# Patient Record
Sex: Male | Born: 1950 | Race: White | Hispanic: No | Marital: Married | Smoking: Current every day smoker
Health system: Southern US, Community
[De-identification: ages and names within clinical notes are randomized; demographics above are authoritative.]

## PROBLEM LIST (undated history)

## (undated) DIAGNOSIS — E119 Type 2 diabetes mellitus without complications: Secondary | ICD-10-CM

## (undated) DIAGNOSIS — Z9889 Other specified postprocedural states: Secondary | ICD-10-CM

## (undated) DIAGNOSIS — E669 Obesity, unspecified: Secondary | ICD-10-CM

## (undated) DIAGNOSIS — S8991XA Unspecified injury of right lower leg, initial encounter: Secondary | ICD-10-CM

## (undated) DIAGNOSIS — E785 Hyperlipidemia, unspecified: Secondary | ICD-10-CM

## (undated) DIAGNOSIS — F17200 Nicotine dependence, unspecified, uncomplicated: Secondary | ICD-10-CM

## (undated) DIAGNOSIS — T7840XA Allergy, unspecified, initial encounter: Secondary | ICD-10-CM

## (undated) DIAGNOSIS — H269 Unspecified cataract: Secondary | ICD-10-CM

## (undated) DIAGNOSIS — G4730 Sleep apnea, unspecified: Secondary | ICD-10-CM

## (undated) DIAGNOSIS — I10 Essential (primary) hypertension: Secondary | ICD-10-CM

## (undated) DIAGNOSIS — R011 Cardiac murmur, unspecified: Secondary | ICD-10-CM

## (undated) DIAGNOSIS — M51369 Other intervertebral disc degeneration, lumbar region without mention of lumbar back pain or lower extremity pain: Secondary | ICD-10-CM

## (undated) DIAGNOSIS — M5136 Other intervertebral disc degeneration, lumbar region: Secondary | ICD-10-CM

## (undated) DIAGNOSIS — J449 Chronic obstructive pulmonary disease, unspecified: Secondary | ICD-10-CM

## (undated) DIAGNOSIS — M1990 Unspecified osteoarthritis, unspecified site: Secondary | ICD-10-CM

## (undated) HISTORY — DX: Type 2 diabetes mellitus without complications: E11.9

## (undated) HISTORY — DX: Hyperlipidemia, unspecified: E78.5

## (undated) HISTORY — PX: APPENDECTOMY: SHX54

## (undated) HISTORY — DX: Allergy, unspecified, initial encounter: T78.40XA

## (undated) HISTORY — DX: Nicotine dependence, unspecified, uncomplicated: F17.200

## (undated) HISTORY — DX: Other intervertebral disc degeneration, lumbar region: M51.36

## (undated) HISTORY — DX: Unspecified osteoarthritis, unspecified site: M19.90

## (undated) HISTORY — DX: Unspecified cataract: H26.9

## (undated) HISTORY — PX: TONSILLECTOMY: SUR1361

## (undated) HISTORY — PX: EYE SURGERY: SHX253

## (undated) HISTORY — DX: Other intervertebral disc degeneration, lumbar region without mention of lumbar back pain or lower extremity pain: M51.369

## (undated) HISTORY — PX: BACK SURGERY: SHX140

## (undated) HISTORY — PX: NECK SURGERY: SHX720

## (undated) HISTORY — DX: Chronic obstructive pulmonary disease, unspecified: J44.9

## (undated) HISTORY — PX: SPINE SURGERY: SHX786

---

## 2000-05-31 ENCOUNTER — Inpatient Hospital Stay (HOSPITAL_COMMUNITY): Admission: EM | Admit: 2000-05-31 | Discharge: 2000-06-01 | Payer: Self-pay | Admitting: Emergency Medicine

## 2001-01-02 ENCOUNTER — Emergency Department (HOSPITAL_COMMUNITY): Admission: EM | Admit: 2001-01-02 | Discharge: 2001-01-02 | Payer: Self-pay | Admitting: Emergency Medicine

## 2001-02-26 ENCOUNTER — Emergency Department (HOSPITAL_COMMUNITY): Admission: EM | Admit: 2001-02-26 | Discharge: 2001-02-27 | Payer: Self-pay

## 2002-10-30 ENCOUNTER — Emergency Department (HOSPITAL_COMMUNITY): Admission: EM | Admit: 2002-10-30 | Discharge: 2002-10-30 | Payer: Self-pay | Admitting: Emergency Medicine

## 2002-10-30 ENCOUNTER — Encounter: Payer: Self-pay | Admitting: Emergency Medicine

## 2003-06-23 ENCOUNTER — Encounter: Admission: RE | Admit: 2003-06-23 | Discharge: 2003-06-23 | Payer: Self-pay | Admitting: Internal Medicine

## 2003-10-02 ENCOUNTER — Encounter (INDEPENDENT_AMBULATORY_CARE_PROVIDER_SITE_OTHER): Payer: Self-pay | Admitting: Specialist

## 2003-10-02 ENCOUNTER — Ambulatory Visit (HOSPITAL_COMMUNITY): Admission: RE | Admit: 2003-10-02 | Discharge: 2003-10-02 | Payer: Self-pay | Admitting: Gastroenterology

## 2006-07-17 ENCOUNTER — Emergency Department (HOSPITAL_COMMUNITY): Admission: EM | Admit: 2006-07-17 | Discharge: 2006-07-17 | Payer: Self-pay | Admitting: Emergency Medicine

## 2006-08-02 ENCOUNTER — Encounter: Admission: RE | Admit: 2006-08-02 | Discharge: 2006-08-02 | Payer: Self-pay | Admitting: Internal Medicine

## 2006-08-29 ENCOUNTER — Ambulatory Visit (HOSPITAL_COMMUNITY): Admission: RE | Admit: 2006-08-29 | Discharge: 2006-08-30 | Payer: Self-pay | Admitting: Neurosurgery

## 2007-11-22 ENCOUNTER — Encounter: Admission: RE | Admit: 2007-11-22 | Discharge: 2007-11-22 | Payer: Self-pay | Admitting: Internal Medicine

## 2008-01-22 ENCOUNTER — Ambulatory Visit: Payer: Self-pay | Admitting: Family Medicine

## 2009-08-11 ENCOUNTER — Emergency Department (HOSPITAL_COMMUNITY): Admission: EM | Admit: 2009-08-11 | Discharge: 2009-08-11 | Payer: Self-pay | Admitting: Emergency Medicine

## 2009-09-21 ENCOUNTER — Emergency Department (HOSPITAL_COMMUNITY): Admission: EM | Admit: 2009-09-21 | Discharge: 2009-09-21 | Payer: Self-pay | Admitting: Family Medicine

## 2010-05-10 ENCOUNTER — Encounter: Payer: Self-pay | Admitting: Family Medicine

## 2010-05-10 ENCOUNTER — Emergency Department (HOSPITAL_COMMUNITY): Admission: EM | Admit: 2010-05-10 | Discharge: 2010-05-10 | Payer: Self-pay | Admitting: Family Medicine

## 2010-05-10 ENCOUNTER — Ambulatory Visit (HOSPITAL_COMMUNITY): Admission: RE | Admit: 2010-05-10 | Discharge: 2010-05-10 | Payer: Self-pay | Admitting: Family Medicine

## 2010-05-10 ENCOUNTER — Ambulatory Visit: Payer: Self-pay | Admitting: Vascular Surgery

## 2010-12-09 NOTE — Op Note (Signed)
NAME:  Ricky Rios, Ricky Rios NO.:  [id number]   MEDICAL RECORD NO.:  [id number]          PATIENT TYPE:  OIB   LOCATION:  3013                         FACILITY:  [facility]   PHYSICIAN:  Hewitt Shorts, M.D.DATE OF BIRTH:  09/01/1950   DATE OF PROCEDURE:  08/29/2006  DATE OF DISCHARGE:                               OPERATIVE REPORT   PREOPERATIVE DIAGNOSES:  Left C7-T1 cervical disk herniation, cervical  spondylosis, cervical degenerative disk disease and cervical  radiculopathy.   POSTOPERATIVE DIAGNOSES:  Left C7-T1 cervical disk herniation, cervical  spondylosis, cervical degenerative disk disease and cervical  radiculopathy.   PROCEDURES:  Left C7-T1 posterior cervical laminotomy, foraminotomy and  microdiskectomy with microdissection.   SURGEON:  Hewitt Shorts, M.D.   ASSISTANT:  Payton Doughty, M.D.   ANESTHESIA:  General endotracheal.   INDICATIONS:  The patient is a 60 year old man, who presented with an  acute left C8 cervical radiculopathy, with weakness of the intrinsics  and grip of the left hand associated with discomfort, numbness and  tingling.  A decision was made to proceed with elective posterior  cervical diskectomy.   DESCRIPTION OF PROCEDURES:  The patient was brought to the operating  room and placed under general endotracheal anesthesia.  A central line  was placed by the anesthesia service, and then a 3-pin Mayfield  headholder was applied.  The patient was brought up to a sitting  position and the posterior aspect of the neck was prepped with Betadine  Soap and Solution and draped in a sterile fashion.  And then the midline  was infiltrated with 1% Xylocaine with epinephrine, and a midline  incision was made over the C7-T1 level and dissection was carried down  through the subcutaneous tissue.  Bipolar cautery and electrocautery  were used to maintain hemostasis.  Dissection was carried down to the  posterior cervical fascia, which  was incised on the left side of the  midline, and the paracervical musculature was dissected from the spinous  processes and laminae in a subperiosteal fashion.  An x-ray was taken  and the C1 interlaminar space identified.  We then placed a self-  retaining retractor.  The microscope was draped and the decompression  was performed using microdissection and microsurgical technique.  We  initially performed a laminotomy of the inferior aspect of C7 and the  superior aspect of T1.  We then carefully removed the ligamentum flavum  and we identified the thecal sac and exiting left C8 nerve root.  Using  a micro-nerve hook, we gently explored the central epidural space.  A  large disk herniation was identified.  We were able to incise the  remaining annular fibers and the disk fragment extruded and was removed  in a piecemeal fashion, with progressive decompression of the thecal sac  and exiting left C8 nerve root.  In the end, all loose fragments of disk  material were removed with good decompression of the thecal sac and  nerve roots.  And once the diskectomy was completed, we irrigated the  wound with Bacitracin solution.  Hemostasis was established with the use  of  Gelfoam soaked in thrombin.  However, all the Gelfoam was removed  prior to closure.  Once the decompression was complete and hemostasis  was established, we proceeded with closure.  The deep fascia was closed  with interrupted, undyed 0 Vicryl sutures.  The Scarpa fascia was closed  with interrupted, undyed 0 Vicryl sutures, and the subcutaneous and  subcuticular layers were closed with interrupted, inverted 2-0 and 3-0  undyed Vicryl sutures.  The skin edges were approximated with Dermabond.  The procedure was tolerated well.  The estimated blood loss was 40 mL.  Sponge counts were correct.  Following surgery, the patient was brought  back to a supine position, the 3-pin Mayfield headholder was removed,  and the patient was  then to be reversed from the anesthetic, to be  extubated and transferred to the recovery room for further care.      Hewitt Shorts, M.D.  Electronically Signed     RWN/MEDQ  D:  08/29/2006  T:  08/29/2006  Job:  [id number]

## 2010-12-09 NOTE — Op Note (Signed)
NAME:  Ricky Rios, Ricky Rios NO.:  [id number]   MEDICAL RECORD NO.:  [id number]                   PATIENT TYPE:  AMB   LOCATION:  ENDO                                 FACILITY:  [facility]   PHYSICIAN:  Bernette Redbird, M.D.                DATE OF BIRTH:  [birth year]   DATE OF PROCEDURE:  10/02/2003  DATE OF DISCHARGE:                                 OPERATIVE REPORT   PROCEDURE:  Colonoscopy with polypectomy and biopsy.   INDICATIONS FOR PROCEDURE:  A 60 year old for first time colon cancer  screening.  No risk factors or symptoms.   Three colon polyps removed.   DESCRIPTION OF PROCEDURE:  The nature, purpose and risk of the procedure had  been discussed with the patient who provided written consent. Sedation was  fentanyl 125 mcg and Versed 12.5 mg IV to the level of moderate sedation.  The patient was somewhat restless during the procedure and it is felt that  Diprivan might be a good agent to use for him in the future.   The Olympus adult video colonoscope was inserted following an unremarkable  digital rectal exam. The patient had a kink in the sigmoid region making  it difficult to get past that area and we had to turn him into the supine  position.  Eventually we got through that area but again it was quite  difficult to advance. The patient was restless and his big abdomen made it  difficult to get adequate external abdominal compression. Ultimately, with  the patient in the right lateral decubitus position, I was able to get the  tip of the scope to enter the cecum but not really reach the base of the  cecum which was never really well visualized during this exam. Pullback was  then performed. The quality of the prep was very good and it is felt that  all areas except the base of the cecum were fairly well seen, although at  certain points, the scope would slide back in an uncontrolled fashion. At  25 cm, there was a 7 mm semipedunculated polyp removed  by snare technique  with complete hemostasis and no evidence of excessive cautery.  There was a  3 mm sessile polyp removed by a couple of cold biopsies in the distal  rectum, while the scope was in the retroflexed position, and there was  another 2 mm polyp biopsied in the mid rectum and placed in the same jar.  Reinspection of the rectum was otherwise unremarkable.   The patient tolerated the procedure okay and there were no apparent  complications.   IMPRESSION:  1. Colon and rectal polyps removed as described above (211.3; 211.4).  2. Base of cecum not optimally seen.   PLAN:  Await pathology on the polyp with anticipated early colonoscopic  followup in view of the unseen base of cecum.  Bernette Redbird, M.D.    RB/MEDQ  D:  10/02/2003  T:  10/02/2003  Job:  [id number]   cc:   Olene Craven, M.D.  [address]  [address]  [place]  [place] [address]  Fax: [phone]

## 2010-12-09 NOTE — Discharge Summary (Signed)
[facility]  Patient:    MILBERN, DOESCHER                   MRN: [id number] Adm. Date:  [id number] Disc. Date: 06/01/00 Attending:  Junious Silk Dictator:   Abelino Derrick, P.A.C. [facility]                  Referring Physician Discharge [facility]  DISCHARGE DIAGNOSES: 1. Chest pain, probably noncardiac in origin with negative Cardiolite this    admission. 2. History of smoking.  HISTORY OF PRESENT ILLNESS:  The patient is a 60 year old white male with no history of hypertension or diabetes, but a family history of coronary artery disease and a two and a half pack a day smoker, who presented with left arm pain and chest tightness.  The symptoms were somewhat atypical for coronary disease, but he did have risk factors.  HOSPITAL COURSE:  He was admitted to telemetry and ruled out for an MI.  A Cardiolite study was done on June 01, 2000, and was negative for ischemia with an EF of 61%.  The patient was discharged late on June 01, 2000, and will follow up in the office in about two weeks.  LABORATORY DATA:  Sodium 144, potassium 4.2, BUN 11.  Hemoglobin 16, hematocrit 47.  INR 0.9.  CK-MBs and troponins were negative as noted.  The TSH was 4.511.  The chest x-ray showed no active disease with mild evidence of cardiomegaly.  DISCHARGE MEDICATIONS:  None.  DISPOSITION:  The patient is discharged in stable condition and will follow up with the office P.A. on June 22, 2000.  He does have some risk factors for coronary disease and we have urged him strongly to quit smoking.  We may want to check a lipid profile as an outpatient.  He has no primary care doctor. DD:  06/01/00 TD:  06/01/00 Job: [id number]

## 2011-04-24 HISTORY — PX: CARDIAC CATHETERIZATION: SHX172

## 2011-05-02 ENCOUNTER — Observation Stay (HOSPITAL_COMMUNITY)
Admission: EM | Admit: 2011-05-02 | Discharge: 2011-05-04 | Disposition: A | Discharge status: Home / Self Care | Payer: Self-pay | Attending: Internal Medicine | Admitting: Internal Medicine

## 2011-05-02 ENCOUNTER — Encounter (HOSPITAL_COMMUNITY): Payer: Self-pay

## 2011-05-02 ENCOUNTER — Inpatient Hospital Stay (HOSPITAL_COMMUNITY): Payer: Self-pay

## 2011-05-02 ENCOUNTER — Emergency Department (HOSPITAL_COMMUNITY): Payer: Self-pay

## 2011-05-02 DIAGNOSIS — G4733 Obstructive sleep apnea (adult) (pediatric): Secondary | ICD-10-CM | POA: Insufficient documentation

## 2011-05-02 DIAGNOSIS — E785 Hyperlipidemia, unspecified: Secondary | ICD-10-CM | POA: Insufficient documentation

## 2011-05-02 DIAGNOSIS — E119 Type 2 diabetes mellitus without complications: Secondary | ICD-10-CM | POA: Insufficient documentation

## 2011-05-02 DIAGNOSIS — M25559 Pain in unspecified hip: Secondary | ICD-10-CM | POA: Insufficient documentation

## 2011-05-02 DIAGNOSIS — R110 Nausea: Secondary | ICD-10-CM | POA: Insufficient documentation

## 2011-05-02 DIAGNOSIS — R0789 Other chest pain: Principal | ICD-10-CM | POA: Insufficient documentation

## 2011-05-02 DIAGNOSIS — I10 Essential (primary) hypertension: Secondary | ICD-10-CM | POA: Insufficient documentation

## 2011-05-02 DIAGNOSIS — R0602 Shortness of breath: Secondary | ICD-10-CM | POA: Insufficient documentation

## 2011-05-02 HISTORY — DX: Obesity, unspecified: E66.9

## 2011-05-02 HISTORY — DX: Essential (primary) hypertension: I10

## 2011-05-02 HISTORY — DX: Sleep apnea, unspecified: G47.30

## 2011-05-02 LAB — BASIC METABOLIC PANEL
BUN: 14 mg/dL (ref 6–23)
CO2: 24 mEq/L (ref 19–32)
CO2: 27 mEq/L (ref 19–32)
Calcium: 9.8 mg/dL (ref 8.4–10.5)
Chloride: 101 mEq/L (ref 96–112)
Chloride: 102 mEq/L (ref 96–112)
Creatinine, Ser: 0.88 mg/dL (ref 0.50–1.35)
Creatinine, Ser: 0.96 mg/dL (ref 0.50–1.35)
GFR calc Af Amer: >90 mL/min (ref >90)
GFR calc non Af Amer: >90 mL/min (ref >90)
Glucose, Bld: 150 mg/dL — ABNORMAL HIGH (ref 70–99)
Potassium: 4 mEq/L (ref 3.5–5.1)
Sodium: 137 mEq/L (ref 135–145)

## 2011-05-02 LAB — CBC
Hemoglobin: 13.1 g/dL (ref 13.0–17.0)
Hemoglobin: 14.3 g/dL (ref 13.0–17.0)
MCHC: 34.5 g/dL (ref 30.0–36.0)
MCHC: 34.6 g/dL (ref 30.0–36.0)
MCV: 87.6 fL (ref 78.0–100.0)
WBC: 8.7 10*3/uL (ref 4.0–10.5)

## 2011-05-02 LAB — TSH: TSH: 3.551 u[IU]/mL (ref 0.350–4.500)

## 2011-05-02 LAB — APTT: aPTT: 33 seconds (ref 24–37)

## 2011-05-02 LAB — LIPID PANEL
Cholesterol: 210 mg/dL — ABNORMAL HIGH (ref 0–200)
Total CHOL/HDL Ratio: 7.2 RATIO
Triglycerides: 224 mg/dL — ABNORMAL HIGH (ref <150)
VLDL: 45 mg/dL — ABNORMAL HIGH (ref 0–40)

## 2011-05-02 LAB — PROTIME-INR
INR: 1.1 (ref 0.00–1.49)
Prothrombin Time: 14.4 seconds (ref 11.6–15.2)

## 2011-05-02 LAB — DIFFERENTIAL
Basophils Absolute: 0.1 10*3/uL (ref 0.0–0.1)
Eosinophils Absolute: 0.5 10*3/uL (ref 0.0–0.7)
Lymphocytes Relative: 44 % (ref 12–46)
Lymphs Abs: 3.8 10*3/uL (ref 0.7–4.0)
Neutro Abs: 3.9 10*3/uL (ref 1.7–7.7)

## 2011-05-02 LAB — RAPID URINE DRUG SCREEN, HOSP PERFORMED
Amphetamines: NOT DETECTED
Barbiturates: NOT DETECTED
Benzodiazepines: NOT DETECTED
Cocaine: NOT DETECTED
Tetrahydrocannabinol: NOT DETECTED

## 2011-05-02 LAB — D-DIMER, QUANTITATIVE: D-Dimer, Quant: 1.42 ug/mL-FEU — ABNORMAL HIGH (ref 0.00–0.48)

## 2011-05-02 LAB — CK TOTAL AND CKMB (NOT AT ARMC)
CK, MB: 3.2 ng/mL (ref 0.3–4.0)
Relative Index: 2.5 (ref 0.0–2.5)
Relative Index: 2.9 — ABNORMAL HIGH (ref 0.0–2.5)
Total CK: 113 U/L (ref 7–232)

## 2011-05-02 LAB — GLUCOSE, CAPILLARY
Glucose-Capillary: 164 mg/dL — ABNORMAL HIGH (ref 70–99)
Glucose-Capillary: 133 mg/dL — ABNORMAL HIGH (ref 70–99)

## 2011-05-02 LAB — HEPATIC FUNCTION PANEL

## 2011-05-02 LAB — TROPONIN I
Troponin I: <0.3 ng/mL (ref <0.30)
Troponin I: <0.3 ng/mL (ref <0.30)

## 2011-05-02 MED ORDER — IOHEXOL 300 MG/ML  SOLN
100.0000 mL | Freq: Once | INTRAMUSCULAR | Status: AC | PRN
  Administered 2011-05-02: 100 mL via INTRAVENOUS

## 2011-05-03 LAB — BASIC METABOLIC PANEL
CO2: 23 mEq/L (ref 19–32)
Chloride: 102 mEq/L (ref 96–112)
Sodium: 138 mEq/L (ref 135–145)

## 2011-05-03 LAB — GLUCOSE, CAPILLARY
Glucose-Capillary: 151 mg/dL — ABNORMAL HIGH (ref 70–99)
Glucose-Capillary: 140 mg/dL — ABNORMAL HIGH (ref 70–99)
Glucose-Capillary: 106 mg/dL — ABNORMAL HIGH (ref 70–99)
Glucose-Capillary: 91 mg/dL (ref 70–99)

## 2011-05-03 LAB — CBC
HCT: 39.4 % (ref 39.0–52.0)
MCV: 88.1 fL (ref 78.0–100.0)
RBC: 4.47 MIL/uL (ref 4.22–5.81)
RDW: 15.1 % (ref 11.5–15.5)
WBC: 7.6 10*3/uL (ref 4.0–10.5)

## 2011-05-04 LAB — CBC
HCT: 38.8 % — ABNORMAL LOW (ref 39.0–52.0)
MCV: 88 fL (ref 78.0–100.0)
Platelets: 112 10*3/uL — ABNORMAL LOW (ref 150–400)
RBC: 4.41 MIL/uL (ref 4.22–5.81)
WBC: 7 10*3/uL (ref 4.0–10.5)

## 2011-05-04 LAB — BASIC METABOLIC PANEL
BUN: 13 mg/dL (ref 6–23)
CO2: 27 mEq/L (ref 19–32)
Chloride: 104 mEq/L (ref 96–112)
Creatinine, Ser: 0.92 mg/dL (ref 0.50–1.35)

## 2011-05-08 NOTE — Discharge Summary (Signed)
NAME:  Ricky Rios, Ricky Rios NO.:  [id number]  MEDICAL RECORD NO.:  [id number]  LOCATION:  2502                         FACILITY:  [facility]  PHYSICIAN:  Thad Ranger, MD       DATE OF BIRTH:  Aug 15, 1950  DATE OF ADMISSION:  05/02/2011 DATE OF DISCHARGE:                        DISCHARGE SUMMARY - REFERRING   PRIMARY CARE PHYSICIAN:  The patient does not remember the name of his primary care physician at this time.  FINAL DISCHARGE DIAGNOSES: 1. Atypical chest pain, likely secondary to acute     esophagitis/gastroesophageal reflux disease. 2. Cardiac catheterization on May 03, 2011.  Negative for any     obstructive coronary artery disease. 3. Hypertension. 4. Obstructive sleep apnea. 5. Diabetes mellitus. 6. Hyperlipidemia.  CONSULTATIONS:  Cardiology, Nanetta Batty, MD  FINAL DISCHARGE MEDICATIONS: 1. Aspirin 81 mg p.o. daily. 2. Zetia 10 mg p.o. daily. 3. Glipizide 5 mg p.o. b.i.d. with meals. 4. Omeprazole 40 mg p.o. daily. 5. Lisinopril/hydrochlorothiazide 10/12.5 mg p.o. daily.  BRIEF HISTORY OF PRESENT ILLNESS:  At the time of admission, Mr. More is a 60 year old male with a past medical history of type 2 diabetes, hypertension, but not taking any of his medications presented to the emergency room with chest pain.  The patient stated that the pain started around 11:15, the night of admission and he was lying on the bed.  The patient stated the chest pain was accompanied with some shortness of breath and nausea, not positional.  He did also relates some shortness of breath on exertion and was admitted for further workup.  RADIOLOGICAL DATA: 1. Chest x-ray, two-view, May 02, 2011, possible venous     hypertension, but no edema.  Question some bronchial thickening.     No pneumonia. 2. CT angiogram of the chest on May 02, 2011, showed no evidence of     acute abnormality.  No evidence of pulmonary embolism or thoracic     aortic aneurysm.   Equivocal mild nodularity of the hepatic contour.     Cirrhosis not entirely excluded, clinically upper limits normal in     size. 3. Cardiac catheterization on May 03, 2011 was normal,     nonobstructive chest pain.  BRIEF HOSPITALIZATION COURSE:  [name] is a 61 year old male, who was admitted with atypical chest pain. 1. Chest pain.  The patient was admitted to the telemonitor floor and     Cardiology was consulted.  CT angiogram was negative for any     pulmonary embolism.  The patient underwent cardiac catheterization     on May 03, 2011, which showed normal left ventricular function,     normal coronary arteries with EF of greater than 60% without focal     motion abnormalities.  Per Cardiology, likely his chest pain is     noncardiac and the patient was placed on anti reflex measures as     well.  He was also counseled strongly to follow up with his primary     care physician within the next 7-10 days for hospital followup and     with Cardiology as needed if he has any recurrent episodes of any     chest pain. 2. Diabetes  mellitus.  HbA1c was 6.9 with a mean plasma glucose of     151.  The patient was also noncompliant with his medications.  He     was placed on glipizide 5 mg p.o. b.i.d.  The patient will be     discharged home today.  He is currently cleared by Cardiology for     discharge.  PHYSICAL EXAMINATION:  VITAL SIGNS:  At the time of dictation, temperature 97.9, pulse 66, respirations 17, blood pressure 126/65, O2 sats 97% on room air. GENERAL:  The patient is alert, awake, and oriented x3.  Not in acute distress. HEENT:  Normal sclerae and conjunctivae.  Pupils equal, round, and reactive to light and accommodation.  EOMI. NECK:  Supple.  No lymphadenopathy. CVS:  S1 and S2 clear. CHEST:  Clear to auscultation bilaterally. ABDOMEN:  Soft, nontender, nondistended.  Normal bowel sounds. EXTREMITIES:  No cyanosis, clubbing, or edema noted in upper or  lower extremities bilaterally.  The patient was also counseled to follow up with his primary care physician within the next 7-10 days for the followup.  DISCHARGE TIME:  35 minutes.     Thad Ranger, MD     RR/MEDQ  D:  05/04/2011  T:  05/04/2011  Job:  [id number]  cc:   Nanetta Batty, M.D.  Electronically Signed by Andres Labrum RAI  on 05/08/2011 02:19:53 PM

## 2011-05-24 NOTE — H&P (Signed)
NAME:  Ricky Rios, Ricky Rios NO.:  [id number]  MEDICAL RECORD NO.:  [id number]  LOCATION:  [facility]                         FACILITY:  [facility]  PHYSICIAN:  Hartley Barefoot, MD    DATE OF BIRTH:  [birth year]  DATE OF ADMISSION:  05/02/2011 DATE OF DISCHARGE:                             HISTORY & PHYSICAL   CHIEF COMPLAINT:  Chest pain.  HISTORY OF PRESENT ILLNESS:  This is a 60 year old with past medical history of diabetes, currently on diet; hypertension not taking his blood pressure medications, said that he is not able to afford it; who presents to the emergency department complaining of chest pain.  The patient related that the pain started like around 11:15 the night of admission.  Chest pain started suddenly.  He was lying down on the side of the bed and he started to have chest pain in the middle of the chest 8/10 in intensity.  He changed position and that did not improve the chest pain.  He started to walk around to see if that relieves the pain and it did not relieve his pain.  He took Gas-X without any relief.  He had the chest pain on and off, lasts for 10 minutes.  Pain was relieved in the emergency department after medication was given.  He described the pain as sharp, burning in quality.  He did not feel like he had indigestion.  The chest pain was accompanied with some shortness of breath and nausea.  He relates some shortness of breath on exertion for the last month.  He denies any chest pain on exertion.  He has chronic bilateral lower extremity swelling.  ALLERGIES:  SULFA.  He does not remember what was the reaction.  He also has intolerance to LIPITOR and ZOCOR, he gets shakiness.  MEDICATIONS: 1. He is supposed to be taking lisinopril and hydrochlorothiazide     10/12.5 mg p.o. daily, but he has not been taking this medication     because he is not able to afford it. 2. Metformin a 1000 mg p.o. b.i.d.  He said that his diabetes has been     well  controlled and he has not required a metformin. 3. He is supposed to be taking Zetia, but he is not able to afford     that.  PAST MEDICAL HISTORY: 1. Hypertension. 2. Diabetes. 3. Sleep apnea. 4. Hyperlipidemia.  SOCIAL HISTORY:  He is a current smoker.  He smokes 1 pack per day, and he has been smoking for the last 30 years.  He denies alcohol or recreational drugs.  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  FAMILY HISTORY: 1. Mother deceased, complication of Alzheimer's. 2. Father deceased, died of pneumonia and had a prior history of     stroke.  His father had history of heart disease, unknown age of     that. 3. He has a brother, who died of multiple sclerosis.  REVIEW OF SYSTEMS:  He has some cramps all over occasionally.  PHYSICAL EXAMINATION: VITAL SIGNS:  Pulse 77, respirations 16, sat 98% on room air, blood pressure 148/77, temp 98.3, respirations 14. GENERAL:  The patient lying in bed, in no acute distress, obese. HEENT:  Head atraumatic, normocephalic.  Eyes anicteric.  Pupils equal, reactive to light.  Extraocular muscle intact. NECK:  Supple.  No carotid bruits. CARDIOVASCULAR:  S1, S2.  Regular rhythm and rate.  No rubs, murmurs, or gallops. LUNGS:  Bilateral good air movement.  No wheezing, crackles, or rhonchi. ABDOMEN:  Obese.  Bowel sounds present.  Mild epigastric tenderness.  No guarding.  No rigidity. NEUROLOGIC:  Nonfocal.  ADMISSION LABORATORY DATA:  Sodium 137 potassium 4.0 chloride 101, bicarb 24 BUN 14, creatinine 0.96 glucose 150.  White blood cell 8.7, hemoglobin 14.3, hematocrit 41, platelets 121.  ANC 3.9.  Troponin 0.00.  EKG, normal sinus rhythm.  Left anterior fascicular block.  No ST- segment elevation or depression.  Chest x-ray, possible venous hypertension, but no edema.  Question of some bronchial thickening as well.  No pneumonia.  ASSESSMENT AND PLAN: This is a 60 year old that presents complaining of chest pain, has multiple risk  factors. 1. Chest pain, rule out acute coronary syndrome.  We will admit the     patient to telemetry.  We will cycle cardiac enzymes.  We will     repeat EKG in the morning.  We will start nitroglycerin p.r.n. for     chest pain.  Aspirin.  Smoking cessation consult.  We will check 2D     echo.  We will consider cardiology consult due to new left anterior     fascicular block.  I will check a D-dimer.  If positive, we will     check a CT angio to rule out PE. 2. Hypertension.  Hydralazine p.r.n.  We will start low-dose beta-     blocker.  We can consider to restart his home medications in the     morning. 3. Sleep apnea.  Continue with CPAP. 4. Diabetes.  We will check hemoglobin A1c and start sliding scale     insulin.  Hold metformin in case the patient require contrast. 5. For DVT prophylaxis, heparin.     Hartley Barefoot, MD     BR/MEDQ  D:  05/02/2011  T:  05/02/2011  Job:  [id number]  Electronically Signed by Hartley Barefoot MD on 05/24/2011 08:17:48 PM

## 2011-05-26 NOTE — Cardiovascular Report (Signed)
NAME:  Ricky Rios, Ricky Rios NO.:  [id number]  MEDICAL RECORD NO.:  [id number]  LOCATION:  2502                         FACILITY:  [facility]  PHYSICIAN:  Nanetta Batty, M.D.   DATE OF BIRTH:  [birth year]  DATE OF PROCEDURE: DATE OF DISCHARGE:                           CARDIAC CATHETERIZATION   [name] is a 60 year old moderately overweight Caucasian male with positive cardiac risk factors including hypertension, type 2 diabetes, as well as family history, obstructive sleep apnea.  He was admitted with unstable angina, rule out for myocardial infarction.  He also complains of symptoms of buttock and hip claudication.  He presents now for diagnostic coronary arteriography via the right radial approach to define his anatomy and rule out ischemic etiology.  PROCEDURE DESCRIPTION:  The patient was brought to the second floor [place] cardiac cath lab in the postabsorptive state.  He was premedicated with p.o.  Valium, IV Versed, and fentanyl.  His right wrist was prepped and shaved in the usual sterile fashion.  Xylocaine 1% was used for local anesthesia.  A 5-French sheath was inserted into the right radial artery using standard back wall pullback technique.  10 mL of radial cocktail was administered via the side-arm sheath as well as 5000 units of heparin intravenously.  5-French TIG catheter and 5-French pigtail catheter were used for selective coronary angiography, left ventriculography, and distal abdominal aortography.  Visipaque dye was used for the entirety of the case.  Retrograde aortic, left ventricular pullback pressures were recorded.  HEMODYNAMICS: 1. Aortic systolic pressure 108, diastolic pressure 75. 2. Left ventricular systolic pressure 111, end-diastolic pressure 21.  SELECTIVE CORONARY ANGIOGRAPHY: 1. Left main normal. 2. LAD normal. 3. Left circumflex normal. 4. Right coronary artery was dominant and normal. 5. Left ventriculography; RAO  left ventriculogram was performed using     25 mL of Visipaque dye at 12 mL/second.  The overall LVEF was     estimated at greater than 60% without focal wall motion     abnormalities. 6. Distal abdominal aortography; distal abdominal aortogram was     performed using 20 mL of Visipaque dye at 20 mL/second.  The renal     arteries were widely patent.  The infrarenal abdominal aorta and     iliac bifurcation appeared free of significant atherosclerotic     changes.  IMPRESSION:  [name] had essentially normal coronary arteries, normal left ventricular function, as well as a normal abdominal aorta. I believe his chest pain is noncardiac and his hip and buttock discomfort is not related to claudication.  He will be treated empirically with antireflux measures.  The sheath was removed and a TR band was placed on the right wrist to achieve patent hemostasis __________.  He left the lab in stable condition.  The TR band will be removed actually over the next 2 hours. He is stable to go home either later tonight or tomorrow morning.  I have counseled him about the importance of cardiac risk factor modification including smoking cessation.     Nanetta Batty, M.D.     JB/MEDQ  D:  05/03/2011  T:  05/04/2011  Job:  [id number]  cc:   [facility]  Electronically Signed by Nanetta Batty M.D. on 05/26/2011 05:26:06 PM

## 2011-11-22 ENCOUNTER — Encounter (HOSPITAL_COMMUNITY): Payer: Self-pay | Admitting: *Deleted

## 2011-11-22 ENCOUNTER — Emergency Department (INDEPENDENT_AMBULATORY_CARE_PROVIDER_SITE_OTHER)
Admission: EM | Admit: 2011-11-22 | Discharge: 2011-11-22 | Disposition: A | Discharge status: Home / Self Care | Payer: Self-pay | Source: Home / Self Care | Attending: Emergency Medicine | Admitting: Emergency Medicine

## 2011-11-22 DIAGNOSIS — S91309A Unspecified open wound, unspecified foot, initial encounter: Secondary | ICD-10-CM

## 2011-11-22 DIAGNOSIS — S91302A Unspecified open wound, left foot, initial encounter: Secondary | ICD-10-CM

## 2011-11-22 DIAGNOSIS — W268XXA Contact with other sharp object(s), not elsewhere classified, initial encounter: Secondary | ICD-10-CM

## 2011-11-22 LAB — GLUCOSE, CAPILLARY: Glucose-Capillary: 86 mg/dL (ref 70–99)

## 2011-11-22 MED ORDER — TETANUS-DIPHTH-ACELL PERTUSSIS 5-2.5-18.5 LF-MCG/0.5 IM SUSP
INTRAMUSCULAR | Status: AC
  Filled 2011-11-22: qty 0.5

## 2011-11-22 MED ORDER — BACITRACIN 500 UNIT/GM EX OINT
1.0000 "application " | TOPICAL_OINTMENT | Freq: Once | CUTANEOUS | Status: AC
  Administered 2011-11-22: 1 via TOPICAL

## 2011-11-22 MED ORDER — TETANUS-DIPHTH-ACELL PERTUSSIS 5-2.5-18.5 LF-MCG/0.5 IM SUSP
0.5000 mL | Freq: Once | INTRAMUSCULAR | Status: AC
  Administered 2011-11-22: 0.5 mL via INTRAMUSCULAR

## 2011-11-22 NOTE — Discharge Instructions (Signed)
You must take your blood pressure medications on a regular basis. Decrease your salt intake. Diet, weight loss and exercise will lower your blood pressure significantly. It is important to keep your blood pressure under good control, as having a elevated for prolonged periods of time significantly increases your risk of stroke, heart attacks, kidney damage, eye damage, and other problems.Return immediately to the ER if you start having chest pain, headache, problems seeing, problems talking, problems walking, if you feel like you're about to pass out, if you do pass out, if you have a seizure, or for any other concerns. Very clean, dry, white sock, and keep the area covered with a bandage with bacitracin or another antibiotic cream until this heals. Return for any signs of infection. Followup with one of the primary care resources listed below.  Go to www.goodrx.com to look up your medications. This will give you a list of where you can find your prescriptions at the most affordable prices.   RESOURCE GUIDE   No Primary Care Doctor Call [facility]  [phone] Other agencies that provide inexpensive medical care    [facility]  [phone]    [facility]  [phone]    [facility]  [phone] [facility] [phone]   [address] [place] [address] [place], [place] [address]    [facility]  [facility]     [employer]                          [facility] Dept. [address] [place]                       [address]      [address] [place] [address]   [phone] (After Hours)

## 2011-11-22 NOTE — ED Provider Notes (Signed)
History     CSN: [id number]  Arrival date & time 11/22/11  1212   First MD Initiated Contact with Patient 11/22/11 1245      Chief Complaint  Patient presents with  . Puncture Wound    (Consider location/radiation/quality/duration/timing/severity/associated sxs/prior treatment) HPI Comments: Patient reports catching the bottom of his left foot on a nail yesterday, sustaining a avulsion to the plantar aspect of his foot. He irrigated it, has been applying bacitracin, and wearing socks to protect it. No numbness, paresthesias, swelling, redness, purulent drainage, fevers, foreign body sensation. Denies any other injury to his foot. Tetanus unknown. Patient has a history of diabetes, and is trying to control it on his own with diet. Has not been taking his glipizide but states he does check his sugars.  ROS as noted in HPI. All other ROS negative.   Patient is a 61 y.o. male presenting with foot injury. The history is provided by the patient. No language interpreter was used.  Foot Injury     Past Medical History  Diagnosis Date  . Diabetes mellitus   . Hypertension   . Obesity   . Sleep apnea   . Chest pain     Diagnostic cardiac cath October 2012. Normal coronary arteries, left ventricular function    Past Surgical History  Procedure Date  . Cardiac catheterization Oct 2012    nml coronary arteries, aorta, LV fcn    History reviewed. No pertinent family history.  History  Substance Use Topics  . Smoking status: Current Everyday Smoker  . Smokeless tobacco: Not on file  . Alcohol Use: No      Review of Systems  Allergies  Lipitor; Sulfa antibiotics; and Zocor  Home Medications   Current Outpatient Rx  Name Route Sig Dispense Refill  . EZETIMIBE 10 MG PO TABS Oral Take 10 mg by mouth daily.    Marland Kitchen GLIPIZIDE 5 MG PO TABS Oral Take 5 mg by mouth 2 (two) times daily before a meal.    . LISINOPRIL-HYDROCHLOROTHIAZIDE 10-12.5 MG PO TABS Oral Take 1 tablet by mouth  daily.      BP 162/92  Pulse 73  Temp(Src) 97.6 F (36.4 C) (Oral)  Resp 18  SpO2 96%  Physical Exam  Nursing note and vitals reviewed. Constitutional: He is oriented to person, place, and time. He appears well-developed and well-nourished.  HENT:  Head: Normocephalic and atraumatic.  Eyes: Conjunctivae and EOM are normal.  Neck: Normal range of motion.  Cardiovascular: Normal rate.   Pulmonary/Chest: Effort normal. No respiratory distress.  Abdominal: He exhibits no distension.  Musculoskeletal: Normal range of motion.       1 cm superficial avulsion laceration to the dorsal aspect of his left foot. No active bleeding. No other injury to his foot. Pt able to move all toes actively.  Sensation grossly intact. No bony tenderness, swelling, deformity. DP 2+.  Neurological: He is alert and oriented to person, place, and time.  Skin: Skin is warm and dry.  Psychiatric: He has a normal mood and affect. His behavior is normal.    ED Course  Irrigation and debridement Date/Time: 11/22/2011 2:56 PM Performed by: Luiz Blare Authorized by: Luiz Blare Consent: Verbal consent obtained. Risks and benefits: risks, benefits and alternatives were discussed Consent given by: patient Patient understanding: patient states understanding of the procedure being performed Patient consent: the patient's understanding of the procedure matches consent given Required items: required blood products, implants, devices, and special equipment available  Patient identity confirmed: verbally with patient Time out: Immediately prior to procedure a "time out" was called to verify the correct patient, procedure, equipment, support staff and site/side marked as required. Local anesthesia used: no Patient tolerance: Patient tolerated the procedure well with no immediate complications. Comments: Scrubbed wound with chlorhexidine scrub. Debrided all devitalized tissue. Explored the wound with  adequate hemostasis. No puncture wounds seen. No foreign bodies visualized. Apply bacitracin, sterile nonstick dressing.   (including critical care time)   Labs Reviewed  GLUCOSE, CAPILLARY   No results found.   1. Avulsion of skin of foot, left, initial encounter       MDM  Previous records, labs reviewed. Additional medical history obtained. Debrided wound. No apparent retained foreign bodies. Scrubbed foot out with chlorhexidine scrub. No apparent puncture wound. Deferring x-ray today. Discussed signs/symptoms of infection, when to return. blood pressure noted. Patient is currently asymptomatic. He has not taken his medication in 4 days. discussed importance of taking blood pressure medication a regular basis.  Discussed signs symptoms of hypertensive emergency and when to go to the ED. We'll refer to primary care resources. Patient to continue checking his blood sugar. he agrees with plan.  Luiz Blare, MD 11/22/11 [id number]

## 2011-11-22 NOTE — ED Notes (Signed)
Pt  Reports  He  Stepped on a  Nail     yest  He  Is  Unsure  Of  His  Last  Tetanus  Shot       He  Is  alsoa  Diabetic  Who  Is  Non  Compliant

## 2012-01-17 ENCOUNTER — Emergency Department (HOSPITAL_COMMUNITY): Payer: Self-pay

## 2012-01-17 ENCOUNTER — Emergency Department (HOSPITAL_COMMUNITY)
Admission: EM | Admit: 2012-01-17 | Discharge: 2012-01-17 | Disposition: A | Discharge status: Home / Self Care | Payer: Self-pay | Attending: Emergency Medicine | Admitting: Emergency Medicine

## 2012-01-17 ENCOUNTER — Encounter (HOSPITAL_COMMUNITY): Payer: Self-pay | Admitting: *Deleted

## 2012-01-17 DIAGNOSIS — M5430 Sciatica, unspecified side: Secondary | ICD-10-CM | POA: Insufficient documentation

## 2012-01-17 DIAGNOSIS — Z79899 Other long term (current) drug therapy: Secondary | ICD-10-CM | POA: Insufficient documentation

## 2012-01-17 DIAGNOSIS — E119 Type 2 diabetes mellitus without complications: Secondary | ICD-10-CM | POA: Insufficient documentation

## 2012-01-17 DIAGNOSIS — I10 Essential (primary) hypertension: Secondary | ICD-10-CM | POA: Insufficient documentation

## 2012-01-17 DIAGNOSIS — M25559 Pain in unspecified hip: Secondary | ICD-10-CM | POA: Insufficient documentation

## 2012-01-17 DIAGNOSIS — M1710 Unilateral primary osteoarthritis, unspecified knee: Secondary | ICD-10-CM | POA: Insufficient documentation

## 2012-01-17 MED ORDER — PREDNISONE 10 MG PO TABS: 20.0000 mg | ORAL_TABLET | Freq: Every day | ORAL | Status: DC

## 2012-01-17 MED ORDER — OXYCODONE-ACETAMINOPHEN 5-325 MG PO TABS: 2.0000 | ORAL_TABLET | ORAL | Status: AC | PRN

## 2012-01-17 MED ORDER — IBUPROFEN 600 MG PO TABS: 600.0000 mg | ORAL_TABLET | Freq: Four times a day (QID) | ORAL | Status: AC | PRN

## 2012-01-17 NOTE — Discharge Instructions (Signed)
Sciatica Sciatica is a weakness and/or changes in sensation (tingling, jolts, hot and cold, numbness) along the path the sciatic nerve travels. Irritation or damage to lumbar nerve roots is often also referred to as lumbar radiculopathy.  Lumbar radiculopathy (Sciatica) is the most common form of this problem. Radiculopathy can occur in any of the nerves coming out of the spinal cord. The problems caused depend on which nerves are involved. The sciatic nerve is the large nerve supplying the branches of nerves going from the hip to the toes. It often causes a numbness or weakness in the skin and/or muscles that the sciatic nerve serves. It also may cause symptoms (problems) of pain, burning, tingling, or electric shock-like feelings in the path of this nerve. This usually comes from injury to the fibers that make up the sciatic nerve. Some of these symptoms are low back pain and/or unpleasant feelings in the following areas:  From the mid-buttock down the back of the leg to the back of the knee.   And/or the outside of the calf and top of the foot.   And/or behind the inner ankle to the sole of the foot.  CAUSES   Herniated or slipped disc. Discs are the little cushions between the bones in the back.   Pressure by the piriformis muscle in the buttock on the sciatic nerve (Piriformis Syndrome).   Misalignment of the bones in the lower back and buttocks (Sacroiliac Joint Derangement).   Narrowing of the spinal canal that puts pressure on or pinches the fibers that make up the sciatic nerve.   A slipped vertebra that is out of line with those above or beneath it.   Abnormality of the nervous system itself so that nerve fibers do not transmit signals properly, especially to feet and calves (neuropathy).   Tumor (this is rare).  Your caregiver can usually determine the cause of your sciatica and begin the treatment most likely to help you. TREATMENT  Taking over-the-counter painkillers, physical  therapy, rest, exercise, spinal manipulation, and injections of anesthetics and/or steroids may be used. Surgery, acupuncture, and Yoga can also be effective. Mind over matter techniques, mental imagery, and changing factors such as your bed, chair, desk height, posture, and activities are other treatments that may be helpful. You and your caregiver can help determine what is best for you. With proper diagnosis, the cause of most sciatica can be identified and removed. Communication and cooperation between your caregiver and you is essential. If you are not successful immediately, do not be discouraged. With time, a proper treatment can be found that will make you comfortable. HOME CARE INSTRUCTIONS   If the pain is coming from a problem in the back, applying ice to that area for 15 to 20 minutes, 3 to 4 times per day while awake, may be helpful. Put the ice in a plastic bag. Place a towel between the bag of ice and your skin.   You may exercise or perform your usual activities if these do not aggravate your pain, or as suggested by your caregiver.   Only take over-the-counter or prescription medicines for pain, discomfort, or fever as directed by your caregiver.   If your caregiver has given you a follow-up appointment, it is very important to keep that appointment. Not keeping the appointment could result in a chronic or permanent injury, pain, and disability. If there is any problem keeping the appointment, you must call back to this facility for assistance.  SEEK IMMEDIATE MEDICAL CARE   IF:   You experience loss of control of bowel or bladder.   You have increasing weakness in the trunk, buttocks, or legs.   There is numbness in any areas from the hip down to the toes.   You have difficulty walking or keeping your balance.   You have any of the above, with fever or forceful vomiting.  Document Released: 07/04/2001 Document Revised: 06/29/2011 Document Reviewed: 02/21/2008 [facility] Patient  Information 2012 [place], [place].   Back Exercises Back exercises help treat and prevent back injuries. The goal is to increase your strength in your belly (abdominal) and back muscles. These exercises can also help with flexibility. Start these exercises when told by your doctor. HOME CARE Back exercises include: Pelvic Tilt.  Lie on your back with your knees bent. Tilt your pelvis until the lower part of your back is against the floor. Hold this position 5 to 10 sec. Repeat this exercise 5 to 10 times.  Knee to Chest.  Pull 1 knee up against your chest and hold for 20 to 30 seconds. Repeat this with the other knee. This may be done with the other leg straight or bent, whichever feels better. Then, pull both knees up against your chest.  Sit-Ups or Curl-Ups.  Bend your knees 90 degrees. Start with tilting your pelvis, and do a partial, slow sit-up. Only lift your upper half 30 to 45 degrees off the floor. Take at least 2 to 3 seonds for each sit-up. Do not do sit-ups with your knees out straight. If partial sit-ups are difficult, simply do the above but with only tightening your belly (abdominal) muscles and holding it as told.  Hip-Lift.  Lie on your back with your knees flexed 90 degrees. Push down with your feet and shoulders as you raise your hips 2 inches off the floor. Hold for 10 seconds, repeat 5 to 10 times.  Back Arches.  Lie on your stomach. Prop yourself up on bent elbows. Slowly press on your hands, causing an arch in your low back. Repeat 3 to 5 times.  Shoulder-Lifts.  Lie face down with arms beside your body. Keep hips and belly pressed to floor as you slowly lift your head and shoulders off the floor.  Do not overdo your exercises. Be careful in the beginning. Exercises may cause you some mild back discomfort. If the pain lasts for more than 15 minutes, stop the exercises until you see your doctor. Improvement with exercise for back problems is slow.  Document Released:  08/12/2010 Document Revised: 06/29/2011 Document Reviewed: 08/12/2010 [facility] Patient Information 2012 [place], [place].

## 2012-01-17 NOTE — ED Notes (Signed)
Reports right hip pain since Thursday, now radiating into his right groin. Denies injury to hip or back.

## 2012-01-17 NOTE — ED Provider Notes (Signed)
History    This chart was scribed for Nelia Shi, MD, MD by Smitty Pluck. The patient was seen in room TR07C and the patient's care was started at 12:50PM.   CSN: [id number]  Arrival date & time 01/17/12  1135   First MD Initiated Contact with Patient 01/17/12 1245      Chief Complaint  Patient presents with  . Hip Pain    (Consider location/radiation/quality/duration/timing/severity/associated sxs/prior treatment) Patient is a 61 y.o. male presenting with hip pain. The history is provided by the patient.  Hip Pain   Ricky Rios is a 61 y.o. male who presents to the Emergency Department complaining of moderate right hip pain onset 6 days ago. Pain is sharp. Pain is aggravated by movement. Pt reports hx of back and neck operation. Denies hip problems in past. 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 Pain has been constant since onset. Denies radiation. Denies numbness.   Past Medical History  Diagnosis Date  . Diabetes mellitus   . Hypertension   . Obesity   . Sleep apnea   . Chest pain     Diagnostic cardiac cath October 2012. Normal coronary arteries, left ventricular function  . Right knee injury     Past Surgical History  Procedure Date  . Cardiac catheterization Oct 2012    nml coronary arteries, aorta, LV fcn  . Back surgery     for ruptured disc lumbar area  . Neck surgery     for ruptured disc  . Appendectomy   . Tonsillectomy     History reviewed. No pertinent family history.  History  Substance Use Topics  . Smoking status: Current Everyday Smoker  . Smokeless tobacco: Not on file  . Alcohol Use: No      Review of Systems  All other systems reviewed and are negative.  10 Systems reviewed and all are negative for acute change except as noted in the HPI.   Allergies  Lipitor; Sulfa antibiotics; and Zocor  Home Medications   Current Outpatient Rx  Name  Route Sig Dispense Refill  . HYDROCODONE-ACETAMINOPHEN 5-500 MG PO TABS Oral Take 1-2 tablets by mouth every 6 (six) hours as needed for pain. 20 tablet 0  . IBUPROFEN 600 MG PO TABS Oral Take 1 tablet (600 mg total) by mouth every 6 (six) hours as needed for pain. 30 tablet 0  . LISINOPRIL-HYDROCHLOROTHIAZIDE 10-12.5 MG PO TABS Oral Take 1 tablet by mouth daily.    . OXYCODONE-ACETAMINOPHEN 5-325 MG PO TABS Oral Take 1-2 tablets by mouth every 6 (six) hours as needed for pain. 15 tablet 0  . PREDNISONE 10 MG PO TABS Oral Take 2 tablets (20 mg total) by mouth daily. 10 tablet 0    BP 152/74  Pulse 59  Temp 97.7 F (36.5 C) (Oral)  Resp 17  SpO2 97%  Physical Exam  Nursing note and vitals reviewed. Constitutional: He is oriented to person, place, and time. He appears well-developed and well-nourished. No distress.  HENT:  Head: Normocephalic and atraumatic.  Eyes: Pupils are equal, round, and reactive to light.  Neck: Normal range of motion.  Cardiovascular: Normal rate and intact distal pulses.   Pulmonary/Chest: No respiratory distress.  Abdominal: Normal appearance. He exhibits no distension.  Musculoskeletal:       Lumbar back: He exhibits decreased range of motion, tenderness and pain.  Back:  Neurological: He is alert and oriented to person, place, and time. No cranial nerve deficit.  Skin: Skin is warm and dry. No rash noted.  Psychiatric: He has a normal mood and affect. His behavior is normal.    ED Course  Procedures (including critical care time) DIAGNOSTIC STUDIES: Oxygen Saturation is 97% on room air, normal by my interpretation.    COORDINATION OF CARE: 12:55PM EDP discusses pt ED treatment with pt.    Labs Reviewed - No data to display No results found.   1. Sciatica       MDM  I personally performed the services described in this documentation, which was scribed in my presence. The recorded information has been reviewed and considered.         Nelia Shi, MD 02/01/12 1026

## 2012-01-22 ENCOUNTER — Emergency Department (HOSPITAL_COMMUNITY)
Admission: EM | Admit: 2012-01-22 | Discharge: 2012-01-23 | Disposition: A | Discharge status: Home Health | Payer: Self-pay | Attending: Emergency Medicine | Admitting: Emergency Medicine

## 2012-01-22 ENCOUNTER — Emergency Department (HOSPITAL_COMMUNITY): Payer: Self-pay

## 2012-01-22 ENCOUNTER — Encounter (HOSPITAL_COMMUNITY): Payer: Self-pay | Admitting: *Deleted

## 2012-01-22 DIAGNOSIS — M549 Dorsalgia, unspecified: Secondary | ICD-10-CM | POA: Insufficient documentation

## 2012-01-22 DIAGNOSIS — I10 Essential (primary) hypertension: Secondary | ICD-10-CM | POA: Insufficient documentation

## 2012-01-22 DIAGNOSIS — R109 Unspecified abdominal pain: Secondary | ICD-10-CM

## 2012-01-22 DIAGNOSIS — E119 Type 2 diabetes mellitus without complications: Secondary | ICD-10-CM | POA: Insufficient documentation

## 2012-01-22 DIAGNOSIS — R1031 Right lower quadrant pain: Secondary | ICD-10-CM | POA: Insufficient documentation

## 2012-01-22 DIAGNOSIS — Z79899 Other long term (current) drug therapy: Secondary | ICD-10-CM | POA: Insufficient documentation

## 2012-01-22 DIAGNOSIS — F411 Generalized anxiety disorder: Secondary | ICD-10-CM | POA: Insufficient documentation

## 2012-01-22 LAB — URINALYSIS, ROUTINE W REFLEX MICROSCOPIC
Glucose, UA: NEGATIVE mg/dL
Hgb urine dipstick: NEGATIVE
Ketones, ur: NEGATIVE mg/dL
Protein, ur: 100 mg/dL — AB
pH: 7.5 (ref 5.0–8.0)

## 2012-01-22 LAB — COMPREHENSIVE METABOLIC PANEL
ALT: 16 U/L (ref 0–53)
Alkaline Phosphatase: 77 U/L (ref 39–117)
BUN: 14 mg/dL (ref 6–23)
CO2: 26 mEq/L (ref 19–32)
GFR calc Af Amer: >90 mL/min (ref >90)
GFR calc non Af Amer: >90 mL/min (ref >90)
Glucose, Bld: 102 mg/dL — ABNORMAL HIGH (ref 70–99)
Potassium: 3.6 mEq/L (ref 3.5–5.1)
Sodium: 141 mEq/L (ref 135–145)
Total Protein: 6.9 g/dL (ref 6.0–8.3)

## 2012-01-22 LAB — CBC
HCT: 40.4 % (ref 39.0–52.0)
Hemoglobin: 14 g/dL (ref 13.0–17.0)
MCH: 29.7 pg (ref 26.0–34.0)
MCHC: 34.7 g/dL (ref 30.0–36.0)
RBC: 4.71 MIL/uL (ref 4.22–5.81)

## 2012-01-22 LAB — URINE MICROSCOPIC-ADD ON: Urine-Other: NONE SEEN

## 2012-01-22 MED ORDER — IBUPROFEN 600 MG PO TABS: 600.0000 mg | ORAL_TABLET | Freq: Three times a day (TID) | ORAL | Status: DC | PRN

## 2012-01-22 MED ORDER — HYDROCODONE-ACETAMINOPHEN 5-500 MG PO TABS: 1.0000 | ORAL_TABLET | Freq: Four times a day (QID) | ORAL | Status: AC | PRN

## 2012-01-22 MED ORDER — SODIUM CHLORIDE 0.9 % IV SOLN
INTRAVENOUS | Status: DC
  Administered 2012-01-22: 20:00:00 via INTRAVENOUS

## 2012-01-22 MED ORDER — ONDANSETRON HCL 4 MG/2ML IJ SOLN
4.0000 mg | Freq: Once | INTRAMUSCULAR | Status: AC
  Administered 2012-01-22: 4 mg via INTRAVENOUS
  Filled 2012-01-22: qty 2

## 2012-01-22 MED ORDER — LORAZEPAM 2 MG/ML IJ SOLN
1.0000 mg | Freq: Once | INTRAMUSCULAR | Status: AC
  Administered 2012-01-22: 1 mg via INTRAVENOUS
  Filled 2012-01-22: qty 1

## 2012-01-22 MED ORDER — HYDROMORPHONE HCL PF 1 MG/ML IJ SOLN
0.5000 mg | Freq: Once | INTRAMUSCULAR | Status: AC
  Administered 2012-01-22: 0.5 mg via INTRAVENOUS
  Filled 2012-01-22: qty 1

## 2012-01-22 NOTE — ED Provider Notes (Signed)
History     CSN: [id number]  Arrival date & time 01/22/12  1756   First MD Initiated Contact with Patient 01/22/12 1901      Chief Complaint  Patient presents with  . Abdominal Pain    RLQ    (Consider location/radiation/quality/duration/timing/severity/associated sxs/prior treatment) Patient is a 61 y.o. male presenting with abdominal pain. The history is provided by the patient.  Abdominal Pain The primary symptoms of the illness include abdominal pain. The primary symptoms of the illness do not include fever, shortness of breath or dysuria.  Additional symptoms associated with the illness include back pain. Symptoms associated with the illness do not include chills or hematuria.  pt c/o right flank pain posteriorly radiating to right groin. Onset last pm. Constant, dull, waxes and wanes in intensity. States hx back pain/ddd in past but states this feels different. No scrotal or testicular pain. No hematuria or dysuria. No hx kidney stones. No fever or chills. No leg pain. Denies any recent trauma or fall. No leg numbness/weakness. No problems urinating. Having regular bms. States nausea due to pain, no vomiting.      Past Medical History  Diagnosis Date  . Diabetes mellitus   . Hypertension   . Obesity   . Sleep apnea   . Chest pain     Diagnostic cardiac cath October 2012. Normal coronary arteries, left ventricular function    Past Surgical History  Procedure Date  . Cardiac catheterization Oct 2012    nml coronary arteries, aorta, LV fcn    No family history on file.  History  Substance Use Topics  . Smoking status: Current Everyday Smoker  . Smokeless tobacco: Not on file  . Alcohol Use: No      Review of Systems  Constitutional: Negative for fever and chills.  HENT: Negative for neck pain.   Eyes: Negative for redness.  Respiratory: Negative for cough and shortness of breath.   Cardiovascular: Negative for chest pain and leg swelling.  Gastrointestinal:  Positive for abdominal pain.  Genitourinary: Positive for flank pain. Negative for dysuria and hematuria.  Musculoskeletal: Positive for back pain.  Skin: Negative for rash.  Neurological: Negative for weakness, numbness and headaches.  Hematological: Does not bruise/bleed easily.  Psychiatric/Behavioral: Negative for confusion.    Allergies  Lipitor; Sulfa antibiotics; and Zocor  Home Medications   Current Outpatient Rx  Name Route Sig Dispense Refill  . IBUPROFEN 600 MG PO TABS Oral Take 1 tablet (600 mg total) by mouth every 6 (six) hours as needed for pain. 30 tablet 0  . LISINOPRIL-HYDROCHLOROTHIAZIDE 10-12.5 MG PO TABS Oral Take 1 tablet by mouth daily.    . OXYCODONE-ACETAMINOPHEN 5-325 MG PO TABS Oral Take 2 tablets by mouth every 4 (four) hours as needed for pain. 15 tablet 0  . PREDNISONE 10 MG PO TABS Oral Take 2 tablets (20 mg total) by mouth daily. 15 tablet 0    BP 157/74  Pulse 52  Temp 98.7 F (37.1 C) (Oral)  Resp 13  SpO2 100%  Physical Exam  Nursing note and vitals reviewed. Constitutional: He is oriented to person, place, and time. He appears well-developed and well-nourished. No distress.  HENT:  Head: Atraumatic.  Nose: Nose normal.  Mouth/Throat: Oropharynx is clear and moist.  Eyes: Conjunctivae are normal. No scleral icterus.  Neck: Neck supple. No tracheal deviation present.  Cardiovascular: Normal rate, regular rhythm, normal heart sounds and intact distal pulses.   Pulmonary/Chest: Effort normal and breath sounds normal.  No accessory muscle usage. No respiratory distress.  Abdominal: Soft. Bowel sounds are normal. He exhibits no distension and no mass. There is no tenderness. There is no rebound and no guarding.       No hernia.   Genitourinary:       Testes desc bil, non tender. No edema. No cva tenderness  Musculoskeletal: Normal range of motion. He exhibits no edema and no tenderness.       CTLS spine, non tender, aligned, no step off. Good  rom at right hip without pain.   Neurological: He is alert and oriented to person, place, and time.       Motor intact bil legs. Steady gait.   Skin: Skin is warm and dry.  Psychiatric:       anxious    ED Course  Procedures (including critical care time)  Labs Reviewed  URINALYSIS, ROUTINE W REFLEX MICROSCOPIC - Abnormal; Notable for the following:    Protein, ur 100 (*)     All other components within normal limits  URINE MICROSCOPIC-ADD ON    Results for orders placed during the hospital encounter of 01/22/12  URINALYSIS, ROUTINE W REFLEX MICROSCOPIC      Component Value Range   Color, Urine YELLOW  YELLOW   APPearance CLEAR  CLEAR   Specific Gravity, Urine 1.013  1.005 - 1.030   pH 7.5  5.0 - 8.0   Glucose, UA NEGATIVE  NEGATIVE mg/dL   Hgb urine dipstick NEGATIVE  NEGATIVE   Bilirubin Urine NEGATIVE  NEGATIVE   Ketones, ur NEGATIVE  NEGATIVE mg/dL   Protein, ur 161 (*) NEGATIVE mg/dL   Urobilinogen, UA 0.2  0.0 - 1.0 mg/dL   Nitrite NEGATIVE  NEGATIVE   Leukocytes, UA NEGATIVE  NEGATIVE  URINE MICROSCOPIC-ADD ON      Component Value Range   Urine-Other       Value: NO FORMED ELEMENTS SEEN ON URINE MICROSCOPIC EXAMINATION  CBC      Component Value Range   WBC 9.8  4.0 - 10.5 K/uL   RBC 4.71  4.22 - 5.81 MIL/uL   Hemoglobin 14.0  13.0 - 17.0 g/dL   HCT 09.6  04.5 - 40.9 %   MCV 85.8  78.0 - 100.0 fL   MCH 29.7  26.0 - 34.0 pg   MCHC 34.7  30.0 - 36.0 g/dL   RDW 81.1  91.4 - 78.2 %   Platelets 106 (*) 150 - 400 K/uL  COMPREHENSIVE METABOLIC PANEL      Component Value Range   Sodium 141  135 - 145 mEq/L   Potassium 3.6  3.5 - 5.1 mEq/L   Chloride 105  96 - 112 mEq/L   CO2 26  19 - 32 mEq/L   Glucose, Bld 102 (*) 70 - 99 mg/dL   BUN 14  6 - 23 mg/dL   Creatinine, Ser 9.56  0.50 - 1.35 mg/dL   Calcium 9.5  8.4 - 21.3 mg/dL   Total Protein 6.9  6.0 - 8.3 g/dL   Albumin 3.5  3.5 - 5.2 g/dL   AST 17  0 - 37 U/L   ALT 16  0 - 53 U/L   Alkaline Phosphatase 77   39 - 117 U/L   Total Bilirubin 0.3  0.3 - 1.2 mg/dL   GFR calc non Af Amer >90  >90 mL/min   GFR calc Af Amer >90  >90 mL/min   Ct Abdomen Pelvis Wo Contrast  01/22/2012  *RADIOLOGY  REPORT*  Clinical Data: Right flank pain.  Evaluate for renal calculus.  CT ABDOMEN AND PELVIS WITHOUT CONTRAST  Technique:  Multidetector CT imaging of the abdomen and pelvis was performed following the standard protocol without intravenous contrast.  Comparison: None  Findings: Lung bases are clear.  No pericardial or pleural effusion.  There is no focal liver abnormality.  The liver has somewhat of a nodular contour and and there appears to be hypertrophy of the left hepatic lobe.  No discrete liver abnormality noted.  The spleen is enlarged measuring 16 cm in craniocaudal dimension.  The gallbladder appears normal.  There is no biliary dilatation.  Normal appearance of the pancreas.  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  The left adrenal gland is normal.  The kidneys both appear normal.  No nephrolithiasis or hydronephrosis.  The ureters have a normal caliber.  Normal appearance of the urinary bladder.  Prostate gland and seminal vesicles appear normal.  There is no free fluid or fluid collections within the abdomen or the pelvis. The stomach and the small bowel loops appear normal. No evidence for obstruction.  The colon is unremarkable.  Review of the visualized osseous structures is significant for mild degenerative disc disease.  IMPRESSION:  1.  No acute findings within the abdomen or pelvis. 2.  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 3.  Splenomegaly.  Original Report Authenticated By: Rosealee Albee, M.D.         MDM  Iv ns. Dilaudid iv. Pt states hx anxiety, requests med for same. Ativan 1 mg iv.  Ct. Labs.   Reviewed nursing notes and prior charts for additional history.   Recheck pt comfortable. abd soft nt. Hr 72 rr 16. No distress.   Pt states out of pain  med.     Suzi Roots, MD 01/22/12 2155

## 2012-01-22 NOTE — ED Notes (Signed)
Per EMS, pt from home, pt reports RLQ,denies n/v.  Pt was seen at [facility] ED Friday for same, was given rx oxycodone and prednisone.  Pt reports that his wife gave him what he thought was pain med but now he states that it might be prednisone.

## 2012-01-22 NOTE — Discharge Instructions (Signed)
Take motrin as need for pain. You may take vicodin as need for pain. No driving for the next 6 hours or when taking vicodin. Also, do not take tylenol or acetaminophen containing medication when taking vicodin. Avoid bending at waist or heavy lifting more than 20 lbs for the next week. Try heat/heating pad to sore area. Follow up with primary care doctor in coming week for recheck - discuss referral to back specialist and/or further workup if symptoms fail  to improve/resolve. Return to ER if worse, leg numbness/weakness, intractable pain, fevers, other concern.  Your ct scan was read as showing no acute process. Incidental note was made of an enlarged spleen - follow up with primary care doctor.      Flank Pain Flank pain refers to pain that is located on the side of the body between the upper abdomen and the back. It can be caused by many things. CAUSES  Some of the more common causes of flank pain include:  Muscle strain.   Muscle spasms.   A disease of your spine (vertebral disk disease).   A lung infection (pneumonia).   Fluid around your lungs (pulmonary edema).   A kidney infection.   Kidney stones.   A very painful skin rash on only one side of your body (shingles).   Gallbladder disease.  DIAGNOSIS  Blood tests, urine tests, and X-rays may help your caregiver determine what is wrong. TREATMENT  The treatment of pain depends on the cause. Your caregiver will determine what treatment will work best for you. HOME CARE INSTRUCTIONS   Home care will depend on the cause of your pain.   Some medications may help relieve the pain. Take medication for relief of pain as directed by your caregiver.   Tell your caregiver about any changes in your pain.   Follow up with your caregiver.  SEEK IMMEDIATE MEDICAL CARE IF:   Your pain is not controlled with medication.   The pain increases.   You have abdominal pain.   You have shortness of breath.   You have persistent  nausea or vomiting.   You have swelling in your abdomen.   You feel faint or pass out.   You have a temperature by mouth above 102 F (38.9 C), not controlled by medicine.  MAKE SURE YOU:   Understand these instructions.   Will watch your condition.   Will get help right away if you are not doing well or get worse.  Document Released: 08/31/2005 Document Revised: 06/29/2011 Document Reviewed: 12/25/2009 [facility] Patient Information 2012 [place], [place].      Back Pain, Adult Low back pain is very common. About 1 in 5 people have back pain.The cause of low back pain is rarely dangerous. The pain often gets better over time.About half of people with a sudden onset of back pain feel better in just 2 weeks. About 8 in 10 people feel better by 6 weeks.  CAUSES Some common causes of back pain include:  Strain of the muscles or ligaments supporting the spine.   Wear and tear (degeneration) of the spinal discs.   Arthritis.   Direct injury to the back.  DIAGNOSIS Most of the time, the direct cause of low back pain is not known.However, back pain can be treated effectively even when the exact cause of the pain is unknown.Answering your caregiver's questions about your overall health and symptoms is one of the most accurate ways to make sure the cause of your pain is  not dangerous. If your caregiver needs more information, he or she may order lab work or imaging tests (X-rays or MRIs).However, even if imaging tests show changes in your back, this usually does not require surgery. HOME CARE INSTRUCTIONS For many people, back pain returns.Since low back pain is rarely dangerous, it is often a condition that people can learn to [facility] their own.   Remain active. It is stressful on the back to sit or stand in one place. Do not sit, drive, or stand in one place for more than 30 minutes at a time. Take short walks on level surfaces as soon as pain allows.Try to increase the length of  time you walk each day.   Do not stay in bed.Resting more than 1 or 2 days can delay your recovery.   Do not avoid exercise or work.Your body is made to move.It is not dangerous to be active, even though your back may hurt.Your back will likely heal faster if you return to being active before your pain is gone.   Pay attention to your body when you bend and lift. Many people have less discomfortwhen lifting if they bend their knees, keep the load close to their bodies,and avoid twisting. Often, the most comfortable positions are those that put less stress on your recovering back.   Find a comfortable position to sleep. Use a firm mattress and lie on your side with your knees slightly bent. If you lie on your back, put a pillow under your knees.   Only take over-the-counter or prescription medicines as directed by your caregiver. Over-the-counter medicines to reduce pain and inflammation are often the most helpful.Your caregiver may prescribe muscle relaxant drugs.These medicines help dull your pain so you can more quickly return to your normal activities and healthy exercise.   Put ice on the injured area.   Put ice in a plastic bag.   Place a towel between your skin and the bag.   Leave the ice on for 15 to 20 minutes, 3 to 4 times a day for the first 2 to 3 days. After that, ice and heat may be alternated to reduce pain and spasms.   Ask your caregiver about trying back exercises and gentle massage. This may be of some benefit.   Avoid feeling anxious or stressed.Stress increases muscle tension and can worsen back pain.It is important to recognize when you are anxious or stressed and learn ways to manage it.Exercise is a great option.  SEEK MEDICAL CARE IF:  You have pain that is not relieved with rest or medicine.   You have pain that does not improve in 1 week.   You have new symptoms.   You are generally not feeling well.  SEEK IMMEDIATE MEDICAL CARE IF:   You have  pain that radiates from your back into your legs.   You develop new bowel or bladder control problems.   You have unusual weakness or numbness in your arms or legs.   You develop nausea or vomiting.   You develop abdominal pain.   You feel faint.  Document Released: 07/10/2005 Document Revised: 06/29/2011 Document Reviewed: 11/28/2010 [facility] Patient Information 2012 [place], [place].    RESOURCE GUIDE  Chronic Pain Problems: Contact [facility]  [phone] Patients need to be referred by their primary care doctor.  Insufficient Money for Medicine: Contact United Way:  call "211" or [facility] [phone].  No Primary Care Doctor: - Call [facility]  [phone] -  can help you locate a primary care doctor that  accepts your insurance, provides certain services, etc. - Physician Referral Service- [phone]  Agencies that provide inexpensive medical care: - [facility]  [phone] - [facility]  [phone] - [facility]  [phone] - Women's Clinic  [phone] - Planned Parenthood  [phone] [facility]  [phone]  Medicaid-accepting [facility] Providers: - [facility]- [address]  [phone], Mon-Fri 9am-7pm, Sat 9am-1pm - [facility]- [address] [place], [address] [place]  [phone] - [facility]- [address] [place]  [phone] [facility]- [address]  [phone] - Renaye Rakers- [address] [place], [address], [phone]  Only accepts [place] patients after they have their name  applied to their card  Self Pay (no insurance) in [place]: - Sickle Cell Patients: Dr Willey Blade, [facility]  [address] [place], [phone] - [facility]- [address] [place]  [phone]       [facility] [place]- [address],  [address]       -     [facility]- see information above (Speak to [employer] if you do not have insurance)       -  [facility]- [address] [place], [phone]       -  [facility]- [address],  [phone]       -  [facility]- [address] [phone]       -  Dr Julio Sicks-  [address], [place], [phone]       -  [facility]- [address] [phone]       -  [facility]- [address], [phone], also [address] [phone]       -    [facility]- [address] [place], [phone], 1st & 3rd Saturday   every month, 10am-1pm  1) Find a Doctor and Pay Out of Pocket Although you won't have to find out who is covered by your insurance plan, it is a good idea to ask around and get recommendations. You will then need to call the office and see if the doctor you have chosen will accept you as a new patient and what types of options they offer for patients who are self-pay. Some doctors offer discounts or will set up payment plans for their patients who do not have insurance, but you will need to ask so you aren't surprised when you get to your appointment.  2) Contact Your Local Health Department Not all health departments have doctors that can see patients for sick visits, but many do, so it is worth a call to see if yours does. If you don't know where your local health department is, you can check in your phone book. The CDC also has a tool to help you locate your state's health department, and many state websites also have listings of all of their local health departments.  3) Find a Walk-in Clinic If your illness is not likely to be very severe or complicated, you may want to try a walk in clinic. These are popping up all over the country in pharmacies, drugstores, and shopping centers. They're usually staffed  by nurse practitioners or physician assistants that have been trained to treat common  illnesses and complaints. They're usually fairly quick and inexpensive. However, if you have serious medical issues or chronic medical problems, these are probably not your best option  STD Testing - [facility] Department of [facility], STD Clinic, [address] [place], phone [phone] or [phone].  Monday - Friday, call for an appointment. [facility] Department of [employer], STD Clinic, [address], [place], phone [phone] or [phone].  Monday - Friday, call for an appointment.  Abuse/Neglect: [facility] Child Abuse Hotline 636-709-2712 [facility] Child Abuse Hotline 979-648-4379 (After Hours)  Emergency Shelter:  [facility] [phone]  Maternity Homes: - [facility] [phone] - [facility] [phone]  MRSA Hotline #:   216-277-9859  [facility] Resources  [facility] [place]  United [employer] [facility] Dept. [address]                 [address]         [address] [place] [address]  Blondell Reveal Phone:  [phone]                                  Phone:  [phone]                   Phone:  [phone]  [facility] Suite Mental Health, [phone] - Plastic And Reconstructive Surgeons - [phone]       -     [facility] in [place], [address]                                  [phone], [facility] Child Abuse Hotline 9197820741 or 704-226-9091 (After Hours)   Behavioral Health Services  Substance Abuse Resources: - Alcohol and Drug Services  [phone] - [facility] [phone] - [facility] [place] [phone] Floydene Flock [phone] - Residential & Outpatient Substance Abuse Program  7052748692  Psychological  Services: [facility]  ([phone] Services  [phone] - [facility], [phone] [place]. [address], [place], ACCESS LINE: (607)362-4955 or (249) [phone], [employer]za  Dental Assistance  If unable to pay or uninsured, contact:  [facility] or [facility]. to become qualified for the adult dental clinic.  Patients with Medicaid: [facility] [phone] [address], [phone] [address] [phone]  If unable to pay, or uninsured, contact [facility] [phone]) or [facility] Department [phone] in [place], [phone] in [facility]) to become qualified for the adult dental clinic  Other Low-Cost Community Dental Services: - [facility]- [address] [place], [place], [place], [address], [phone], Ext. [phone], 2nd and 4th Thursday of  the month at 6:30am.  10 clients each day by appointment, can sometimes see walk-in patients if someone does not show for an appointment. [facility]- [address] [place], [place], [address], [phone] - [facility]- [address], [place], [place], [address], [phone] - [place] [facility]- [phone] [facility] Health Department- [phone] [facility] Department- [phone]

## 2012-01-25 ENCOUNTER — Emergency Department (HOSPITAL_COMMUNITY)
Admission: EM | Admit: 2012-01-25 | Discharge: 2012-01-25 | Disposition: A | Discharge status: Home / Self Care | Payer: Self-pay | Attending: Emergency Medicine | Admitting: Emergency Medicine

## 2012-01-25 ENCOUNTER — Encounter (HOSPITAL_COMMUNITY): Payer: Self-pay | Admitting: *Deleted

## 2012-01-25 ENCOUNTER — Telehealth (HOSPITAL_COMMUNITY): Payer: Self-pay | Admitting: *Deleted

## 2012-01-25 ENCOUNTER — Emergency Department (HOSPITAL_COMMUNITY)
Admission: EM | Admit: 2012-01-25 | Discharge: 2012-01-25 | Disposition: A | Discharge status: Nursing Home (Includes ICF) | Payer: Self-pay | Source: Home / Self Care | Attending: Emergency Medicine | Admitting: Emergency Medicine

## 2012-01-25 DIAGNOSIS — G4730 Sleep apnea, unspecified: Secondary | ICD-10-CM | POA: Insufficient documentation

## 2012-01-25 DIAGNOSIS — I10 Essential (primary) hypertension: Secondary | ICD-10-CM | POA: Insufficient documentation

## 2012-01-25 DIAGNOSIS — M25559 Pain in unspecified hip: Secondary | ICD-10-CM

## 2012-01-25 DIAGNOSIS — M25551 Pain in right hip: Secondary | ICD-10-CM

## 2012-01-25 DIAGNOSIS — F17200 Nicotine dependence, unspecified, uncomplicated: Secondary | ICD-10-CM | POA: Insufficient documentation

## 2012-01-25 DIAGNOSIS — E119 Type 2 diabetes mellitus without complications: Secondary | ICD-10-CM | POA: Insufficient documentation

## 2012-01-25 DIAGNOSIS — R7989 Other specified abnormal findings of blood chemistry: Secondary | ICD-10-CM

## 2012-01-25 DIAGNOSIS — R791 Abnormal coagulation profile: Secondary | ICD-10-CM

## 2012-01-25 HISTORY — DX: Unspecified injury of right lower leg, initial encounter: S89.91XA

## 2012-01-25 LAB — D-DIMER, QUANTITATIVE: D-Dimer, Quant: 0.96 ug/mL-FEU — ABNORMAL HIGH (ref 0.00–0.48)

## 2012-01-25 MED ORDER — IBUPROFEN 600 MG PO TABS: 600.0000 mg | ORAL_TABLET | Freq: Four times a day (QID) | ORAL | Status: AC | PRN

## 2012-01-25 MED ORDER — HYDROCODONE-ACETAMINOPHEN 5-325 MG PO TABS
ORAL_TABLET | ORAL | Status: AC
  Filled 2012-01-25: qty 2

## 2012-01-25 MED ORDER — HYDROCODONE-ACETAMINOPHEN 5-325 MG PO TABS
2.0000 | ORAL_TABLET | Freq: Once | ORAL | Status: AC
Start: 2012-01-25 — End: 2012-01-25
  Administered 2012-01-25: 2 via ORAL

## 2012-01-25 MED ORDER — OXYCODONE-ACETAMINOPHEN 5-325 MG PO TABS: 1.0000 | ORAL_TABLET | Freq: Four times a day (QID) | ORAL | Status: AC | PRN

## 2012-01-25 MED ORDER — PREDNISONE 10 MG PO TABS: 20.0000 mg | ORAL_TABLET | Freq: Every day | ORAL | Status: DC

## 2012-01-25 NOTE — ED Notes (Signed)
Pt presented to ED with  Chest pain,joint pain and a positive d dimer in the urgent care.

## 2012-01-25 NOTE — ED Provider Notes (Signed)
History     CSN: [id number]  Arrival date & time 01/25/12  1654   First MD Initiated Contact with Patient 01/25/12 1710      Chief Complaint  Patient presents with  . Leg Pain    (Consider location/radiation/quality/duration/timing/severity/associated sxs/prior treatment) HPI Comments: Patient reports achy, constant right hip pain that radiates down into his groin/inner thigh for 2 weeks. Symptoms started after beginning a new exercise routine- states that he was using a treadmill and bicycle. Denies falling off either one. Reports numbness along his inner thigh, which is new for him.  It is worse when he stands, walks, and with internal/external rotation of the hip. No nausea, vomiting, fevers. No testicular swelling, pain. No rash. No urinary complaints. Reports some constipation, but he has been taking narcotics. He has a history of lower back surgery for a disc, but denies any recent history of trauma to his back. No history of cancer, prolonged steroid use, IVDU. No history of PE or DVT.  Was seen in the ER on 6/26 with right hip pain.  Hip x-ray showed some spurring at L3-4, otherwise, no acute changes. He was sent home with Percocet, ibuprofen, and steroids, which he says helped. Sx got worse when he ran out of prednisone.  He subsequently went to the ED on 7/1 with right flank and right groin pain. Had proteinuria, but no hematuria. No white count. noncontrast CT showed mild splenomegaly and changes suggestive of cirrhosis. CT was otherwise unremarkable- No nephrolithiasis, hydronephrosis, prostate enlargement.   Patient is a 61 y.o. male presenting with leg pain. The history is provided by the patient. No language interpreter was used.  Leg Pain  The incident occurred more than 1 week ago. The incident occurred at home. The injury mechanism is unknown. The pain is present in the right hip. The quality of the pain is described as aching, burning and throbbing. The pain has been constant  since onset. Associated symptoms include numbness, inability to bear weight, muscle weakness and loss of sensation. The symptoms are aggravated by activity and bearing weight. He has tried NSAIDs, acetaminophen and rest for the symptoms. The treatment provided mild relief.    Past Medical History  Diagnosis Date  . Diabetes mellitus   . Hypertension   . Obesity   . Sleep apnea   . Chest pain     Diagnostic cardiac cath October 2012. Normal coronary arteries, left ventricular function  . Right knee injury     Past Surgical History  Procedure Date  . Cardiac catheterization Oct 2012    nml coronary arteries, aorta, LV fcn  . Back surgery     for ruptured disc lumbar area  . Neck surgery     for ruptured disc  . Appendectomy   . Tonsillectomy     History reviewed. No pertinent family history.  History  Substance Use Topics  . Smoking status: Current Everyday Smoker  . Smokeless tobacco: Not on file  . Alcohol Use: No      Review of Systems  Neurological: Positive for numbness.    Allergies  Lipitor; Sulfa antibiotics; and Zocor  Home Medications   Current Outpatient Rx  Name Route Sig Dispense Refill  . HYDROCODONE-ACETAMINOPHEN 5-500 MG PO TABS Oral Take 1-2 tablets by mouth every 6 (six) hours as needed for pain. 20 tablet 0  . IBUPROFEN 600 MG PO TABS Oral Take 1 tablet (600 mg total) by mouth every 6 (six) hours as needed for pain. 30  tablet 0  . IBUPROFEN 600 MG PO TABS Oral Take 1 tablet (600 mg total) by mouth every 8 (eight) hours as needed for pain. Take with food. 20 tablet 0  . LISINOPRIL-HYDROCHLOROTHIAZIDE 10-12.5 MG PO TABS Oral Take 1 tablet by mouth daily.    . OXYCODONE-ACETAMINOPHEN 5-325 MG PO TABS Oral Take 2 tablets by mouth every 4 (four) hours as needed for pain. 15 tablet 0  . PREDNISONE 10 MG PO TABS Oral Take 2 tablets (20 mg total) by mouth daily. 15 tablet 0    BP 102/72  Pulse 74  Temp 97.6 F (36.4 C) (Oral)  Resp 20  SpO2  98% Filed Vitals:   01/25/12 1717 01/25/12 1719 01/25/12 1746  BP: 94/65 96/70 102/72  Pulse: 74    Temp: 97.6 F (36.4 C)    TempSrc: Oral    Resp: 20    SpO2: 98%     Filed Vitals:   01/25/12 1717 01/25/12 1719 01/25/12 1746  BP: 94/65 96/70 102/72  Pulse: 74    Temp: 97.6 F (36.4 C)    TempSrc: Oral    Resp: 20    SpO2: 98%      Physical Exam  Nursing note and vitals reviewed. Constitutional: He is oriented to person, place, and time. He appears well-developed and well-nourished.  HENT:  Head: Normocephalic and atraumatic.  Eyes: Conjunctivae and EOM are normal.  Neck: Normal range of motion.  Cardiovascular: Normal rate, regular rhythm and normal heart sounds.   Pulmonary/Chest: Effort normal and breath sounds normal. No respiratory distress.  Abdominal: Soft. Bowel sounds are normal. He exhibits no distension and no mass. There is no hepatomegaly. There is no tenderness. There is no rebound, no guarding and no CVA tenderness. No hernia. Hernia confirmed negative in the right inguinal area and confirmed negative in the left inguinal area.       obese  Genitourinary: Testes normal. Cremasteric reflex is present. Uncircumcised.  Musculoskeletal: Normal range of motion.       Legs:      Tenderness along the SI joint, right lateral hip- see drawing. Absent sensation to light touch and temperature along inner thigh.   No signs of trauma R hip. No bruising, erythema, rash.  No tenderness down IT band, quadriceps. No pain with passive abduction/adduction of leg. Pain with int/ext rotation hip. No tenderness at sciatic notch. Pt able to f/ex/extend knee. Knee joint NT. Motor strenght flexion/ext hip 4/5. Marland Kitchen DP 2+  Neurological: He is alert and oriented to person, place, and time. Coordination normal.  Reflex Scores:      Patellar reflexes are 2+ on the right side and 2+ on the left side.      Antalgic gait   Skin: Skin is warm and dry.  Psychiatric: He has a normal mood and  affect. His behavior is normal. Judgment and thought content normal.    ED Course  Procedures (including critical care time)  Labs Reviewed  D-DIMER, QUANTITATIVE - Abnormal; Notable for the following:    D-Dimer, Quant 0.96 (*)     All other components within normal limits   No results found.   1. Hip pain   2. Positive D dimer       MDM  Previous records reviewed. As noted in history of present illness..  [place] narcotic database reviewed. Percocet 5/325 #15 prescription on 6/25. no other narcotic prescriptions  BP on prev 2 visits 150's/70's.   Reviewed noncontrast abd CT by myself. Discussed  it with radiology. No evidence of pelvic or hip fractures, hernia. Patient has no history of DVT or PE. However, has been bedridden for almost 2 weeks. Will check d-dimer to rule out DVT. If positive will send him to the ED for further evaluation  Dimer Elevated at 1.42 in October 2012. Chest CT was negative for PE or thoracic aortic aneurysm.  Given patient's change in blood pressure,  positive dimer, and immobilization, cannot rule out DVT. Vitals otherwise acceptable. Transferring to the ED for imaging.   Luiz Blare, MD 01/25/12 [phone]

## 2012-01-25 NOTE — ED Provider Notes (Signed)
History     CSN: [id number]  Arrival date & time 01/25/12  2014   First MD Initiated Contact with Patient 01/25/12 2114      Chief Complaint  Patient presents with  . joint pain     (Consider location/radiation/quality/duration/timing/severity/associated sxs/prior treatment) HPI Comments: Patient was sent here from Urgent Care because he was found to have an elevated d-dimer.  Patient has had right hip pain for the past 2 weeks.  Pain located over the right inguinal ligament.  Symptoms began after he started a new work out that involved riding a bicycle and walking on the treadmill.  He has been evaluated for this in the ED previously on 01/17/12 and had a hip xray, which showed some spurring at L3-L4, but was otherwise negative.  He was then evaluated in the ED again on 7/1 and had a CT ab/pelvis, which was negative for kidney stones.  He reports that the pain is worse with movement and describes the pain as a "pulling" pain.  No prior history of DVT or PE.  He denies any swelling or erythema.  No pain of his right or left calf.  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  At his previous visit in the ED he was given prescription for Percocet, Ibuprofen, and Prednisone.  He states that these medications helped his pain.  The history is provided by the patient.    Past Medical History  Diagnosis Date  . Diabetes mellitus   . Hypertension   . Obesity   . Sleep apnea   . Chest pain     Diagnostic cardiac cath October 2012. Normal coronary arteries, left ventricular function  . Right knee injury     Past Surgical History  Procedure Date  . Cardiac catheterization Oct 2012    nml coronary arteries, aorta, LV fcn  . Back surgery     for ruptured disc lumbar area  . Neck surgery     for ruptured disc  . Appendectomy   . Tonsillectomy     No family history on file.  History  Substance Use Topics  . Smoking status: Current  Everyday Smoker  . Smokeless tobacco: Not on file  . Alcohol Use: No      Review of Systems  Constitutional: Negative for fever and chills.  Respiratory: Negative for shortness of breath.   Cardiovascular: Negative for chest pain.  Gastrointestinal: Negative for nausea and vomiting.  Musculoskeletal: Negative for back pain and joint swelling.  Skin: Negative for color change and wound.  All other systems reviewed and are negative.    Allergies  Lipitor; Sulfa antibiotics; and Zocor  Home Medications   Current Outpatient Rx  Name Route Sig Dispense Refill  . HYDROCODONE-ACETAMINOPHEN 5-500 MG PO TABS Oral Take 1-2 tablets by mouth every 6 (six) hours as needed for pain. 20 tablet 0  . IBUPROFEN 600 MG PO TABS Oral Take 1 tablet (600 mg total) by mouth every 6 (six) hours as needed for pain. 30 tablet 0  . LISINOPRIL-HYDROCHLOROTHIAZIDE 10-12.5 MG PO TABS Oral Take 1 tablet by mouth daily.    . OXYCODONE-ACETAMINOPHEN 5-325 MG PO TABS Oral Take 2 tablets by mouth every 4 (four) hours as needed for pain. 15 tablet 0    BP 97/78  Pulse 62  Temp 97.8 F (36.6 C) (Oral)  Resp 24  SpO2 100%  Physical Exam  Nursing note and  vitals reviewed. Constitutional: He appears well-developed and well-nourished. No distress.  HENT:  Head: Normocephalic and atraumatic.  Neck: Normal range of motion. Neck supple.  Cardiovascular: Normal rate, regular rhythm, normal heart sounds and intact distal pulses.   Pulses:      Dorsalis pedis pulses are 2+ on the right side, and 2+ on the left side.  Pulmonary/Chest: Effort normal and breath sounds normal.  Musculoskeletal:       Right hip: He exhibits normal range of motion, normal strength, no tenderness, no bony tenderness, no swelling and no deformity.       Negative Homan's sign bilaterally.  Neurological: He is alert.  Skin: Skin is warm and dry. He is not diaphoretic. No erythema.       No erythema, edema, or palpable cords.    Psychiatric: He has a normal mood and affect.    ED Course  Procedures (including critical care time)  Labs Reviewed - No data to display No results found.   1. Right hip pain       MDM  Patient presented from Urgent Care due to an elevated D-dimer.  Patient scheduled to have lower extremity ultrasound to rule out a DVT tomorrow.  Suspicion for DVT is very low.  Patient does not have any erythema or edema.  Pain has been present for the past 2 weeks.  No pain or tenderness of the calf bilaterally.  Therefore, patient was not given Lovenox injection while in the ED.  Suspect pain is most likely muscle strain.  Pain is located over the inguinal ligament and began after starting a new work out.  Pain worse with movement.       Pascal Lux [place], PA-C 01/26/12 [phone]

## 2012-01-25 NOTE — ED Notes (Signed)
Pt  Reports       r  Upper  Leg  Pain       X  2  Weeks  Ago -  Pt  Seen  3  Days  Ago  In  Er        For  Same  And  Was  Told  To  followup  With a  pcp        He  Has  None  He  States   -  Pt  Also  Reports  He  Is  Out of  His  Pain meds          He  Is  Awake  And  Alert      Significant other at  Bedside  He

## 2012-01-25 NOTE — ED Notes (Signed)
Pt c/o joint pain for 2 weeks.  Sent here from [facility]

## 2012-01-25 NOTE — ED Notes (Signed)
Pt for discharge.Vital signs stable,GCS 15.Pt still in pain .Discharge instruction given to pt and his wife.

## 2012-01-25 NOTE — ED Notes (Signed)
Pt.'s wife called on VM and said he has been to the ED twice, is bedridden and does not have a PCP. She wanted to know if she could bring him here. I called back @ 1653 and the person that answered said she already left with him, to come here.  No cell phone to redirect pt. to ED. I met wife on arrival. I told her I had tried to call her back. I told her we could see her husband but we may have to send him to the ED. Vassie Moselle 01/25/2012

## 2012-01-26 ENCOUNTER — Ambulatory Visit (HOSPITAL_COMMUNITY)
Admission: RE | Admit: 2012-01-26 | Discharge: 2012-01-26 | Disposition: A | Discharge status: Home / Self Care | Payer: Self-pay | Source: Ambulatory Visit | Attending: Emergency Medicine | Admitting: Emergency Medicine

## 2012-01-26 DIAGNOSIS — M79609 Pain in unspecified limb: Secondary | ICD-10-CM | POA: Insufficient documentation

## 2012-01-26 NOTE — ED Provider Notes (Signed)
Medical screening examination/treatment/procedure(s) were performed by non-physician practitioner and as supervising physician I was immediately available for consultation/collaboration.   Benny Lennert, MD 01/26/12 1524

## 2012-01-26 NOTE — Progress Notes (Addendum)
VASCULAR LAB PRELIMINARY  PRELIMINARY  PRELIMINARY  PRELIMINARY  Bilateral lower extremity venous duplex completed.    Preliminary report:  Bilateral:  No evidence of DVT, superficial thrombosis, or Baker's Cyst.   Aldyn Toon, RVS 01/26/2012, 10:03 AM

## 2012-12-09 ENCOUNTER — Ambulatory Visit (INDEPENDENT_AMBULATORY_CARE_PROVIDER_SITE_OTHER): Payer: No Typology Code available for payment source | Admitting: Family Medicine

## 2012-12-09 ENCOUNTER — Encounter: Payer: Self-pay | Admitting: Family Medicine

## 2012-12-09 VITALS — BP 120/80 | HR 72 | Temp 97.5°F | Resp 18 | Ht 71.0 in | Wt 261.0 lb

## 2012-12-09 DIAGNOSIS — IMO0001 Reserved for inherently not codable concepts without codable children: Secondary | ICD-10-CM

## 2012-12-09 DIAGNOSIS — F17200 Nicotine dependence, unspecified, uncomplicated: Secondary | ICD-10-CM | POA: Insufficient documentation

## 2012-12-09 DIAGNOSIS — E785 Hyperlipidemia, unspecified: Secondary | ICD-10-CM | POA: Insufficient documentation

## 2012-12-09 DIAGNOSIS — I10 Essential (primary) hypertension: Secondary | ICD-10-CM

## 2012-12-09 DIAGNOSIS — E119 Type 2 diabetes mellitus without complications: Secondary | ICD-10-CM | POA: Insufficient documentation

## 2012-12-09 DIAGNOSIS — R300 Dysuria: Secondary | ICD-10-CM

## 2012-12-09 HISTORY — DX: Hyperlipidemia, unspecified: E78.5

## 2012-12-09 LAB — URINALYSIS, ROUTINE W REFLEX MICROSCOPIC
Bilirubin Urine: NEGATIVE
Glucose, UA: NEGATIVE mg/dL
Hgb urine dipstick: NEGATIVE
Ketones, ur: NEGATIVE mg/dL
Leukocytes, UA: NEGATIVE
Nitrite: NEGATIVE
Protein, ur: >300 mg/dL — AB
Specific Gravity, Urine: >1.03 — ABNORMAL HIGH (ref 1.005–1.030)
Urobilinogen, UA: 0.2 mg/dL (ref 0.0–1.0)
pH: 6 (ref 5.0–8.0)

## 2012-12-09 LAB — URINALYSIS, MICROSCOPIC ONLY
Bacteria, UA: NONE SEEN
RBC / HPF: NONE SEEN RBC/hpf (ref <3)

## 2012-12-09 MED ORDER — ALFUZOSIN HCL ER 10 MG PO TB24: 10.0000 mg | ORAL_TABLET | Freq: Every day | ORAL | Status: DC

## 2012-12-09 MED ORDER — LISINOPRIL 10 MG PO TABS: 10.0000 mg | ORAL_TABLET | Freq: Every day | ORAL | Status: DC

## 2012-12-09 NOTE — Progress Notes (Signed)
Subjective:    Patient ID: Ricky Rios, male    DOB: Nov 25, 1950, 62 y.o.   MRN: [id number]  HPI Patient is here to establish care. Has a history of diabetes mellitus type 2, hypertension, hyperlipidemia, and smoking. He has not been on medications in over a year. He has not been checking his blood sugars regularly. He also complains of one month of increasing nocturia. He is waking up 5-6 times at night. When he does be is a very weak stream. Has any dysuria. He does report some urge incontinence. When he is incontinent it is just a dribble. He denies any fevers chills or back pain. He denies any hematuria. He is also having right lower quadrant abdominal pain. He has a history of an appendectomy. The pain is sharp at times. There is no exacerbating or alleviating factors. He denies any hernia. He denies any melena. He denies any bright red blood per rectum.Marland Kitchen Past Medical History  Diagnosis Date  . Obesity   . Sleep apnea   . Chest pain     Diagnostic cardiac cath October 2012. Normal coronary arteries, left ventricular function  . Right knee injury   . Diabetes mellitus   . Hypertension   . Smoker   . HLD (hyperlipidemia) 12/09/2012   No current outpatient prescriptions on file prior to visit.   No current facility-administered medications on file prior to visit.   Allergies  Allergen Reactions  . Lipitor (Atorvastatin Calcium) Other (See Comments)    shaking  . Sulfa Antibiotics Other (See Comments)    unknown  . Zocor (Simvastatin - High Dose) Other (See Comments)    shaking   History   Social History  . Marital Status: Married    Spouse Name: N/A    Number of Children: N/A  . Years of Education: N/A   Occupational History  . Not on file.   Social History Main Topics  . Smoking status: Current Every Day Smoker  . Smokeless tobacco: Not on file  . Alcohol Use: No  . Drug Use: No  . Sexually Active:    Other Topics Concern  . Not on file   Social History  Narrative  . No narrative on file      Review of Systems  All other systems reviewed and are negative.       Objective:   Physical Exam  Vitals reviewed. Constitutional: He appears well-developed and well-nourished.  HENT:  Head: Normocephalic.  Right Ear: External ear normal.  Mouth/Throat: Oropharynx is clear and moist.  Neck: Neck supple. No thyromegaly present.  Cardiovascular: Normal rate, regular rhythm and normal heart sounds.   No murmur heard. Pulmonary/Chest: Effort normal and breath sounds normal. No respiratory distress. He has no wheezes. He has no rales.  Abdominal: Soft. Bowel sounds are normal. He exhibits no distension. There is no tenderness. There is no rebound.  Genitourinary: Rectum normal, prostate normal and penis normal. No penile tenderness.  Lymphadenopathy:    He has no cervical adenopathy.  Skin: Skin is warm and dry. No rash noted. No erythema. No pallor.   patient has a large pannus.  I cannot elicit the cause of his pain on examination of his right lower quadrant. There is no palpable inguinal hernia.  There are no lymph nodes palpable in the anal canal.        Assessment & Plan:  1. Dysuria Patient symptoms sound more like BPH rather than prostatitis. We'll try uroxatral xr 10 mg by  mouth daily and recheck in 2-4 weeks.  - Urinalysis, Routine w reflex microscopic - PSA; Future  2. Type II or unspecified type diabetes mellitus without mention of complication, uncontrolled Have the patient come back and check a hemoglobin A1c. His goal A1c less than 6.5. It is higher than 6.5 I would recommend metformin as long as his renal function will allow.  Did recommend aspirin 81 mg by mouth daily. Also recommended smoking cessation. - Hemoglobin A1c; Future  3. HLD (hyperlipidemia) Goal LDL would be less than 100. - Basic Metabolic Panel; Future - Hemoglobin A1c; Future - Lipid Panel; Future  4. HTN (hypertension) Blood pressure is at goal but  I would recommend beginning lisinopril 10 mg by mouth daily for renal protection for diabetes. - Basic Metabolic Panel; Future - CBC with Differential; Future - Hepatic Function Panel; Future - Hemoglobin A1c; Future - Lipid Panel; Future Once I know the results of his lab work, like to schedule the patient for a CAT scan of the abdomen and pelvis to evaluate a cause for his right lower quadrant abdominal pain.  Concern this could be adhesions in his abdomen versus a inguinal hernia that I cannot palpate.

## 2012-12-10 ENCOUNTER — Telehealth: Payer: Self-pay | Admitting: Family Medicine

## 2012-12-10 NOTE — Telephone Encounter (Signed)
Pt is stating that either the lisinopril or alfuzosin you had put him on is making him sick.he is c/o dizziness, nausea, and diarrhea and fainted this am at work. He took both meds after dinner last pm. What should he do?

## 2012-12-10 NOTE — Telephone Encounter (Signed)
Stop Both medications and see if symptoms resolve. Call back in 48 hours if symptoms improved and we will retry lisinopril.  If symptoms do not improve he needs to be seen because it is not due to medication

## 2012-12-10 NOTE — Telephone Encounter (Signed)
Pt aware of results spoke to wife

## 2013-02-06 ENCOUNTER — Emergency Department (HOSPITAL_COMMUNITY): Payer: No Typology Code available for payment source

## 2013-02-06 ENCOUNTER — Encounter (HOSPITAL_COMMUNITY)
Admission: EM | Disposition: A | Discharge status: Home / Self Care | Payer: Self-pay | Source: Home / Self Care | Attending: Emergency Medicine

## 2013-02-06 ENCOUNTER — Encounter (HOSPITAL_COMMUNITY): Payer: Self-pay

## 2013-02-06 ENCOUNTER — Emergency Department (HOSPITAL_COMMUNITY)
Admission: EM | Admit: 2013-02-06 | Discharge: 2013-02-06 | Disposition: A | Discharge status: Home / Self Care | Payer: No Typology Code available for payment source | Attending: Emergency Medicine | Admitting: Emergency Medicine

## 2013-02-06 ENCOUNTER — Encounter (HOSPITAL_COMMUNITY): Payer: Self-pay | Admitting: Anesthesiology

## 2013-02-06 ENCOUNTER — Emergency Department (HOSPITAL_COMMUNITY): Payer: No Typology Code available for payment source | Admitting: Anesthesiology

## 2013-02-06 DIAGNOSIS — M7989 Other specified soft tissue disorders: Secondary | ICD-10-CM

## 2013-02-06 DIAGNOSIS — L02519 Cutaneous abscess of unspecified hand: Secondary | ICD-10-CM | POA: Insufficient documentation

## 2013-02-06 DIAGNOSIS — L03019 Cellulitis of unspecified finger: Secondary | ICD-10-CM | POA: Insufficient documentation

## 2013-02-06 HISTORY — PX: I & D EXTREMITY: SHX5045

## 2013-02-06 LAB — CBC
HCT: 38.7 % — ABNORMAL LOW (ref 39.0–52.0)
Hemoglobin: 12.9 g/dL — ABNORMAL LOW (ref 13.0–17.0)
MCH: 29.7 pg (ref 26.0–34.0)
MCHC: 33.3 g/dL (ref 30.0–36.0)
MCV: 89 fL (ref 78.0–100.0)
Platelets: 128 10*3/uL — ABNORMAL LOW (ref 150–400)
RBC: 4.35 MIL/uL (ref 4.22–5.81)
RDW: 14.9 % (ref 11.5–15.5)
WBC: 9.5 10*3/uL (ref 4.0–10.5)

## 2013-02-06 LAB — POCT I-STAT, CHEM 8
BUN: 10 mg/dL (ref 6–23)
Calcium, Ion: 1.21 mmol/L (ref 1.13–1.30)
Creatinine, Ser: 0.9 mg/dL (ref 0.50–1.35)
TCO2: 23 mmol/L (ref 0–100)

## 2013-02-06 LAB — GRAM STAIN

## 2013-02-06 LAB — GLUCOSE, CAPILLARY: Glucose-Capillary: 111 mg/dL — ABNORMAL HIGH (ref 70–99)

## 2013-02-06 SURGERY — IRRIGATION AND DEBRIDEMENT EXTREMITY
Anesthesia: General | Site: Finger | Laterality: Right | Wound class: Dirty or Infected

## 2013-02-06 MED ORDER — MIDAZOLAM HCL 5 MG/5ML IJ SOLN
INTRAMUSCULAR | Status: DC | PRN
  Administered 2013-02-06: 2 mg via INTRAVENOUS

## 2013-02-06 MED ORDER — LACTATED RINGERS IV SOLN
INTRAVENOUS | Status: DC
  Administered 2013-02-06: 18:00:00 via INTRAVENOUS

## 2013-02-06 MED ORDER — ACETAMINOPHEN 10 MG/ML IV SOLN: 1000.0000 mg | Freq: Once | INTRAVENOUS | Status: DC | PRN

## 2013-02-06 MED ORDER — HYDROMORPHONE HCL PF 1 MG/ML IJ SOLN: 0.2500 mg | INTRAMUSCULAR | Status: DC | PRN

## 2013-02-06 MED ORDER — DIPHENHYDRAMINE HCL 50 MG/ML IJ SOLN
INTRAMUSCULAR | Status: DC | PRN
  Administered 2013-02-06: 12.5 mg via INTRAVENOUS

## 2013-02-06 MED ORDER — PROPOFOL 10 MG/ML IV BOLUS
INTRAVENOUS | Status: DC | PRN
  Administered 2013-02-06: 140 mg via INTRAVENOUS

## 2013-02-06 MED ORDER — DOXYCYCLINE HYCLATE 50 MG PO CAPS: 100.0000 mg | ORAL_CAPSULE | Freq: Two times a day (BID) | ORAL | Status: DC

## 2013-02-06 MED ORDER — VANCOMYCIN HCL IN DEXTROSE 1-5 GM/200ML-% IV SOLN
INTRAVENOUS | Status: AC
  Administered 2013-02-06: 1000 mg via INTRAVENOUS
  Filled 2013-02-06: qty 200

## 2013-02-06 MED ORDER — FENTANYL CITRATE 0.05 MG/ML IJ SOLN
INTRAMUSCULAR | Status: DC | PRN
  Administered 2013-02-06: 50 ug via INTRAVENOUS
  Administered 2013-02-06: 25 ug via INTRAVENOUS

## 2013-02-06 MED ORDER — BUPIVACAINE HCL (PF) 0.25 % IJ SOLN
INTRAMUSCULAR | Status: DC | PRN
  Administered 2013-02-06: 9.5 mL

## 2013-02-06 MED ORDER — ONDANSETRON HCL 4 MG/2ML IJ SOLN
INTRAMUSCULAR | Status: DC | PRN
  Administered 2013-02-06: 4 mg via INTRAVENOUS

## 2013-02-06 MED ORDER — OXYCODONE-ACETAMINOPHEN 5-325 MG PO TABS: ORAL_TABLET | ORAL | Status: DC

## 2013-02-06 MED ORDER — SODIUM CHLORIDE 0.9 % IR SOLN
Status: DC | PRN
  Administered 2013-02-06: 1000 mL

## 2013-02-06 MED ORDER — ONDANSETRON HCL 4 MG/2ML IJ SOLN: 4.0000 mg | Freq: Once | INTRAMUSCULAR | Status: DC | PRN

## 2013-02-06 MED ORDER — OXYCODONE-ACETAMINOPHEN 5-325 MG PO TABS
2.0000 | ORAL_TABLET | Freq: Once | ORAL | Status: AC
  Administered 2013-02-06: 2 via ORAL
  Filled 2013-02-06: qty 2

## 2013-02-06 MED ORDER — LIDOCAINE HCL (CARDIAC) 20 MG/ML IV SOLN
INTRAVENOUS | Status: DC | PRN
  Administered 2013-02-06: 20 mg via INTRAVENOUS

## 2013-02-06 SURGICAL SUPPLY — 59 items
BANDAGE COBAN STERILE 2 (GAUZE/BANDAGES/DRESSINGS) IMPLANT
BANDAGE CONFORM 2  STR LF (GAUZE/BANDAGES/DRESSINGS) IMPLANT
BANDAGE ELASTIC 3 VELCRO ST LF (GAUZE/BANDAGES/DRESSINGS) ×2 IMPLANT
BANDAGE ELASTIC 4 VELCRO ST LF (GAUZE/BANDAGES/DRESSINGS) ×2 IMPLANT
BANDAGE GAUZE ELAST BULKY 4 IN (GAUZE/BANDAGES/DRESSINGS) ×2 IMPLANT
BNDG COHESIVE 1X5 TAN STRL LF (GAUZE/BANDAGES/DRESSINGS) IMPLANT
BNDG ESMARK 4X9 LF (GAUZE/BANDAGES/DRESSINGS) ×2 IMPLANT
CLOTH BEACON ORANGE TIMEOUT ST (SAFETY) ×2 IMPLANT
CORDS BIPOLAR (ELECTRODE) ×2 IMPLANT
COVER SURGICAL LIGHT HANDLE (MISCELLANEOUS) ×2 IMPLANT
DECANTER SPIKE VIAL GLASS SM (MISCELLANEOUS) ×2 IMPLANT
DRAIN PENROSE 1/4X12 LTX STRL (WOUND CARE) IMPLANT
DRSG ADAPTIC 3X8 NADH LF (GAUZE/BANDAGES/DRESSINGS) IMPLANT
DRSG EMULSION OIL 3X3 NADH (GAUZE/BANDAGES/DRESSINGS) ×2 IMPLANT
DRSG PAD ABDOMINAL 8X10 ST (GAUZE/BANDAGES/DRESSINGS) IMPLANT
FLUID NSS /IRRIG 3000 ML XXX (IV SOLUTION) ×2 IMPLANT
GAUZE PACKING IODOFORM 1/4X5 (PACKING) ×2 IMPLANT
GAUZE XEROFORM 1X8 LF (GAUZE/BANDAGES/DRESSINGS) IMPLANT
GLOVE BIO SURGEON STRL SZ7.5 (GLOVE) ×2 IMPLANT
GLOVE BIOGEL PI IND STRL 6.5 (GLOVE) ×1 IMPLANT
GLOVE BIOGEL PI IND STRL 7.5 (GLOVE) ×2 IMPLANT
GLOVE BIOGEL PI IND STRL 8 (GLOVE) ×1 IMPLANT
GLOVE BIOGEL PI INDICATOR 6.5 (GLOVE) ×1
GLOVE BIOGEL PI INDICATOR 7.5 (GLOVE) ×2
GLOVE BIOGEL PI INDICATOR 8 (GLOVE) ×1
GLOVE SURG SS PI 6.0 STRL IVOR (GLOVE) ×2 IMPLANT
GOWN BRE IMP PREV XXLGXLNG (GOWN DISPOSABLE) ×2 IMPLANT
HANDPIECE INTERPULSE COAX TIP (DISPOSABLE)
KIT BASIN OR (CUSTOM PROCEDURE TRAY) ×2 IMPLANT
KIT ROOM TURNOVER OR (KITS) ×2 IMPLANT
LOOP VESSEL MAXI BLUE (MISCELLANEOUS) IMPLANT
LOOP VESSEL MINI RED (MISCELLANEOUS) IMPLANT
MANIFOLD NEPTUNE II (INSTRUMENTS) ×2 IMPLANT
NEEDLE HYPO 25X1 1.5 SAFETY (NEEDLE) IMPLANT
NS IRRIG 1000ML POUR BTL (IV SOLUTION) ×2 IMPLANT
PACK ORTHO EXTREMITY (CUSTOM PROCEDURE TRAY) ×2 IMPLANT
PAD ARMBOARD 7.5X6 YLW CONV (MISCELLANEOUS) ×4 IMPLANT
SCRUB BETADINE 4OZ XXX (MISCELLANEOUS) IMPLANT
SET HNDPC FAN SPRY TIP SCT (DISPOSABLE) IMPLANT
SOLUTION BETADINE 4OZ (MISCELLANEOUS) IMPLANT
SPLINT PLASTER EXTRA FAST 3X15 (CAST SUPPLIES) ×1
SPLINT PLASTER GYPS XFAST 3X15 (CAST SUPPLIES) ×1 IMPLANT
SPONGE GAUZE 4X4 12PLY (GAUZE/BANDAGES/DRESSINGS) ×2 IMPLANT
SPONGE LAP 18X18 X RAY DECT (DISPOSABLE) IMPLANT
SPONGE LAP 4X18 X RAY DECT (DISPOSABLE) ×2 IMPLANT
SUCTION FRAZIER TIP 10 FR DISP (SUCTIONS) ×2 IMPLANT
SUT ETHILON 4 0 PS 2 18 (SUTURE) ×2 IMPLANT
SUT MON AB 5-0 P3 18 (SUTURE) IMPLANT
SWAB CULTURE LIQUID MINI MALE (MISCELLANEOUS) ×2 IMPLANT
SYR 20ML ECCENTRIC (SYRINGE) ×2 IMPLANT
SYR CONTROL 10ML LL (SYRINGE) IMPLANT
TOWEL OR 17X24 6PK STRL BLUE (TOWEL DISPOSABLE) ×2 IMPLANT
TOWEL OR 17X26 10 PK STRL BLUE (TOWEL DISPOSABLE) ×2 IMPLANT
TUBE ANAEROBIC SPECIMEN COL (MISCELLANEOUS) ×2 IMPLANT
TUBE CONNECTING 12X1/4 (SUCTIONS) ×2 IMPLANT
TUBE FEEDING 5FR 15 INCH (TUBING) ×2 IMPLANT
UNDERPAD 30X30 INCONTINENT (UNDERPADS AND DIAPERS) ×2 IMPLANT
WATER STERILE IRR 1000ML POUR (IV SOLUTION) ×2 IMPLANT
YANKAUER SUCT BULB TIP NO VENT (SUCTIONS) IMPLANT

## 2013-02-06 NOTE — ED Provider Notes (Signed)
Medical screening examination/treatment/procedure(s) were performed by non-physician practitioner and as supervising physician I was immediately available for consultation/collaboration.   Ashby Dawes, MD 02/06/13 [phone]

## 2013-02-06 NOTE — ED Provider Notes (Signed)
History    CSN: [id number] Arrival date & time 02/06/13  1206  First MD Initiated Contact with Patient 02/06/13 1225     Chief Complaint  Patient presents with  . Finger Injury   (Consider location/radiation/quality/duration/timing/severity/associated sxs/prior Treatment) HPI Pt is a 62yo male presenting today with right index finger pain, swelling and redness that has gradually worsened over the last 2 weeks.  Pt states pain feels like pins and needles, 10/10, constant and throbbing.  Has tried ice which made it hurt more, Advil w/o relief, and warm water which did help some.  Pt states he is unable to flex his index finger.  Reports he thinks he has psoriasis on his hands.  Has seen 2 dermatologist, one told him eczema, the other told him psoriasis however pt is not on any medication for dermatology symptoms.  Denies fever, n/v/d.      Past Medical History  Diagnosis Date  . Obesity   . Sleep apnea   . Chest pain     Diagnostic cardiac cath October 2012. Normal coronary arteries, left ventricular function  . Right knee injury   . Diabetes mellitus   . Hypertension   . Smoker   . HLD (hyperlipidemia) 12/09/2012   Past Surgical History  Procedure Laterality Date  . Cardiac catheterization  Oct 2012    nml coronary arteries, aorta, LV fcn  . Back surgery      for ruptured disc lumbar area  . Neck surgery      for ruptured disc  . Appendectomy    . Tonsillectomy     No family history on file. History  Substance Use Topics  . Smoking status: Current Every Day Smoker  . Smokeless tobacco: Not on file  . Alcohol Use: No    Review of Systems  Constitutional: Negative for fever and chills.  Musculoskeletal: Positive for myalgias, joint swelling and arthralgias.       Right index finger   All other systems reviewed and are negative.    Allergies  Lipitor; Sulfa antibiotics; and Zocor  Home Medications   Current Outpatient Rx  Name  Route  Sig  Dispense  Refill   . ibuprofen (ADVIL,MOTRIN) 200 MG tablet   Oral   Take 400-600 mg by mouth every 6 (six) hours as needed for pain.         Marland Kitchen OVER THE COUNTER MEDICATION   Topical   Apply 1 application topically 2 (two) times daily. White cream for hands (psoriasis)          BP 157/68  Pulse 82  Temp(Src) 98.3 F (36.8 C) (Oral)  Resp 18  SpO2 99% Physical Exam  Nursing note and vitals reviewed. Constitutional: He appears well-developed and well-nourished.  HENT:  Head: Normocephalic and atraumatic.  Eyes: Conjunctivae are normal. No scleral icterus.  Neck: Normal range of motion.  Cardiovascular: Normal rate, regular rhythm and normal heart sounds.   Pulmonary/Chest: Effort normal and breath sounds normal. No respiratory distress. He has no wheezes. He has no rales. He exhibits no tenderness.  Musculoskeletal: He exhibits edema and tenderness.  Moderate to severe swelling in right index finger, TTP, erythremic with scaling hard flaking skin around PIP of right index finger and middle finger.  Less severe swelling of middle finger.  Pt is unable to flex index finger at PIP or DIP due to swelling and pain. Able to flex at MCP joint.  Cap refill <2   Neurological: He is alert.  Skin:  Skin is warm and dry.  Psychiatric: He has a normal mood and affect. His behavior is normal.    ED Course  Procedures (including critical care time) Labs Reviewed  CBC - Abnormal; Notable for the following:    Hemoglobin 12.9 (*)    HCT 38.7 (*)    Platelets 128 (*)    All other components within normal limits  POCT I-STAT, CHEM 8 - Abnormal; Notable for the following:    Hemoglobin 12.9 (*)    HCT 38.0 (*)    All other components within normal limits   Dg Finger Index Right  02/06/2013   *RADIOLOGY REPORT*  Clinical Data: Finger injury, swollen tender right index finger for 2 weeks  RIGHT INDEX FINGER 2+V  Comparison: None  Findings: Osseous mineralization normal. Joint spaces preserved. No acute  fracture, dislocation or bone destruction. Significant soft tissue swelling greatest at ulnar border of the middle phalanx.  IMPRESSION: No acute osseous abnormalities.   Original Report Authenticated By: Ulyses Southward, M.D.   No diagnosis found.  MDM  Concern for cellulitis and possible tenosynovitis due to amount of swelling in right index finger with redness and pain.  Will consult hand to come see pt for further recommendation as far as outpatient tx or need for inpatient I&D.  Consulted Dr. [name] who agreed to come see the patient.    3:46 PMPlan is for Dr. [name] to see pt to determine if pt needs to be taken to OR for debriedment or can be discharged home with PO antibiotics and f/u in office.  Signed out to [place] PA-C at shift change.  Pt is NPO and pain managed at this time.     Junius Finner, PA-C 02/06/13 [phone]

## 2013-02-06 NOTE — ED Notes (Signed)
Pt states that he is driving. Instructed to call someone for a ride before pain med can be given.

## 2013-02-06 NOTE — ED Notes (Signed)
One week ago rt. Index finger is red and swollen  Unknown injury unable to move the finger

## 2013-02-06 NOTE — ED Provider Notes (Signed)
Medical screening examination/treatment/procedure(s) were performed by non-physician practitioner and as supervising physician I was immediately available for consultation/collaboration.   Carleene Cooper III, MD 02/06/13 2001

## 2013-02-06 NOTE — ED Notes (Signed)
Dr Kuzma at bedside. 

## 2013-02-06 NOTE — Op Note (Signed)
[id number] 

## 2013-02-06 NOTE — Anesthesia Postprocedure Evaluation (Signed)
  Anesthesia Post-op Note  Patient: Ricky Rios  Procedure(s) Performed: Procedure(s): IRRIGATION AND DEBRIDEMENT INDEX FINGER (Right)  Patient Location: PACU  Anesthesia Type:General  Level of Consciousness: awake, alert , oriented and patient cooperative  Airway and Oxygen Therapy: Patient Spontanous Breathing  Post-op Pain: mild  Post-op Assessment: Post-op Vital signs reviewed, Patient's Cardiovascular Status Stable, Respiratory Function Stable, Patent Airway, No signs of Nausea or vomiting and Pain level controlled  Post-op Vital Signs: Reviewed and stable  Complications: No apparent anesthesia complications

## 2013-02-06 NOTE — Anesthesia Preprocedure Evaluation (Addendum)
Anesthesia Evaluation  Patient identified by MRN, date of birth, ID band Patient awake    Reviewed: Allergy & Precautions, H&P , NPO status , Patient's Chart, lab work & pertinent test results  Airway Mallampati: II      Dental  (+) Edentulous Upper and Edentulous Lower   Pulmonary  breath sounds clear to auscultation        Cardiovascular Rhythm:Regular Rate:Normal     Neuro/Psych    GI/Hepatic   Endo/Other    Renal/GU      Musculoskeletal   Abdominal (+) + obese,   Peds  Hematology   Anesthesia Other Findings   Reproductive/Obstetrics                           Anesthesia Physical Anesthesia Plan  ASA: II and emergent  Anesthesia Plan: General   Post-op Pain Management:    Induction: Intravenous  Airway Management Planned: LMA  Additional Equipment:   Intra-op Plan:   Post-operative Plan:   Informed Consent: I have reviewed the patients History and Physical, chart, labs and discussed the procedure including the risks, benefits and alternatives for the proposed anesthesia with the patient or authorized representative who has indicated his/her understanding and acceptance.     Plan Discussed with: CRNA and Anesthesiologist  Anesthesia Plan Comments: (Obesity Smoker  Diet controlled DM glucose 98 Infection R. index finger Normal coronaries and Normal LV function by cath 05/26/11  Plan GA with LMA)       Anesthesia Quick Evaluation

## 2013-02-06 NOTE — H&P (Signed)
Ricky Rios is an 62 y.o. male.   Chief Complaint: right index infection HPI: 62 yo rhd male states he has had worsening pain and swelling of the right index finger over the past two weeks.  Does not remember any injury.  Does have psoriasis that will itch and bleed.  Finger has become more painful to the point it is difficult to move.  No fevers, chills, night sweats.  Reports no previous problem with finger and no other injury at this time.  Past Medical History  Diagnosis Date  . Obesity   . Sleep apnea   . Chest pain     Diagnostic cardiac cath October 2012. Normal coronary arteries, left ventricular function  . Right knee injury   . Diabetes mellitus   . Hypertension   . Smoker   . HLD (hyperlipidemia) 12/09/2012    Past Surgical History  Procedure Laterality Date  . Cardiac catheterization  Oct 2012    nml coronary arteries, aorta, LV fcn  . Back surgery      for ruptured disc lumbar area  . Neck surgery      for ruptured disc  . Appendectomy    . Tonsillectomy      No family history on file. Social History:  reports that he has been smoking.  He does not have any smokeless tobacco history on file. He reports that he does not drink alcohol or use illicit drugs.  Allergies:  Allergies  Allergen Reactions  . Lipitor (Atorvastatin Calcium) Other (See Comments)    shaking  . Sulfa Antibiotics Other (See Comments)    unknown  . Zocor (Simvastatin - High Dose) Other (See Comments)    shaking     (Not in a hospital admission)  Results for orders placed during the hospital encounter of 02/06/13 (from the past 48 hour(s))  CBC     Status: Abnormal   Collection Time    02/06/13  2:23 PM      Result Value Range   WBC 9.5  4.0 - 10.5 K/uL   RBC 4.35  4.22 - 5.81 MIL/uL   Hemoglobin 12.9 (*) 13.0 - 17.0 g/dL   HCT 81.1 (*) 91.4 - 78.2 %   MCV 89.0  78.0 - 100.0 fL   MCH 29.7  26.0 - 34.0 pg   MCHC 33.3  30.0 - 36.0 g/dL   RDW 95.6  21.3 - 08.6 %   Platelets  128 (*) 150 - 400 K/uL  POCT I-STAT, CHEM 8     Status: Abnormal   Collection Time    02/06/13  2:26 PM      Result Value Range   Sodium 141  135 - 145 mEq/L   Potassium 4.0  3.5 - 5.1 mEq/L   Chloride 107  96 - 112 mEq/L   BUN 10  6 - 23 mg/dL   Creatinine, Ser 5.78  0.50 - 1.35 mg/dL   Glucose, Bld 98  70 - 99 mg/dL   Calcium, Ion 4.69  6.29 - 1.30 mmol/L   TCO2 23  0 - 100 mmol/L   Hemoglobin 12.9 (*) 13.0 - 17.0 g/dL   HCT 52.8 (*) 41.3 - 24.4 %    Dg Finger Index Right  02/06/2013   *RADIOLOGY REPORT*  Clinical Data: Finger injury, swollen tender right index finger for 2 weeks  RIGHT INDEX FINGER 2+V  Comparison: None  Findings: Osseous mineralization normal. Joint spaces preserved. No acute fracture, dislocation or bone destruction. Significant soft  tissue swelling greatest at ulnar border of the middle phalanx.  IMPRESSION: No acute osseous abnormalities.   Original Report Authenticated By: Ulyses Southward, M.D.     A comprehensive review of systems was negative.  Blood pressure 157/68, pulse 82, temperature 98.3 F (36.8 C), temperature source Oral, resp. rate 18, SpO2 99.00%.  General appearance: alert, cooperative and appears stated age Head: Normocephalic, without obvious abnormality, atraumatic Neck: supple, symmetrical, trachea midline Resp: clear to auscultation bilaterally Cardio: regular rate and rhythm GI: non tender Extremities: intact sensation and capillary refill all digits.  +epl/fpl/io.  ttp volarly on finger.  finger very swollen and held flexed.  pain with extension of digit.  no tenderness in palm.  erythema dorso ulnar side of finger.  no proximal streaking. Pulses: 2+ and symmetric Skin: as above Neurologic: Grossly normal Incision/Wound: na  Assessment/Plan Right index finger infection including flexor sheath.  Non operative and operative treatment options were discussed with the patient and patient wishes to proceed with operative treatment. Recommend  OR for I&D of finger and flexor sheath.  Risks, benefits, and alternatives of surgery were discussed and the patient agrees with the plan of care.   Ricky Rios 02/06/2013, 5:22 PM

## 2013-02-06 NOTE — Brief Op Note (Signed)
02/06/2013  7:03 PM  PATIENT:  Cordelia Poche  62 y.o. male  PRE-OPERATIVE DIAGNOSIS:  infected right index finger  POST-OPERATIVE DIAGNOSIS:  infected right index finger  PROCEDURE:  Procedure(s): IRRIGATION AND DEBRIDEMENT INDEX FINGER  SURGEON:  Surgeon(s): Tami Ribas, MD  PHYSICIAN ASSISTANT:   ASSISTANTS: none   ANESTHESIA:   general  EBL:     DRAINS: iodoform packing  LOCAL MEDICATIONS USED:  MARCAINE     SPECIMEN:  Source of Specimen:  right index finger  DISPOSITION OF SPECIMEN:  micro  COUNTS:  YES  TOURNIQUET:   Total Tourniquet Time Documented: Upper Arm (Right) - -[id number] minutes Total: Upper Arm (Right) - -[id number] minutes   DICTATION: .Other Dictation: Dictation Number [id number]  PLAN OF CARE: Discharge to home after PACU

## 2013-02-06 NOTE — Transfer of Care (Signed)
Immediate Anesthesia Transfer of Care Note  Patient: Ricky Rios  Procedure(s) Performed: Procedure(s): IRRIGATION AND DEBRIDEMENT INDEX FINGER (Right)  Patient Location: PACU  Anesthesia Type:General  Level of Consciousness: awake and alert   Airway & Oxygen Therapy: Patient Spontanous Breathing and Patient connected to face mask oxygen  Post-op Assessment: Report given to PACU RN and Post -op Vital signs reviewed and stable  Post vital signs: Reviewed and stable  Complications: No apparent anesthesia complications

## 2013-02-06 NOTE — ED Provider Notes (Signed)
This is a sign out from PA [place] at shift change: Ricky Rios is a 62 y.o. male complaining of pain and swelling to right pointer finger with past medical history significant for psoriasis. Patient is afebrile, has no leukocytosis. Plain films show no osteomyelitis. Ricky Rios will come to evaluate the Pt and decide if he is appropriate for outpatinet vs OR debridement. Presurgical labs ordered. Pt is NPO.   Plan is to control pain and wait for Hand evaluation by Dr. Merlyn Rios.   Patient will be taken to the operating room for incision and drainage.  Ricky Emery, PA-C 02/06/13 1710

## 2013-02-06 NOTE — ED Notes (Signed)
Wife coming to transport pt home.

## 2013-02-06 NOTE — ED Notes (Signed)
Right index finger swollen, red and tender. No known injury.

## 2013-02-06 NOTE — Progress Notes (Signed)
Patient arrived from ED with visible shaking. Patient skin warm and dry. Tabatha, CRNA at bedside and notified

## 2013-02-07 NOTE — Op Note (Signed)
NAME:  TIVIS, WHERRY NO.:  [id number]  MEDICAL RECORD NO.:  [id number]  LOCATION:  [facility]                         FACILITY:  [facility]  PHYSICIAN:  Betha Loa, MD        DATE OF BIRTH:  27-Apr-1951  DATE OF PROCEDURE:  02/06/2013 DATE OF DISCHARGE:                              OPERATIVE REPORT   PREOPERATIVE DIAGNOSIS:  Right index finger abscess with flexor sheath infection.  POSTOPERATIVE DIAGNOSIS:  Right index finger abscess with flexor sheath infection.  PROCEDURE:  Right index finger irrigation and debridement of abscess and flexor sheath.  SURGEON:  Betha Loa, MD  ASSISTANT:  None.  ANESTHESIA:  General.  IV FLUIDS:  Per anesthesia flow sheet.  ESTIMATED BLOOD LOSS:  Minimal.  COMPLICATIONS:  None.  SPECIMENS:  Cultures to Micro.  TOURNIQUET TIME:  41 minutes.  DISPOSITION:  Stable to PACU.  INDICATIONS:  Ricky Rios is a 62 year old right-hand dominant male who states over the past 1-2 weeks, he has been increasing pain and swelling on the finger.  He has never had any specific injuries or wounds.  He presented to [facility] Emergency Department.  He has had no fever, chills, or night sweats.  On examination, he had swollen right index finger that had pain to palpation and pain with extension.  He had pain both volarly and dorsally.  There was a reddened area on the dorsal ulnar side over the middle phalanx.  I discussed Mr. Harriott and his wife the nature of the condition.  I recommended going to the operating room for irrigation and debridement of the potential abscess and flexor sheath infection.  Risks, benefits, and alternatives of the surgery were discussed including the risk of blood loss; infection; damage to nerves, vessels, tendons, ligaments, bone; failure of surgery; need for additional surgery; complications with wound healing; continued pain; continued infection; and need for repair of irrigation and debridement. He voiced  understanding of these risks and elected to proceed.  OPERATIVE COURSE:  After being identified preoperatively by myself, the patient and I agreed upon the procedure and site of procedure.  The surgical site was marked.  The risks, benefits, and alternatives of the surgery were reviewed and he wished to proceed.  Surgical consent had been signed.  He was transferred to the operating room and placed on the operating room table in supine position with the right upper extremity on an armboard.  General anesthesia was induced by anesthesiologist.  He had been given IV vancomycin.  The right upper extremity was prepped and draped in normal sterile orthopedic fashion.  A surgical pause was performed between the surgeons, anesthesia, and operating room staff, and all were in agreement as to the patient, procedure, and the site of procedure.  Tourniquet at the proximal aspect of the extremity was inflated to 250 mmHg after exsanguination of the hand by gravity and the forearm by Esmarch bandage.  Incision was made at the dorsal ulnar side of the index finger in the area of erythema.  This carried into subcutaneous tissues by spreading technique.  There was gross purulence. Cultures were taken for aerobes, anaerobes, AFB, and fungus.  The purulence was squeezed out.  It  was felt that the entire abscess cavity was able to be visualized.  An incision was made over the A1 pulley volarly.  This carried into subcutaneous tissues by spreading technique. The A1 pulley was incised.  There were some fluid within the sheath, but no gross purulence.  An incision was made at the distal phalanx volarly. Again, this was carried down to the flexor tendon.  One additional incision was made over the middle phalanx volarly to ensure there was no purulence at the volar aspect of the middle phalanx, which there was none.  A #5 pediatric feeding tube was placed into the flexor tendon sheath from both proximal and  distal directions.  A 200 mL of sterile saline was flushed through the flexor sheath.  Gritty film was obtained. There was no gross purulence.  The wounds were then all copiously irrigated with 3000 mL of sterile saline by cysto tubing.  The wounds were all packed open with quarter-inch iodoform gauze.  The wounds were injected with 9 mL of 0.25% plain Marcaine to aid in postoperative analgesia.  They were then dressed with sterile Xeroform, 4x4s, and wrapped with a Kerlix bandage.  A volar splint including the index, long, and ring fingers was placed and wrapped with Kerlix and Ace bandage.  Tourniquet was deflated at 41 minutes.  Fingertips were pink with brisk capillary refill after deflation of the tourniquet. Operative drapes were broken down and the patient was awoken from anesthesia safely.  He was transferred back to the stretcher and taken to PACU in stable condition.  I will see him back in the office beginning of next week for postoperative followup.  I will give him Percocet 5/325 one-two p.o. every 6 hours p.r.n. pain, dispensed #40 and doxycycline 100 mg p.o. b.i.d. x7 days.     Betha Loa, MD     KK/MEDQ  D:  02/06/2013  T:  02/07/2013  Job:  [id number]

## 2013-02-09 LAB — CULTURE, ROUTINE-ABSCESS

## 2013-02-10 ENCOUNTER — Encounter (HOSPITAL_COMMUNITY): Payer: Self-pay | Admitting: Orthopedic Surgery

## 2013-02-11 LAB — ANAEROBIC CULTURE

## 2013-05-14 ENCOUNTER — Emergency Department (HOSPITAL_COMMUNITY)
Admission: EM | Admit: 2013-05-14 | Discharge: 2013-05-14 | Disposition: A | Discharge status: Home / Self Care | Payer: No Typology Code available for payment source | Attending: Emergency Medicine | Admitting: Emergency Medicine

## 2013-05-14 ENCOUNTER — Encounter (HOSPITAL_COMMUNITY): Payer: Self-pay | Admitting: Emergency Medicine

## 2013-05-14 DIAGNOSIS — F17200 Nicotine dependence, unspecified, uncomplicated: Secondary | ICD-10-CM | POA: Insufficient documentation

## 2013-05-14 DIAGNOSIS — L408 Other psoriasis: Secondary | ICD-10-CM | POA: Insufficient documentation

## 2013-05-14 DIAGNOSIS — Z79899 Other long term (current) drug therapy: Secondary | ICD-10-CM | POA: Insufficient documentation

## 2013-05-14 DIAGNOSIS — Z9861 Coronary angioplasty status: Secondary | ICD-10-CM | POA: Insufficient documentation

## 2013-05-14 DIAGNOSIS — Z87828 Personal history of other (healed) physical injury and trauma: Secondary | ICD-10-CM | POA: Insufficient documentation

## 2013-05-14 DIAGNOSIS — I10 Essential (primary) hypertension: Secondary | ICD-10-CM | POA: Insufficient documentation

## 2013-05-14 DIAGNOSIS — E119 Type 2 diabetes mellitus without complications: Secondary | ICD-10-CM | POA: Insufficient documentation

## 2013-05-14 DIAGNOSIS — E669 Obesity, unspecified: Secondary | ICD-10-CM | POA: Insufficient documentation

## 2013-05-14 DIAGNOSIS — L409 Psoriasis, unspecified: Secondary | ICD-10-CM

## 2013-05-14 LAB — GLUCOSE, CAPILLARY
Glucose-Capillary: 86 mg/dL (ref 70–99)
Glucose-Capillary: 114 mg/dL — ABNORMAL HIGH (ref 70–99)

## 2013-05-14 MED ORDER — TRIAMCINOLONE ACETONIDE 0.1 % EX CREA: TOPICAL_CREAM | Freq: Four times a day (QID) | CUTANEOUS | Status: DC | PRN

## 2013-05-14 MED ORDER — HYDROXYZINE HCL 25 MG PO TABS: 25.0000 mg | ORAL_TABLET | Freq: Three times a day (TID) | ORAL | Status: DC

## 2013-05-14 MED ORDER — CEPHALEXIN 500 MG PO CAPS: 500.0000 mg | ORAL_CAPSULE | Freq: Four times a day (QID) | ORAL | Status: DC

## 2013-05-14 NOTE — ED Notes (Signed)
PA at bedside.

## 2013-05-14 NOTE — ED Notes (Signed)
Presents with 2 weeks of rash to posterior aspect of hands. Hands rough, chapped and red, reports pain with movement and decreased dexterity. C/o itchiness. Hx of psoriasis.

## 2013-05-14 NOTE — ED Provider Notes (Signed)
CSN: [id number]     Arrival date & time 05/14/13  1903 History  This chart was scribed for Ricky Forth, PA-C, working with Junius Argyle, MD by Blanchard Kelch, ED Scribe. This patient was seen in room TR06C/TR06C and the patient's care was started at 8:45 PM.    Chief Complaint  Patient presents with  . Rash    Patient is a 62 y.o. male presenting with rash. The history is provided by the patient. No language interpreter was used.  Rash Associated symptoms: no fever and not vomiting     HPI Comments: Ricky Rios is a 62 y.o. male who presents to the Emergency Department complaining of a constant, itching rash to his hands bilaterally that began about two weeks ago. He complains of associated swelling in both hands. He denies anything relieves the rash. He states that he has a history of psoriasis. He reports that he only has large patches of psoriasis on his hands however this is worse. He also reports significant itching relieved with Benadryl which makes him very sleepy.Ricky Rios  He denies fever, chills, nausea, vomiting, streaking, increased warmth.   Past Medical History  Diagnosis Date  . Obesity   . Sleep apnea   . Chest pain     Diagnostic cardiac cath October 2012. Normal coronary arteries, left ventricular function  . Right knee injury   . Diabetes mellitus   . Hypertension   . Smoker   . HLD (hyperlipidemia) 12/09/2012   Past Surgical History  Procedure Laterality Date  . Cardiac catheterization  Oct 2012    nml coronary arteries, aorta, LV fcn  . Back surgery      for ruptured disc lumbar area  . Neck surgery      for ruptured disc  . Appendectomy    . Tonsillectomy    . I&d extremity Right 02/06/2013    Procedure: IRRIGATION AND DEBRIDEMENT INDEX FINGER;  Surgeon: Tami Ribas, MD;  Location: [facility] OR;  Service: Orthopedics;  Laterality: Right;   History reviewed. No pertinent family history. History  Substance Use Topics  . Smoking status: Current  Every Day Smoker  . Smokeless tobacco: Not on file  . Alcohol Use: No    Review of Systems  Constitutional: Negative for fever and chills.  HENT: Negative for congestion.   Eyes: Negative for pain.  Respiratory: Negative for cough.   Cardiovascular: Negative for chest pain.  Gastrointestinal: Negative for vomiting.  Musculoskeletal: Negative for gait problem.  Skin: Positive for rash.  Neurological: Negative for speech difficulty.  Hematological: Negative for adenopathy.  Psychiatric/Behavioral: Negative for confusion.  All other systems reviewed and are negative.    Allergies  Lipitor; Sulfa antibiotics; and Zocor  Home Medications   Current Outpatient Rx  Name  Route  Sig  Dispense  Refill  . cephALEXin (KEFLEX) 500 MG capsule   Oral   Take 500 mg by mouth once.         Ricky Rios ibuprofen (ADVIL,MOTRIN) 200 MG tablet   Oral   Take 400-600 mg by mouth every 6 (six) hours as needed for pain.         . Multiple Vitamins-Minerals (AIRBORNE) CHEW   Oral   Chew 1 tablet by mouth daily.         . Multiple Vitamins-Minerals (MULTIVITAMIN WITH MINERALS) tablet   Oral   Take 1 tablet by mouth daily.         . cephALEXin (KEFLEX) 500 MG capsule  Oral   Take 1 capsule (500 mg total) by mouth 4 (four) times daily.   40 capsule   0   . hydrOXYzine (ATARAX/VISTARIL) 25 MG tablet   Oral   Take 1 tablet (25 mg total) by mouth 3 (three) times daily. As needed for itching   21 tablet   0   . triamcinolone cream (KENALOG) 0.1 %   Topical   Apply topically 4 (four) times daily as needed.   30 g   0    Triage Vitals: BP 153/80  Pulse 79  Temp(Src) 97.9 F (36.6 C) (Oral)  Resp 16  Wt 275 lb 7 oz (124.938 kg)  BMI 38.43 kg/m2  SpO2 100%  Physical Exam  Nursing note and vitals reviewed. Constitutional: He appears well-developed and well-nourished. No distress.  Awake, alert, nontoxic appearance  HENT:  Head: Normocephalic and atraumatic.  Mouth/Throat:  Oropharynx is clear and moist. No oropharyngeal exudate.  Eyes: Conjunctivae are normal. No scleral icterus.  Neck: Normal range of motion. Neck supple.  Cardiovascular: Normal rate, regular rhythm and intact distal pulses.   Pulmonary/Chest: Effort normal and breath sounds normal. No respiratory distress. He has no wheezes.  Abdominal: Soft. Bowel sounds are normal. He exhibits no mass. There is no tenderness. There is no rebound and no guarding.  Musculoskeletal: Normal range of motion. He exhibits no edema.  Mildly decreased ROM of DIP and PIP joints and MCP joints of hands secondary to swelling. No pitting edema.  Neurological: He is alert.  Speech is clear and goal oriented Moves extremities without ataxia  Skin: Skin is warm and dry. Rash noted. He is not diaphoretic.  Dorsum of hands bilaterally have inflamed skin that is salmon colored with silver white plaques and scales and edmatous. Silimar patches on extensor surface of knees and elbows bilaterally.  No induration, evidence of abscess or streaking  Psychiatric: He has a normal mood and affect.    ED Course  Procedures (including critical care time)  DIAGNOSTIC STUDIES: Oxygen Saturation is 100% on room air, normal by my interpretation.    COORDINATION OF CARE: 8:47 PM -Clinical suspicion of psoriasis on his hands. Will order medication for rash. Patient verbalizes understanding and agrees with treatment plan.    Labs Review Labs Reviewed  GLUCOSE, CAPILLARY - Abnormal; Notable for the following:    Glucose-Capillary 114 (*)    All other components within normal limits  GLUCOSE, CAPILLARY   Imaging Review No results found.  EKG Interpretation   None       MDM   1. Psoriasis   2. DM (diabetes mellitus)     Cordelia Poche presents with psoriatic flare and potential psoriatic arthritis. No evidence of cellulitis or septic joint. Patient's largest complaint is itching. Will give Atarax, triamcinolone and  recommend Aquaphor. Patient is to followup with dermatology and primary care to discuss possible initiation of methotrexate and further treatment.  Patient is alert, oriented, nontoxic, nonseptic appearing.  It has been determined that no acute conditions requiring further emergency intervention are present at this time. The patient/guardian have been advised of the diagnosis and plan. We have discussed signs and symptoms that warrant return to the ED, such as changes or worsening in symptoms.   Vital signs are stable at discharge.   BP 138/83  Pulse 71  Temp(Src) 97.9 F (36.6 C) (Oral)  Resp 12  Wt 275 lb 7 oz (124.938 kg)  BMI 38.43 kg/m2  SpO2 95%  Patient/guardian has voiced  understanding and agreed to follow-up with the PCP or specialist.    I personally performed the services described in this documentation, which was scribed in my presence. The recorded information has been reviewed and is accurate.   Dahlia Client Paeton Latouche, PA-C 05/14/13 2129

## 2013-05-15 NOTE — ED Provider Notes (Signed)
Medical screening examination/treatment/procedure(s) were performed by non-physician practitioner and as supervising physician I was immediately available for consultation/collaboration.   Junius Argyle, MD 05/15/13 1155

## 2013-05-19 ENCOUNTER — Encounter (HOSPITAL_COMMUNITY): Payer: Self-pay | Admitting: Emergency Medicine

## 2013-05-19 ENCOUNTER — Emergency Department (HOSPITAL_COMMUNITY)
Admission: EM | Admit: 2013-05-19 | Discharge: 2013-05-19 | Disposition: A | Discharge status: Home / Self Care | Payer: No Typology Code available for payment source | Attending: Emergency Medicine | Admitting: Emergency Medicine

## 2013-05-19 DIAGNOSIS — E119 Type 2 diabetes mellitus without complications: Secondary | ICD-10-CM | POA: Insufficient documentation

## 2013-05-19 DIAGNOSIS — R2232 Localized swelling, mass and lump, left upper limb: Secondary | ICD-10-CM

## 2013-05-19 DIAGNOSIS — L408 Other psoriasis: Secondary | ICD-10-CM | POA: Insufficient documentation

## 2013-05-19 DIAGNOSIS — Z79899 Other long term (current) drug therapy: Secondary | ICD-10-CM | POA: Insufficient documentation

## 2013-05-19 DIAGNOSIS — I10 Essential (primary) hypertension: Secondary | ICD-10-CM | POA: Insufficient documentation

## 2013-05-19 DIAGNOSIS — L409 Psoriasis, unspecified: Secondary | ICD-10-CM

## 2013-05-19 DIAGNOSIS — Z87828 Personal history of other (healed) physical injury and trauma: Secondary | ICD-10-CM | POA: Insufficient documentation

## 2013-05-19 DIAGNOSIS — F17200 Nicotine dependence, unspecified, uncomplicated: Secondary | ICD-10-CM | POA: Insufficient documentation

## 2013-05-19 DIAGNOSIS — E669 Obesity, unspecified: Secondary | ICD-10-CM | POA: Insufficient documentation

## 2013-05-19 DIAGNOSIS — Z9889 Other specified postprocedural states: Secondary | ICD-10-CM | POA: Insufficient documentation

## 2013-05-19 DIAGNOSIS — Z792 Long term (current) use of antibiotics: Secondary | ICD-10-CM | POA: Insufficient documentation

## 2013-05-19 DIAGNOSIS — R229 Localized swelling, mass and lump, unspecified: Secondary | ICD-10-CM | POA: Insufficient documentation

## 2013-05-19 LAB — GLUCOSE, CAPILLARY: Glucose-Capillary: 140 mg/dL — ABNORMAL HIGH (ref 70–99)

## 2013-05-19 MED ORDER — CLINDAMYCIN HCL 150 MG PO CAPS: 300.0000 mg | ORAL_CAPSULE | Freq: Three times a day (TID) | ORAL | Status: DC

## 2013-05-19 NOTE — ED Notes (Signed)
Pt c/o left hand pain from psoriasis that now has raised red area that is small to hand

## 2013-05-19 NOTE — ED Provider Notes (Signed)
CSN: [id number]     Arrival date & time 05/19/13  1425 History  This chart was scribed for non-physician practitioner Coral Ceo, PA-C, working with Roney Marion, MD by Dorothey Baseman, ED Scribe. This patient was seen in room TR05C/TR05C and the patient's care was started at 3:40 PM.    Chief Complaint  Patient presents with  . Hand Pain   The history is provided by the patient. No language interpreter was used.   HPI Comments: Ricky Rios is a 62 y.o. male with a history of psoriasis, DM, HTN, HLD, and sleep apnea who presents to the Emergency Department complaining of a constant pain to the left hand.  Patient states he has had a worsening red raised bump on the dorsum of the left hand for the past 3 days. He denies any potential injury to the area. Patient reports that he was seen here 5 days ago (05/14/13) for bilateral hand pain and discharged with Keflex, Atarax, and topical Kenalog cream with relief, but that the the raised area presented after. Patient reports swelling to the fingers that is normal for him due to his psoriasis and denies any recent changes. He denies fever, numbness, weakness, abdominal pain, nausea, emesis, or loss of sensation.  Patient states that he had an infection in his right hand in the past (01/2013), which required hand surgery (Dr. [name]).  He denies any similar presentation to this today.     Past Medical History  Diagnosis Date  . Obesity   . Sleep apnea   . Chest pain     Diagnostic cardiac cath October 2012. Normal coronary arteries, left ventricular function  . Right knee injury   . Diabetes mellitus   . Hypertension   . Smoker   . HLD (hyperlipidemia) 12/09/2012   Past Surgical History  Procedure Laterality Date  . Cardiac catheterization  Oct 2012    nml coronary arteries, aorta, LV fcn  . Back surgery      for ruptured disc lumbar area  . Neck surgery      for ruptured disc  . Appendectomy    . Tonsillectomy    . I&d extremity Right  02/06/2013    Procedure: IRRIGATION AND DEBRIDEMENT INDEX FINGER;  Surgeon: Tami Ribas, MD;  Location: [facility] OR;  Service: Orthopedics;  Laterality: Right;   History reviewed. No pertinent family history. History  Substance Use Topics  . Smoking status: Current Every Day Smoker  . Smokeless tobacco: Not on file  . Alcohol Use: No    Review of Systems  Constitutional: Negative for fever, chills, diaphoresis, activity change, appetite change and fatigue.  HENT: Negative for congestion.   Eyes: Negative for visual disturbance.  Respiratory: Negative for cough and shortness of breath.   Cardiovascular: Negative for chest pain.  Gastrointestinal: Negative for nausea, vomiting and abdominal pain.  Genitourinary: Negative for dysuria.  Musculoskeletal: Positive for joint swelling (at baseline) and myalgias (at baseline). Negative for arthralgias, gait problem and neck pain.  Skin: Positive for color change and rash. Negative for pallor and wound.  Neurological: Negative for weakness, numbness and headaches.  Psychiatric/Behavioral: Negative for confusion.  All other systems reviewed and are negative.    Allergies  Lipitor; Sulfa antibiotics; and Zocor  Home Medications   Current Outpatient Rx  Name  Route  Sig  Dispense  Refill  . cephALEXin (KEFLEX) 500 MG capsule   Oral   Take 500 mg by mouth once.         Marland Kitchen  cephALEXin (KEFLEX) 500 MG capsule   Oral   Take 1 capsule (500 mg total) by mouth 4 (four) times daily.   40 capsule   0   . hydrOXYzine (ATARAX/VISTARIL) 25 MG tablet   Oral   Take 1 tablet (25 mg total) by mouth 3 (three) times daily. As needed for itching   21 tablet   0   . ibuprofen (ADVIL,MOTRIN) 200 MG tablet   Oral   Take 400-600 mg by mouth every 6 (six) hours as needed for pain.         . Multiple Vitamins-Minerals (AIRBORNE) CHEW   Oral   Chew 1 tablet by mouth daily.         . Multiple Vitamins-Minerals (MULTIVITAMIN WITH MINERALS) tablet    Oral   Take 1 tablet by mouth daily.         Marland Kitchen triamcinolone cream (KENALOG) 0.1 %   Topical   Apply topically 4 (four) times daily as needed.   30 g   0    Triage Vitals: BP 153/82  Pulse 100  Temp(Src) 97.8 F (36.6 C) (Oral)  Resp 18  SpO2 98%  Filed Vitals:   05/19/13 1433 05/19/13 1602  BP: 153/82 129/73  Pulse: 100 73  Temp: 97.8 F (36.6 C) 97.4 F (36.3 C)  TempSrc: Oral Oral  Resp: 18 16  SpO2: 98% 98%    Physical Exam  Nursing note and vitals reviewed. Constitutional: He is oriented to person, place, and time. He appears well-developed and well-nourished. No distress.  HENT:  Head: Normocephalic and atraumatic.  Eyes: Conjunctivae are normal. Right eye exhibits no discharge. Left eye exhibits no discharge.  Neck: Normal range of motion. Neck supple.  Cardiovascular: Normal rate, regular rhythm and normal heart sounds.  Exam reveals no gallop and no friction rub.   No murmur heard. Radial pulses present bilaterally  Pulmonary/Chest: Effort normal and breath sounds normal. No respiratory distress. He has no wheezes. He has no rales. He exhibits no tenderness.  Abdominal: Soft. He exhibits no distension.  Musculoskeletal: Normal range of motion. He exhibits edema and tenderness.       Arms: Patient able to flex and extend digits of hands bilaterally without limitations.  Grip strength 5/5 bilaterally.    Neurological: He is alert and oriented to person, place, and time.  Sensation intact throughout the hands bilaterally  Skin: Skin is warm and dry. Rash noted. He is not diaphoretic. There is erythema.  Dry cracking hands bilaterally with patches of scaly white plaques present on the dorsum of the hands and fingers throughout.  1 cm x 1 cm firm nodule present on the middle of the dorsum of the left hand with mild overlying erythema.  No open wound or fluctuance.  Area is mildly tender to palpation. Mild non-pitting circumferential edema without erythema to the  digits of the hands bilaterally.    Psychiatric: He has a normal mood and affect. His behavior is normal.    ED Course  Procedures (including critical care time)  DIAGNOSTIC STUDIES: Oxygen Saturation is 98% on room air, normal by my interpretation.    COORDINATION OF CARE: 3:43 PM- Discussed that an incision and drainage will not be necessary today in the ED. Will discharge patient with Clindamycin. Advised patient to continue using the Kenalog and to keep the affected areas moist with the use of Aquaphor.  Labs Review Labs Reviewed - No data to display Imaging Review No results found.  EKG Interpretation  None      Results for orders placed during the hospital encounter of 05/19/13  GLUCOSE, CAPILLARY      Result Value Range   Glucose-Capillary 140 (*) 70 - 99 mg/dL   Comment 1 Notify RN     Comment 2 Call MD NNP PA CNM      MDM   1. Psoriasis    Ricky Rios is a 62 y.o. male with a history of psoriasis, DM, HTN, HLD, and sleep apnea who presents to the Emergency Department complaining of a constant pain to the left hand.  POC glucose ordered.     Etiology of hand pain possibly due to a a developing abscess vs cellulitis.  Lipoma and ganglion cyst is less likely considering there is overlying erythema and tenderness as well as acute onset.  Patient has a history of psoriasis and was seen in the ED a few days ago (05/14/13) for this.  He was prescribed Keflex, Atarax, and Kenalog cream at that time.  He states that he was taking the medication with some relief until three days ago when he developed a knot on his left hand. Patient has evidence of dry cracking hands from his psoriasis which may have been the source of a developing infection.  Patient was switched to clindamycin.  Patient allergic to Bactrim.  I did not feel that I&D ws indicated at this time as there is no area of fluctuance.  He was encouraged to continue to use the Kenalog cream and atarax as needed  for itching.  He was encouraged to start using Aquaphor.  He was instructed to follow-up with his PCP for wound re-check in 2-3 days.  He was also instructed to follow-up with a dermatologist regarding his psoriasis.  He was instructed to follow-up with a PCP/return to the ED if he has any spreading redness/swelling, drainage, severe pain, fever, weakness, loss of sensation, or other concerns. Patient was in agreement with discharge and plan.     Final impressions: 1. Psoriasis, hands  2. Left hand mass     Greer Ee Ricky Stfort PA-C           Jillyn Ledger, PA-C 05/22/13 1044

## 2013-05-24 NOTE — ED Provider Notes (Signed)
Medical screening examination/treatment/procedure(s) were performed by non-physician practitioner and as supervising physician I was immediately available for consultation/collaboration.  EKG Interpretation   None         Conita Amenta J Kagan Hietpas, MD 05/24/13 1117 

## 2013-06-03 ENCOUNTER — Other Ambulatory Visit: Payer: Self-pay

## 2013-09-10 ENCOUNTER — Emergency Department (INDEPENDENT_AMBULATORY_CARE_PROVIDER_SITE_OTHER)
Admission: EM | Admit: 2013-09-10 | Discharge: 2013-09-10 | Disposition: A | Discharge status: Home / Self Care | Payer: Self-pay | Source: Home / Self Care | Attending: Emergency Medicine | Admitting: Emergency Medicine

## 2013-09-10 ENCOUNTER — Encounter (HOSPITAL_COMMUNITY): Payer: Self-pay | Admitting: Emergency Medicine

## 2013-09-10 DIAGNOSIS — L03211 Cellulitis of face: Secondary | ICD-10-CM

## 2013-09-10 DIAGNOSIS — L0201 Cutaneous abscess of face: Secondary | ICD-10-CM

## 2013-09-10 LAB — GLUCOSE, CAPILLARY: GLUCOSE-CAPILLARY: 111 mg/dL — AB (ref 70–99)

## 2013-09-10 MED ORDER — CEPHALEXIN 500 MG PO CAPS: 500.0000 mg | ORAL_CAPSULE | Freq: Four times a day (QID) | ORAL | Status: DC

## 2013-09-10 MED ORDER — SODIUM CHLORIDE 0.9 % IV SOLN: Freq: Once | INTRAVENOUS | Status: DC

## 2013-09-10 NOTE — ED Provider Notes (Signed)
CSN: [id number]     Arrival date & time 09/10/13  0920 History   First MD Initiated Contact with Patient 09/10/13 [phone]     Chief Complaint  Patient presents with  . Facial Swelling     (Consider location/radiation/quality/duration/timing/severity/associated sxs/prior Treatment) HPI Comments: Patient presents with left sided tender facial swelling that began on 09/08/2013 without injury. Denies previous episodes. Is edentulous and wears upper and lower dentures. No issues with gum tissues under plates. Some radiation of discomfort to left ear and left lateral neck with slightly tender anterior cervical lymph node. No fever or sore throat. No difficulty breathing, speaking or swallowing. Patient is smoker PCP: none  The history is provided by the patient.    Past Medical History  Diagnosis Date  . Obesity   . Sleep apnea   . Chest pain     Diagnostic cardiac cath October 2012. Normal coronary arteries, left ventricular function  . Right knee injury   . Diabetes mellitus   . Hypertension   . Smoker   . HLD (hyperlipidemia) 12/09/2012   Past Surgical History  Procedure Laterality Date  . Cardiac catheterization  Oct 2012    nml coronary arteries, aorta, LV fcn  . Back surgery      for ruptured disc lumbar area  . Neck surgery      for ruptured disc  . Appendectomy    . Tonsillectomy    . I&d extremity Right 02/06/2013    Procedure: IRRIGATION AND DEBRIDEMENT INDEX FINGER;  Surgeon: Tennis Must, MD;  Location: [facility];  Service: Orthopedics;  Laterality: Right;   No family history on file. History  Substance Use Topics  . Smoking status: Current Every Day Smoker  . Smokeless tobacco: Not on file  . Alcohol Use: No    Review of Systems  All other systems reviewed and are negative.      Allergies  Lipitor; Sulfa antibiotics; and Zocor  Home Medications   Current Outpatient Rx  Name  Route  Sig  Dispense  Refill  . Ascorbic Acid (VITAMIN C PO)   Oral   Take 1  tablet by mouth daily. Air Born Vitamin C         . cephALEXin (KEFLEX) 500 MG capsule   Oral   Take 1 capsule (500 mg total) by mouth 4 (four) times daily.   40 capsule   0   . clindamycin (CLEOCIN) 150 MG capsule   Oral   Take 2 capsules (300 mg total) by mouth 3 (three) times daily. May dispense as 150mg  capsules   60 capsule   0   . hydrOXYzine (ATARAX/VISTARIL) 25 MG tablet   Oral   Take 1 tablet (25 mg total) by mouth 3 (three) times daily. As needed for itching   21 tablet   0   . ibuprofen (ADVIL,MOTRIN) 200 MG tablet   Oral   Take 400-600 mg by mouth every 6 (six) hours as needed for pain.         . Multiple Vitamins-Minerals (MULTIVITAMIN WITH MINERALS) tablet   Oral   Take 1 tablet by mouth daily.         Marland Kitchen triamcinolone cream (KENALOG) 0.1 %   Topical   Apply 1 application topically 4 (four) times daily as needed (for rash).          BP 147/85  Pulse 66  Temp(Src) 97.2 F (36.2 C) (Oral)  Resp 18  SpO2 97% Physical Exam  Nursing note  and vitals reviewed. Constitutional: He is oriented to person, place, and time. He appears well-developed and well-nourished. No distress.  +obese  HENT:  Head: Normocephalic and atraumatic.    Right Ear: Hearing, tympanic membrane, external ear and ear canal normal.  Left Ear: Hearing, tympanic membrane, external ear and ear canal normal.  Nose: Nose normal.  Mouth/Throat: Uvula is midline, oropharynx is clear and moist and mucous membranes are normal. He has dentures. No oral lesions. No trismus in the jaw. No oropharyngeal exudate.  No palpable areas of fluctuance.   Eyes: Conjunctivae are normal. Right eye exhibits no discharge. Left eye exhibits no discharge. No scleral icterus.  Neck: Normal range of motion. Neck supple. No thyromegaly present.  Cardiovascular: Normal rate.   Pulmonary/Chest: Effort normal and breath sounds normal. No stridor.  Musculoskeletal: Normal range of motion.  Lymphadenopathy:     He has cervical adenopathy.  Neurological: He is alert and oriented to person, place, and time.  Skin: Skin is warm and dry.  Psychiatric: He has a normal mood and affect. His behavior is normal.    ED Course  Procedures (including critical care time) Labs Review Labs Reviewed - No data to display Imaging Review No results found.    MDM   Final diagnoses:  None  Mild facial cellulitis originating from probable hairline folliculitis. Will advise using warm compresses at home to affected area 4 x day until healed and taking cephalexin as prescribed. Advised to return for re-evaluation if symptoms do not begin to improve over next 48-72 hours. Tylenol or ibuprofen as directed on packaging for pain.   [place], [place] 09/10/13 1017

## 2013-09-10 NOTE — ED Notes (Signed)
Pt c/o swelling of left side of face down to neck since yest Mild pain; 4/10... Reports he has been congested Denies f/v/n/d, cold sxs Alert w/no signs of acute distress.

## 2013-09-10 NOTE — Discharge Instructions (Signed)
Cellulitis Cellulitis is an infection of the skin and the tissue beneath it. The infected area is usually red and tender. Cellulitis occurs most often in the arms and lower legs.  CAUSES  Cellulitis is caused by bacteria that enter the skin through cracks or cuts in the skin. The most common types of bacteria that cause cellulitis are Staphylococcus and Streptococcus. SYMPTOMS   Redness and warmth.  Swelling.  Tenderness or pain.  Fever. DIAGNOSIS  Your caregiver can usually determine what is wrong based on a physical exam. Blood tests may also be done. TREATMENT  Treatment usually involves taking an antibiotic medicine. HOME CARE INSTRUCTIONS   Take your antibiotics as directed. Finish them even if you start to feel better.  Keep the infected arm or leg elevated to reduce swelling.  Apply a warm cloth to the affected area up to 4 times per day to relieve pain.  Only take over-the-counter or prescription medicines for pain, discomfort, or fever as directed by your caregiver.  Keep all follow-up appointments as directed by your caregiver. SEEK MEDICAL CARE IF:   You notice red streaks coming from the infected area.  Your red area gets larger or turns dark in color.  Your bone or joint underneath the infected area becomes painful after the skin has healed.  Your infection returns in the same area or another area.  You notice a swollen bump in the infected area.  You develop new symptoms. SEEK IMMEDIATE MEDICAL CARE IF:   You have a fever.  You feel very sleepy.  You develop vomiting or diarrhea.  You have a general ill feeling (malaise) with muscle aches and pains. MAKE SURE YOU:   Understand these instructions.  Will watch your condition.  Will get help right away if you are not doing well or get worse. Document Released: 04/19/2005 Document Revised: 01/09/2012 Document Reviewed: 09/25/2011 [facility] Patient Information 2014 [place].  Apply warm  compresses to affected area 4 x day until healed. If symptoms do not begin to improve over the next 48-72 hours after starting medication, please have yourself re-evaluated.

## 2013-09-11 NOTE — ED Provider Notes (Signed)
Medical screening examination/treatment/procedure(s) were performed by non-physician practitioner and as supervising physician I was immediately available for consultation/collaboration.  Philipp Deputy, M.D.  Harden Mo, MD 09/11/13 [phone]

## 2013-12-17 ENCOUNTER — Encounter (HOSPITAL_COMMUNITY): Payer: Self-pay | Admitting: Emergency Medicine

## 2013-12-17 ENCOUNTER — Emergency Department (HOSPITAL_COMMUNITY)
Admission: EM | Admit: 2013-12-17 | Discharge: 2013-12-17 | Disposition: A | Discharge status: Home / Self Care | Payer: No Typology Code available for payment source | Source: Home / Self Care | Attending: Family Medicine | Admitting: Family Medicine

## 2013-12-17 DIAGNOSIS — M545 Low back pain, unspecified: Secondary | ICD-10-CM

## 2013-12-17 MED ORDER — CYCLOBENZAPRINE HCL 10 MG PO TABS: 10.0000 mg | ORAL_TABLET | Freq: Every evening | ORAL | Status: DC | PRN

## 2013-12-17 MED ORDER — TRAMADOL HCL 50 MG PO TABS: 50.0000 mg | ORAL_TABLET | Freq: Four times a day (QID) | ORAL | Status: DC | PRN

## 2013-12-17 NOTE — ED Provider Notes (Signed)
Ricky Rios is a 63 y.o. male who presents to Urgent Care today for right sided low back pain present for 5 days without injury. No new radiating pain weakness or numbness. No bowel bladder dysfunction. Patient has a history of low back surgery. He's tried ibuprofen which is not helpful. He has been unable to work for the last 2 days because of pain. 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   Past Medical History  Diagnosis Date  . Obesity   . Sleep apnea   . Chest pain     Diagnostic cardiac cath October 2012. Normal coronary arteries, left ventricular function  . Right knee injury   . Diabetes mellitus   . Hypertension   . Smoker   . HLD (hyperlipidemia) 12/09/2012   History  Substance Use Topics  . Smoking status: Current Every Day Smoker  . Smokeless tobacco: Not on file  . Alcohol Use: No   ROS as above Medications: No current facility-administered medications for this encounter.   Current Outpatient Prescriptions  Medication Sig Dispense Refill  . Ascorbic Acid (VITAMIN C PO) Take 1 tablet by mouth daily. Air Born Vitamin C      . cyclobenzaprine (FLEXERIL) 10 MG tablet Take 1 tablet (10 mg total) by mouth at bedtime as needed for muscle spasms.  20 tablet  0  . hydrOXYzine (ATARAX/VISTARIL) 25 MG tablet Take 1 tablet (25 mg total) by mouth 3 (three) times daily. As needed for itching  21 tablet  0  . ibuprofen (ADVIL,MOTRIN) 200 MG tablet Take 400-600 mg by mouth every 6 (six) hours as needed for pain.      . Multiple Vitamins-Minerals (MULTIVITAMIN WITH MINERALS) tablet Take 1 tablet by mouth daily.      . traMADol (ULTRAM) 50 MG tablet Take 1 tablet (50 mg total) by mouth every 6 (six) hours as needed.  15 tablet  0  . triamcinolone cream (KENALOG) 0.1 % Apply 1 application topically 4 (four) times daily as needed (for rash).        Exam:  BP 130/90  Pulse 73  Temp(Src) 98.2 F (36.8 C) (Oral)  Resp 18  SpO2 96% Gen: Well NAD HEENT: EOMI,  MMM Lungs: Normal work of breathing.  CTABL Heart: RRR no MRG Abd: NABS, Soft. NT, ND Exts: Brisk capillary refill, warm and well perfused.  Back: Nontender to spinal midline. Tender palpation right SI joint. Decreased flexion and extension because of pain. Lower extremity strength is intact. Reflexes are diminished but equal bilaterally. Sensation and capillary refill intact distally.  No results found for this or any previous visit (from the past 24 hour(s)). No results found.  Assessment and Plan: 63 y.o. male with lumbago without sciatica. Plan treatment with tramadol and Flexeril. Offer physical therapy. Patient declined. Home exercise program. Followup with sports medicine if not improved.  Discussed warning signs or symptoms. Please see discharge instructions. Patient expresses understanding.    Gregor Hams, MD 12/17/13 1455

## 2013-12-17 NOTE — Discharge Instructions (Signed)
Thank you for coming in today. Come back or go to the emergency room if you notice new weakness new numbness problems walking or bowel or bladder problems.   Back Exercises These exercises may help you when beginning to rehabilitate your injury. Your symptoms may resolve with or without further involvement from your physician, physical therapist or athletic trainer. While completing these exercises, remember:   Restoring tissue flexibility helps normal motion to return to the joints. This allows healthier, less painful movement and activity.  An effective stretch should be held for at least 30 seconds.  A stretch should never be painful. You should only feel a gentle lengthening or release in the stretched tissue. STRETCH  Extension, Prone on Elbows   Lie on your stomach on the floor, a bed will be too soft. Place your palms about shoulder width apart and at the height of your head.  Place your elbows under your shoulders. If this is too painful, stack pillows under your chest.  Allow your body to relax so that your hips drop lower and make contact more completely with the floor.  Hold this position for __________ seconds.  Slowly return to lying flat on the floor. Repeat __________ times. Complete this exercise __________ times per day.  RANGE OF MOTION  Extension, Prone Press Ups   Lie on your stomach on the floor, a bed will be too soft. Place your palms about shoulder width apart and at the height of your head.  Keeping your back as relaxed as possible, slowly straighten your elbows while keeping your hips on the floor. You may adjust the placement of your hands to maximize your comfort. As you gain motion, your hands will come more underneath your shoulders.  Hold this position __________ seconds.  Slowly return to lying flat on the floor. Repeat __________ times. Complete this exercise __________ times per day.  RANGE OF MOTION- Quadruped, Neutral Spine   Assume a hands and  knees position on a firm surface. Keep your hands under your shoulders and your knees under your hips. You may place padding under your knees for comfort.  Drop your head and point your tail bone toward the ground below you. This will round out your low back like an angry cat. Hold this position for __________ seconds.  Slowly lift your head and release your tail bone so that your back sags into a large arch, like an old horse.  Hold this position for __________ seconds.  Repeat this until you feel limber in your low back.  Now, find your "sweet spot." This will be the most comfortable position somewhere between the two previous positions. This is your neutral spine. Once you have found this position, tense your stomach muscles to support your low back.  Hold this position for __________ seconds. Repeat __________ times. Complete this exercise __________ times per day.  STRETCH  Flexion, Single Knee to Chest   Lie on a firm bed or floor with both legs extended in front of you.  Keeping one leg in contact with the floor, bring your opposite knee to your chest. Hold your leg in place by either grabbing behind your thigh or at your knee.  Pull until you feel a gentle stretch in your low back. Hold __________ seconds.  Slowly release your grasp and repeat the exercise with the opposite side. Repeat __________ times. Complete this exercise __________ times per day.  STRETCH - Hamstrings, Standing  Stand or sit and extend your right / left  leg, placing your foot on a chair or foot stool  Keeping a slight arch in your low back and your hips straight forward.  Lead with your chest and lean forward at the waist until you feel a gentle stretch in the back of your right / left knee or thigh. (When done correctly, this exercise requires leaning only a small distance.)  Hold this position for __________ seconds. Repeat __________ times. Complete this stretch __________ times per  day. STRENGTHENING  Deep Abdominals, Pelvic Tilt   Lie on a firm bed or floor. Keeping your legs in front of you, bend your knees so they are both pointed toward the ceiling and your feet are flat on the floor.  Tense your lower abdominal muscles to press your low back into the floor. This motion will rotate your pelvis so that your tail bone is scooping upwards rather than pointing at your feet or into the floor.  With a gentle tension and even breathing, hold this position for __________ seconds. Repeat __________ times. Complete this exercise __________ times per day.  STRENGTHENING  Abdominals, Crunches   Lie on a firm bed or floor. Keeping your legs in front of you, bend your knees so they are both pointed toward the ceiling and your feet are flat on the floor. Cross your arms over your chest.  Slightly tip your chin down without bending your neck.  Tense your abdominals and slowly lift your trunk high enough to just clear your shoulder blades. Lifting higher can put excessive stress on the low back and does not further strengthen your abdominal muscles.  Control your return to the starting position. Repeat __________ times. Complete this exercise __________ times per day.  STRENGTHENING  Quadruped, Opposite UE/LE Lift   Assume a hands and knees position on a firm surface. Keep your hands under your shoulders and your knees under your hips. You may place padding under your knees for comfort.  Find your neutral spine and gently tense your abdominal muscles so that you can maintain this position. Your shoulders and hips should form a rectangle that is parallel with the floor and is not twisted.  Keeping your trunk steady, lift your right hand no higher than your shoulder and then your left leg no higher than your hip. Make sure you are not holding your breath. Hold this position __________ seconds.  Continuing to keep your abdominal muscles tense and your back steady, slowly return to  your starting position. Repeat with the opposite arm and leg. Repeat __________ times. Complete this exercise __________ times per day. Document Released: 07/28/2005 Document Revised: 10/02/2011 Document Reviewed: 10/22/2008 [facility] Patient Information 2014 [place], [place].  Back Pain, Adult Low back pain is very common. About 1 in 5 people have back pain.The cause of low back pain is rarely dangerous. The pain often gets better over time.About half of people with a sudden onset of back pain feel better in just 2 weeks. About 8 in 10 people feel better by 6 weeks.  CAUSES Some common causes of back pain include:  Strain of the muscles or ligaments supporting the spine.  Wear and tear (degeneration) of the spinal discs.  Arthritis.  Direct injury to the back. DIAGNOSIS Most of the time, the direct cause of low back pain is not known.However, back pain can be treated effectively even when the exact cause of the pain is unknown.Answering your caregiver's questions about your overall health and symptoms is one of the most accurate ways to  make sure the cause of your pain is not dangerous. If your caregiver needs more information, he or she may order lab work or imaging tests (X-rays or MRIs).However, even if imaging tests show changes in your back, this usually does not require surgery. HOME CARE INSTRUCTIONS For many people, back pain returns.Since low back pain is rarely dangerous, it is often a condition that people can learn to [facility] their own.   Remain active. It is stressful on the back to sit or stand in one place. Do not sit, drive, or stand in one place for more than 30 minutes at a time. Take short walks on level surfaces as soon as pain allows.Try to increase the length of time you walk each day.  Do not stay in bed.Resting more than 1 or 2 days can delay your recovery.  Do not avoid exercise or work.Your body is made to move.It is not dangerous to be active, even  though your back may hurt.Your back will likely heal faster if you return to being active before your pain is gone.  Pay attention to your body when you bend and lift. Many people have less discomfortwhen lifting if they bend their knees, keep the load close to their bodies,and avoid twisting. Often, the most comfortable positions are those that put less stress on your recovering back.  Find a comfortable position to sleep. Use a firm mattress and lie on your side with your knees slightly bent. If you lie on your back, put a pillow under your knees.  Only take over-the-counter or prescription medicines as directed by your caregiver. Over-the-counter medicines to reduce pain and inflammation are often the most helpful.Your caregiver may prescribe muscle relaxant drugs.These medicines help dull your pain so you can more quickly return to your normal activities and healthy exercise.  Put ice on the injured area.  Put ice in a plastic bag.  Place a towel between your skin and the bag.  Leave the ice on for 15-20 minutes, 03-04 times a day for the first 2 to 3 days. After that, ice and heat may be alternated to reduce pain and spasms.  Ask your caregiver about trying back exercises and gentle massage. This may be of some benefit.  Avoid feeling anxious or stressed.Stress increases muscle tension and can worsen back pain.It is important to recognize when you are anxious or stressed and learn ways to manage it.Exercise is a great option. SEEK MEDICAL CARE IF:  You have pain that is not relieved with rest or medicine.  You have pain that does not improve in 1 week.  You have new symptoms.  You are generally not feeling well. SEEK IMMEDIATE MEDICAL CARE IF:   You have pain that radiates from your back into your legs.  You develop new bowel or bladder control problems.  You have unusual weakness or numbness in your arms or legs.  You develop nausea or vomiting.  You develop  abdominal pain.  You feel faint. Document Released: 07/10/2005 Document Revised: 01/09/2012 Document Reviewed: 11/28/2010 [facility] Patient Information 2014 [place], [place].

## 2013-12-17 NOTE — ED Notes (Signed)
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

## 2013-12-21 ENCOUNTER — Encounter (HOSPITAL_COMMUNITY): Payer: Self-pay | Admitting: Emergency Medicine

## 2013-12-21 ENCOUNTER — Emergency Department (HOSPITAL_COMMUNITY)
Admission: EM | Admit: 2013-12-21 | Discharge: 2013-12-21 | Disposition: A | Discharge status: Home / Self Care | Payer: No Typology Code available for payment source | Attending: Emergency Medicine | Admitting: Emergency Medicine

## 2013-12-21 ENCOUNTER — Emergency Department (HOSPITAL_COMMUNITY): Payer: No Typology Code available for payment source

## 2013-12-21 DIAGNOSIS — R1031 Right lower quadrant pain: Secondary | ICD-10-CM | POA: Insufficient documentation

## 2013-12-21 DIAGNOSIS — IMO0002 Reserved for concepts with insufficient information to code with codable children: Secondary | ICD-10-CM | POA: Insufficient documentation

## 2013-12-21 DIAGNOSIS — R358 Other polyuria: Secondary | ICD-10-CM | POA: Insufficient documentation

## 2013-12-21 DIAGNOSIS — M549 Dorsalgia, unspecified: Secondary | ICD-10-CM | POA: Insufficient documentation

## 2013-12-21 DIAGNOSIS — E119 Type 2 diabetes mellitus without complications: Secondary | ICD-10-CM | POA: Insufficient documentation

## 2013-12-21 DIAGNOSIS — E669 Obesity, unspecified: Secondary | ICD-10-CM | POA: Insufficient documentation

## 2013-12-21 DIAGNOSIS — F17200 Nicotine dependence, unspecified, uncomplicated: Secondary | ICD-10-CM | POA: Insufficient documentation

## 2013-12-21 DIAGNOSIS — R3589 Other polyuria: Secondary | ICD-10-CM | POA: Insufficient documentation

## 2013-12-21 DIAGNOSIS — Z87828 Personal history of other (healed) physical injury and trauma: Secondary | ICD-10-CM | POA: Insufficient documentation

## 2013-12-21 DIAGNOSIS — R109 Unspecified abdominal pain: Secondary | ICD-10-CM

## 2013-12-21 DIAGNOSIS — I10 Essential (primary) hypertension: Secondary | ICD-10-CM | POA: Insufficient documentation

## 2013-12-21 DIAGNOSIS — Z79899 Other long term (current) drug therapy: Secondary | ICD-10-CM | POA: Insufficient documentation

## 2013-12-21 DIAGNOSIS — R110 Nausea: Secondary | ICD-10-CM | POA: Insufficient documentation

## 2013-12-21 DIAGNOSIS — Z9889 Other specified postprocedural states: Secondary | ICD-10-CM | POA: Insufficient documentation

## 2013-12-21 DIAGNOSIS — Z9089 Acquired absence of other organs: Secondary | ICD-10-CM | POA: Insufficient documentation

## 2013-12-21 DIAGNOSIS — R209 Unspecified disturbances of skin sensation: Secondary | ICD-10-CM | POA: Insufficient documentation

## 2013-12-21 HISTORY — DX: Other specified postprocedural states: Z98.890

## 2013-12-21 LAB — URINALYSIS, ROUTINE W REFLEX MICROSCOPIC
BILIRUBIN URINE: NEGATIVE
Glucose, UA: NEGATIVE mg/dL
HGB URINE DIPSTICK: NEGATIVE
Ketones, ur: NEGATIVE mg/dL
Leukocytes, UA: NEGATIVE
Nitrite: NEGATIVE
PH: 7.5 (ref 5.0–8.0)
Protein, ur: 100 mg/dL — AB
SPECIFIC GRAVITY, URINE: 1.012 (ref 1.005–1.030)
Urobilinogen, UA: 1 mg/dL (ref 0.0–1.0)

## 2013-12-21 LAB — COMPREHENSIVE METABOLIC PANEL
ALK PHOS: 89 U/L (ref 39–117)
ALT: 22 U/L (ref 0–53)
AST: 22 U/L (ref 0–37)
Albumin: 3.7 g/dL (ref 3.5–5.2)
BUN: 11 mg/dL (ref 6–23)
CO2: 24 meq/L (ref 19–32)
Calcium: 10 mg/dL (ref 8.4–10.5)
Chloride: 99 mEq/L (ref 96–112)
Creatinine, Ser: 0.75 mg/dL (ref 0.50–1.35)
GFR calc Af Amer: >90 mL/min (ref >90)
GLUCOSE: 155 mg/dL — AB (ref 70–99)
Potassium: 4.2 mEq/L (ref 3.7–5.3)
SODIUM: 138 meq/L (ref 137–147)
TOTAL PROTEIN: 7.8 g/dL (ref 6.0–8.3)
Total Bilirubin: 0.5 mg/dL (ref 0.3–1.2)

## 2013-12-21 LAB — CBC WITH DIFFERENTIAL/PLATELET
BASOS ABS: 0.1 10*3/uL (ref 0.0–0.1)
Basophils Relative: 1 % (ref 0–1)
Eosinophils Absolute: 0.6 10*3/uL (ref 0.0–0.7)
Eosinophils Relative: 6 % — ABNORMAL HIGH (ref 0–5)
HCT: 40.9 % (ref 39.0–52.0)
Hemoglobin: 14.6 g/dL (ref 13.0–17.0)
LYMPHS ABS: 3.3 10*3/uL (ref 0.7–4.0)
LYMPHS PCT: 30 % (ref 12–46)
MCH: 31.5 pg (ref 26.0–34.0)
MCHC: 35.7 g/dL (ref 30.0–36.0)
MCV: 88.3 fL (ref 78.0–100.0)
Monocytes Absolute: 0.4 10*3/uL (ref 0.1–1.0)
Monocytes Relative: 4 % (ref 3–12)
NEUTROS PCT: 59 % (ref 43–77)
Neutro Abs: 6.5 10*3/uL (ref 1.7–7.7)
PLATELETS: 120 10*3/uL — AB (ref 150–400)
RBC: 4.63 MIL/uL (ref 4.22–5.81)
RDW: 14.6 % (ref 11.5–15.5)
WBC: 10.8 10*3/uL — AB (ref 4.0–10.5)

## 2013-12-21 LAB — URINE MICROSCOPIC-ADD ON

## 2013-12-21 MED ORDER — OXYCODONE-ACETAMINOPHEN 5-325 MG PO TABS: 1.0000 | ORAL_TABLET | Freq: Four times a day (QID) | ORAL | Status: DC | PRN

## 2013-12-21 MED ORDER — HYDROMORPHONE HCL PF 1 MG/ML IJ SOLN
1.0000 mg | Freq: Once | INTRAMUSCULAR | Status: AC
  Administered 2013-12-21: 1 mg via INTRAVENOUS
  Filled 2013-12-21: qty 1

## 2013-12-21 MED ORDER — ONDANSETRON HCL 4 MG/2ML IJ SOLN
4.0000 mg | Freq: Once | INTRAMUSCULAR | Status: AC
  Administered 2013-12-21: 4 mg via INTRAVENOUS
  Filled 2013-12-21: qty 2

## 2013-12-21 MED ORDER — NAPROXEN 500 MG PO TABS: 500.0000 mg | ORAL_TABLET | Freq: Two times a day (BID) | ORAL | Status: DC

## 2013-12-21 MED ORDER — METHOCARBAMOL 500 MG PO TABS
1000.0000 mg | ORAL_TABLET | Freq: Four times a day (QID) | ORAL | Status: DC
Start: 2013-12-21 — End: 2014-01-31

## 2013-12-21 NOTE — Discharge Instructions (Signed)
Please read and follow all provided instructions.  Your diagnoses today include:  1. Back pain   2. Abdominal pain     Tests performed today include:  Vital signs - see below for your results today  Blood counts and electrolytes - slightly high blood sugar  Urine test - no blood or infection  CT scan -- no kidney stones. 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 Shows that you have changes in your liver and spleen which are concerning for liver cirrhosis. You should let your primary care doctor know about this.   Medications prescribed:   Percocet (oxycodone/acetaminophen) - narcotic pain medication  DO NOT drive or perform any activities that require you to be awake and alert because this medicine can make you drowsy. BE VERY CAREFUL not to take multiple medicines containing Tylenol (also called acetaminophen). Doing so can lead to an overdose which can damage your liver and cause liver failure and possibly death.   Robaxin (methocarbamol) - muscle relaxer medication  DO NOT drive or perform any activities that require you to be awake and alert because this medicine can make you drowsy.    Naproxen - anti-inflammatory pain medication  Do not exceed 500mg  naproxen every 12 hours, take with food  You have been prescribed an anti-inflammatory medication or NSAID. Take with food. Take smallest effective dose for the shortest duration needed for your pain. Stop taking if you experience stomach pain or vomiting.   Take any prescribed medications only as directed.  Home care instructions:   Follow any educational materials contained in this packet  Please rest, use ice or heat on your back for the next several days  Do not lift, push, pull anything more than 10 pounds for the next week  Follow-up instructions: Please follow-up with your primary care provider in the next 3 days for further evaluation of your symptoms. If you do not have a primary care doctor -- see below for referral  information.   Return instructions:  SEEK IMMEDIATE MEDICAL ATTENTION IF YOU HAVE:  New numbness, tingling, weakness, or problem with the use of your arms or legs  Severe back pain not relieved with medications  Worsening abdominal pain, fever, or vomiting  Loss control of your bowels or bladder  Increasing pain in any areas of the body (such as chest or abdominal pain)  Shortness of breath, dizziness, or fainting.   Worsening nausea (feeling sick to your stomach), vomiting, fever, or sweats  Any other emergent concerns regarding your health   Additional Information:  Your vital signs today were: BP 121/43   Pulse 58   Temp(Src) 97.9 F (36.6 C) (Oral)   Resp 15   Ht 6' (1.829 m)   Wt 270 lb (122.471 kg)   BMI 36.61 kg/m2   SpO2 94% If your blood pressure (BP) was elevated above 135/85 this visit, please have this repeated by your doctor within one month. --------------

## 2013-12-21 NOTE — ED Provider Notes (Signed)
CSN: [id number]     Arrival date & time 12/21/13  0321 History   First MD Initiated Contact with Patient 12/21/13 [phone]     Chief Complaint  Patient presents with  . 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 Associated with nausea, increased urinary frequency. He is unsure of hematuria. Baseline numbness to touch and pain in R leg. Pain does not shoot into leg. No fever, CP, SOB. No BM in 2 days. No constipation or diarrhea otherwise. Seen at urgent care 5/27 tx flexeril and tramdol for lumbago. No red flag s/s of low back pain. The onset of this condition was acute. The course is constant. Aggravating factors: none. Alleviating factors: none.    Patient is a 63 y.o. male presenting with abdominal pain. The history is provided by the patient and medical records.  Abdominal Pain Associated symptoms: nausea   Associated symptoms: no chest pain, no constipation, no cough, no diarrhea, no dysuria, no fever, no hematuria, no sore throat and no vomiting     Past Medical History  Diagnosis Date  . Obesity   . Sleep apnea   . Chest pain     Diagnostic cardiac cath October 2012. Normal coronary arteries, left ventricular function  . Right knee injury   . Diabetes mellitus   . Hypertension   . Smoker   . HLD (hyperlipidemia) 12/09/2012   Past Surgical History  Procedure Laterality Date  . Cardiac catheterization  Oct 2012    nml coronary arteries, aorta, LV fcn  . Back surgery      for ruptured disc lumbar area  . Neck surgery      for ruptured disc  . Appendectomy    . Tonsillectomy    . I&d extremity Right 02/06/2013    Procedure: IRRIGATION AND  DEBRIDEMENT INDEX FINGER;  Surgeon: Tennis Must, MD;  Location: [place] [facility];  Service: Orthopedics;  Laterality: Right;   No family history on file. History  Substance Use Topics  . Smoking status: Current Every Day Smoker  . Smokeless tobacco: Not on file  . Alcohol Use: No    Review of Systems  Constitutional: Negative for fever and unexpected weight change.  HENT: Negative for rhinorrhea and sore throat.   Eyes: Negative for redness.  Respiratory: Negative for cough.   Cardiovascular: Negative for chest pain.  Gastrointestinal: Positive for nausea and abdominal pain. Negative for vomiting, diarrhea and constipation.       Neg for fecal incontinence  Genitourinary: Positive for frequency. Negative for dysuria, hematuria, flank pain and difficulty urinating.       Negative for urinary incontinence or retention  Musculoskeletal: Positive for back pain. Negative for myalgias.  Skin: Negative for rash.  Neurological: Positive for numbness (chronic R leg). Negative for weakness and headaches.       Negative for saddle paresthesias       Allergies  Lipitor; Sulfa antibiotics; and Zocor  Home Medications   Prior to Admission medications   Medication Sig Start Date End Date Taking? Authorizing Provider  Ascorbic Acid (VITAMIN C PO) Take 1 tablet by mouth daily. Air Born Vitamin C    Historical Provider, MD  cyclobenzaprine (FLEXERIL) 10 MG tablet Take 1  tablet (10 mg total) by mouth at bedtime as needed for muscle spasms. 12/17/13   Gregor Hams, MD  hydrOXYzine (ATARAX/VISTARIL) 25 MG tablet Take 1 tablet (25 mg total) by mouth 3 (three) times daily. As needed for itching 05/14/13   Hannah Muthersbaugh, PA-C  ibuprofen (ADVIL,MOTRIN) 200 MG tablet Take 400-600 mg by mouth every 6 (six) hours as needed for pain.    Historical Provider, MD  Multiple Vitamins-Minerals (MULTIVITAMIN WITH MINERALS) tablet Take 1 tablet by mouth daily.    Historical Provider, MD  traMADol (ULTRAM) 50 MG  tablet Take 1 tablet (50 mg total) by mouth every 6 (six) hours as needed. 12/17/13   Gregor Hams, MD  triamcinolone cream (KENALOG) 0.1 % Apply 1 application topically 4 (four) times daily as needed (for rash).    Historical Provider, MD   BP 150/65  Pulse 57  Temp(Src) 97.9 F (36.6 C) (Oral)  Resp 18  Ht 6' (1.829 m)  Wt 270 lb (122.471 kg)  BMI 36.61 kg/m2  SpO2 97% Physical Exam  Nursing note and vitals reviewed. Constitutional: He appears well-developed and well-nourished.  HENT:  Head: Normocephalic and atraumatic.  Eyes: Conjunctivae are normal. Right eye exhibits no discharge. Left eye exhibits no discharge.  Neck: Normal range of motion. Neck supple.  Cardiovascular: Normal rate, regular rhythm and normal heart sounds.   Pulmonary/Chest: Effort normal and breath sounds normal.  Abdominal: Soft. There is tenderness (mild) in the right lower quadrant and suprapubic area. There is no rigidity, no rebound, no guarding, no CVA tenderness, no tenderness at McBurney's point and negative Murphy's sign.  Exam limited by pannus  Musculoskeletal: Normal range of motion. He exhibits no tenderness.       Cervical back: He exhibits normal range of motion, no tenderness and no bony tenderness.       Thoracic back: He exhibits normal range of motion, no tenderness and no bony tenderness.       Lumbar back: He exhibits normal range of motion, no tenderness and no bony tenderness.       Back:  No step-off noted with palpation of spine.   Neurological: He is alert. He has normal reflexes. No sensory deficit. He exhibits normal muscle tone.  5/5 strength in entire lower extremities bilaterally. No sensation deficit.   Skin: Skin is warm and dry.  Psychiatric: He has a normal mood and affect.    ED Course  Procedures (including critical care time) Labs Review Labs Reviewed  CBC WITH DIFFERENTIAL - Abnormal; Notable for the following:    WBC 10.8 (*)    Platelets 120 (*)    Eosinophils  Relative 6 (*)    All other components within normal limits  COMPREHENSIVE METABOLIC PANEL - Abnormal; Notable for the following:    Glucose, Bld 155 (*)    All other components within normal limits  URINALYSIS, ROUTINE W REFLEX MICROSCOPIC - Abnormal; Notable for the following:    Protein, ur 100 (*)    All other components within normal limits  URINE MICROSCOPIC-ADD ON    Imaging Review Ct Abdomen Pelvis Wo Contrast  12/21/2013   CLINICAL DATA:  Back pain which radiates towards stomach and down right leg, increases with movement  EXAM: CT ABDOMEN AND PELVIS WITHOUT CONTRAST  TECHNIQUE: Multidetector CT imaging of the abdomen and pelvis was performed following the standard protocol without IV contrast.  COMPARISON:  CT abdomen pelvis-01/22/2012  FINDINGS: The lack of intravenous contrast limits the ability to evaluate  solid abdominal organs.  Normal noncontrast appearance of the bilateral kidneys. No renal stones. No stones are seen along the expected course of either ureter or the urinary bladder. Normal noncontrast appearance of the urinary bladder given degree distention. No urinary obstruction or perinephric stranding.  There is nodularity of the hepatic contour (representative images 35 and 38, series 201). This finding is associated with splenomegaly with the spleen measuring approximately 16 cm in length (image 29, series 201). No ascites. No definitive hepatic lesions on this noncontrast examination.  There are multiple layering hyper attenuating gallstones within the neck of an otherwise normal-appearing gallbladder (image 31, series 21) without associated gallbladder wall thickening or pericholecystic fluid.  Note is made of an approximately 2.9 x 2.1 cm fat containing - 103 Hounsfield unit) adrenal myelolipoma within the medial limb of the right adrenal gland (image 24). Normal noncontrast appearance of the left adrenal gland. Normal noncontrast appearance of the pancreas.  Moderate colonic  stool burden without evidence of obstruction. The appendix is not visualized, compatible with provided surgical history. No pneumoperitoneum, pneumatosis or portal venous gas.  Scattered atherosclerotic plaque within a normal caliber abdominal aorta.  Shotty porta hepatis and retroperitoneal lymph nodes are individually not enlarged by size criteria with index porta hepatis lymph node measuring approximately 1.2 cm in greatest short axis diameter (image 29, series 201) an index aortocaval lymph node measuring approximately 0.9 cm (image 44). Note is also made of a enlarged lymph node just caudal to the anterior aspect of the right hemidiaphragm measuring approximately 0.6 cm in greatest short axis diameter (image 9, series 201 as well as a shotty pericardiac lymph node measuring approximately 0.9 cm (image 7), both of which are presumably reactive given the suspected cirrhotic change within the liver. No definite bulky retroperitoneal, mesenteric, pelvic or inguinal lymphadenopathy on this noncontrast examination.  Normal noncontrast appearance of the pelvic organs. No free fluid in the pelvis.  Limited visualization of lower thorax demonstrates minimal subpleural ground-glass atelectasis within the imaged left lower lobe. There is an approximately 1.2 x 1.1 cm tiny pneumatocele within image right lower lobe (image 7, series 202). No discrete focal airspace opacities. No pleural effusion.  Borderline cardiomegaly. Calcifications within the aortic valve leaflets are suspected though incompletely imaged. No pericardial effusion.  No acute or aggressive osseus abnormalities. Mild to moderate multilevel lumbar spine DDD of L5-S1 with disc space height loss, endplate irregularity and sclerosis. Regional soft tissues appear normal.  IMPRESSION: 1. No evidence of nephrolithiasis or urinary obstruction. 2. 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 3. Cholelithiasis without evidence of cholecystitis. 4.  Borderline cardiomegaly with suspected calcifications within the aortic valve leaflets. Further evaluation of cardiac echo could be performed as clinically indicated. 5. Incidentally noted approximately 2.9 cm benign adrenal myelolipoma within the medial limb of the right adrenal gland. 6. Nodularity of the hepatic contour worrisome for cirrhosis with associated splenomegaly suggestive of associated portal venous hypertension. No evidence of ascites. Shoddy porta hepatis, infra diaphragmatic and pericardial lymph nodes presumably reactive due to cirrhotic change of the liver. While no discrete hepatic lesions are identified on this noncontrast examination, further evaluation with nonemergent contrast enhanced MRI could be performed as clinically indicated.   Electronically Signed   By: Sandi Mariscal M.D.   On: 12/21/2013 09:10     EKG Interpretation None      7:43 AM Patient seen and examined. Work-up initiated. Medications ordered.   Vital signs reviewed and are as follows:  Filed Vitals:   12/21/13 0742  BP: 150/65  Pulse: 57  Temp:   Resp: 18   9:28 AM Patient's pain is improved. Informed of lab and CT results. Discussed he should f/u with PCP regarding CT findings concerning for cirrhosis. He has no history of liver problems.   Patient was discussed with Hoy Morn, MD.  Will ambulate.  9:35 AM Patient sat on side of bed and had worsening of pain, was unable to ambulate. Additional dose of medication ordered. Will attempt again when pain controlled.   10:57 AM Patient much better after 2nd dose pain medication. Per nurse, ambulated without assistance. Patient ready for discharge.   Patient was counseled on back pain precautions and told to do activity as tolerated but do not lift, push, or pull heavy objects more than 10 pounds for the next week.  Patient counseled to use ice or heat on back for no longer than 15 minutes every hour.   Patient prescribed muscle relaxer and counseled  on proper use of muscle relaxant medication. Counseled not to take with previously prescribed flexeril.   Patient prescribed narcotic pain medicine and counseled on proper use of narcotic pain medications. Counseled not to combine this medication with others containing tylenol. Counseled not to take with previously prescribed tramadol.   Urged patient not to drink alcohol, drive, or perform any other activities that requires focus while taking either of these medications.  Patient urged to follow-up with PCP if pain does not improve with treatment and rest or if pain becomes recurrent. Urged to return with worsening severe pain, loss of bowel or bladder control, trouble walking.   The patient verbalizes understanding and agrees with the plan.  MDM   Final diagnoses:  Back pain  Abdominal pain   Back pain -- history of DDD in lower back and this pain is likely related to this. CT ordered due to current flank and abdominal pain component to rule out ureteral colic. No ureteral stones seen. No other definite cause for pain noted. Patient with incidental finding of cirrhotic appearance liver with splenomegaly. Patient informed of this and told to followup with his primary care physician to be evaluated for these findings. Labs are otherwise reassuring. Blood sugar on a mildly elevated. No concern for ketosis.  Otherwise no neurological deficits. Patient is ambulatory. No warning symptoms of back pain including: loss of bowel or bladder control, night sweats, waking from sleep with back pain, unexplained fevers or weight loss, h/o cancer, IVDU, recent trauma. No concern for cauda equina, epidural abscess, or other serious cause of back pain. Conservative measures such as rest, ice/heat and pain medicine indicated with PCP follow-up if no improvement with conservative management.      Carlisle Cater, PA-C 12/21/13 1101

## 2013-12-21 NOTE — ED Notes (Signed)
Pt provided water, is using phone to call his wife.

## 2013-12-21 NOTE — ED Notes (Signed)
Pt O2 sat drops to mid 80s while sleeping. Once pt is woken up O2 sat increase to 92-97.

## 2013-12-21 NOTE — ED Notes (Addendum)
Pt. c/o of back pain that radiates toward stomach and down R leg that increases with movement. Pain 8/10. Pt sts he has been having trouble urinating for past 2 days. Pt has had nausea but denies vomiting. Pt was seen at urgent care Thursday and was sent home with muscle relaxant that he has been using but pt. Does not remember the name of the medication.

## 2013-12-21 NOTE — ED Provider Notes (Signed)
Medical screening examination/treatment/procedure(s) were performed by non-physician practitioner and as supervising physician I was immediately available for consultation/collaboration.   EKG Interpretation None        Hoy Morn, MD 12/21/13 [phone]

## 2013-12-21 NOTE — ED Notes (Signed)
Patient transported to CT 

## 2013-12-26 ENCOUNTER — Ambulatory Visit (INDEPENDENT_AMBULATORY_CARE_PROVIDER_SITE_OTHER): Payer: No Typology Code available for payment source | Admitting: Family Medicine

## 2013-12-26 ENCOUNTER — Encounter: Payer: Self-pay | Admitting: Family Medicine

## 2013-12-26 VITALS — BP 110/64 | HR 80 | Temp 97.4°F | Resp 18 | Ht 72.0 in | Wt 268.0 lb

## 2013-12-26 DIAGNOSIS — K7460 Unspecified cirrhosis of liver: Secondary | ICD-10-CM

## 2013-12-26 DIAGNOSIS — M5431 Sciatica, right side: Secondary | ICD-10-CM

## 2013-12-26 DIAGNOSIS — IMO0001 Reserved for inherently not codable concepts without codable children: Secondary | ICD-10-CM

## 2013-12-26 DIAGNOSIS — M5430 Sciatica, unspecified side: Secondary | ICD-10-CM

## 2013-12-26 DIAGNOSIS — Z09 Encounter for follow-up examination after completed treatment for conditions other than malignant neoplasm: Secondary | ICD-10-CM

## 2013-12-26 DIAGNOSIS — E1165 Type 2 diabetes mellitus with hyperglycemia: Secondary | ICD-10-CM

## 2013-12-26 MED ORDER — OXYCODONE-ACETAMINOPHEN 10-325 MG PO TABS: 1.0000 | ORAL_TABLET | ORAL | Status: DC | PRN

## 2013-12-26 MED ORDER — PREDNISONE 20 MG PO TABS: ORAL_TABLET | ORAL | Status: DC

## 2013-12-26 NOTE — Progress Notes (Signed)
Subjective:    Patient ID: Ricky Rios, male    DOB: 06-30-1951, 63 y.o.   MRN: [id number]  HPI Patient the hospital twice due to severe low back pain. It starts in his low back and radiates into his right gluteus and down his right leg. The pain stops at the level of his knee. He states that his right leg feels weak. His right thigh feels numb. He has a positive right straight leg raise. He has moderate degenerative disease at L5-S1 as seen on a CT scan of his abdomen and pelvis. He is taking muscle relaxers and narcotic pain medication without any benefit. He is here today for reevaluation. Of note he had a questionable finding of cirrhosis of the liver with splenomegaly and portal venous hypertension on the CT scan. The patient has never consumed excessive alcohol. He rarely takes Tylenol. He does have a past medical history of diabetes. He has no family history of iron problems or hemochromatosis. He has no knowledge of exposure to hepatitis. Past Medical History  Diagnosis Date  . Obesity   . Sleep apnea   . Chest pain     Diagnostic cardiac cath October 2012. Normal coronary arteries, left ventricular function  . Right knee injury   . Diabetes mellitus   . Hypertension   . Smoker   . HLD (hyperlipidemia) 12/09/2012  . History of back surgery   . History of neck surgery    Past Surgical History  Procedure Laterality Date  . Cardiac catheterization  Oct 2012    nml coronary arteries, aorta, LV fcn  . Back surgery      for ruptured disc lumbar area  . Neck surgery      for ruptured disc  . Appendectomy    . Tonsillectomy    . I&d extremity Right 02/06/2013    Procedure: IRRIGATION AND DEBRIDEMENT INDEX FINGER;  Surgeon: Tennis Must, MD;  Location: [facility];  Service: Orthopedics;  Laterality: Right;   Current Outpatient Prescriptions on File Prior to Visit  Medication Sig Dispense Refill  . ibuprofen (ADVIL,MOTRIN) 200 MG tablet Take 400 mg by mouth every 6 (six) hours as  needed for mild pain.      . methocarbamol (ROBAXIN) 500 MG tablet Take 2 tablets (1,000 mg total) by mouth 4 (four) times daily.  20 tablet  0  . Multiple Vitamins-Minerals (MULTIVITAMIN WITH MINERALS) tablet Take 1 tablet by mouth daily.      . naproxen (NAPROSYN) 500 MG tablet Take 1 tablet (500 mg total) by mouth 2 (two) times daily.  20 tablet  0  . oxyCODONE-acetaminophen (PERCOCET/ROXICET) 5-325 MG per tablet Take 1-2 tablets by mouth every 6 (six) hours as needed for severe pain.  12 tablet  0  . triamcinolone cream (KENALOG) 0.1 % Apply 1 application topically 4 (four) times daily as needed (for rash).       No current facility-administered medications on file prior to visit.   Allergies  Allergen Reactions  . Lipitor [Atorvastatin Calcium] Other (See Comments)    shaking  . Sulfa Antibiotics Other (See Comments)    unknown  . Zocor [Simvastatin - High Dose] Other (See Comments)    shaking   History   Social History  . Marital Status: Married    Spouse Name: N/A    Number of Children: N/A  . Years of Education: N/A   Occupational History  . Not on file.   Social History Main Topics  .  Smoking status: Current Every Day Smoker -- 2.00 packs/day    Types: Cigarettes  . Smokeless tobacco: Not on file  . Alcohol Use: No  . Drug Use: No  . Sexual Activity: Not on file   Other Topics Concern  . Not on file   Social History Narrative  . No narrative on file      Review of Systems  All other systems reviewed and are negative.      Objective:   Physical Exam  Vitals reviewed. Cardiovascular: Normal rate, regular rhythm and normal heart sounds.   No murmur heard. Pulmonary/Chest: Effort normal and breath sounds normal. No respiratory distress. He has no wheezes. He has no rales.  Abdominal: Soft. Bowel sounds are normal. He exhibits distension. There is no tenderness.  Musculoskeletal:       Lumbar back: He exhibits decreased range of motion, tenderness, pain  and spasm. He exhibits no bony tenderness.  Neurological: He has normal reflexes. He displays normal reflexes. He exhibits normal muscle tone.   patient has a positive right straight leg raise.        Assessment & Plan:  1. Hospital discharge follow-up  2. Cirrhosis of liver without mention of alcohol Patient out for viral hepatitis as well as hemochromatosis. Discuss lab work and followup in one week. - COMPLETE METABOLIC PANEL WITH GFR - Hepatitis panel, acute - IBC Panel - Ferritin  3. Type II or unspecified type diabetes mellitus without mention of complication, uncontrolled No follow up in years.  Check HgA1c. - Hemoglobin A1c  4. Right sided sciatica I suspect HNP.  Begin prednisone taper pack and recheck in 1 week.  MRI of the lumbar spine if no better. - predniSONE (DELTASONE) 20 MG tablet; 3 tabs poqday 1-2, 2 tabs poqday 3-4, 1 tab poqday 5-6  Dispense: 12 tablet; Refill: 0 - oxyCODONE-acetaminophen (PERCOCET) 10-325 MG per tablet; Take 1 tablet by mouth every 4 (four) hours as needed for pain.  Dispense: 60 tablet; Refill: 0

## 2013-12-27 LAB — COMPLETE METABOLIC PANEL WITH GFR
ALK PHOS: 73 U/L (ref 39–117)
ALT: 17 U/L (ref 0–53)
AST: 20 U/L (ref 0–37)
Albumin: 3.8 g/dL (ref 3.5–5.2)
BUN: 16 mg/dL (ref 6–23)
CALCIUM: 9.2 mg/dL (ref 8.4–10.5)
CO2: 24 mEq/L (ref 19–32)
Chloride: 103 mEq/L (ref 96–112)
Creat: 0.79 mg/dL (ref 0.50–1.35)
GFR, Est African American: >89 mL/min
GFR, Est Non African American: >89 mL/min
Glucose, Bld: 142 mg/dL — ABNORMAL HIGH (ref 70–99)
Potassium: 4.1 mEq/L (ref 3.5–5.3)
SODIUM: 140 meq/L (ref 135–145)
TOTAL PROTEIN: 6.9 g/dL (ref 6.0–8.3)
Total Bilirubin: 0.3 mg/dL (ref 0.2–1.2)

## 2013-12-27 LAB — HEPATITIS PANEL, ACUTE
HCV Ab: NEGATIVE
HEP A IGM: NONREACTIVE
HEP B S AG: NEGATIVE
Hep B C IgM: NONREACTIVE

## 2013-12-27 LAB — IRON: Iron: 59 ug/dL (ref 42–165)

## 2013-12-27 LAB — HEMOGLOBIN A1C
Hgb A1c MFr Bld: 6.1 % — ABNORMAL HIGH (ref <5.7)
Mean Plasma Glucose: 128 mg/dL — ABNORMAL HIGH (ref <117)

## 2013-12-27 LAB — IBC PANEL
%SAT: 22 % (ref 20–55)
TIBC: 268 ug/dL (ref 215–435)
UIBC: 209 ug/dL (ref 125–400)

## 2013-12-27 LAB — FERRITIN: FERRITIN: 191 ng/mL (ref 22–322)

## 2014-01-01 ENCOUNTER — Other Ambulatory Visit: Payer: Self-pay | Admitting: Family Medicine

## 2014-01-01 DIAGNOSIS — K7460 Unspecified cirrhosis of liver: Secondary | ICD-10-CM

## 2014-01-02 ENCOUNTER — Ambulatory Visit (INDEPENDENT_AMBULATORY_CARE_PROVIDER_SITE_OTHER): Payer: No Typology Code available for payment source | Admitting: Family Medicine

## 2014-01-02 ENCOUNTER — Encounter: Payer: Self-pay | Admitting: Family Medicine

## 2014-01-02 VITALS — BP 100/64 | HR 78 | Temp 98.0°F | Resp 16 | Ht 72.0 in | Wt 267.0 lb

## 2014-01-02 DIAGNOSIS — M79604 Pain in right leg: Secondary | ICD-10-CM

## 2014-01-02 DIAGNOSIS — K7460 Unspecified cirrhosis of liver: Secondary | ICD-10-CM

## 2014-01-02 DIAGNOSIS — M79609 Pain in unspecified limb: Secondary | ICD-10-CM

## 2014-01-02 NOTE — Progress Notes (Signed)
Subjective:    Patient ID: Ricky Rios, male    DOB: 03/07/1951, 63 y.o.   MRN: [id number]  HPI 12/26/13 Patient the hospital twice due to severe low back pain. It starts in his low back and radiates into his right gluteus and down his right leg. The pain stops at the level of his knee. He states that his right leg feels weak. His right thigh feels numb. He has a positive right straight leg raise. He has moderate degenerative disease at L5-S1 as seen on a CT scan of his abdomen and pelvis. He is taking muscle relaxers and narcotic pain medication without any benefit. He is here today for reevaluation. Of note he had a questionable finding of cirrhosis of the liver with splenomegaly and portal venous hypertension on the CT scan. The patient has never consumed excessive alcohol. He rarely takes Tylenol. He does have a past medical history of diabetes. He has no family history of iron problems or hemochromatosis. He has no knowledge of exposure to hepatitis.  At that time, my plan was: 1. Hospital discharge follow-up  2. Cirrhosis of liver without mention of alcohol Patient out for viral hepatitis as well as hemochromatosis. Discuss lab work and followup in one week. - COMPLETE METABOLIC PANEL WITH GFR - Hepatitis panel, acute - IBC Panel - Ferritin  3. Type II or unspecified type diabetes mellitus without mention of complication, uncontrolled No follow up in years.  Check HgA1c. - Hemoglobin A1c  4. Right sided sciatica I suspect HNP.  Begin prednisone taper pack and recheck in 1 week.  MRI of the lumbar spine if no better. - predniSONE (DELTASONE) 20 MG tablet; 3 tabs poqday 1-2, 2 tabs poqday 3-4, 1 tab poqday 5-6  Dispense: 12 tablet; Refill: 0 - oxyCODONE-acetaminophen (PERCOCET) 10-325 MG per tablet; Take 1 tablet by mouth every 4 (four) hours as needed for pain.  Dispense: 60 tablet; Refill: 0   01/02/14 She continues to have pain in his right leg. However the pain now is  centered on the proximal part of his quadriceps. It is also located in his anterior hip. It is worse with standing. It is worse with resisted hip flexion. Patient also has tenderness to palpation over the anterior right hip. He still has numbness over his lateral right thigh although he states that this is chronic. His symptoms are more consistent with a quadriceps strain. He is interested in physical therapy. Fortunately his bowel hepatitis panel returned normal. There is also no evidence of hemochromatosis. Past Medical History  Diagnosis Date  . Obesity   . Sleep apnea   . Chest pain     Diagnostic cardiac cath October 2012. Normal coronary arteries, left ventricular function  . Right knee injury   . Diabetes mellitus   . Hypertension   . Smoker   . HLD (hyperlipidemia) 12/09/2012  . History of back surgery   . History of neck surgery    Past Surgical History  Procedure Laterality Date  . Cardiac catheterization  Oct 2012    nml coronary arteries, aorta, LV fcn  . Back surgery      for ruptured disc lumbar area  . Neck surgery      for ruptured disc  . Appendectomy    . Tonsillectomy    . I&d extremity Right 02/06/2013    Procedure: IRRIGATION AND DEBRIDEMENT INDEX FINGER;  Surgeon: Tennis Must, MD;  Location: [facility];  Service: Orthopedics;  Laterality: Right;  Current Outpatient Prescriptions on File Prior to Visit  Medication Sig Dispense Refill  . ibuprofen (ADVIL,MOTRIN) 200 MG tablet Take 400 mg by mouth every 6 (six) hours as needed for mild pain.      . methocarbamol (ROBAXIN) 500 MG tablet Take 2 tablets (1,000 mg total) by mouth 4 (four) times daily.  20 tablet  0  . Multiple Vitamins-Minerals (MULTIVITAMIN WITH MINERALS) tablet Take 1 tablet by mouth daily.      . naproxen (NAPROSYN) 500 MG tablet Take 1 tablet (500 mg total) by mouth 2 (two) times daily.  20 tablet  0  . oxyCODONE-acetaminophen (PERCOCET) 10-325 MG per tablet Take 1 tablet by mouth every 4 (four) hours  as needed for pain.  60 tablet  0  . oxyCODONE-acetaminophen (PERCOCET/ROXICET) 5-325 MG per tablet Take 1-2 tablets by mouth every 6 (six) hours as needed for severe pain.  12 tablet  0  . triamcinolone cream (KENALOG) 0.1 % Apply 1 application topically 4 (four) times daily as needed (for rash).       No current facility-administered medications on file prior to visit.   Allergies  Allergen Reactions  . Lipitor [Atorvastatin Calcium] Other (See Comments)    shaking  . Sulfa Antibiotics Other (See Comments)    unknown  . Zocor [Simvastatin - High Dose] Other (See Comments)    shaking   History   Social History  . Marital Status: Married    Spouse Name: N/A    Number of Children: N/A  . Years of Education: N/A   Occupational History  . Not on file.   Social History Main Topics  . Smoking status: Current Every Day Smoker -- 2.00 packs/day    Types: Cigarettes  . Smokeless tobacco: Not on file  . Alcohol Use: No  . Drug Use: No  . Sexual Activity: Not on file   Other Topics Concern  . Not on file   Social History Narrative  . No narrative on file      Review of Systems  All other systems reviewed and are negative.      Objective:   Physical Exam  Vitals reviewed. Cardiovascular: Normal rate, regular rhythm and normal heart sounds.   No murmur heard. Pulmonary/Chest: Effort normal and breath sounds normal. No respiratory distress. He has no wheezes. He has no rales.  Abdominal: Soft. Bowel sounds are normal. He exhibits distension. There is no tenderness.  Musculoskeletal:       Right hip: He exhibits decreased range of motion, decreased strength and tenderness. He exhibits no bony tenderness.       Lumbar back: He exhibits decreased range of motion, tenderness, pain and spasm. He exhibits no bony tenderness.  Neurological: He has normal reflexes. He exhibits normal muscle tone.         Assessment & Plan:  1. 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 I recommended diet exercise and weight loss. - Ambulatory referral to Gastroenterology  2. Right leg pain I believe my initial diagnosis was incorrect. I believe the patient has a right quadriceps strain. Do not feel that this is sciatica at the present time. Therefore I do not think he would benefit from an MI. Brother underwent consult physical therapy. If symptoms are not improving at that point, I will consult orthopedics.  I have recommended the patient refrain from working for the next 2 weeks. - Ambulatory referral to Physical Therapy

## 2014-01-06 ENCOUNTER — Encounter: Payer: Self-pay | Admitting: Internal Medicine

## 2014-01-12 ENCOUNTER — Ambulatory Visit: Payer: No Typology Code available for payment source | Admitting: Physical Therapy

## 2014-01-12 ENCOUNTER — Telehealth: Payer: Self-pay | Admitting: Family Medicine

## 2014-01-12 DIAGNOSIS — M549 Dorsalgia, unspecified: Secondary | ICD-10-CM

## 2014-01-12 NOTE — Telephone Encounter (Signed)
Wife called and states that pt is in so much pain that he can not get out bed to go to PT and would like to know if we could order a MRI or if he needs to come in to be seen???

## 2014-01-12 NOTE — Telephone Encounter (Signed)
MRI ordered and pt's wife aware per vm

## 2014-01-12 NOTE — Telephone Encounter (Signed)
Please order MRI lumbar spine asap

## 2014-01-12 NOTE — Telephone Encounter (Signed)
Message copied by Alyson Locket on Mon Jan 12, 2014  2:27 PM ------      Message from: Devoria Glassing      Created: Mon Jan 12, 2014  9:27 AM       [phone]      Ricky Rios wife is calling about the pain her husband is in       And he cannot do his therapy  ------

## 2014-01-15 ENCOUNTER — Telehealth: Payer: Self-pay | Admitting: Family Medicine

## 2014-01-15 DIAGNOSIS — M5431 Sciatica, right side: Secondary | ICD-10-CM

## 2014-01-15 NOTE — Telephone Encounter (Signed)
?   OK to Refill  

## 2014-01-15 NOTE — Telephone Encounter (Signed)
[phone]  Pt is needing a refill on oxyCODONE-acetaminophen (PERCOCET) 10-325 MG per tablet

## 2014-01-16 ENCOUNTER — Ambulatory Visit
Admission: RE | Admit: 2014-01-16 | Discharge: 2014-01-16 | Disposition: A | Discharge status: Home / Self Care | Payer: No Typology Code available for payment source | Source: Ambulatory Visit | Attending: Family Medicine | Admitting: Family Medicine

## 2014-01-16 DIAGNOSIS — M549 Dorsalgia, unspecified: Secondary | ICD-10-CM

## 2014-01-16 MED ORDER — OXYCODONE-ACETAMINOPHEN 10-325 MG PO TABS: 1.0000 | ORAL_TABLET | ORAL | Status: DC | PRN

## 2014-01-16 NOTE — Telephone Encounter (Signed)
RX printed, left up front and patient aware to pick up  

## 2014-01-16 NOTE — Telephone Encounter (Signed)
Ok to refill 

## 2014-01-19 ENCOUNTER — Other Ambulatory Visit: Payer: Self-pay | Admitting: *Deleted

## 2014-01-19 DIAGNOSIS — M5126 Other intervertebral disc displacement, lumbar region: Secondary | ICD-10-CM

## 2014-01-22 ENCOUNTER — Telehealth: Payer: Self-pay | Admitting: *Deleted

## 2014-01-22 NOTE — Telephone Encounter (Signed)
Pt's wife Ralph Leyden called me in regards to his appointment with Antionette Char, we had made appointment for pt to go and have injections done for next week instead of sometime in August and then [facility] called pt to cancel the July 7 appointment, and she wanted to know what she needs to do now. I told Rachael that I would call [facility] and see what happened. I called and spoke to [facility] and she told me the reason why cancelled was because Dr. Nelva Bush wanted to just do injection and so they had to call pt's insurance to get authorization on steroid injection because they are out of network with ins co, she had told me that once authorization comes through authorized they will contact patient to schedule appointment but it may take 1-2 weeks before knowing anything. I called pt back and told her what was said and she is going to call her insurance [employer] to what she needs to do to expedite the order.

## 2014-01-31 ENCOUNTER — Encounter (HOSPITAL_COMMUNITY): Payer: Self-pay | Admitting: Emergency Medicine

## 2014-01-31 ENCOUNTER — Emergency Department (HOSPITAL_COMMUNITY)
Admission: EM | Admit: 2014-01-31 | Discharge: 2014-02-01 | Disposition: A | Discharge status: Home / Self Care | Payer: Medicaid Other | Attending: Emergency Medicine | Admitting: Emergency Medicine

## 2014-01-31 ENCOUNTER — Other Ambulatory Visit: Payer: Self-pay | Admitting: Family Medicine

## 2014-01-31 ENCOUNTER — Emergency Department (HOSPITAL_COMMUNITY): Payer: Medicaid Other

## 2014-01-31 DIAGNOSIS — M545 Low back pain, unspecified: Secondary | ICD-10-CM | POA: Insufficient documentation

## 2014-01-31 DIAGNOSIS — E785 Hyperlipidemia, unspecified: Secondary | ICD-10-CM

## 2014-01-31 DIAGNOSIS — F17200 Nicotine dependence, unspecified, uncomplicated: Secondary | ICD-10-CM | POA: Insufficient documentation

## 2014-01-31 DIAGNOSIS — E119 Type 2 diabetes mellitus without complications: Secondary | ICD-10-CM

## 2014-01-31 DIAGNOSIS — I10 Essential (primary) hypertension: Secondary | ICD-10-CM | POA: Insufficient documentation

## 2014-01-31 DIAGNOSIS — Z79899 Other long term (current) drug therapy: Secondary | ICD-10-CM

## 2014-01-31 DIAGNOSIS — Z8639 Personal history of other endocrine, nutritional and metabolic disease: Secondary | ICD-10-CM | POA: Insufficient documentation

## 2014-01-31 DIAGNOSIS — Z9889 Other specified postprocedural states: Secondary | ICD-10-CM | POA: Insufficient documentation

## 2014-01-31 DIAGNOSIS — E669 Obesity, unspecified: Secondary | ICD-10-CM | POA: Diagnosis not present

## 2014-01-31 DIAGNOSIS — Z862 Personal history of diseases of the blood and blood-forming organs and certain disorders involving the immune mechanism: Secondary | ICD-10-CM | POA: Insufficient documentation

## 2014-01-31 DIAGNOSIS — R1031 Right lower quadrant pain: Secondary | ICD-10-CM | POA: Diagnosis not present

## 2014-01-31 DIAGNOSIS — R109 Unspecified abdominal pain: Secondary | ICD-10-CM | POA: Diagnosis present

## 2014-01-31 LAB — CBC WITH DIFFERENTIAL/PLATELET
BASOS ABS: 0 10*3/uL (ref 0.0–0.1)
Basophils Relative: 0 % (ref 0–1)
EOS PCT: 4 % (ref 0–5)
Eosinophils Absolute: 0.4 10*3/uL (ref 0.0–0.7)
HCT: 40.8 % (ref 39.0–52.0)
Hemoglobin: 14.3 g/dL (ref 13.0–17.0)
Lymphocytes Relative: 30 % (ref 12–46)
Lymphs Abs: 2.6 10*3/uL (ref 0.7–4.0)
MCH: 30.4 pg (ref 26.0–34.0)
MCHC: 35 g/dL (ref 30.0–36.0)
MCV: 86.8 fL (ref 78.0–100.0)
Monocytes Absolute: 0.5 10*3/uL (ref 0.1–1.0)
Monocytes Relative: 6 % (ref 3–12)
Neutro Abs: 5.2 10*3/uL (ref 1.7–7.7)
Neutrophils Relative %: 60 % (ref 43–77)
Platelets: 118 10*3/uL — ABNORMAL LOW (ref 150–400)
RBC: 4.7 MIL/uL (ref 4.22–5.81)
RDW: 14.1 % (ref 11.5–15.5)
WBC: 8.7 10*3/uL (ref 4.0–10.5)

## 2014-01-31 LAB — URINALYSIS, ROUTINE W REFLEX MICROSCOPIC
Bilirubin Urine: NEGATIVE
Glucose, UA: NEGATIVE mg/dL
Hgb urine dipstick: NEGATIVE
Ketones, ur: NEGATIVE mg/dL
LEUKOCYTES UA: NEGATIVE
Nitrite: NEGATIVE
PROTEIN: 100 mg/dL — AB
Specific Gravity, Urine: 1.011 (ref 1.005–1.030)
Urobilinogen, UA: 0.2 mg/dL (ref 0.0–1.0)
pH: 8 (ref 5.0–8.0)

## 2014-01-31 LAB — COMPREHENSIVE METABOLIC PANEL
ALT: 20 U/L (ref 0–53)
AST: 23 U/L (ref 0–37)
Albumin: 3.5 g/dL (ref 3.5–5.2)
Alkaline Phosphatase: 71 U/L (ref 39–117)
Anion gap: 19 — ABNORMAL HIGH (ref 5–15)
BUN: 10 mg/dL (ref 6–23)
CALCIUM: 9.6 mg/dL (ref 8.4–10.5)
CO2: 22 mEq/L (ref 19–32)
CREATININE: 0.87 mg/dL (ref 0.50–1.35)
Chloride: 96 mEq/L (ref 96–112)
GLUCOSE: 156 mg/dL — AB (ref 70–99)
Potassium: 3.7 mEq/L (ref 3.7–5.3)
Sodium: 137 mEq/L (ref 137–147)
Total Bilirubin: 0.6 mg/dL (ref 0.3–1.2)
Total Protein: 7.2 g/dL (ref 6.0–8.3)

## 2014-01-31 LAB — URINE MICROSCOPIC-ADD ON

## 2014-01-31 MED ORDER — IOHEXOL 300 MG/ML  SOLN
25.0000 mL | Freq: Once | INTRAMUSCULAR | Status: AC | PRN
  Administered 2014-01-31: 25 mL via ORAL

## 2014-01-31 MED ORDER — HYDROMORPHONE HCL PF 1 MG/ML IJ SOLN
1.0000 mg | Freq: Once | INTRAMUSCULAR | Status: AC
  Administered 2014-01-31: 1 mg via INTRAVENOUS
  Filled 2014-01-31: qty 1

## 2014-01-31 NOTE — ED Notes (Signed)
Pt is here with lower abdomen and lower back.  Pt takes oxycodone for lower back pain.  Pt states passing gas but is constipated.  Pt states that it has been 2-3 weeks without bowel movement

## 2014-01-31 NOTE — ED Provider Notes (Signed)
CSN: [id number]     Arrival date & time 01/31/14  2121 History   None    Chief Complaint  Patient presents with  . Abdominal Pain  . Constipation     (Consider location/radiation/quality/duration/timing/severity/associated sxs/prior Treatment) HPI Patient complains of lower abdominal pain for approximately 2 weeks becoming worse over the past 2 days accompanied by back pain which is from "ruptured discs" chronic. Patient is constipated, though he does continue to pass gas per rectum. Had small bowel movement last time 3 days ago. Patient chronically treated with Percocet for back pain. He used a laxative today, without success. He also admits to vomiting one time today, clear mucus, denies nausea at present. Denies chest pain. Denies other associated symptoms. Nothing makes pain better or worse . ,Past Medical History  Diagnosis Date  . Obesity   . Sleep apnea   . Chest pain     Diagnostic cardiac cath October 2012. Normal coronary arteries, left ventricular function  . Right knee injury   . Diabetes mellitus   . Hypertension   . Smoker   . HLD (hyperlipidemia) 12/09/2012  . History of back surgery   . History of neck surgery    Past Surgical History  Procedure Laterality Date  . Cardiac catheterization  Oct 2012    nml coronary arteries, aorta, LV fcn  . Back surgery      for ruptured disc lumbar area  . Neck surgery      for ruptured disc  . Appendectomy    . Tonsillectomy    . I&d extremity Right 02/06/2013    Procedure: IRRIGATION AND DEBRIDEMENT INDEX FINGER;  Surgeon: Tennis Must, MD;  Location: [place];  Service: Orthopedics;  Laterality: Right;   No family history on file. History  Substance Use Topics  . Smoking status: Current Every Day Smoker -- 2.00 packs/day    Types: Cigarettes  . Smokeless tobacco: Not on file  . Alcohol Use: No    Review of Systems  Constitutional: Negative.   HENT: Negative.   Respiratory: Negative.   Cardiovascular: Negative.    Gastrointestinal: Positive for vomiting, abdominal pain and constipation.  Musculoskeletal: Positive for back pain.  Skin: Negative.   Neurological: Negative.   Psychiatric/Behavioral: Negative.   All other systems reviewed and are negative.     Allergies  Lipitor; Sulfa antibiotics; and Zocor  Home Medications   Prior to Admission medications   Medication Sig Start Date End Date Taking? Authorizing Provider  ibuprofen (ADVIL,MOTRIN) 200 MG tablet Take 400 mg by mouth every 6 (six) hours as needed for mild pain.    Historical Provider, MD  methocarbamol (ROBAXIN) 500 MG tablet Take 2 tablets (1,000 mg total) by mouth 4 (four) times daily. 12/21/13   Carlisle Cater, PA-C  Multiple Vitamins-Minerals (MULTIVITAMIN WITH MINERALS) tablet Take 1 tablet by mouth daily.    Historical Provider, MD  naproxen (NAPROSYN) 500 MG tablet Take 1 tablet (500 mg total) by mouth 2 (two) times daily. 12/21/13   Carlisle Cater, PA-C  oxyCODONE-acetaminophen (PERCOCET) 10-325 MG per tablet Take 1 tablet by mouth every 4 (four) hours as needed for pain. 01/16/14   Susy Frizzle, MD  oxyCODONE-acetaminophen (PERCOCET/ROXICET) 5-325 MG per tablet Take 1-2 tablets by mouth every 6 (six) hours as needed for severe pain. 12/21/13   Carlisle Cater, PA-C  triamcinolone cream (KENALOG) 0.1 % Apply 1 application topically 4 (four) times daily as needed (for rash).    Historical Provider, MD   BP  146/111  Pulse 67  Temp(Src) 97.9 F (36.6 C) (Oral)  Resp 22  SpO2 100% Physical Exam  Nursing note and vitals reviewed. Constitutional: He appears well-developed and well-nourished. He appears distressed.  Appears uncomfortable  HENT:  Head: Normocephalic and atraumatic.  Edentulous  Eyes: Conjunctivae are normal. Pupils are equal, round, and reactive to light.  Neck: Neck supple. No tracheal deviation present. No thyromegaly present.  Cardiovascular: Normal rate and regular rhythm.   No murmur  heard. Pulmonary/Chest: Effort normal and breath sounds normal.  Abdominal: Soft. Bowel sounds are normal. He exhibits no distension. There is tenderness.  Obese tender at right lower quadrant  Genitourinary: Rectum normal, prostate normal and penis normal. Guaiac negative stool.  Small amount borwn stool on exam finger  Musculoskeletal: Normal range of motion. He exhibits no edema and no tenderness.  Neurological: He is alert. Coordination normal.  Skin: Skin is warm and dry. No rash noted.  Psychiatric: He has a normal mood and affect.    ED Course  Procedures (including critical care time) Labs Review Labs Reviewed  CBC WITH DIFFERENTIAL  COMPREHENSIVE METABOLIC PANEL  URINALYSIS, ROUTINE W REFLEX MICROSCOPIC    Imaging Review No results found.   EKG Interpretation None     12:35 AM pain improved after treatment with intravenous opioids. Patient resting comfortably Results for orders placed during the hospital encounter of 01/31/14  CBC WITH DIFFERENTIAL      Result Value Ref Range   WBC 8.7  4.0 - 10.5 K/uL   RBC 4.70  4.22 - 5.81 MIL/uL   Hemoglobin 14.3  13.0 - 17.0 g/dL   HCT 40.8  39.0 - 52.0 %   MCV 86.8  78.0 - 100.0 fL   MCH 30.4  26.0 - 34.0 pg   MCHC 35.0  30.0 - 36.0 g/dL   RDW 14.1  11.5 - 15.5 %   Platelets 118 (*) 150 - 400 K/uL   Neutrophils Relative % 60  43 - 77 %   Neutro Abs 5.2  1.7 - 7.7 K/uL   Lymphocytes Relative 30  12 - 46 %   Lymphs Abs 2.6  0.7 - 4.0 K/uL   Monocytes Relative 6  3 - 12 %   Monocytes Absolute 0.5  0.1 - 1.0 K/uL   Eosinophils Relative 4  0 - 5 %   Eosinophils Absolute 0.4  0.0 - 0.7 K/uL   Basophils Relative 0  0 - 1 %   Basophils Absolute 0.0  0.0 - 0.1 K/uL  COMPREHENSIVE METABOLIC PANEL      Result Value Ref Range   Sodium 137  137 - 147 mEq/L   Potassium 3.7  3.7 - 5.3 mEq/L   Chloride 96  96 - 112 mEq/L   CO2 22  19 - 32 mEq/L   Glucose, Bld 156 (*) 70 - 99 mg/dL   BUN 10  6 - 23 mg/dL   Creatinine, Ser 0.87   0.50 - 1.35 mg/dL   Calcium 9.6  8.4 - 10.5 mg/dL   Total Protein 7.2  6.0 - 8.3 g/dL   Albumin 3.5  3.5 - 5.2 g/dL   AST 23  0 - 37 U/L   ALT 20  0 - 53 U/L   Alkaline Phosphatase 71  39 - 117 U/L   Total Bilirubin 0.6  0.3 - 1.2 mg/dL   GFR calc non Af Amer >90  >90 mL/min   GFR calc Af Amer >90  >90 mL/min  Anion gap 19 (*) 5 - 15  URINALYSIS, ROUTINE W REFLEX MICROSCOPIC      Result Value Ref Range   Color, Urine YELLOW  YELLOW   APPearance CLEAR  CLEAR   Specific Gravity, Urine 1.011  1.005 - 1.030   pH 8.0  5.0 - 8.0   Glucose, UA NEGATIVE  NEGATIVE mg/dL   Hgb urine dipstick NEGATIVE  NEGATIVE   Bilirubin Urine NEGATIVE  NEGATIVE   Ketones, ur NEGATIVE  NEGATIVE mg/dL   Protein, ur 100 (*) NEGATIVE mg/dL   Urobilinogen, UA 0.2  0.0 - 1.0 mg/dL   Nitrite NEGATIVE  NEGATIVE   Leukocytes, UA NEGATIVE  NEGATIVE  URINE MICROSCOPIC-ADD ON      Result Value Ref Range   Squamous Epithelial / LPF RARE  RARE   Bacteria, UA RARE  RARE   Mr Lumbar Spine Wo Contrast  01/16/2014   CLINICAL DATA:  Low back and right leg pain.  EXAM: MRI LUMBAR SPINE WITHOUT CONTRAST  TECHNIQUE: Multiplanar, multisequence MR imaging of the lumbar spine was performed. No intravenous contrast was administered.  COMPARISON:  CT scan 12/21/2013  FINDINGS: Normal overall alignment of the lumbar vertebral bodies. Moderate degenerative lumbar spondylosis with multilevel disc disease and facet disease. The vertebral bodies demonstrate normal marrow signal except for endplate reactive changes at L5-S1. The last full intervertebral disc space is labeled L5-S1 and the conus medullaris terminates at the top of L2. The facets are normally aligned. Moderate facet disease but no definite pars defects.  No significant paraspinal or retroperitoneal findings. Small scattered retroperitoneal lymph nodes are noted.  L1-2: Broad-based left foraminal disc protrusion with mass effect on the left L1 nerve root. No spinal or lateral  recess stenosis.  L2-3: Broad-based extra foraminal and far lateral disc protrusion on the right along with spurring changes displacing the extra foraminal right L2 nerve root. There is also a diffuse bulging annulus and moderate facet disease with mild bilateral lateral recess stenosis. No significant spinal stenosis.  L3-4: Broad-based bulging annulus, right paracentral disc extrusion, short pedicles and facet disease contribute to moderate spinal and bilateral lateral recess stenosis and moderate left foraminal stenosis.  L4-5: Broad-based disc protrusion with moderate mass effect on the ventral thecal sac. This in combination with moderate facet disease creates moderate spinal and bilateral lateral recess stenosis. There is also mild left foraminal encroachment.  L5-S1: Surgical changes noted with a right laminectomy defect. Central and left paracentral disc protrusion with mass effect on the left S1 nerve root. No significant foraminal stenosis. Advanced facet disease.  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 There is multilevel multifactorial spinal, lateral recess and foraminal stenosis as discussed above at the individual levels.   Electronically Signed   By: Kalman Jewels M.D.   On: 01/16/2014 14:22   Dg Abd Acute W/chest  01/31/2014   CLINICAL DATA:  Low abdominal and back pain  EXAM: ACUTE ABDOMEN SERIES (ABDOMEN 2 VIEW & CHEST 1 VIEW)  COMPARISON:  No recent similar comparison exam  FINDINGS: There is no evidence of dilated bowel loops or free intraperitoneal air. No radiopaque calculi or other significant radiographic abnormality is seen. Heart size and mediastinal contours are within normal limits. Both lungs are clear.  IMPRESSION: Negative abdominal radiographs.  No acute cardiopulmonary disease.   Electronically Signed   By: Conchita Paris M.D.   On: 01/31/2014 23:46   xrays viewed by me. MDM   patient signed out to Dr.  Wickline 12:40  AM Final diagnoses:  None  Dx #1 abdominal pain #2 chronic back pain #3 hyperglycemia      Orlie Dakin, MD 02/01/14 0045

## 2014-01-31 NOTE — ED Notes (Signed)
Patient transported to X-ray 

## 2014-02-01 ENCOUNTER — Emergency Department (HOSPITAL_COMMUNITY): Payer: Medicaid Other

## 2014-02-01 MED ORDER — HYDROMORPHONE HCL PF 1 MG/ML IJ SOLN
1.0000 mg | Freq: Once | INTRAMUSCULAR | Status: AC
  Administered 2014-02-01: 1 mg via INTRAVENOUS
  Filled 2014-02-01: qty 1

## 2014-02-01 MED ORDER — IOHEXOL 300 MG/ML  SOLN
100.0000 mL | Freq: Once | INTRAMUSCULAR | Status: AC | PRN
  Administered 2014-02-01: 100 mL via INTRAVENOUS

## 2014-02-01 MED ORDER — OXYCODONE-ACETAMINOPHEN 5-325 MG PO TABS
1.0000 | ORAL_TABLET | Freq: Once | ORAL | Status: AC
  Administered 2014-02-01: 1 via ORAL
  Filled 2014-02-01: qty 1

## 2014-02-01 NOTE — ED Notes (Signed)
Pt able to transfer self from wheel chair to car without assistance with use of cain. Pts wife reported this was usual for him but that he was in more pain.

## 2014-02-01 NOTE — ED Notes (Signed)
Ct notified that pt finished with contrast.

## 2014-02-01 NOTE — ED Notes (Signed)
Pt unable to ambulate due to pain

## 2014-02-01 NOTE — Discharge Instructions (Signed)
Abdominal (belly) pain can be caused by many things. any cases can be observed and treated at home after initial evaluation in the emergency department. Even though you are being discharged home, abdominal pain can be unpredictable. Therefore, you need a repeated exam if your pain does not resolve, returns, or worsens. Most patients with abdominal pain don't have to be admitted to the hospital or have surgery, but serious problems like appendicitis and gallbladder attacks can start out as nonspecific pain. Many abdominal conditions cannot be diagnosed in one visit, so follow-up evaluations are very important. SEEK IMMEDIATE MEDICAL ATTENTION IF: The pain does not go away or becomes severe, particularly over the next 8-12 hours.  A temperature above 100.22F develops.  Repeated vomiting occurs (multiple episodes).  The pain becomes localized to portions of the abdomen.In an adult, the left lower portion of the abdomen could be colitis or diverticulitis.  Blood is being passed in stools or vomit (bright red or black tarry stools).  Return also if you develop chest pain, difficulty breathing, dizziness or fainting, or become confused, poorly responsive, or inconsolable.  SEEK IMMEDIATE MEDICAL ATTENTION IF: New numbness, tingling, weakness, or problem with the use of your arms or legs.  Severe back pain not relieved with medications.  Change in bowel or bladder control (if you lose control of stool or urine, or if you are unable to urinate) Increasing pain in any areas of the body (such as chest or abdominal pain).  Shortness of breath, dizziness or fainting.  Nausea (feeling sick to your stomach), vomiting, fever, or sweats.

## 2014-02-01 NOTE — ED Provider Notes (Signed)
Ct ab/pelvis negative Pt reporting continued back pain - he has had increasing back pain since may, with difficulty walking No new weakness reported (he moves his legs freely in the bed) He has ortho spine f/u in one day Will attempt better pain control and d/c home Family is comfortable with this plan He is already on multiple meds at home for his constipation BP 158/82  Pulse 68  Temp(Src) 97.9 F (36.6 C) (Oral)  Resp 11  SpO2 96%   Sharyon Cable, MD 02/01/14 0149

## 2014-02-01 NOTE — ED Notes (Signed)
Patient transported to CT 

## 2014-02-02 ENCOUNTER — Telehealth: Payer: Self-pay | Admitting: *Deleted

## 2014-02-02 DIAGNOSIS — M5431 Sciatica, right side: Secondary | ICD-10-CM

## 2014-02-02 LAB — POC OCCULT BLOOD, ED: Fecal Occult Bld: NEGATIVE

## 2014-02-02 MED ORDER — OXYCODONE-ACETAMINOPHEN 10-325 MG PO TABS: 1.0000 | ORAL_TABLET | ORAL | Status: DC | PRN

## 2014-02-02 NOTE — Telephone Encounter (Signed)
Prescription printed and patient made aware to come to office to pick up.  

## 2014-02-02 NOTE — Telephone Encounter (Signed)
Pt called wanting to know if pt can get a refill on oxyCODONE-acetaminophen (PERCOCET) 10-325 MG per tablet, also pt wants to know if they get enough to take 2 pills instead of one, one isnt touching the pain.

## 2014-02-02 NOTE — Telephone Encounter (Signed)
ok 

## 2014-02-06 ENCOUNTER — Telehealth: Payer: Self-pay | Admitting: Family Medicine

## 2014-02-06 NOTE — Telephone Encounter (Signed)
Message copied by Alyson Locket on Fri Feb 06, 2014 10:25 AM ------      Message from: Devoria Glassing      Created: Thu Feb 05, 2014  3:15 PM       Joie Bimler, pa with your [employer] is calling to speak with you about the mutual patient, about some suicidal talk       [phone] ------

## 2014-02-06 NOTE — Telephone Encounter (Signed)
02/05/14 - Spoke to [place] yesterday about pt and she stated that he has been talking about commiting suicide b/c of his back pain and not being able to move about with his ADL. She states that in one sentence he states that he would like to die and then follows up with I really would not do that, I'm just in pain a lot and feel depressed. I ask her if this was an emergency situation and she believed not therefore will call in the AM.  Called and spoke to wife about the situation and she states that the pt has made comments about it but he is not serious. She feels he is very depressed and will not get out of bed due to pain which increases his depression. I offered to make him an appt to come in and speak to Dr. Dennard Schaumann to see if we need to get him set up for some psych help and she states that he will not get out of bed and when he does it is very hard for him to ambulate down their front steps. She said that he has another appt with Dr. Nelva Bush for another injection next week and if he gets worse she will call and set up appt. She assured me that all the weapons in the house were out of his range and he could not get to them.

## 2014-02-19 ENCOUNTER — Telehealth: Payer: Self-pay | Admitting: *Deleted

## 2014-02-19 DIAGNOSIS — M5431 Sciatica, right side: Secondary | ICD-10-CM

## 2014-02-19 MED ORDER — OXYCODONE-ACETAMINOPHEN 10-325 MG PO TABS: 1.0000 | ORAL_TABLET | ORAL | Status: DC | PRN

## 2014-02-19 NOTE — Telephone Encounter (Signed)
Refill on oxyCODONE-acetaminophen (PERCOCET) 10-325 MG per tablet last refill 02/02/14 wants to know if they get enough to take 2 pills instead of one, one isnt touching the pain.

## 2014-02-19 NOTE — Telephone Encounter (Signed)
Ok for 60 tabs, NTBS to discuss pain management.

## 2014-02-19 NOTE — Telephone Encounter (Signed)
Prescription printed and patient made aware to come to office to pick up.   Appointment scheduled for 02/23/2014 @ 4:30pm.

## 2014-02-23 ENCOUNTER — Ambulatory Visit (INDEPENDENT_AMBULATORY_CARE_PROVIDER_SITE_OTHER): Payer: No Typology Code available for payment source | Admitting: Family Medicine

## 2014-02-23 ENCOUNTER — Encounter: Payer: Self-pay | Admitting: Family Medicine

## 2014-02-23 VITALS — BP 110/66 | HR 80 | Temp 98.2°F | Resp 18 | Ht 72.0 in | Wt 259.0 lb

## 2014-02-23 DIAGNOSIS — M549 Dorsalgia, unspecified: Secondary | ICD-10-CM

## 2014-02-24 ENCOUNTER — Encounter: Payer: Self-pay | Admitting: Family Medicine

## 2014-02-24 DIAGNOSIS — M5136 Other intervertebral disc degeneration, lumbar region: Secondary | ICD-10-CM | POA: Insufficient documentation

## 2014-02-24 NOTE — Progress Notes (Signed)
Subjective:    Patient ID: Ricky Rios, male    DOB: 02-20-1951, 63 y.o.   MRN: [id number]  HPI Patient is here today to discuss pain management. He had severe low back pain stemming from degenerative disc disease, spondylosis, and bulging disc in his lower back. Recent MRI results are dictated below:  L1-2: Broad-based left foraminal disc protrusion with mass effect on  the left L1 nerve root. No spinal or lateral recess stenosis.  L2-3: Broad-based extra foraminal and far lateral disc protrusion on  the right along with spurring changes displacing the extra foraminal  right L2 nerve root. There is also a diffuse bulging annulus and  moderate facet disease with mild bilateral lateral recess stenosis.  No significant spinal stenosis.  L3-4: Broad-based bulging annulus, right paracentral disc extrusion,  short pedicles and facet disease contribute to moderate spinal and  bilateral lateral recess stenosis and moderate left foraminal  stenosis.  L4-5: Broad-based disc protrusion with moderate mass effect on the  ventral thecal sac. This in combination with moderate facet disease  creates moderate spinal and bilateral lateral recess stenosis. There  is also mild left foraminal encroachment.  L5-S1: Surgical changes noted with a right laminectomy defect.  Central and left paracentral disc protrusion with mass effect on the  left S1 nerve root. No significant foraminal stenosis. Advanced  facet disease.  He continues to have pain radiating to his right gluteus and into his right posterior thigh. He is seeing orthopedics was performed 2 epidural steroid injections. Unfortunately over the last week his pain has improved dramatically. He is now taking one half Percocet tablet in the morning and one half Percocet tablet in the evening. He is now able to wall using a walker whereas before he was wheelchair-bound. He is scheduled to see Dr. Rolena Infante to discuss possible surgery. He is still out  of work. He is interested in filing for disability. Past Medical History  Diagnosis Date  . Obesity   . Sleep apnea   . Chest pain     Diagnostic cardiac cath October 2012. Normal coronary arteries, left ventricular function  . Right knee injury   . Diabetes mellitus   . Hypertension   . Smoker   . HLD (hyperlipidemia) 12/09/2012  . History of back surgery   . History of neck surgery   . DDD (degenerative disc disease), lumbar    Current Outpatient Prescriptions on File Prior to Visit  Medication Sig Dispense Refill  . oxyCODONE-acetaminophen (PERCOCET) 10-325 MG per tablet Take 1 tablet by mouth every 4 (four) hours as needed for pain.  60 tablet  0   No current facility-administered medications on file prior to visit.   Allergies  Allergen Reactions  . Lipitor [Atorvastatin Calcium] Other (See Comments)    shaking  . Sulfa Antibiotics Other (See Comments)    unknown  . Zocor [Simvastatin - High Dose] Other (See Comments)    shaking   History   Social History  . Marital Status: Married    Spouse Name: N/A    Number of Children: N/A  . Years of Education: N/A   Occupational History  . Not on file.   Social History Main Topics  . Smoking status: Current Every Day Smoker -- 2.00 packs/day    Types: Cigarettes  . Smokeless tobacco: Not on file  . Alcohol Use: No  . Drug Use: No  . Sexual Activity: Not on file   Other Topics Concern  . Not on  file   Social History Narrative  . No narrative on file      Review of Systems  Musculoskeletal: Positive for back pain.       Objective:   Physical Exam  Vitals reviewed. Cardiovascular: Normal rate and regular rhythm.   Pulmonary/Chest: Effort normal and breath sounds normal.  Musculoskeletal:       Lumbar back: He exhibits decreased range of motion, tenderness and pain. He exhibits no bony tenderness.          Assessment & Plan:  1. Severe back pain Pain is gradually improving. I'll continue to give  him Percocet 10/325 one to 2 tablets by mouth daily 60 tablets should last at least one month at his current pain level.

## 2014-03-12 ENCOUNTER — Ambulatory Visit (INDEPENDENT_AMBULATORY_CARE_PROVIDER_SITE_OTHER): Payer: Self-pay | Admitting: Internal Medicine

## 2014-03-12 ENCOUNTER — Encounter: Payer: Self-pay | Admitting: Internal Medicine

## 2014-03-12 ENCOUNTER — Other Ambulatory Visit (INDEPENDENT_AMBULATORY_CARE_PROVIDER_SITE_OTHER): Payer: Self-pay

## 2014-03-12 VITALS — BP 120/70 | HR 72 | Ht 72.0 in | Wt 260.6 lb

## 2014-03-12 DIAGNOSIS — R932 Abnormal findings on diagnostic imaging of liver and biliary tract: Secondary | ICD-10-CM

## 2014-03-12 DIAGNOSIS — K7460 Unspecified cirrhosis of liver: Secondary | ICD-10-CM

## 2014-03-12 DIAGNOSIS — Z86010 Personal history of colonic polyps: Secondary | ICD-10-CM

## 2014-03-12 DIAGNOSIS — K760 Fatty (change of) liver, not elsewhere classified: Secondary | ICD-10-CM

## 2014-03-12 DIAGNOSIS — K7689 Other specified diseases of liver: Secondary | ICD-10-CM

## 2014-03-12 LAB — PROTIME-INR
INR: 1 ratio (ref 0.8–1.0)
Prothrombin Time: 11.2 s (ref 9.6–13.1)

## 2014-03-12 NOTE — Progress Notes (Signed)
HISTORY OF PRESENT ILLNESS:  Ricky Rios is a 63 y.o. male who is sent by Dr. Lelon Frohlich regarding cirrhosis of liver. The patient is accompanied by his wife. He has a long history of morbid obesity, sleep apnea, glucose intolerance, and degenerative disc disease of the lumbar spine. Patient has prior history of GI evaluations elsewhere (Dr. Cristina Gong) with screening colonoscopy 2005 revealing hyperplastic polyps. Patient has not previously been known to have liver disease. No family history of liver disease. No significant alcohol use, particularly in recent years. No prior history of hepatitis. He is on no medications regularly. Over-the-counter agents. Hepatitis serologies were negative as were iron studies. His BMI is 35. CT scan of the abdomen and pelvis with contrast was performed 01/31/2014 to evaluate lower abdominal and lower back pain. Examination revealed diffuse fatty infiltration of the liver with nodular contour suggesting cirrhosis. No evidence for portal hypertension. His liver tests, albumin, protein are normal. CBC is normal except for mildly depressed platelets 118,000.  REVIEW OF SYSTEMS:  All non-GI ROS negative except for sinus and allergy, arthritis, fatigue, cough, visual change, itching, muscle cramps, back pain, skin rash  Past Medical History  Diagnosis Date  . Obesity   . Sleep apnea   . Chest pain     Diagnostic cardiac cath October 2012. Normal coronary arteries, left ventricular function  . Right knee injury   . Diabetes mellitus   . Hypertension   . Smoker   . HLD (hyperlipidemia) 12/09/2012  . History of back surgery   . History of neck surgery   . DDD (degenerative disc disease), lumbar     Past Surgical History  Procedure Laterality Date  . Cardiac catheterization  Oct 2012    nml coronary arteries, aorta, LV fcn  . Back surgery      for ruptured disc lumbar area  . Neck surgery      for ruptured disc  . Appendectomy    . Tonsillectomy    . I&d  extremity Right 02/06/2013    Procedure: IRRIGATION AND DEBRIDEMENT INDEX FINGER;  Surgeon: Tennis Must, MD;  Location: [facility];  Service: Orthopedics;  Laterality: Right;    Social History KEEVEN MATTY  reports that he has been smoking Cigarettes.  He has been smoking about 2.00 packs per day. He has never used smokeless tobacco. He reports that he does not drink alcohol or use illicit drugs.  family history includes Dementia in his mother; Heart disease in his father.  Allergies  Allergen Reactions  . Lipitor [Atorvastatin Calcium] Other (See Comments)    shaking  . Sulfa Antibiotics Other (See Comments)    unknown  . Zocor [Simvastatin - High Dose] Other (See Comments)    shaking       PHYSICAL EXAMINATION: Vital signs: BP 120/70  Pulse 72  Ht 6' (1.829 m)  Wt 260 lb 9.6 oz (118.207 kg)  BMI 35.34 kg/m2  Constitutional: Obese, Unhealthy-appearing, no acute distress Psychiatric: alert and oriented x3, cooperative Eyes: extraocular movements intact, anicteric, conjunctiva pink Mouth: oral pharynx moist, no lesions Neck: supple no lymphadenopathy Cardiovascular: heart regular rate and rhythm, no murmur Lungs: clear to auscultation bilaterally Abdomen: soft, nontender, nondistended, no obvious ascites, no peritoneal signs, normal bowel sounds, no organomegaly Extremities: Trace lower extremity edema bilaterally Skin: no lesions on visible extremities. No spider telangiectasia Neuro: No focal deficits. No asterixis.    ASSESSMENT:  #1. Hepatic cirrhosis. Evidence on CT scan with probable mild portal hypertension based on  slightly depressed platelet count. 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 No evidence of decompensation ( #2. Abnormal imaging. As discussed #3. Morbid obesity #4. Chronic tobacco abuse #5. General medical problems     PLAN:  #1. Discussed hepatic cirrhosis in general. Also discussed that he is currently compensated but long-term complications might  include edema, ascites, bleeding related to portal hypertension, and encephalopathy. Also discussed more specifically NASH. No indication for biopsy at this time as it is quite unlikely to affect management #2. Obtain supple no laboratories for other causes of liver disease as well as PT/INR to evaluate hepatic synthetic function #3. Exercise #4. Weight loss #5. Stop smoking #6. Routine office followup in 1 year. Followup sooner if needed. Return to your PCP in the interim.

## 2014-03-12 NOTE — Patient Instructions (Signed)
Your physician has requested that you go to the basement for lab work before leaving today  As discussed with Dr. Henrene Pastor, continue to work on weight loss, smoking cessation, and increase exercise

## 2014-03-13 LAB — ANA: ANA: NEGATIVE

## 2014-03-13 LAB — MITOCHONDRIAL ANTIBODIES: Mitochondrial M2 Ab, IgG: 0.12 (ref <0.91)

## 2014-03-16 LAB — CERULOPLASMIN: CERULOPLASMIN: 33 mg/dL (ref 18–36)

## 2014-03-16 LAB — ALPHA-1-ANTITRYPSIN: A1 ANTITRYPSIN SER: 144 mg/dL (ref 83–199)

## 2014-03-16 LAB — ANTI-SMOOTH MUSCLE ANTIBODY, IGG: SMOOTH MUSCLE AB: 6 U (ref <20)

## 2014-04-07 ENCOUNTER — Encounter: Payer: Self-pay | Admitting: Family Medicine

## 2014-04-07 ENCOUNTER — Ambulatory Visit (INDEPENDENT_AMBULATORY_CARE_PROVIDER_SITE_OTHER): Payer: Medicaid Other | Admitting: Family Medicine

## 2014-04-07 VITALS — BP 110/68 | HR 78 | Temp 98.4°F | Resp 18 | Ht 72.0 in | Wt 268.0 lb

## 2014-04-07 DIAGNOSIS — M5431 Sciatica, right side: Secondary | ICD-10-CM

## 2014-04-07 DIAGNOSIS — F17200 Nicotine dependence, unspecified, uncomplicated: Secondary | ICD-10-CM

## 2014-04-07 DIAGNOSIS — Z716 Tobacco abuse counseling: Secondary | ICD-10-CM

## 2014-04-07 DIAGNOSIS — Z7189 Other specified counseling: Secondary | ICD-10-CM

## 2014-04-07 DIAGNOSIS — M5430 Sciatica, unspecified side: Secondary | ICD-10-CM

## 2014-04-07 MED ORDER — OXYCODONE-ACETAMINOPHEN 10-325 MG PO TABS: 1.0000 | ORAL_TABLET | ORAL | Status: DC | PRN

## 2014-04-07 NOTE — Progress Notes (Signed)
Subjective:    Patient ID: Ricky Rios, male    DOB: 09-13-1950, 63 y.o.   MRN: [id number]  HPI 02/23/14 Patient is here today to discuss pain management. He had severe low back pain stemming from degenerative disc disease, spondylosis, and bulging disc in his lower back. Recent MRI results are dictated below:  L1-2: Broad-based left foraminal disc protrusion with mass effect on  the left L1 nerve root. No spinal or lateral recess stenosis.  L2-3: Broad-based extra foraminal and far lateral disc protrusion on  the right along with spurring changes displacing the extra foraminal  right L2 nerve root. There is also a diffuse bulging annulus and  moderate facet disease with mild bilateral lateral recess stenosis.  No significant spinal stenosis.  L3-4: Broad-based bulging annulus, right paracentral disc extrusion,  short pedicles and facet disease contribute to moderate spinal and  bilateral lateral recess stenosis and moderate left foraminal  stenosis.  L4-5: Broad-based disc protrusion with moderate mass effect on the  ventral thecal sac. This in combination with moderate facet disease  creates moderate spinal and bilateral lateral recess stenosis. There  is also mild left foraminal encroachment.  L5-S1: Surgical changes noted with a right laminectomy defect.  Central and left paracentral disc protrusion with mass effect on the  left S1 nerve root. No significant foraminal stenosis. Advanced  facet disease.  He continues to have pain radiating to his right gluteus and into his right posterior thigh. He is seeing orthopedics who has performed 2 epidural steroid injections. Unfortunately over the last week his pain has improved dramatically. He is now taking one half Percocet tablet in the morning and one half Percocet tablet in the evening. He is now able to walk using a walker whereas before he was wheelchair-bound. He is scheduled to see Dr. Rolena Infante to discuss possible surgery. He  is still out of work. He is interested in filing for disability.  At that time, my plan was:  1. Severe back pain Pain is gradually improving. I'll continue to give him Percocet 10/325 one to 2 tablets by mouth daily 60 tablets should last at least one month at his current pain level.  04/07/14 Patient is here today for followup. He has met with Dr. Rolena Infante. Dr. Rolena Infante says he is too high risk surgical candidate and surgery is not an option. This is primarily due to his smoking and his diabetes. At the present time, ortho is suggesting Cortizone injections up to 3 every 6 months as needed.  Dr. Rolena Infante has informed the patient that he will not be able to return to work. The patient's job includes driving a truck, and delivering heavy materials. He is strictly required to lift greater than 50 pounds including paper, medical supplies, etc. He is unable to lift any heavy weight due to severe pain in his back. He still has pain in his lower back it radiates into his right hip and into his right leg. He continues to ambulate around the home using a wheelchair. He is able to walk approximately 15 minutes at a time as long as he uses a cane and relies heavily on a cane. He is unable to have a fast pace or change directions quickly. He is unable to flex greater than 30 due to pain in his back. He is using 1-2 Percocets per day primarily in the morning and at night to help him sleep.  He also needs a note for work stating that he is going  to be unable to return to work because of the abdomen and his back so that the releases 401(k).   Past Medical History  Diagnosis Date  . Obesity   . Sleep apnea   . Chest pain     Diagnostic cardiac cath October 2012. Normal coronary arteries, left ventricular function  . Right knee injury   . Diabetes mellitus   . Hypertension   . Smoker   . HLD (hyperlipidemia) 12/09/2012  . History of back surgery   . History of neck surgery   . DDD (degenerative disc disease), lumbar     Current Outpatient Prescriptions on File Prior to Visit  Medication Sig Dispense Refill  . oxyCODONE-acetaminophen (PERCOCET) 10-325 MG per tablet Take 1 tablet by mouth every 4 (four) hours as needed for pain.  60 tablet  0   No current facility-administered medications on file prior to visit.   Allergies  Allergen Reactions  . Lipitor [Atorvastatin Calcium] Other (See Comments)    shaking  . Sulfa Antibiotics Other (See Comments)    unknown  . Zocor [Simvastatin - High Dose] Other (See Comments)    shaking   History   Social History  . Marital Status: Married    Spouse Name: N/A    Number of Children: 2  . Years of Education: N/A   Occupational History  . Disabled    Social History Main Topics  . Smoking status: Current Every Day Smoker -- 2.00 packs/day    Types: Cigarettes  . Smokeless tobacco: Never Used  . Alcohol Use: No  . Drug Use: No  . Sexual Activity: Not on file   Other Topics Concern  . Not on file   Social History Narrative  . No narrative on file      Review of Systems  Musculoskeletal: Positive for back pain.       Objective:   Physical Exam  Vitals reviewed. Cardiovascular: Normal rate and regular rhythm.   Pulmonary/Chest: Effort normal and breath sounds normal.  Musculoskeletal:       Lumbar back: He exhibits decreased range of motion, tenderness and pain. He exhibits no bony tenderness.          Assessment & Plan:  Right sided sciatica - Plan: oxyCODONE-acetaminophen (PERCOCET) 10-325 MG per tablet  Encounter for smoking cessation counseling   The patient's oxycodone/acetaminophen 10/325 mg 1 by mouth twice a day when necessary pain. I will draft a letter stating that the patient is unable to return to work due to his inability to lift weight and his chronic right-sided low back pain and right-sided sciatica which is not amenable to surgical correction.    He also had a 20 minute discussion regarding smoking cessation.  Discussed numerous options. I also recommended Chantix and gave the patient started packet on Chantix.  We discussed the slight increased risk of depression and suicide with Chantix and the patient wishes to proceed.

## 2014-06-01 ENCOUNTER — Encounter: Payer: Self-pay | Admitting: Family Medicine

## 2014-06-01 ENCOUNTER — Ambulatory Visit (INDEPENDENT_AMBULATORY_CARE_PROVIDER_SITE_OTHER): Payer: Medicaid Other | Admitting: Family Medicine

## 2014-06-01 VITALS — BP 112/68 | HR 72 | Temp 98.2°F | Resp 20 | Ht 72.0 in | Wt 268.0 lb

## 2014-06-01 DIAGNOSIS — J441 Chronic obstructive pulmonary disease with (acute) exacerbation: Secondary | ICD-10-CM

## 2014-06-01 DIAGNOSIS — M7021 Olecranon bursitis, right elbow: Secondary | ICD-10-CM

## 2014-06-01 DIAGNOSIS — M5431 Sciatica, right side: Secondary | ICD-10-CM | POA: Diagnosis not present

## 2014-06-01 MED ORDER — PREDNISONE 20 MG PO TABS: ORAL_TABLET | ORAL | Status: DC

## 2014-06-01 MED ORDER — OXYCODONE-ACETAMINOPHEN 10-325 MG PO TABS: 1.0000 | ORAL_TABLET | ORAL | Status: DC | PRN

## 2014-06-01 MED ORDER — LEVOFLOXACIN 500 MG PO TABS: 500.0000 mg | ORAL_TABLET | Freq: Every day | ORAL | Status: DC

## 2014-06-01 MED ORDER — ALBUTEROL SULFATE HFA 108 (90 BASE) MCG/ACT IN AERS: 2.0000 | INHALATION_SPRAY | Freq: Four times a day (QID) | RESPIRATORY_TRACT | Status: DC | PRN

## 2014-06-01 NOTE — Progress Notes (Signed)
   Subjective:    Patient ID: Ricky Rios, male    DOB: [birth year], 63 y.o.   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 Over the last week he has developed a harsh cough productive of white and brown sputum.  He denies any fever but he is short of breath. He is wheezing on exam with diffuse Rales and rhonchi.  He also has swelling over his right olecranon process. It is not erythematous. It is not tender. It has been there for over a month. He is also requesting a refill on his Percocet. He uses one pill a day for his severe low back pain and sciatica. Past Medical History  Diagnosis Date  . Obesity   . Sleep apnea   . Chest pain     Diagnostic cardiac cath October 2012. Normal coronary arteries, left ventricular function  . Right knee injury   . Diabetes mellitus   . Hypertension   . Smoker   . HLD (hyperlipidemia) 12/09/2012  . History of back surgery   . History of neck surgery   . DDD (degenerative disc disease), lumbar    Past Surgical History  Procedure Laterality Date  . Cardiac catheterization  Oct 2012    nml coronary arteries, aorta, LV fcn  . Back surgery      for ruptured disc lumbar area  . Neck surgery      for ruptured disc  . Appendectomy    . Tonsillectomy    . I&d extremity Right 02/06/2013    Procedure: IRRIGATION AND DEBRIDEMENT INDEX FINGER;  Surgeon: Tennis Must, MD;  Location: [facility];  Service: Orthopedics;  Laterality: Right;   No current outpatient prescriptions on file prior to visit.   No current facility-administered medications on file prior to visit.   Allergies  Allergen Reactions  . Lipitor [Atorvastatin Calcium] Other (See Comments)    shaking  . Sulfa Antibiotics Other (See Comments)    unknown  . Zocor [Simvastatin - High Dose] Other (See Comments)    shaking  .soc    Review of Systems  All other systems reviewed and are negative.      Objective:   Physical Exam  Constitutional: He appears well-developed and well-nourished.  Cardiovascular: Normal rate and regular rhythm.   No murmur heard. Pulmonary/Chest: Effort normal. He has wheezes. He has rales.  Abdominal: Soft. Bowel sounds are normal.  Musculoskeletal: He exhibits no tenderness.  Vitals reviewed.         Assessment & Plan:  Right sided sciatica - Plan: oxyCODONE-acetaminophen (PERCOCET) 10-325 MG per tablet  COPD exacerbation - Plan: predniSONE (DELTASONE) 20 MG tablet, levofloxacin (LEVAQUIN) 500 MG tablet, albuterol (PROVENTIL HFA;VENTOLIN HFA) 108 (90 BASE) MCG/ACT inhaler  Olecranon bursitis, right  I will treat the patient's COPD exacerbation with a prednisone taper pack, Levaquin 500 mg by mouth daily for 7 days, and albuterol 2 puffs inhaled every 4 hours as needed for wheezing. Recheck in 1 week if no better or sooner if worse. Patient has olecranon bursitis and he elects not to treat this while we are treating his COPD exacerbation. I did  refill his Percocet (60 tabs for 2 months).

## 2014-07-07 ENCOUNTER — Ambulatory Visit (INDEPENDENT_AMBULATORY_CARE_PROVIDER_SITE_OTHER): Payer: Medicaid Other | Admitting: Family Medicine

## 2014-07-07 ENCOUNTER — Encounter: Payer: Self-pay | Admitting: Family Medicine

## 2014-07-07 VITALS — BP 112/70 | HR 80 | Temp 97.8°F | Resp 18 | Wt 272.0 lb

## 2014-07-07 DIAGNOSIS — R609 Edema, unspecified: Secondary | ICD-10-CM

## 2014-07-07 DIAGNOSIS — R7309 Other abnormal glucose: Secondary | ICD-10-CM

## 2014-07-07 DIAGNOSIS — G4733 Obstructive sleep apnea (adult) (pediatric): Secondary | ICD-10-CM

## 2014-07-07 DIAGNOSIS — R7303 Prediabetes: Secondary | ICD-10-CM

## 2014-07-07 DIAGNOSIS — K7581 Nonalcoholic steatohepatitis (NASH): Secondary | ICD-10-CM

## 2014-07-07 LAB — COMPLETE METABOLIC PANEL WITH GFR
ALT: 31 U/L (ref 0–53)
AST: 52 U/L — AB (ref 0–37)
Albumin: 4.1 g/dL (ref 3.5–5.2)
Alkaline Phosphatase: 83 U/L (ref 39–117)
BUN: 13 mg/dL (ref 6–23)
CO2: 24 mEq/L (ref 19–32)
Calcium: 9.8 mg/dL (ref 8.4–10.5)
Chloride: 103 mEq/L (ref 96–112)
Creat: 0.9 mg/dL (ref 0.50–1.35)
GFR, Est African American: >89 mL/min
GFR, Est Non African American: >89 mL/min
Glucose, Bld: 175 mg/dL — ABNORMAL HIGH (ref 70–99)
Potassium: 4.1 mEq/L (ref 3.5–5.3)
Sodium: 137 mEq/L (ref 135–145)
Total Bilirubin: 0.5 mg/dL (ref 0.2–1.2)
Total Protein: 7.4 g/dL (ref 6.0–8.3)

## 2014-07-07 LAB — CBC WITH DIFFERENTIAL/PLATELET
BASOS ABS: 0.1 10*3/uL (ref 0.0–0.1)
Basophils Relative: 1 % (ref 0–1)
Eosinophils Absolute: 0.4 10*3/uL (ref 0.0–0.7)
Eosinophils Relative: 4 % (ref 0–5)
HEMATOCRIT: 42.2 % (ref 39.0–52.0)
Hemoglobin: 14.6 g/dL (ref 13.0–17.0)
LYMPHS PCT: 38 % (ref 12–46)
Lymphs Abs: 3.4 10*3/uL (ref 0.7–4.0)
MCH: 29.7 pg (ref 26.0–34.0)
MCHC: 34.6 g/dL (ref 30.0–36.0)
MCV: 85.8 fL (ref 78.0–100.0)
MONO ABS: 0.5 10*3/uL (ref 0.1–1.0)
MONOS PCT: 6 % (ref 3–12)
MPV: 10.1 fL (ref 9.4–12.4)
Neutro Abs: 4.6 10*3/uL (ref 1.7–7.7)
Neutrophils Relative %: 51 % (ref 43–77)
Platelets: 153 10*3/uL (ref 150–400)
RBC: 4.92 MIL/uL (ref 4.22–5.81)
RDW: 15.3 % (ref 11.5–15.5)
WBC: 9 10*3/uL (ref 4.0–10.5)

## 2014-07-07 LAB — HEMOGLOBIN A1C
Hgb A1c MFr Bld: 6.8 % — ABNORMAL HIGH (ref <5.7)
Mean Plasma Glucose: 148 mg/dL — ABNORMAL HIGH (ref <117)

## 2014-07-07 LAB — LIPID PANEL
CHOL/HDL RATIO: 6.6 ratio
Cholesterol: 238 mg/dL — ABNORMAL HIGH (ref 0–200)
HDL: 36 mg/dL — AB (ref >39)
LDL CALC: 127 mg/dL — AB (ref 0–99)
TRIGLYCERIDES: 374 mg/dL — AB (ref <150)
VLDL: 75 mg/dL — AB (ref 0–40)

## 2014-07-07 MED ORDER — FUROSEMIDE 40 MG PO TABS: 40.0000 mg | ORAL_TABLET | Freq: Every day | ORAL | Status: DC

## 2014-07-07 NOTE — Progress Notes (Signed)
Subjective:    Patient ID: Ricky Rios, male    DOB: 07-15-1951, 63 y.o.   MRN: [id number]  HPI   patient has a history of cirrhosis. On a CT scan he also had portal hypertension. It was presumed to be secondary to an Coffee as remainder of his laboratory workup is normal. Patient has not had a biopsy to confirm this. Recently he reports increasing edema in his legs. He also reports  Increasing abdominal girth. He denies any chest pain or shortness of breath. He denies any angina. He denies any orthopnea. On examination today he has +1 edema in both legs. He denies any melena or hematochezia. He also has a history of diet-controlled diabetes mellitus and he is overdue to recheck this. His blood pressures well controlled. Unfortunately he continues to smoke. Past Medical History  Diagnosis Date  . Obesity   . Sleep apnea   . Chest pain     Diagnostic cardiac cath October 2012. Normal coronary arteries, left ventricular function  . Right knee injury   . Diabetes mellitus   . Hypertension   . Smoker   . HLD (hyperlipidemia) 12/09/2012  . History of back surgery   . History of neck surgery   . DDD (degenerative disc disease), lumbar    Past Surgical History  Procedure Laterality Date  . Cardiac catheterization  Oct 2012    nml coronary arteries, aorta, LV fcn  . Back surgery      for ruptured disc lumbar area  . Neck surgery      for ruptured disc  . Appendectomy    . Tonsillectomy    . I&d extremity Right 02/06/2013    Procedure: IRRIGATION AND DEBRIDEMENT INDEX FINGER;  Surgeon: Tennis Must, MD;  Location: [facility];  Service: Orthopedics;  Laterality: Right;   Current Outpatient Prescriptions on File Prior to Visit  Medication Sig Dispense Refill  . albuterol (PROVENTIL HFA;VENTOLIN HFA) 108 (90 BASE) MCG/ACT inhaler Inhale 2 puffs into the lungs every 6 (six) hours as needed for wheezing or shortness of breath. 1 Inhaler 0  . oxyCODONE-acetaminophen (PERCOCET) 10-325 MG per  tablet Take 1 tablet by mouth every 4 (four) hours as needed for pain. 60 tablet 0   No current facility-administered medications on file prior to visit.   Allergies  Allergen Reactions  . Lipitor [Atorvastatin Calcium] Other (See Comments)    shaking  . Sulfa Antibiotics Other (See Comments)    unknown  . Zocor [Simvastatin - High Dose] Other (See Comments)    shaking   History   Social History  . Marital Status: Married    Spouse Name: N/A    Number of Children: 2  . Years of Education: N/A   Occupational History  . Disabled    Social History Main Topics  . Smoking status: Current Every Day Smoker -- 2.00 packs/day    Types: Cigarettes  . Smokeless tobacco: Never Used  . Alcohol Use: No  . Drug Use: No  . Sexual Activity: Not on file   Other Topics Concern  . Not on file   Social History Narrative     Review of Systems  All other systems reviewed and are negative.      Objective:   Physical Exam  Neck: No JVD present.  Cardiovascular: Normal rate, regular rhythm and normal heart sounds.   No murmur heard. Pulmonary/Chest: Effort normal and breath sounds normal. No respiratory distress. He has no wheezes. He has no  rales.  Abdominal: Soft. Bowel sounds are normal. He exhibits no distension. There is no tenderness. There is no rebound and no guarding.  Musculoskeletal: He exhibits edema.  Vitals reviewed.         Assessment & Plan:  Edema - Plan: furosemide (LASIX) 40 MG tablet  Prediabetes - Plan: COMPLETE METABOLIC PANEL WITH GFR, CBC with Differential, Lipid panel, Hemoglobin A1c  NASH (nonalcoholic steatohepatitis)   I do not believe the patient's swelling is due to congestive heart failure or renal problems. I believe it is due to his cirrhosis and portal hypertension. I will start the patient on Lasix 40 mg by mouth daily. I will check a CMP, CBC, fasting lipid panel, and hemoglobin A1c. Patient has a history of obstructive sleep apnea and he is  not compliant with CPAP. I'm 1 to arrange the patient for a sleep study to make sure that he has an appropriately titrated CPAP. Hopefully this will help reduce the chance of right-sided heart failure due to pulmonary hypertension. I spent over 20 minutes discussing NASH. I recommended extensive dietary changes. I recommended he become basically a vegetarian and try to avoid meat in saturated fat. I recommended he increase his exercise as much as tolerated. Ideally the patient's goal weight would be near 200 pounds. At the present time this is the only option he has to help manage his NASH.

## 2014-08-17 ENCOUNTER — Encounter: Payer: Self-pay | Admitting: Family Medicine

## 2014-09-02 ENCOUNTER — Telehealth: Payer: Self-pay | Admitting: *Deleted

## 2014-09-02 NOTE — Telephone Encounter (Signed)
Wife called stating that pt is needing refill on his pain meds percocet 10-325mg  , and stated that Dr. Dennard Schaumann mentioned when pt was last seen month ago about switching pain medication from percocet to hydrocodone. Please advise!

## 2014-09-03 MED ORDER — OXYCODONE HCL 10 MG PO TABS: 10.0000 mg | ORAL_TABLET | Freq: Four times a day (QID) | ORAL | Status: DC

## 2014-09-03 NOTE — Telephone Encounter (Signed)
Pt aware and rx printed and left up front for pt to pick up

## 2014-09-03 NOTE — Telephone Encounter (Signed)
No, I want him off tylenol.  Refill oxycodone (alone) 10 mg poq 6 hrs prn pain (60).

## 2014-09-25 ENCOUNTER — Ambulatory Visit (INDEPENDENT_AMBULATORY_CARE_PROVIDER_SITE_OTHER): Payer: Medicaid Other | Admitting: Family Medicine

## 2014-09-25 ENCOUNTER — Encounter: Payer: Self-pay | Admitting: Family Medicine

## 2014-09-25 VITALS — BP 126/62 | HR 68 | Temp 98.0°F | Resp 12 | Ht 72.0 in | Wt 277.0 lb

## 2014-09-25 DIAGNOSIS — Z720 Tobacco use: Secondary | ICD-10-CM

## 2014-09-25 DIAGNOSIS — J441 Chronic obstructive pulmonary disease with (acute) exacerbation: Secondary | ICD-10-CM

## 2014-09-25 DIAGNOSIS — F17200 Nicotine dependence, unspecified, uncomplicated: Secondary | ICD-10-CM

## 2014-09-25 MED ORDER — PREDNISONE 20 MG PO TABS: 40.0000 mg | ORAL_TABLET | Freq: Every day | ORAL | Status: DC

## 2014-09-25 MED ORDER — DOXYCYCLINE HYCLATE 100 MG PO TABS: 100.0000 mg | ORAL_TABLET | Freq: Two times a day (BID) | ORAL | Status: DC

## 2014-09-25 MED ORDER — ALBUTEROL SULFATE HFA 108 (90 BASE) MCG/ACT IN AERS: 2.0000 | INHALATION_SPRAY | RESPIRATORY_TRACT | Status: DC | PRN

## 2014-09-25 NOTE — Patient Instructions (Signed)
Take antibiotics Use mucinex Prednisone take in the morning Inhaler prescribed Work on tobacco cessation F/U as needed

## 2014-09-27 NOTE — Progress Notes (Signed)
Patient ID: Ricky Rios, male   DOB: [birth year], 64 y.o.   MRN: [id number]   Subjective:    Patient ID: Ricky Rios, male    DOB: 1950-10-17, 64 y.o.   MRN: [id number]  Patient presents for Illness  Pt here with cough with wheezing, mild production, worsening over past 2 weeks. Started with head cold and is now in his chest. Has dx of COPD on chart but no inhaler use on regular basis. He smokes at least 1ppd. Denies CP, but has been SOB with regular activities since illness. Has used some OTC nyquil and cold and flu meds wit no improvement.    Review Of Systems:  GEN- denies fatigue, fever, weight loss,weakness, recent illness HEENT- denies eye drainage, change in vision, nasal discharge, CVS- denies chest pain, palpitations RESP- + SOB, +cough, +wheeze ABD- denies N/V, change in stools, abd pain Neuro- denies headache, dizziness, syncope, seizure activity       Objective:    BP 126/62 mmHg  Pulse 68  Temp(Src) 98 F (36.7 C) (Oral)  Resp 12  Ht 6' (1.829 m)  Wt 277 lb (125.646 kg)  BMI 37.56 kg/m2 GEN- NAD, alert and oriented x3 HEENT- PERRL, EOMI, non injected sclera, pink conjunctiva, MMM, oropharynx  clear, TM clear bilat no effusion,  no maxillary sinus tenderness, nares clear Neck- Supple, no LAD CVS- RRR, no murmur RESP-bilateral wheeze, normal WOB, congestion of upper airways clear with cough, no rales EXT- No edema Pulses- Radial 2+          Assessment & Plan:      Problem List Items Addressed This Visit      Unprioritized   Smoker    Other Visit Diagnoses    COPD exacerbation    -  Primary    Given prednisone, albuterol, antibiotics, Discussed importance of tobacco cessation. Does not appear to have many excerbations but may benefit from Spiriva    Relevant Medications    predniSONE (DELTASONE) tablet    albuterol (PROVENTIL HFA;VENTOLIN HFA) 108 (90 BASE) MCG/ACT inhaler       Note: This dictation was prepared with Dragon dictation  along with smaller phrase technology. Any transcriptional errors that result from this process are unintentional.

## 2014-10-19 ENCOUNTER — Ambulatory Visit (INDEPENDENT_AMBULATORY_CARE_PROVIDER_SITE_OTHER): Payer: Medicaid Other | Admitting: Family Medicine

## 2014-10-19 ENCOUNTER — Encounter: Payer: Self-pay | Admitting: Family Medicine

## 2014-10-19 VITALS — BP 100/60 | HR 70 | Temp 98.0°F | Resp 16 | Wt 265.0 lb

## 2014-10-19 DIAGNOSIS — E1165 Type 2 diabetes mellitus with hyperglycemia: Secondary | ICD-10-CM

## 2014-10-19 DIAGNOSIS — IMO0002 Reserved for concepts with insufficient information to code with codable children: Secondary | ICD-10-CM

## 2014-10-19 LAB — HEMOGLOBIN A1C, FINGERSTICK: Hgb A1C (fingerstick): 11.8 % — ABNORMAL HIGH (ref <5.7)

## 2014-10-19 MED ORDER — ACCU-CHEK SOFTCLIX LANCET DEV MISC: Status: DC

## 2014-10-19 MED ORDER — ACCU-CHEK AVIVA PLUS W/DEVICE KIT: PACK | Status: DC

## 2014-10-19 MED ORDER — INSULIN PEN NEEDLE 31G X 8 MM MISC: Status: DC

## 2014-10-19 MED ORDER — GLUCOSE BLOOD VI STRP: ORAL_STRIP | Status: DC

## 2014-10-19 NOTE — Progress Notes (Signed)
Subjective:    Patient ID: Ricky Rios, male    DOB: June 25, 1951, 64 y.o.   MRN: [id number]  HPI Patient is a 64 year old morbidly obese gentleman with a history of diabetes mellitus type 2. His previous hemoglobin A1c was 6.8 in December. Over the last 2 months, the patient reports polyuria, polydipsia, and blurred vision. He has lost 7 pounds in the last 2 months. Earlier this month he was started on prednisone for COPD exacerbation. In the last few days he started checking his blood sugar. His fasting blood sugar is 350 in the morning. His evening blood sugars are too high to be calculated by his meter. He is checked on several occasions in the meter on all those instances just reports "high."  Hemoglobin A1c has rapidly risen to 11.2 in just 3 months. Past Medical History  Diagnosis Date  . Obesity   . Sleep apnea   . Chest pain     Diagnostic cardiac cath October 2012. Normal coronary arteries, left ventricular function  . Right knee injury   . Diabetes mellitus   . Hypertension   . Smoker   . HLD (hyperlipidemia) 12/09/2012  . History of back surgery   . History of neck surgery   . DDD (degenerative disc disease), lumbar    Past Surgical History  Procedure Laterality Date  . Cardiac catheterization  Oct 2012    nml coronary arteries, aorta, LV fcn  . Back surgery      for ruptured disc lumbar area  . Neck surgery      for ruptured disc  . Appendectomy    . Tonsillectomy    . I&d extremity Right 02/06/2013    Procedure: IRRIGATION AND DEBRIDEMENT INDEX FINGER;  Surgeon: Tennis Must, MD;  Location: [facility];  Service: Orthopedics;  Laterality: Right;   Current Outpatient Prescriptions on File Prior to Visit  Medication Sig Dispense Refill  . albuterol (PROVENTIL HFA;VENTOLIN HFA) 108 (90 BASE) MCG/ACT inhaler Inhale 2 puffs into the lungs every 4 (four) hours as needed for wheezing or shortness of breath. 1 Inhaler 2  . Oxycodone HCl 10 MG TABS Take 1 tablet (10 mg  total) by mouth every 6 (six) hours. 60 tablet 0  . furosemide (LASIX) 40 MG tablet Take 1 tablet (40 mg total) by mouth daily. (Patient not taking: Reported on 10/19/2014) 30 tablet 3   No current facility-administered medications on file prior to visit.   Allergies  Allergen Reactions  . Lipitor [Atorvastatin Calcium] Other (See Comments)    shaking  . Sulfa Antibiotics Other (See Comments)    unknown  . Zocor [Simvastatin - High Dose] Other (See Comments)    shaking   History   Social History  . Marital Status: Married    Spouse Name: N/A  . Number of Children: 2  . Years of Education: N/A   Occupational History  . Disabled    Social History Main Topics  . Smoking status: Current Every Day Smoker -- 2.00 packs/day    Types: Cigarettes  . Smokeless tobacco: Never Used  . Alcohol Use: No  . Drug Use: No  . Sexual Activity: Not on file   Other Topics Concern  . Not on file   Social History Narrative      Review of Systems  All other systems reviewed and are negative.      Objective:   Physical Exam  Cardiovascular: Normal rate, regular rhythm and normal heart sounds.   Pulmonary/Chest:  Effort normal and breath sounds normal. No respiratory distress. He has no wheezes. He has no rales.  Abdominal: Soft. Bowel sounds are normal. He exhibits no distension. There is no tenderness. There is no rebound and no guarding.  Musculoskeletal: He exhibits no edema.  Vitals reviewed.         Assessment & Plan:  Diabetes mellitus type II, uncontrolled - Plan: Hemoglobin A1C, fingerstick  Based on the patient's weight, he is 120 kg.  Assuming 0.5 units per kilogram, the patient will need at least 60 units of Lantus/Toujeo to control his sugars.  I will start him on Toujeo 30 units SQ right now.  Increase Toujeo to 33 units in AM if FBS is >200.  Continue to increase toujeo by 3 units everyday until FBS are <200.  Recheck Friday.  Patient is to check her sugars 3 times a  day until we see him on Friday and we will make gross adjustment in his insulin based on his sugar values at that time it also recommended the patient increase his fluid intake.

## 2014-10-23 ENCOUNTER — Ambulatory Visit (INDEPENDENT_AMBULATORY_CARE_PROVIDER_SITE_OTHER): Payer: Medicaid Other | Admitting: Family Medicine

## 2014-10-23 ENCOUNTER — Encounter: Payer: Self-pay | Admitting: Family Medicine

## 2014-10-23 ENCOUNTER — Telehealth: Payer: Self-pay | Admitting: Family Medicine

## 2014-10-23 VITALS — BP 108/60 | HR 64 | Temp 98.0°F | Resp 18 | Ht 72.0 in | Wt 263.0 lb

## 2014-10-23 DIAGNOSIS — IMO0002 Reserved for concepts with insufficient information to code with codable children: Secondary | ICD-10-CM

## 2014-10-23 DIAGNOSIS — E1165 Type 2 diabetes mellitus with hyperglycemia: Secondary | ICD-10-CM | POA: Diagnosis not present

## 2014-10-23 NOTE — Telephone Encounter (Signed)
Pt had question about the insulin instructions provider gave him.  Reinforced the increase to 60 units daily.  Check fasting sugar each morning.  If over 130 to add an additional 3 units to daily dose each day.  If under 130 repeat dose from day before.  Take and keep log of FBS, 2hr PP reading and amt insulin given.  Call Tuesday to report to Dr Dennard Schaumann those results.

## 2014-10-23 NOTE — Progress Notes (Signed)
Subjective:    Patient ID: Ricky Rios, male    DOB: 12/10/1950, 64 y.o.   MRN: [id number]  HPI 10/19/14 Patient is a 64 year old morbidly obese gentleman with a history of diabetes mellitus type 2. His previous hemoglobin A1c was 6.8 in December. Over the last 2 months, the patient reports polyuria, polydipsia, and blurred vision. He has lost 7 pounds in the last 2 months. Earlier this month he was started on prednisone for COPD exacerbation. In the last few days he started checking his blood sugar. His fasting blood sugar is 350 in the morning. His evening blood sugars are too high to be calculated by his meter. He is checked on several occasions in the meter on all those instances just reports "high."  Hemoglobin A1c has rapidly risen to 11.2 in just 3 months.  At that time, my plan was: Based on the patient's weight, he is 120 kg.  Assuming 0.5 units per kilogram, the patient will need at least 60 units of Lantus/Toujeo to control his sugars.  I will start him on Toujeo 30 units SQ right now.  Increase Toujeo to 33 units in AM if FBS is >200.  Continue to increase toujeo by 3 units everyday until FBS are <200.  Recheck Friday.  Patient is to check her sugars 3 times a day until we see him on Friday and we will make gross adjustment in his insulin based on his sugar values at that time it also recommended the patient increase his fluid intake.  10/23/14 Patient's fasting blood sugar has consistently been 250-260 over the last 3 days. His two-hour postprandial sugars are typically around 300. He is currently on 40 units of Toujeo. Past Medical History  Diagnosis Date  . Obesity   . Sleep apnea   . Chest pain     Diagnostic cardiac cath October 2012. Normal coronary arteries, left ventricular function  . Right knee injury   . Diabetes mellitus   . Hypertension   . Smoker   . HLD (hyperlipidemia) 12/09/2012  . History of back surgery   . History of neck surgery   . DDD (degenerative disc  disease), lumbar    Past Surgical History  Procedure Laterality Date  . Cardiac catheterization  Oct 2012    nml coronary arteries, aorta, LV fcn  . Back surgery      for ruptured disc lumbar area  . Neck surgery      for ruptured disc  . Appendectomy    . Tonsillectomy    . I&d extremity Right 02/06/2013    Procedure: IRRIGATION AND DEBRIDEMENT INDEX FINGER;  Surgeon: Tennis Must, MD;  Location: [employer];  Service: Orthopedics;  Laterality: Right;   Current Outpatient Prescriptions on File Prior to Visit  Medication Sig Dispense Refill  . albuterol (PROVENTIL HFA;VENTOLIN HFA) 108 (90 BASE) MCG/ACT inhaler Inhale 2 puffs into the lungs every 4 (four) hours as needed for wheezing or shortness of breath. 1 Inhaler 2  . Blood Glucose Monitoring Suppl (ACCU-CHEK AVIVA PLUS) W/DEVICE KIT As directed - DX: E11.9 1 kit 0  . glucose blood test strip Check BS BID - DX: E11.9 100 each 12  . Insulin Pen Needle 31G X 8 MM MISC To inject QD for Insulin - 100 each 3  . Lancet Devices (ACCU-CHEK SOFTCLIX) lancets Use as instructed for BID BS check DX: E11.9 100 each 3  . Oxycodone HCl 10 MG TABS Take 1 tablet (10 mg total) by mouth  every 6 (six) hours. 60 tablet 0  . furosemide (LASIX) 40 MG tablet Take 1 tablet (40 mg total) by mouth daily. (Patient not taking: Reported on 10/23/2014) 30 tablet 3   No current facility-administered medications on file prior to visit.   Allergies  Allergen Reactions  . Lipitor [Atorvastatin Calcium] Other (See Comments)    shaking  . Sulfa Antibiotics Other (See Comments)    unknown  . Zocor [Simvastatin - High Dose] Other (See Comments)    shaking   History   Social History  . Marital Status: Married    Spouse Name: N/A  . Number of Children: 2  . Years of Education: N/A   Occupational History  . Disabled    Social History Main Topics  . Smoking status: Current Every Day Smoker -- 2.00 packs/day    Types: Cigarettes  . Smokeless tobacco: Never Used    . Alcohol Use: No  . Drug Use: No  . Sexual Activity: Not on file   Other Topics Concern  . Not on file   Social History Narrative      Review of Systems  All other systems reviewed and are negative.      Objective:   Physical Exam  Cardiovascular: Normal rate, regular rhythm and normal heart sounds.   Pulmonary/Chest: Effort normal and breath sounds normal. No respiratory distress. He has no wheezes. He has no rales.  Abdominal: Soft. Bowel sounds are normal. He exhibits no distension. There is no tenderness. There is no rebound and no guarding.  Musculoskeletal: He exhibits no edema.  Vitals reviewed.         Assessment & Plan:  Diabetes mellitus type II, uncontrolled  increase Toujeo to 60 units subcutaneous daily. Increase by 3 units every day until fasting blood sugars are less than 130. Recheck fasting blood sugars and two-hour postprandial sugars on Tuesday and make another gross adjustment at that time

## 2014-10-26 ENCOUNTER — Telehealth: Payer: Self-pay | Admitting: Family Medicine

## 2014-10-26 NOTE — Telephone Encounter (Signed)
Those numbers are outstanding. Let us continue 63 units a day and recheck here with fasting blood sugars and two-hour postprandial sugars in one week. If numbers remain in that range we can discuss possibly starting portal medication and weaning down on the insulin

## 2014-10-26 NOTE — Telephone Encounter (Signed)
Pt aware and appt made.

## 2014-10-26 NOTE — Telephone Encounter (Signed)
Pt called to report BS - FBS 127 and 2 pp was 157 on 63 untis

## 2014-11-03 ENCOUNTER — Encounter: Payer: Self-pay | Admitting: Family Medicine

## 2014-11-03 ENCOUNTER — Ambulatory Visit (INDEPENDENT_AMBULATORY_CARE_PROVIDER_SITE_OTHER): Payer: Medicaid Other | Admitting: Family Medicine

## 2014-11-03 VITALS — BP 118/64 | HR 68 | Temp 98.1°F | Resp 18 | Ht 72.0 in | Wt 265.0 lb

## 2014-11-03 DIAGNOSIS — Z794 Long term (current) use of insulin: Secondary | ICD-10-CM | POA: Diagnosis not present

## 2014-11-03 DIAGNOSIS — IMO0001 Reserved for inherently not codable concepts without codable children: Secondary | ICD-10-CM

## 2014-11-03 DIAGNOSIS — E119 Type 2 diabetes mellitus without complications: Secondary | ICD-10-CM

## 2014-11-03 MED ORDER — METFORMIN HCL 1000 MG PO TABS: 1000.0000 mg | ORAL_TABLET | Freq: Two times a day (BID) | ORAL | Status: DC

## 2014-11-03 NOTE — Progress Notes (Signed)
Subjective:    Patient ID: Ricky Rios, male    DOB: 05-03-1951, 64 y.o.   MRN: [id number]  HPI 10/19/14 Patient is a 64 year old morbidly obese gentleman with a history of diabetes mellitus type 2. His previous hemoglobin A1c was 6.8 in December. Over the last 2 months, the patient reports polyuria, polydipsia, and blurred vision. He has lost 7 pounds in the last 2 months. Earlier this month he was started on prednisone for COPD exacerbation. In the last few days he started checking his blood sugar. His fasting blood sugar is 350 in the morning. His evening blood sugars are too high to be calculated by his meter. He is checked on several occasions in the meter on all those instances just reports "high."  Hemoglobin A1c has rapidly risen to 11.2 in just 3 months.  At that time, my plan was: Based on the patient's weight, he is 120 kg.  Assuming 0.5 units per kilogram, the patient will need at least 60 units of Lantus/Toujeo to control his sugars.  I will start him on Toujeo 30 units SQ right now.  Increase Toujeo to 33 units in AM if FBS is >200.  Continue to increase toujeo by 3 units everyday until FBS are <200.  Recheck Friday.  Patient is to check her sugars 3 times a day until we see him on Friday and we will make gross adjustment in his insulin based on his sugar values at that time it also recommended the patient increase his fluid intake.  10/23/14 Patient's fasting blood sugar has consistently been 250-260 over the last 3 days. His two-hour postprandial sugars are typically around 300. He is currently on 40 units of Toujeo.  At that time, my plan was:  increase Toujeo to 60 units subcutaneous daily. Increase by 3 units every day until fasting blood sugars are less than 130. Recheck fasting blood sugars and two-hour postprandial sugars on Tuesday and make another gross adjustment at that time  11/03/14 Patient's fasting blood sugars are all less than 130. His two-hour postprandial sugars  are now all less than 160. He is maintain this for almost 2 weeks. He is currently on 63 units of Toujeo.  He denies any hypoglycemia. He would like to try to wean off the insulin. Past Medical History  Diagnosis Date  . Obesity   . Sleep apnea   . Chest pain     Diagnostic cardiac cath October 2012. Normal coronary arteries, left ventricular function  . Right knee injury   . Diabetes mellitus   . Hypertension   . Smoker   . HLD (hyperlipidemia) 12/09/2012  . History of back surgery   . History of neck surgery   . DDD (degenerative disc disease), lumbar    Past Surgical History  Procedure Laterality Date  . Cardiac catheterization  Oct 2012    nml coronary arteries, aorta, LV fcn  . Back surgery      for ruptured disc lumbar area  . Neck surgery      for ruptured disc  . Appendectomy    . Tonsillectomy    . I&d extremity Right 02/06/2013    Procedure: IRRIGATION AND DEBRIDEMENT INDEX FINGER;  Surgeon: Tennis Must, MD;  Location: [employer];  Service: Orthopedics;  Laterality: Right;   Current Outpatient Prescriptions on File Prior to Visit  Medication Sig Dispense Refill  . ACCU-CHEK SOFTCLIX LANCETS lancets   3  . albuterol (PROVENTIL HFA;VENTOLIN HFA) 108 (90 BASE) MCG/ACT inhaler  Inhale 2 puffs into the lungs every 4 (four) hours as needed for wheezing or shortness of breath. 1 Inhaler 2  . Blood Glucose Monitoring Suppl (ACCU-CHEK AVIVA PLUS) W/DEVICE KIT As directed - DX: E11.9 1 kit 0  . furosemide (LASIX) 40 MG tablet Take 1 tablet (40 mg total) by mouth daily. 30 tablet 3  . glucose blood test strip Check BS BID - DX: E11.9 100 each 12  . Insulin Glargine 300 UNIT/ML SOPN Inject 63 Units into the skin daily.     . Insulin Pen Needle 31G X 8 MM MISC To inject QD for Insulin - 100 each 3  . Lancet Devices (ACCU-CHEK SOFTCLIX) lancets Use as instructed for BID BS check DX: E11.9 100 each 3  . Oxycodone HCl 10 MG TABS Take 1 tablet (10 mg total) by mouth every 6 (six) hours. 60  tablet 0   No current facility-administered medications on file prior to visit.   Allergies  Allergen Reactions  . Lipitor [Atorvastatin Calcium] Other (See Comments)    shaking  . Sulfa Antibiotics Other (See Comments)    unknown  . Zocor [Simvastatin - High Dose] Other (See Comments)    shaking   History   Social History  . Marital Status: Married    Spouse Name: N/A  . Number of Children: 2  . Years of Education: N/A   Occupational History  . Disabled    Social History Main Topics  . Smoking status: Current Every Day Smoker -- 2.00 packs/day    Types: Cigarettes  . Smokeless tobacco: Never Used  . Alcohol Use: No  . Drug Use: No  . Sexual Activity: Not on file   Other Topics Concern  . Not on file   Social History Narrative      Review of Systems  All other systems reviewed and are negative.      Objective:   Physical Exam  Cardiovascular: Normal rate, regular rhythm and normal heart sounds.   Pulmonary/Chest: Effort normal and breath sounds normal. No respiratory distress. He has no wheezes. He has no rales.  Abdominal: Soft. Bowel sounds are normal. He exhibits no distension. There is no tenderness. There is no rebound and no guarding.  Musculoskeletal: He exhibits no edema.  Vitals reviewed.         Assessment & Plan:  IDDM (insulin dependent diabetes mellitus) - Plan: metFORMIN (GLUCOPHAGE) 1000 MG tablet  Add metformin 1000 mg by mouth twice a day and decrease Toujeo to 30 units a day and recheck BID sugars in 2 weeks and titrate further at that point depending on the results.

## 2014-11-10 IMAGING — CT CT ABD-PELV W/O CM
2 of 4 series · 4 of 46 positions shown, 5 images · non-contrast
Comparison: CT abdomen pelvis-01/22/2012

CLINICAL DATA: Back pain which radiates towards stomach and down
right leg, increases with movement

EXAM:
CT ABDOMEN AND PELVIS WITHOUT CONTRAST
TECHNIQUE: Multidetector CT imaging of the abdomen and pelvis was performed
following the standard protocol without IV contrast.

[Series 206: coronal · coronal · 0.50mm/px · 3 of 109 slices shown, 4 images]
[im 25/109  soft-tissue]
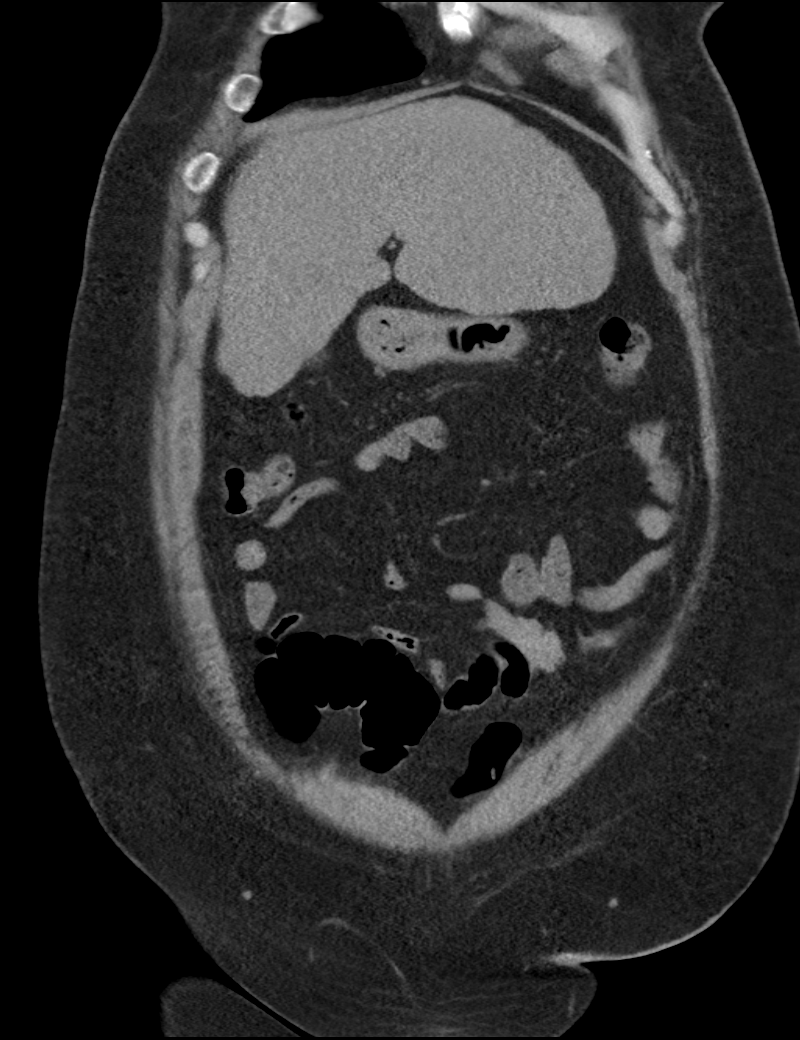
[im 25/109  bone]
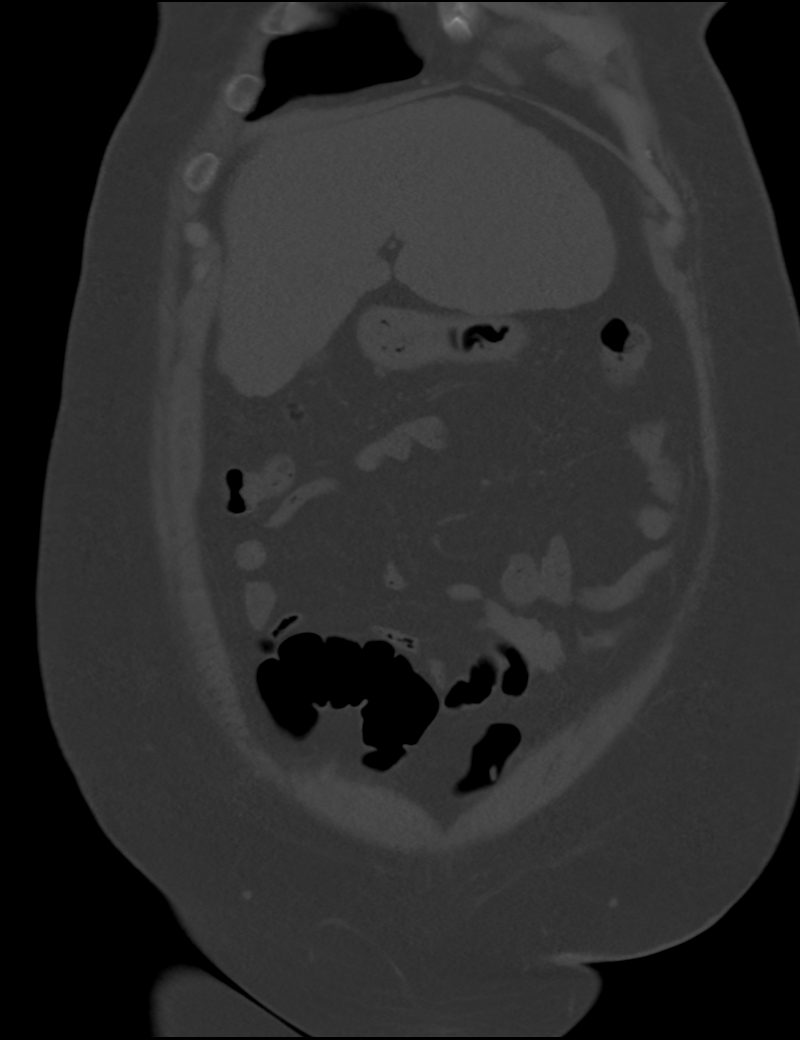
[im 61/109  soft-tissue]
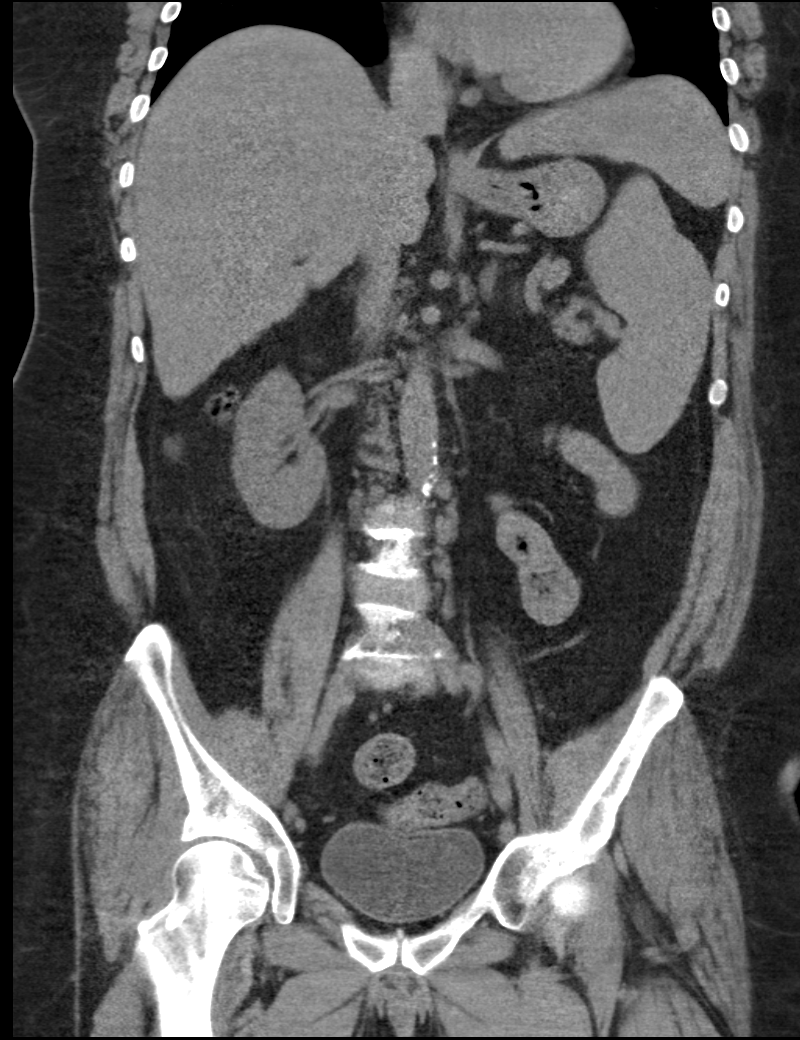
[im 85/109  soft-tissue]
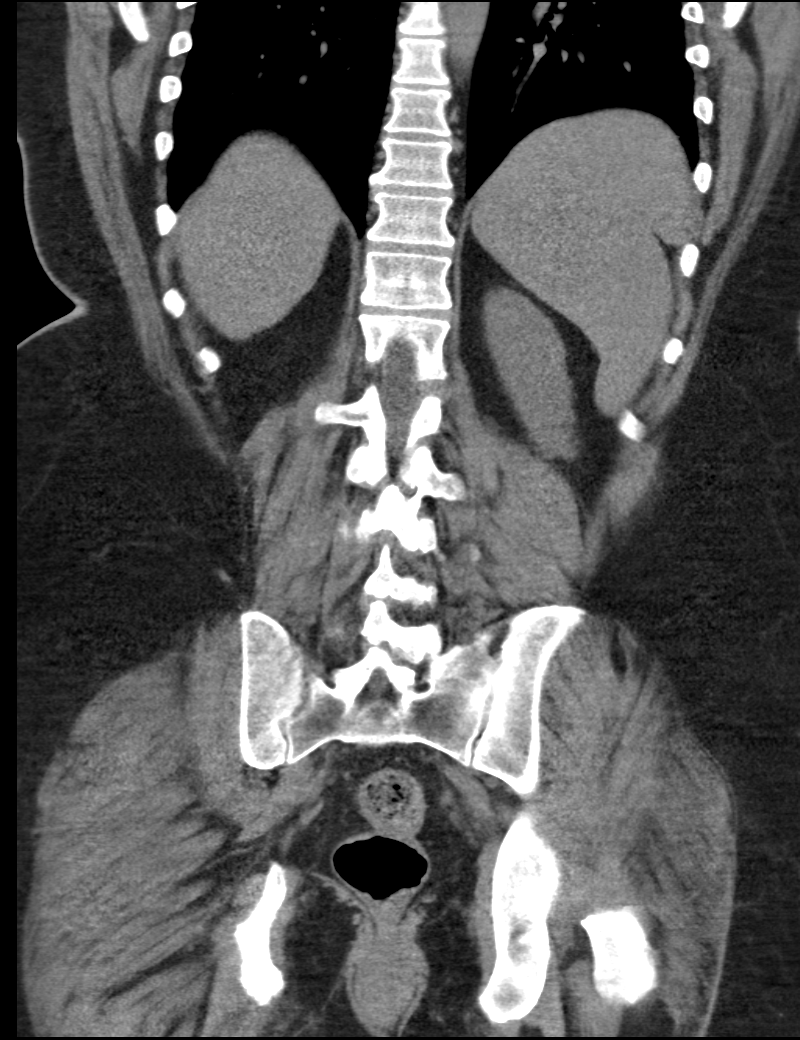

[Series 207: sagittal · sagittal · 0.50mm/px · 1 of 137 slices shown]
[im 46/137  soft-tissue]
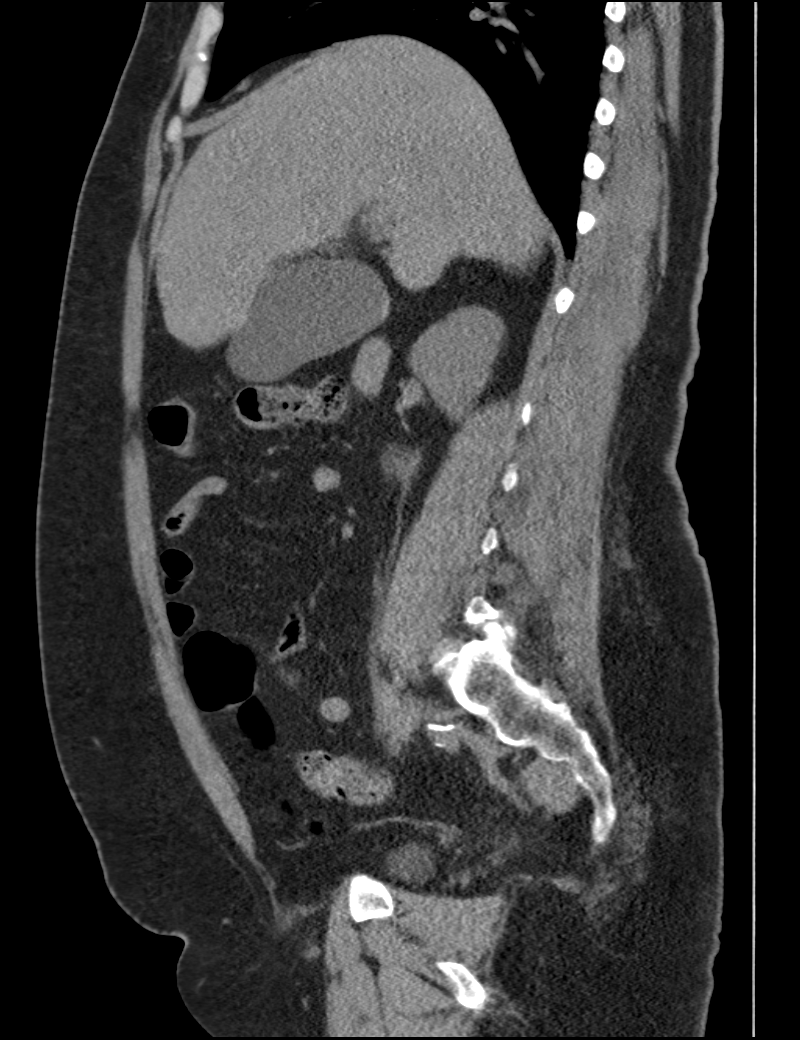

[4 of 46 positions shown; findings below may reference images not displayed]

FINDINGS: The lack of intravenous contrast limits the ability to evaluate
solid abdominal organs.

Normal noncontrast appearance of the bilateral kidneys. No renal
stones. No stones are seen along the expected course of either
ureter or the urinary bladder. Normal noncontrast appearance of the
urinary bladder given degree distention. No urinary obstruction or
perinephric stranding.

There is nodularity of the hepatic contour (representative images 35
and 38, series 201). This finding is associated with splenomegaly
with the spleen measuring approximately 16 cm in length (image 29,
series 201). No ascites. No definitive hepatic lesions on this
noncontrast examination.

There are multiple layering hyper attenuating gallstones within the
neck of an otherwise normal-appearing gallbladder (image 31, series
21) without associated gallbladder wall thickening or
pericholecystic fluid.

Note is made of an approximately 2.9 x 2.1 cm fat containing - 103
Hounsfield unit) adrenal myelolipoma within the medial limb of the
right adrenal gland (image 24). Normal noncontrast appearance of the
left adrenal gland. Normal noncontrast appearance of the pancreas.

Moderate colonic stool burden without evidence of obstruction. The
appendix is not visualized, compatible with provided surgical
history. No pneumoperitoneum, pneumatosis or portal venous gas.

Scattered atherosclerotic plaque within a normal caliber abdominal
aorta.

Shotty porta hepatis and retroperitoneal lymph nodes are
individually not enlarged by size criteria with index porta hepatis
lymph node measuring approximately 1.2 cm in greatest short axis
diameter (image 29, series 201) an index aortocaval lymph node
measuring approximately 0.9 cm (image 44). Note is also made of a
enlarged lymph node just caudal to the anterior aspect of the right
hemidiaphragm measuring approximately 0.6 cm in greatest short axis
diameter (image 9, series 201 as well as a shotty pericardiac lymph
node measuring approximately 0.9 cm (image 7), both of which are
presumably reactive given the suspected cirrhotic change within the
liver. No definite bulky retroperitoneal, mesenteric, pelvic or
inguinal lymphadenopathy on this noncontrast examination.

Normal noncontrast appearance of the pelvic organs. No free fluid in
the pelvis.

Limited visualization of lower thorax demonstrates minimal
subpleural ground-glass atelectasis within the imaged left lower
lobe. There is an approximately 1.2 x 1.1 cm tiny pneumatocele
within image right lower lobe (image 7, series 202). No discrete
focal airspace opacities. No pleural effusion.

Borderline cardiomegaly. Calcifications within the aortic valve
leaflets are suspected though incompletely imaged. No pericardial
effusion.

No acute or aggressive osseus abnormalities. Mild to moderate
multilevel lumbar spine DDD of L5-S1 with disc space height loss,
endplate irregularity and sclerosis. Regional soft tissues appear
normal.
IMPRESSION: 1. No evidence of nephrolithiasis or urinary obstruction.
2. Mild and moderate multilevel lumbar spine DDD, worst at L5-S1.
3. Cholelithiasis without evidence of cholecystitis.
4. Borderline cardiomegaly with suspected calcifications within the
aortic valve leaflets. Further evaluation of cardiac echo could be
performed as clinically indicated.
5. Incidentally noted approximately 2.9 cm benign adrenal
myelolipoma within the medial limb of the right adrenal gland.
6. Nodularity of the hepatic contour worrisome for cirrhosis with
associated splenomegaly suggestive of associated portal venous
hypertension. No evidence of ascites. Shoddy porta hepatis, infra
diaphragmatic and pericardial lymph nodes presumably reactive due to
cirrhotic change of the liver. While no discrete hepatic lesions are
identified on this noncontrast examination, further evaluation with
nonemergent contrast enhanced MRI could be performed as clinically
indicated.

## 2014-11-16 ENCOUNTER — Telehealth: Payer: Self-pay | Admitting: Family Medicine

## 2014-11-16 NOTE — Telephone Encounter (Signed)
Patient is calling to report sugar readings  11/15/14 143 11/16/14 120 He said it is averaging 119 And also would like referral to ey dr, dr Delman Cheadle if possible  [phone] if any questions

## 2014-11-17 ENCOUNTER — Telehealth: Payer: Self-pay | Admitting: *Deleted

## 2014-11-17 DIAGNOSIS — H269 Unspecified cataract: Secondary | ICD-10-CM

## 2014-11-17 NOTE — Telephone Encounter (Signed)
Wife called stating that since his blood sugars have been Excellent she wants to know if he can cut down on the the insulin and just stay on Metformin BID says his medicaid runs out end of May and insulin is going to be expensive and just wants to see how he does without it.   Would prefer Dr. Dennard Schaumann to return call at his convenience [phone]   Referral placed to Dr. Katy Fitch as requested.

## 2014-11-17 NOTE — Telephone Encounter (Signed)
Can try quitting insulin but we will need to start Glipizide xr 10 mg poqday at least with the metformin.

## 2014-11-17 NOTE — Telephone Encounter (Signed)
Make no further changes in meds at present and I am fine with referral to eye dr.

## 2014-11-17 NOTE — Telephone Encounter (Signed)
Referral placed.

## 2014-11-18 MED ORDER — GLIPIZIDE 10 MG PO TABS: 10.0000 mg | ORAL_TABLET | Freq: Every day | ORAL | Status: DC

## 2014-11-18 NOTE — Telephone Encounter (Signed)
Pt wife aware of message and change in medications, script sent to pharmacy

## 2014-12-18 ENCOUNTER — Ambulatory Visit: Payer: Medicaid Other | Admitting: Family Medicine

## 2015-01-04 ENCOUNTER — Encounter: Payer: Self-pay | Admitting: Internal Medicine

## 2015-02-02 ENCOUNTER — Other Ambulatory Visit: Payer: Self-pay | Admitting: Family Medicine

## 2015-02-03 NOTE — Telephone Encounter (Signed)
Medication refilled per protocol. 

## 2015-04-23 ENCOUNTER — Telehealth: Payer: Self-pay | Admitting: Family Medicine

## 2015-04-23 MED ORDER — MOMETASONE FUROATE 0.1 % EX OINT: TOPICAL_OINTMENT | Freq: Every day | CUTANEOUS | Status: DC

## 2015-04-23 NOTE — Telephone Encounter (Signed)
Elocon ointment applied daily

## 2015-04-23 NOTE — Telephone Encounter (Signed)
Rx to pharmacy and pt aware 

## 2015-04-23 NOTE — Telephone Encounter (Signed)
Has appt Monday for psoriasis on hands.  Wife states hands are bad.  Can he get something for the itching over the weekend before he scratches it open and makes it worse.

## 2015-04-26 ENCOUNTER — Ambulatory Visit: Payer: Self-pay | Admitting: Family Medicine

## 2015-04-27 ENCOUNTER — Encounter: Payer: Self-pay | Admitting: Family Medicine

## 2015-04-27 ENCOUNTER — Ambulatory Visit (INDEPENDENT_AMBULATORY_CARE_PROVIDER_SITE_OTHER): Payer: Self-pay | Admitting: Family Medicine

## 2015-04-27 VITALS — BP 108/64 | HR 78 | Temp 98.1°F | Resp 18 | Ht 72.0 in | Wt 274.0 lb

## 2015-04-27 DIAGNOSIS — L309 Dermatitis, unspecified: Secondary | ICD-10-CM

## 2015-04-27 MED ORDER — OXYCODONE HCL 10 MG PO TABS: 10.0000 mg | ORAL_TABLET | Freq: Four times a day (QID) | ORAL | Status: DC

## 2015-04-27 NOTE — Progress Notes (Signed)
Subjective:    Patient ID: Ricky Rios, male    DOB: [birth year], 64 y.o.   MRN: [id number]  HPI  Patient has a rash on both hands. He has desquamation of both hands and hyperkeartosis with flaking skin.  The skin underlying is pink and irritated. There are fissures and cracks in the skin overlying the knuckles. There is lichenification of the skin on the dorsums of the hands. There is also a similar rash on the dorsum of the right forearm. To me it appears to be more eczema and dyshidrotic eczema rather than psoriasis. Past Medical History  Diagnosis Date  . Obesity   . Sleep apnea   . Chest pain     Diagnostic cardiac cath October 2012. Normal coronary arteries, left ventricular function  . Right knee injury   . Diabetes mellitus   . Hypertension   . Smoker   . HLD (hyperlipidemia) 12/09/2012  . History of back surgery   . History of neck surgery   . DDD (degenerative disc disease), lumbar    Past Surgical History  Procedure Laterality Date  . Cardiac catheterization  Oct 2012    nml coronary arteries, aorta, LV fcn  . Back surgery      for ruptured disc lumbar area  . Neck surgery      for ruptured disc  . Appendectomy    . Tonsillectomy    . I&d extremity Right 02/06/2013    Procedure: IRRIGATION AND DEBRIDEMENT INDEX FINGER;  Surgeon: Tennis Must, MD;  Location: [facility];  Service: Orthopedics;  Laterality: Right;   Current Outpatient Prescriptions on File Prior to Visit  Medication Sig Dispense Refill  . ACCU-CHEK SOFTCLIX LANCETS lancets   3  . albuterol (PROVENTIL HFA;VENTOLIN HFA) 108 (90 BASE) MCG/ACT inhaler Inhale 2 puffs into the lungs every 4 (four) hours as needed for wheezing or shortness of breath. 1 Inhaler 2  . Blood Glucose Monitoring Suppl (ACCU-CHEK AVIVA PLUS) W/DEVICE KIT As directed - DX: E11.9 1 kit 0  . furosemide (LASIX) 40 MG tablet Take 1 tablet (40 mg total) by mouth daily. 30 tablet 3  . glipiZIDE (GLUCOTROL) 10 MG tablet TAKE 1 TABLET BY  MOUTH DAILY BEFORE BREAKFAST. 90 tablet 0  . glucose blood test strip Check BS BID - DX: E11.9 100 each 12  . Insulin Glargine 300 UNIT/ML SOPN Inject 63 Units into the skin daily.     . Insulin Pen Needle 31G X 8 MM MISC To inject QD for Insulin - 100 each 3  . Lancet Devices (ACCU-CHEK SOFTCLIX) lancets Use as instructed for BID BS check DX: E11.9 100 each 3  . metFORMIN (GLUCOPHAGE) 1000 MG tablet Take 1 tablet (1,000 mg total) by mouth 2 (two) times daily with a meal. 180 tablet 3  . mometasone (ELOCON) 0.1 % ointment Apply topically daily. 45 g 0   No current facility-administered medications on file prior to visit.   Allergies  Allergen Reactions  . Lipitor [Atorvastatin Calcium] Other (See Comments)    shaking  . Sulfa Antibiotics Other (See Comments)    unknown  . Zocor [Simvastatin - High Dose] Other (See Comments)    shaking   Social History   Social History  . Marital Status: Married    Spouse Name: N/A  . Number of Children: 2  . Years of Education: N/A   Occupational History  . Disabled    Social History Main Topics  . Smoking status: Current Every Day  Smoker -- 2.00 packs/day    Types: Cigarettes  . Smokeless tobacco: Never Used  . Alcohol Use: No  . Drug Use: No  . Sexual Activity: Not on file   Other Topics Concern  . Not on file   Social History Narrative      Review of Systems  All other systems reviewed and are negative.      Objective:   Physical Exam  Cardiovascular: Normal rate, regular rhythm and normal heart sounds.   Pulmonary/Chest: Effort normal and breath sounds normal.  Skin: Rash noted. There is erythema.  Vitals reviewed.         Assessment & Plan:  Eczema of both hands  Use Elocon ointment twice daily to the rash on the hands. At night apply the ointment and then occlude the hands with gloves.  use this for approximately 2 weeks or until well. Then switch to petroleum jelly as a preventative. Recheck in 2 weeks if no  better. I like the patient to return fasting in the morning for a CMP, fasting lipid panel, hemoglobin A1c

## 2015-04-28 ENCOUNTER — Other Ambulatory Visit: Payer: Self-pay

## 2015-04-28 DIAGNOSIS — E785 Hyperlipidemia, unspecified: Secondary | ICD-10-CM

## 2015-04-28 DIAGNOSIS — I10 Essential (primary) hypertension: Secondary | ICD-10-CM

## 2015-04-28 DIAGNOSIS — Z79899 Other long term (current) drug therapy: Secondary | ICD-10-CM

## 2015-04-28 DIAGNOSIS — E119 Type 2 diabetes mellitus without complications: Secondary | ICD-10-CM

## 2015-04-28 LAB — COMPREHENSIVE METABOLIC PANEL
ALK PHOS: 87 U/L (ref 40–115)
ALT: 40 U/L (ref 9–46)
AST: 51 U/L — AB (ref 10–35)
Albumin: 3.9 g/dL (ref 3.6–5.1)
BILIRUBIN TOTAL: 0.5 mg/dL (ref 0.2–1.2)
BUN: 13 mg/dL (ref 7–25)
CO2: 26 mmol/L (ref 20–31)
CREATININE: 0.91 mg/dL (ref 0.70–1.25)
Calcium: 9.3 mg/dL (ref 8.6–10.3)
Chloride: 104 mmol/L (ref 98–110)
GLUCOSE: 139 mg/dL — AB (ref 70–99)
Potassium: 4.6 mmol/L (ref 3.5–5.3)
Sodium: 139 mmol/L (ref 135–146)
TOTAL PROTEIN: 7.2 g/dL (ref 6.1–8.1)

## 2015-04-28 LAB — LIPID PANEL
CHOLESTEROL: 223 mg/dL — AB (ref 125–200)
HDL: 28 mg/dL — ABNORMAL LOW (ref >=40)
LDL Cholesterol: 145 mg/dL — ABNORMAL HIGH (ref <130)
Total CHOL/HDL Ratio: 8 Ratio — ABNORMAL HIGH (ref <=5.0)
Triglycerides: 252 mg/dL — ABNORMAL HIGH (ref <150)
VLDL: 50 mg/dL — ABNORMAL HIGH (ref <30)

## 2015-04-29 LAB — CBC WITH DIFFERENTIAL/PLATELET
BASOS ABS: 0.1 10*3/uL (ref 0.0–0.1)
Basophils Relative: 1 % (ref 0–1)
EOS PCT: 5 % (ref 0–5)
Eosinophils Absolute: 0.4 10*3/uL (ref 0.0–0.7)
HCT: 42.4 % (ref 39.0–52.0)
Hemoglobin: 14.1 g/dL (ref 13.0–17.0)
LYMPHS ABS: 3.5 10*3/uL (ref 0.7–4.0)
Lymphocytes Relative: 41 % (ref 12–46)
MCH: 29.5 pg (ref 26.0–34.0)
MCHC: 33.3 g/dL (ref 30.0–36.0)
MCV: 88.7 fL (ref 78.0–100.0)
MPV: 10.7 fL (ref 8.6–12.4)
Monocytes Absolute: 0.8 10*3/uL (ref 0.1–1.0)
Monocytes Relative: 9 % (ref 3–12)
NEUTROS PCT: 44 % (ref 43–77)
Neutro Abs: 3.8 10*3/uL (ref 1.7–7.7)
PLATELETS: 135 10*3/uL — AB (ref 150–400)
RBC: 4.78 MIL/uL (ref 4.22–5.81)
RDW: 15.4 % (ref 11.5–15.5)
WBC: 8.6 10*3/uL (ref 4.0–10.5)

## 2015-04-29 LAB — HEMOGLOBIN A1C
HEMOGLOBIN A1C: 6.7 % — AB (ref <5.7)
MEAN PLASMA GLUCOSE: 146 mg/dL — AB (ref <117)

## 2015-04-30 ENCOUNTER — Other Ambulatory Visit: Payer: Self-pay | Admitting: Family Medicine

## 2015-04-30 MED ORDER — PRAVASTATIN SODIUM 40 MG PO TABS: 40.0000 mg | ORAL_TABLET | Freq: Every day | ORAL | Status: DC

## 2015-05-17 ENCOUNTER — Telehealth: Payer: Self-pay | Admitting: Family Medicine

## 2015-05-17 DIAGNOSIS — L309 Dermatitis, unspecified: Secondary | ICD-10-CM

## 2015-05-17 NOTE — Telephone Encounter (Signed)
Pt's wife is calling to let Dr. Dennard Schaumann know that hi skin condition has not gotten any better and that he would like for [place] to call in a prescription for Prednisone to the [facility] on [address]. She would also like for him to have an appt set up to see a Dermatologist. [phone]

## 2015-05-18 MED ORDER — PREDNISONE 20 MG PO TABS: ORAL_TABLET | ORAL | Status: DC

## 2015-05-18 NOTE — Telephone Encounter (Signed)
Prednisone taper pack and derm consult.

## 2015-05-18 NOTE — Telephone Encounter (Signed)
Med sent to pharm - tried to call pt and line busy - will place referral

## 2015-10-20 ENCOUNTER — Encounter: Payer: Self-pay | Admitting: Family Medicine

## 2016-02-10 ENCOUNTER — Ambulatory Visit: Payer: Self-pay | Admitting: Family Medicine

## 2016-02-24 ENCOUNTER — Ambulatory Visit: Payer: Self-pay | Admitting: Family Medicine

## 2016-03-31 ENCOUNTER — Telehealth: Payer: Self-pay | Admitting: Family Medicine

## 2016-03-31 ENCOUNTER — Ambulatory Visit (INDEPENDENT_AMBULATORY_CARE_PROVIDER_SITE_OTHER): Payer: Self-pay | Admitting: Family Medicine

## 2016-03-31 ENCOUNTER — Encounter: Payer: Self-pay | Admitting: Family Medicine

## 2016-03-31 VITALS — BP 156/80 | HR 80 | Temp 97.8°F | Resp 18 | Ht 72.0 in | Wt 255.0 lb

## 2016-03-31 DIAGNOSIS — Z794 Long term (current) use of insulin: Secondary | ICD-10-CM

## 2016-03-31 DIAGNOSIS — E119 Type 2 diabetes mellitus without complications: Secondary | ICD-10-CM

## 2016-03-31 DIAGNOSIS — L02811 Cutaneous abscess of head [any part, except face]: Secondary | ICD-10-CM

## 2016-03-31 DIAGNOSIS — IMO0001 Reserved for inherently not codable concepts without codable children: Secondary | ICD-10-CM

## 2016-03-31 LAB — CBC WITH DIFFERENTIAL/PLATELET
BASOS ABS: 90 {cells}/uL (ref 0–200)
Basophils Relative: 1 %
EOS PCT: 5 %
Eosinophils Absolute: 450 cells/uL (ref 15–500)
HCT: 45.5 % (ref 38.5–50.0)
HEMOGLOBIN: 15.2 g/dL (ref 13.0–17.0)
LYMPHS ABS: 2970 {cells}/uL (ref 850–3900)
Lymphocytes Relative: 33 %
MCH: 29.9 pg (ref 27.0–33.0)
MCHC: 33.4 g/dL (ref 32.0–36.0)
MCV: 89.4 fL (ref 80.0–100.0)
MPV: 11.4 fL (ref 7.5–12.5)
Monocytes Absolute: 540 cells/uL (ref 200–950)
Monocytes Relative: 6 %
NEUTROS PCT: 55 %
Neutro Abs: 4950 cells/uL (ref 1500–7800)
Platelets: 131 10*3/uL — ABNORMAL LOW (ref 140–400)
RBC: 5.09 MIL/uL (ref 4.20–5.80)
RDW: 14.4 % (ref 11.0–15.0)
WBC: 9 10*3/uL (ref 3.8–10.8)

## 2016-03-31 MED ORDER — DOXYCYCLINE HYCLATE 100 MG PO TABS: 100.0000 mg | ORAL_TABLET | Freq: Two times a day (BID) | ORAL | 0 refills | Status: DC

## 2016-03-31 NOTE — Telephone Encounter (Signed)
Patient's wife calling requesting something called in for pain per patient. They left a message stating patient was in pain from the procedure he had done today.  CB# [phone]

## 2016-03-31 NOTE — Progress Notes (Addendum)
Subjective:    Patient ID: Ricky Rios, male    DOB: 08/10/1950, 65 y.o.   MRN: [id number]  HPI Not seen the patient in a long time. He is not checking his sugars. He states that his sugars are out of control. He has stopped all of his medication.  He reports polyuria. He reports a large painful mass on the posterior left side of his neck. He has a 2.5" x 1.5" red hot fluctuant abscess on his neck. This is been present for more than a week. It is extremely tender to palpation. He also reports subjective fevers. Past Medical History:  Diagnosis Date  . Chest pain    Diagnostic cardiac cath October 2012. Normal coronary arteries, left ventricular function  . DDD (degenerative disc disease), lumbar   . Diabetes mellitus   . History of back surgery   . History of neck surgery   . HLD (hyperlipidemia) 12/09/2012  . Hypertension   . Obesity   . Right knee injury   . Sleep apnea   . Smoker    Past Surgical History:  Procedure Laterality Date  . APPENDECTOMY    . BACK SURGERY     for ruptured disc lumbar area  . CARDIAC CATHETERIZATION  Oct 2012   nml coronary arteries, aorta, LV fcn  . I&D EXTREMITY Right 02/06/2013   Procedure: IRRIGATION AND DEBRIDEMENT INDEX FINGER;  Surgeon: Tennis Must, MD;  Location: [facility];  Service: Orthopedics;  Laterality: Right;  . NECK SURGERY     for ruptured disc  . TONSILLECTOMY     Current Outpatient Prescriptions on File Prior to Visit  Medication Sig Dispense Refill  . ACCU-CHEK SOFTCLIX LANCETS lancets   3  . albuterol (PROVENTIL HFA;VENTOLIN HFA) 108 (90 BASE) MCG/ACT inhaler Inhale 2 puffs into the lungs every 4 (four) hours as needed for wheezing or shortness of breath. (Patient not taking: Reported on 03/31/2016) 1 Inhaler 2  . Blood Glucose Monitoring Suppl (ACCU-CHEK AVIVA PLUS) W/DEVICE KIT As directed - DX: E11.9 (Patient not taking: Reported on 03/31/2016) 1 kit 0  . furosemide (LASIX) 40 MG tablet Take 1 tablet (40 mg total) by mouth  daily. (Patient not taking: Reported on 03/31/2016) 30 tablet 3  . glipiZIDE (GLUCOTROL) 10 MG tablet TAKE 1 TABLET BY MOUTH DAILY BEFORE BREAKFAST. (Patient not taking: Reported on 03/31/2016) 90 tablet 0  . glucose blood test strip Check BS BID - DX: E11.9 (Patient not taking: Reported on 03/31/2016) 100 each 12  . Insulin Glargine 300 UNIT/ML SOPN Inject 63 Units into the skin daily.     . Insulin Pen Needle 31G X 8 MM MISC To inject QD for Insulin - (Patient not taking: Reported on 03/31/2016) 100 each 3  . Lancet Devices (ACCU-CHEK SOFTCLIX) lancets Use as instructed for BID BS check DX: E11.9 (Patient not taking: Reported on 03/31/2016) 100 each 3  . metFORMIN (GLUCOPHAGE) 1000 MG tablet Take 1 tablet (1,000 mg total) by mouth 2 (two) times daily with a meal. (Patient not taking: Reported on 03/31/2016) 180 tablet 3  . mometasone (ELOCON) 0.1 % ointment Apply topically daily. (Patient not taking: Reported on 03/31/2016) 45 g 0  . Oxycodone HCl 10 MG TABS Take 1 tablet (10 mg total) by mouth every 6 (six) hours. (Patient not taking: Reported on 03/31/2016) 60 tablet 0  . pravastatin (PRAVACHOL) 40 MG tablet Take 1 tablet (40 mg total) by mouth daily. (Patient not taking: Reported on 03/31/2016) 90 tablet 1  .  predniSONE (DELTASONE) 20 MG tablet 3 tabs po qd x 2 days, 2 tabs po qd x 2 days, 1 tab po qd x 2 days (Patient not taking: Reported on 03/31/2016) 12 tablet 0   No current facility-administered medications on file prior to visit.    Allergies  Allergen Reactions  . Lipitor [Atorvastatin Calcium] Other (See Comments)    shaking  . Sulfa Antibiotics Other (See Comments)    unknown  . Zocor [Simvastatin - High Dose] Other (See Comments)    shaking   Social History   Social History  . Marital status: Married    Spouse name: N/A  . Number of children: 2  . Years of education: N/A   Occupational History  . Disabled Psychologist, occupational   Social History Main Topics  . Smoking status: Current Every  Day Smoker    Packs/day: 2.00    Types: Cigarettes  . Smokeless tobacco: Never Used  . Alcohol use No  . Drug use: No  . Sexual activity: Not on file   Other Topics Concern  . Not on file   Social History Narrative  . No narrative on file     Review of Systems  All other systems reviewed and are negative.      Objective:   Physical Exam  Cardiovascular: Normal rate, regular rhythm and normal heart sounds.   Pulmonary/Chest: Effort normal and breath sounds normal.  Abdominal: Soft. Bowel sounds are normal.  Skin: Skin is warm. There is erythema.          Assessment & Plan:  Abscess of head, except face - Plan: CULTURE, ROUTINE-ABSCESS, doxycycline (VIBRA-TABS) 100 MG tablet  IDDM (insulin dependent diabetes mellitus) ([place]) - Plan: CBC with Differential/Platelet, COMPLETE METABOLIC PANEL WITH GFR, Hemoglobin A1c  I anesthetized the lesion on the left side posterior aspect of his neck was 0.1% lidocaine. I then made a 1 cm vertical incision. I expressed copious quantities of yellow pus. A wound culture was sent. The wound cavity was then probed with Q-tips soaked in hydrogen peroxide. The abscess cavity was then packed with 10 cm of one quarter-inch iodoform gauze. Wound care was discussed. Begin doxycycline 100 mg by mouth twice a day for 10 days. Recheck on Monday. Obtain lab work including a CBC, CMP, and hemoglobin A1c to determine his renal function and his glycemic control. Discussed further on Monday however abscess takes precedence.  Begin Triseba 30 units sq qday

## 2016-04-01 ENCOUNTER — Emergency Department (HOSPITAL_COMMUNITY)
Admission: EM | Admit: 2016-04-01 | Discharge: 2016-04-01 | Disposition: A | Discharge status: Home / Self Care | Payer: Medicaid Other | Attending: Emergency Medicine | Admitting: Emergency Medicine

## 2016-04-01 ENCOUNTER — Telehealth: Payer: Self-pay | Admitting: Family Medicine

## 2016-04-01 ENCOUNTER — Encounter (HOSPITAL_COMMUNITY): Payer: Self-pay | Admitting: Emergency Medicine

## 2016-04-01 DIAGNOSIS — Z4801 Encounter for change or removal of surgical wound dressing: Secondary | ICD-10-CM | POA: Insufficient documentation

## 2016-04-01 DIAGNOSIS — I10 Essential (primary) hypertension: Secondary | ICD-10-CM | POA: Insufficient documentation

## 2016-04-01 DIAGNOSIS — Z794 Long term (current) use of insulin: Secondary | ICD-10-CM | POA: Insufficient documentation

## 2016-04-01 DIAGNOSIS — Z79899 Other long term (current) drug therapy: Secondary | ICD-10-CM | POA: Insufficient documentation

## 2016-04-01 DIAGNOSIS — E119 Type 2 diabetes mellitus without complications: Secondary | ICD-10-CM | POA: Insufficient documentation

## 2016-04-01 DIAGNOSIS — Z5189 Encounter for other specified aftercare: Secondary | ICD-10-CM

## 2016-04-01 DIAGNOSIS — F17210 Nicotine dependence, cigarettes, uncomplicated: Secondary | ICD-10-CM | POA: Insufficient documentation

## 2016-04-01 DIAGNOSIS — Z7984 Long term (current) use of oral hypoglycemic drugs: Secondary | ICD-10-CM | POA: Insufficient documentation

## 2016-04-01 LAB — COMPLETE METABOLIC PANEL WITH GFR
ALBUMIN: 3.7 g/dL (ref 3.6–5.1)
ALK PHOS: 117 U/L — AB (ref 40–115)
ALT: 37 U/L (ref 9–46)
AST: 42 U/L — ABNORMAL HIGH (ref 10–35)
BUN: 8 mg/dL (ref 7–25)
CALCIUM: 9.9 mg/dL (ref 8.6–10.3)
CO2: 22 mmol/L (ref 20–31)
Chloride: 99 mmol/L (ref 98–110)
Creat: 0.87 mg/dL (ref 0.70–1.25)
GLUCOSE: 380 mg/dL — AB (ref 70–99)
POTASSIUM: 4.2 mmol/L (ref 3.5–5.3)
SODIUM: 134 mmol/L — AB (ref 135–146)
Total Bilirubin: 0.7 mg/dL (ref 0.2–1.2)
Total Protein: 7.4 g/dL (ref 6.1–8.1)

## 2016-04-01 LAB — HEMOGLOBIN A1C
HEMOGLOBIN A1C: 11.9 % — AB (ref <5.7)
MEAN PLASMA GLUCOSE: 295 mg/dL

## 2016-04-01 MED ORDER — TRAMADOL HCL 50 MG PO TABS: 50.0000 mg | ORAL_TABLET | Freq: Four times a day (QID) | ORAL | 0 refills | Status: DC | PRN

## 2016-04-01 MED ORDER — OXYCODONE-ACETAMINOPHEN 5-325 MG PO TABS
1.0000 | ORAL_TABLET | Freq: Once | ORAL | Status: AC
  Administered 2016-04-01: 1 via ORAL
  Filled 2016-04-01: qty 1

## 2016-04-01 NOTE — Telephone Encounter (Signed)
Pt wife called, seen in office yesterday with abscess and uncontrolled DM. Had I& D performed and packing, he can not reach area, has increased pain, no fever, wife has visual impairment can not see, Could not afford UC, with is A1C at 11 as he stopped his meds, worsening pain and infection advised to go to ER for wound care and evaluation as no one in the home can assist him. Wife voiced understanding.

## 2016-04-01 NOTE — ED Triage Notes (Signed)
Wife stated, he has a wound packing in the back of his neck/ear for over a week and it was lanced and packed yesterday by Dr. Earl Lagos.  Today we couldn't get any packing to cut some. Dr. Rockey Situ [place] to come here.

## 2016-04-01 NOTE — ED Provider Notes (Signed)
[facility] DEPT Provider Note   CSN: [id number] Arrival date & time: 04/01/16  1335    By signing my name below, I, Sonum Patel, attest that this documentation has been prepared under the direction and in the presence of Virgel Manifold, MD. Electronically Signed: Sonum Patel, Education administrator. 04/01/16. 3:52 PM.  History   Chief Complaint Chief Complaint  Patient presents with  . Neck Pain  . Wound Infection    The history is provided by the patient and the spouse. No language interpreter was used.     HPI Comments: Ricky Rios is a 65 y.o. male who presents to the Emergency Department complaining of a wound to the left neck/behind left ear that has been present for over 1 week. Patient was seen by Dr. Dennard Schaumann who lanced and packed the wound yesterday. He states patient was advised to remove the packing at home but he and wife were unable. He reports associated pain to the affected area. He was started on doxycycline 100 mg BID for 10 days which he has been compliant with. He has a history of DM.    Past Medical History:  Diagnosis Date  . Chest pain    Diagnostic cardiac cath October 2012. Normal coronary arteries, left ventricular function  . DDD (degenerative disc disease), lumbar   . Diabetes mellitus   . History of back surgery   . History of neck surgery   . HLD (hyperlipidemia) 12/09/2012  . Hypertension   . Obesity   . Right knee injury   . Sleep apnea   . Smoker     Patient Active Problem List   Diagnosis Date Noted  . DDD (degenerative disc disease), lumbar   . HLD (hyperlipidemia) 12/09/2012  . Diabetes mellitus   . Hypertension   . Smoker     Past Surgical History:  Procedure Laterality Date  . APPENDECTOMY    . BACK SURGERY     for ruptured disc lumbar area  . CARDIAC CATHETERIZATION  Oct 2012   nml coronary arteries, aorta, LV fcn  . I&D EXTREMITY Right 02/06/2013   Procedure: IRRIGATION AND DEBRIDEMENT INDEX FINGER;  Surgeon: Tennis Must, MD;   Location: [facility];  Service: Orthopedics;  Laterality: Right;  . NECK SURGERY     for ruptured disc  . TONSILLECTOMY         Home Medications    Prior to Admission medications   Medication Sig Start Date End Date Taking? Authorizing Provider  ACCU-CHEK SOFTCLIX LANCETS lancets  10/19/14   Historical Provider, MD  albuterol (PROVENTIL HFA;VENTOLIN HFA) 108 (90 BASE) MCG/ACT inhaler Inhale 2 puffs into the lungs every 4 (four) hours as needed for wheezing or shortness of breath. Patient not taking: Reported on 03/31/2016 09/25/14   Alycia Rossetti, MD  Blood Glucose Monitoring Suppl (ACCU-CHEK AVIVA PLUS) W/DEVICE KIT As directed - DX: E11.9 Patient not taking: Reported on 03/31/2016 10/19/14   Susy Frizzle, MD  doxycycline (VIBRA-TABS) 100 MG tablet Take 1 tablet (100 mg total) by mouth 2 (two) times daily. 03/31/16   Susy Frizzle, MD  furosemide (LASIX) 40 MG tablet Take 1 tablet (40 mg total) by mouth daily. Patient not taking: Reported on 03/31/2016 07/07/14   Susy Frizzle, MD  glipiZIDE (GLUCOTROL) 10 MG tablet TAKE 1 TABLET BY MOUTH DAILY BEFORE BREAKFAST. Patient not taking: Reported on 03/31/2016 02/03/15   Susy Frizzle, MD  glucose blood test strip Check BS BID - DX: E11.9 Patient not taking:  Reported on 03/31/2016 10/19/14   Susy Frizzle, MD  Insulin Glargine 300 UNIT/ML SOPN Inject 63 Units into the skin daily.     Historical Provider, MD  Insulin Pen Needle 31G X 8 MM MISC To inject QD for Insulin - Patient not taking: Reported on 03/31/2016 10/19/14   Susy Frizzle, MD  Lancet Devices [facility]) lancets Use as instructed for BID BS check DX: E11.9 Patient not taking: Reported on 03/31/2016 10/19/14   Susy Frizzle, MD  metFORMIN (GLUCOPHAGE) 1000 MG tablet Take 1 tablet (1,000 mg total) by mouth 2 (two) times daily with a meal. Patient not taking: Reported on 03/31/2016 11/03/14   Susy Frizzle, MD  mometasone (ELOCON) 0.1 % ointment Apply topically  daily. Patient not taking: Reported on 03/31/2016 04/23/15   Susy Frizzle, MD  Oxycodone HCl 10 MG TABS Take 1 tablet (10 mg total) by mouth every 6 (six) hours. Patient not taking: Reported on 03/31/2016 04/27/15   Susy Frizzle, MD  pravastatin (PRAVACHOL) 40 MG tablet Take 1 tablet (40 mg total) by mouth daily. Patient not taking: Reported on 03/31/2016 04/30/15   Susy Frizzle, MD  predniSONE (DELTASONE) 20 MG tablet 3 tabs po qd x 2 days, 2 tabs po qd x 2 days, 1 tab po qd x 2 days Patient not taking: Reported on 03/31/2016 05/18/15   Susy Frizzle, MD    Family History Family History  Problem Relation Age of Onset  . Dementia Mother   . Heart disease Father     Social History Social History  Substance Use Topics  . Smoking status: Current Every Day Smoker    Packs/day: 2.00    Types: Cigarettes  . Smokeless tobacco: Never Used  . Alcohol use No     Allergies   Lipitor [atorvastatin calcium]; Sulfa antibiotics; and Zocor [simvastatin - high dose]   Review of Systems Review of Systems  Constitutional: Negative for fever.  Skin: Positive for wound.  All other systems reviewed and are negative.    Physical Exam Updated Vital Signs BP 129/78   Pulse 64   Temp 97.4 F (36.3 C) (Oral)   Resp 18   Ht 6' (1.829 m)   Wt 255 lb (115.7 kg)   SpO2 97%   BMI 34.58 kg/m   Physical Exam  Constitutional: He is oriented to person, place, and time. He appears well-developed and well-nourished.  HENT:  Head: Normocephalic and atraumatic.  Eyes: EOM are normal.  Neck: Normal range of motion.  Cardiovascular: Normal rate, regular rhythm, normal heart sounds and intact distal pulses.   Pulmonary/Chest: Effort normal and breath sounds normal. No respiratory distress.  Abdominal: Soft. He exhibits no distension. There is no tenderness.  Musculoskeletal: Normal range of motion.  Neurological: He is alert and oriented to person, place, and time.  Skin: Skin is warm and  dry.  Abscess along the hair line, posteriorly on the left side. Packing removed. No significant drainage. Appears to be healing appropriately post procedure.   Psychiatric: He has a normal mood and affect. Judgment normal.  Nursing note and vitals reviewed.    ED Treatments / Results  DIAGNOSTIC STUDIES: Oxygen Saturation is 99% on RA, normal by my interpretation.    COORDINATION OF CARE: 3:52 PM Advised to continue taking the doxycycline and wash the area with warm soapy water. Discussed treatment plan with pt at bedside and pt agreed to plan.    Labs (all labs ordered are  listed, but only abnormal results are displayed) Labs Reviewed - No data to display  EKG  EKG Interpretation None       Radiology No results found.  Procedures Procedures (including critical care time)  Medications Ordered in ED Medications - No data to display   Initial Impression / Assessment and Plan / ED Course  I have reviewed the triage vital signs and the nursing notes.  Pertinent labs & imaging results that were available during my care of the patient were reviewed by me and considered in my medical decision making (see chart for details).  Clinical Course    Wound appears to be healing appropriately. Good I&D. Packing removed. No additional drainage noted. Continued abx. Continued wound care and return precautions discussed.   Final Clinical Impressions(s) / ED Diagnoses   Final diagnoses:  Wound check, abscess    New Prescriptions New Prescriptions   No medications on file    I personally preformed the services scribed in my presence. The recorded information has been reviewed is accurate. Virgel Manifold, MD.    Virgel Manifold, MD 04/05/16 1455

## 2016-04-03 ENCOUNTER — Encounter: Payer: Self-pay | Admitting: Family Medicine

## 2016-04-03 ENCOUNTER — Ambulatory Visit (INDEPENDENT_AMBULATORY_CARE_PROVIDER_SITE_OTHER): Payer: Self-pay | Admitting: Family Medicine

## 2016-04-03 VITALS — BP 100/60 | HR 68 | Temp 97.9°F | Ht 72.0 in

## 2016-04-03 DIAGNOSIS — E119 Type 2 diabetes mellitus without complications: Secondary | ICD-10-CM

## 2016-04-03 DIAGNOSIS — IMO0001 Reserved for inherently not codable concepts without codable children: Secondary | ICD-10-CM

## 2016-04-03 DIAGNOSIS — Z794 Long term (current) use of insulin: Secondary | ICD-10-CM

## 2016-04-03 DIAGNOSIS — E1311 Other specified diabetes mellitus with ketoacidosis with coma: Secondary | ICD-10-CM

## 2016-04-03 DIAGNOSIS — L02811 Cutaneous abscess of head [any part, except face]: Secondary | ICD-10-CM

## 2016-04-03 LAB — CULTURE, ROUTINE-ABSCESS

## 2016-04-03 MED ORDER — OXYCODONE-ACETAMINOPHEN 5-325 MG PO TABS: 1.0000 | ORAL_TABLET | Freq: Four times a day (QID) | ORAL | 0 refills | Status: DC | PRN

## 2016-04-03 MED ORDER — GLIPIZIDE ER 10 MG PO TB24: 10.0000 mg | ORAL_TABLET | Freq: Every day | ORAL | 3 refills | Status: DC

## 2016-04-03 MED ORDER — METFORMIN HCL 1000 MG PO TABS
1000.0000 mg | ORAL_TABLET | Freq: Two times a day (BID) | ORAL | 3 refills | Status: DC
Start: 2016-04-03 — End: 2017-06-28

## 2016-04-03 MED ORDER — METFORMIN HCL 1000 MG PO TABS: 1000.0000 mg | ORAL_TABLET | Freq: Two times a day (BID) | ORAL | 3 refills | Status: DC

## 2016-04-03 NOTE — Telephone Encounter (Signed)
Can have 20 percocet 5/325 1 q 6 hrs prn pain

## 2016-04-03 NOTE — Telephone Encounter (Signed)
Pt seen in [facility] today and states that he does not need the pain med now. Rx shredded.

## 2016-04-03 NOTE — Telephone Encounter (Signed)
Rx printed and left up front - Tried to call pt no answer and mailbox has not been set up yet

## 2016-04-03 NOTE — Progress Notes (Signed)
Subjective:    Patient ID: Ricky Rios, male    DOB: 01-31-1951, 65 y.o.   MRN: [id number]  HPI  03/31/16 Not seen the patient in a long time. He is not checking his sugars. He states that his sugars are out of control. He has stopped all of his medication.  He reports polyuria. He reports a large painful mass on the posterior left side of his neck. He has a 2.5" x 1.5" red hot fluctuant abscess on his neck. This is been present for more than a week. It is extremely tender to palpation. He also reports subjective fevers.  At that time, my plan was:  I anesthetized the lesion on the left side posterior aspect of his neck was 0.1% lidocaine. I then made a 1 cm vertical incision. I expressed copious quantities of yellow pus. A wound culture was sent. The wound cavity was then probed with Q-tips soaked in hydrogen peroxide. The abscess cavity was then packed with 10 cm of one quarter-inch iodoform gauze. Wound care was discussed. Begin doxycycline 100 mg by mouth twice a day for 10 days. Recheck on Monday. Obtain lab work including a CBC, CMP, and hemoglobin A1c to determine his renal function and his glycemic control. Discussed further on Monday however abscess takes precedence..  Start triseba 30 units a day  04/03/16 Over the weekend, the patient went to the emergency room where they removed the packing from his neck. He was given tramadol in the emergency room. Wound culture revealed staph aureus that was sensitive to doxycycline. Erythema has improved dramatically. His fasting blood sugar this morning was 95 and the patient felt like he was hypoglycemic. He is going to be unable to afford insulin as he has no insurance. He wants to try pills again. Past Medical History:  Diagnosis Date  . Chest pain    Diagnostic cardiac cath October 2012. Normal coronary arteries, left ventricular function  . DDD (degenerative disc disease), lumbar   . Diabetes mellitus   . History of back surgery   .  History of neck surgery   . HLD (hyperlipidemia) 12/09/2012  . Hypertension   . Obesity   . Right knee injury   . Sleep apnea   . Smoker    Past Surgical History:  Procedure Laterality Date  . APPENDECTOMY    . BACK SURGERY     for ruptured disc lumbar area  . CARDIAC CATHETERIZATION  Oct 2012   nml coronary arteries, aorta, LV fcn  . I&D EXTREMITY Right 02/06/2013   Procedure: IRRIGATION AND DEBRIDEMENT INDEX FINGER;  Surgeon: Tennis Must, MD;  Location: [place];  Service: Orthopedics;  Laterality: Right;  . NECK SURGERY     for ruptured disc  . TONSILLECTOMY     Current Outpatient Prescriptions on File Prior to Visit  Medication Sig Dispense Refill  . ACCU-CHEK SOFTCLIX LANCETS lancets   3  . albuterol (PROVENTIL HFA;VENTOLIN HFA) 108 (90 BASE) MCG/ACT inhaler Inhale 2 puffs into the lungs every 4 (four) hours as needed for wheezing or shortness of breath. (Patient not taking: Reported on 03/31/2016) 1 Inhaler 2  . Blood Glucose Monitoring Suppl (ACCU-CHEK AVIVA PLUS) W/DEVICE KIT As directed - DX: E11.9 (Patient not taking: Reported on 03/31/2016) 1 kit 0  . doxycycline (VIBRA-TABS) 100 MG tablet Take 1 tablet (100 mg total) by mouth 2 (two) times daily. 20 tablet 0  . furosemide (LASIX) 40 MG tablet Take 1 tablet (40 mg total) by mouth  daily. (Patient not taking: Reported on 03/31/2016) 30 tablet 3  . glipiZIDE (GLUCOTROL) 10 MG tablet TAKE 1 TABLET BY MOUTH DAILY BEFORE BREAKFAST. (Patient not taking: Reported on 03/31/2016) 90 tablet 0  . glucose blood test strip Check BS BID - DX: E11.9 (Patient not taking: Reported on 03/31/2016) 100 each 12  . Insulin Glargine 300 UNIT/ML SOPN Inject 63 Units into the skin daily.     . Insulin Pen Needle 31G X 8 MM MISC To inject QD for Insulin - (Patient not taking: Reported on 03/31/2016) 100 each 3  . Lancet Devices (ACCU-CHEK SOFTCLIX) lancets Use as instructed for BID BS check DX: E11.9 (Patient not taking: Reported on 03/31/2016) 100 each 3  .  mometasone (ELOCON) 0.1 % ointment Apply topically daily. (Patient not taking: Reported on 03/31/2016) 45 g 0  . Oxycodone HCl 10 MG TABS Take 1 tablet (10 mg total) by mouth every 6 (six) hours. (Patient not taking: Reported on 03/31/2016) 60 tablet 0  . oxyCODONE-acetaminophen (ROXICET) 5-325 MG tablet Take 1 tablet by mouth every 6 (six) hours as needed for severe pain. 20 tablet 0  . pravastatin (PRAVACHOL) 40 MG tablet Take 1 tablet (40 mg total) by mouth daily. (Patient not taking: Reported on 03/31/2016) 90 tablet 1  . predniSONE (DELTASONE) 20 MG tablet 3 tabs po qd x 2 days, 2 tabs po qd x 2 days, 1 tab po qd x 2 days (Patient not taking: Reported on 03/31/2016) 12 tablet 0  . traMADol (ULTRAM) 50 MG tablet Take 1 tablet (50 mg total) by mouth every 6 (six) hours as needed. 6 tablet 0   No current facility-administered medications on file prior to visit.    Allergies  Allergen Reactions  . Lipitor [Atorvastatin Calcium] Other (See Comments)    shaking  . Sulfa Antibiotics Other (See Comments)    unknown  . Zocor [Simvastatin - High Dose] Other (See Comments)    shaking   Social History   Social History  . Marital status: Married    Spouse name: N/A  . Number of children: 2  . Years of education: N/A   Occupational History  . Disabled Psychologist, occupational   Social History Main Topics  . Smoking status: Current Every Day Smoker    Packs/day: 2.00    Types: Cigarettes  . Smokeless tobacco: Never Used  . Alcohol use No  . Drug use: No  . Sexual activity: Not on file   Other Topics Concern  . Not on file   Social History Narrative  . No narrative on file     Review of Systems  All other systems reviewed and are negative.      Objective:   Physical Exam  Cardiovascular: Normal rate, regular rhythm and normal heart sounds.   Pulmonary/Chest: Effort normal and breath sounds normal.  Abdominal: Soft. Bowel sounds are normal.  Skin: Skin is warm. There is erythema.           Assessment & Plan:  Uncontrolled type 2 diabetes mellitus with ketoacidosis and coma, unspecified long term insulin use status (HCC)  IDDM (insulin dependent diabetes mellitus) ([place]) - Plan: metFORMIN (GLUCOPHAGE) 1000 MG tablet, DISCONTINUED: metFORMIN (GLUCOPHAGE) 1000 MG tablet  Abscess of head, except face  DC triseba due to cost.  Start glipizide extended release 10 mg a day and metformin 1000 mg twice daily. Recheck hemoglobin A1c in 3 months. Complete doxycycline. Wound looks much better. The erythema has reduced dramatically. The packing is  out. I was only able to express a trace amount of pus from the opening. Wound should heal gradually over the next 7-10 days. Recheck in one week if no better or sooner if worse

## 2016-08-12 DIAGNOSIS — L409 Psoriasis, unspecified: Secondary | ICD-10-CM | POA: Diagnosis not present

## 2016-08-12 DIAGNOSIS — L089 Local infection of the skin and subcutaneous tissue, unspecified: Secondary | ICD-10-CM | POA: Diagnosis not present

## 2016-09-22 DIAGNOSIS — L400 Psoriasis vulgaris: Secondary | ICD-10-CM | POA: Diagnosis not present

## 2016-11-17 ENCOUNTER — Encounter: Payer: Self-pay | Admitting: Family Medicine

## 2017-01-25 ENCOUNTER — Telehealth: Payer: Self-pay | Admitting: Family Medicine

## 2017-01-25 NOTE — Telephone Encounter (Signed)
Spoke to wife and we did not ok any of the devices (see media tab). Wife states that his insurance has been billed $4000 and has reported it and has called the company and they are to refund money and come get braces.

## 2017-01-25 NOTE — Telephone Encounter (Signed)
Patients wife is calling to talk to you regarding a back brace that he is being billed for?  Wants to know if dr pickard approved this [phone]

## 2017-01-31 ENCOUNTER — Encounter: Payer: Self-pay | Admitting: Family Medicine

## 2017-03-28 ENCOUNTER — Encounter: Payer: Self-pay | Admitting: Family Medicine

## 2017-04-30 ENCOUNTER — Encounter: Payer: Self-pay | Admitting: Family Medicine

## 2017-05-26 DIAGNOSIS — L738 Other specified follicular disorders: Secondary | ICD-10-CM | POA: Diagnosis not present

## 2017-05-28 ENCOUNTER — Encounter: Payer: Self-pay | Admitting: Family Medicine

## 2017-06-08 ENCOUNTER — Other Ambulatory Visit: Payer: Self-pay | Admitting: Family Medicine

## 2017-06-13 ENCOUNTER — Telehealth: Payer: Self-pay | Admitting: Family Medicine

## 2017-06-13 DIAGNOSIS — E119 Type 2 diabetes mellitus without complications: Secondary | ICD-10-CM | POA: Diagnosis not present

## 2017-06-13 MED ORDER — GLIPIZIDE ER 10 MG PO TB24
10.0000 mg | ORAL_TABLET | Freq: Every day | ORAL | 0 refills | Status: DC
Start: 2017-06-13 — End: 2017-08-01

## 2017-06-13 NOTE — Telephone Encounter (Signed)
Medication called/sent to requested pharmacy  - Pt is discharged from [facility] due to not meeting financial obligations per Dr. Dennard Schaumann. May return to practice once account is paid in full. [facility] until 06/28/2017. Pt still within window for 1 more refill.

## 2017-06-13 NOTE — Telephone Encounter (Signed)
Pt needs refill on glipizide sent to [facility] [place]. Pt has been "DISCHARGED" due to past due balance.

## 2017-06-25 DIAGNOSIS — M79602 Pain in left arm: Secondary | ICD-10-CM | POA: Diagnosis not present

## 2017-06-28 ENCOUNTER — Other Ambulatory Visit: Payer: Self-pay | Admitting: Family Medicine

## 2017-06-28 ENCOUNTER — Encounter: Payer: Self-pay | Admitting: Family Medicine

## 2017-06-28 ENCOUNTER — Ambulatory Visit (INDEPENDENT_AMBULATORY_CARE_PROVIDER_SITE_OTHER): Payer: Medicare Other | Admitting: Family Medicine

## 2017-06-28 VITALS — HR 80 | Temp 98.2°F | Resp 18 | Ht 72.0 in | Wt 271.0 lb

## 2017-06-28 DIAGNOSIS — E1165 Type 2 diabetes mellitus with hyperglycemia: Secondary | ICD-10-CM | POA: Diagnosis not present

## 2017-06-28 DIAGNOSIS — R2232 Localized swelling, mass and lump, left upper limb: Secondary | ICD-10-CM

## 2017-06-28 DIAGNOSIS — E118 Type 2 diabetes mellitus with unspecified complications: Secondary | ICD-10-CM | POA: Diagnosis not present

## 2017-06-28 DIAGNOSIS — R928 Other abnormal and inconclusive findings on diagnostic imaging of breast: Secondary | ICD-10-CM | POA: Diagnosis not present

## 2017-06-28 DIAGNOSIS — E119 Type 2 diabetes mellitus without complications: Principal | ICD-10-CM

## 2017-06-28 DIAGNOSIS — IMO0002 Reserved for concepts with insufficient information to code with codable children: Secondary | ICD-10-CM

## 2017-06-28 DIAGNOSIS — N6322 Unspecified lump in the left breast, upper inner quadrant: Secondary | ICD-10-CM | POA: Diagnosis not present

## 2017-06-28 DIAGNOSIS — Z794 Long term (current) use of insulin: Principal | ICD-10-CM

## 2017-06-28 DIAGNOSIS — IMO0001 Reserved for inherently not codable concepts without codable children: Secondary | ICD-10-CM

## 2017-06-28 MED ORDER — CLINDAMYCIN HCL 300 MG PO CAPS: 300.0000 mg | ORAL_CAPSULE | Freq: Four times a day (QID) | ORAL | 0 refills | Status: DC

## 2017-06-28 NOTE — Progress Notes (Signed)
Subjective:    Patient ID: Ricky Rios, male    DOB: September 27, 1950, 66 y.o.   MRN: [id number]  HPI Patient developed a large subcutaneous mass in his left axilla approximately 1 week ago.  He was seen in urgent care and was started on doxycycline.  The mass is extremely painful to the touch.  However it is also very deep.  It is difficult to ascertain whether it is a deep soft tissue infection such as an abscess versus an enlarged lymph node.  He denies any fevers or chills.  He does report headaches and generalized malaise.  Mass has not improved on doxycycline.  It is approximately 3-4 cm in diameter.  It is approximately 1-1.5 inches deep into the axilla.  It is poorly circumscribed.  It is firm and tender to touch.  There is no overlying erythema.   Past Medical History:  Diagnosis Date  . Chest pain    Diagnostic cardiac cath October 2012. Normal coronary arteries, left ventricular function  . DDD (degenerative disc disease), lumbar   . Diabetes mellitus   . History of back surgery   . History of neck surgery   . HLD (hyperlipidemia) 12/09/2012  . Hypertension   . Obesity   . Right knee injury   . Sleep apnea   . Smoker    Past Surgical History:  Procedure Laterality Date  . APPENDECTOMY    . BACK SURGERY     for ruptured disc lumbar area  . CARDIAC CATHETERIZATION  Oct 2012   nml coronary arteries, aorta, LV fcn  . I&D EXTREMITY Right 02/06/2013   Procedure: IRRIGATION AND DEBRIDEMENT INDEX FINGER;  Surgeon: Tennis Must, MD;  Location: [place];  Service: Orthopedics;  Laterality: Right;  . NECK SURGERY     for ruptured disc  . TONSILLECTOMY     Current Outpatient Medications on File Prior to Visit  Medication Sig Dispense Refill  . doxycycline (VIBRA-TABS) 100 MG tablet Take 1 tablet (100 mg total) by mouth 2 (two) times daily. 20 tablet 0  . glipiZIDE (GLUCOTROL XL) 10 MG 24 hr tablet Take 1 tablet (10 mg total) by mouth daily with breakfast. 30 tablet 0  . traMADol  (ULTRAM) 50 MG tablet Take 1 tablet (50 mg total) by mouth every 6 (six) hours as needed. 6 tablet 0   No current facility-administered medications on file prior to visit.    Allergies  Allergen Reactions  . Lipitor [Atorvastatin Calcium] Other (See Comments)    shaking  . Sulfa Antibiotics Other (See Comments)    unknown  . Zocor [Simvastatin - High Dose] Other (See Comments)    shaking   Social History   Socioeconomic History  . Marital status: Married    Spouse name: Not on file  . Number of children: 2  . Years of education: Not on file  . Highest education level: Not on file  Social Needs  . Financial resource strain: Not on file  . Food insecurity - worry: Not on file  . Food insecurity - inability: Not on file  . Transportation needs - medical: Not on file  . Transportation needs - non-medical: Not on file  Occupational History  . Occupation: Disabled    Employer: Psychologist, occupational  Tobacco Use  . Smoking status: Current Every Day Smoker    Packs/day: 2.00    Types: Cigarettes  . Smokeless tobacco: Never Used  Substance and Sexual Activity  . Alcohol use: No  .  Drug use: No  . Sexual activity: Not on file  Other Topics Concern  . Not on file  Social History Narrative  . Not on file      Review of Systems  All other systems reviewed and are negative.      Objective:   Physical Exam  Cardiovascular: Normal rate, regular rhythm and normal heart sounds.  Pulmonary/Chest: Effort normal and breath sounds normal. No respiratory distress. He has no wheezes. He has no rales.  Abdominal: Soft. Bowel sounds are normal. He exhibits no distension. There is no tenderness. There is no rebound.  Lymphadenopathy:       Head (right side): No submental, no submandibular, no tonsillar and no occipital adenopathy present.       Head (left side): No submental, no submandibular, no tonsillar and no occipital adenopathy present.       Right cervical: No superficial  cervical adenopathy present.      Left cervical: No superficial cervical and no deep cervical adenopathy present.    He has axillary adenopathy.       Left axillary: Pectoral adenopathy present.       Right: No supraclavicular adenopathy present.       Left: No supraclavicular adenopathy present.  Vitals reviewed.   3-4 cm hard firm mass in the left axilla.  It is 1-1.5 inches deep to the surface.  It is poorly circumscribed.  Extremely tender to touch      Assessment & Plan:  Axillary mass, left - Plan: [place] AXILLA LEFT  Diabetes mellitus type 2 with complications, uncontrolled ([place]) - Plan: CBC with Differential/Platelet, COMPLETE METABOLIC PANEL WITH GFR, Hemoglobin A1c  Abscess/soft tissue infection versus atypical lymph node concerning for malignancy.  We will obtain a stat ultrasound of the left axilla to differentiate.  If abnormal lymph node is discovered, he will need surgical consultation for dissection and biopsy of lymph node.  If it is a deep soft tissue infection, he will also need surgical consultation for incision and drainage.  Meanwhile discontinue doxycycline and switch to clindamycin 300 mg 4 times daily in an effort to better cover for MRSA.  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  I will also check a CBC to evaluate for leukocytosis, check a CMP, and also to assess glycemic control with an A1c.  Hopefully we will have an answer with the ultrasound this afternoon and can possibly get the patient scheduled to see a surgeon tomorrow if an infectious abscess is in fact the diagnosis.  If worsening, the patient is instructed to go the emergency room.

## 2017-06-29 ENCOUNTER — Other Ambulatory Visit: Payer: Self-pay | Admitting: Family Medicine

## 2017-06-29 ENCOUNTER — Ambulatory Visit
Admission: RE | Admit: 2017-06-29 | Discharge: 2017-06-29 | Disposition: A | Discharge status: Home / Self Care | Payer: Medicare Other | Source: Ambulatory Visit | Attending: Family Medicine | Admitting: Family Medicine

## 2017-06-29 DIAGNOSIS — L02412 Cutaneous abscess of left axilla: Secondary | ICD-10-CM

## 2017-06-29 DIAGNOSIS — R2232 Localized swelling, mass and lump, left upper limb: Secondary | ICD-10-CM

## 2017-06-29 DIAGNOSIS — N6320 Unspecified lump in the left breast, unspecified quadrant: Secondary | ICD-10-CM

## 2017-06-29 DIAGNOSIS — R928 Other abnormal and inconclusive findings on diagnostic imaging of breast: Secondary | ICD-10-CM | POA: Diagnosis not present

## 2017-06-29 DIAGNOSIS — N6322 Unspecified lump in the left breast, upper inner quadrant: Secondary | ICD-10-CM | POA: Diagnosis not present

## 2017-06-29 LAB — HEMOGLOBIN A1C
HEMOGLOBIN A1C: 6.6 %{Hb} — AB (ref <5.7)
Mean Plasma Glucose: 143 (calc)
eAG (mmol/L): 7.9 (calc)

## 2017-06-29 LAB — COMPLETE METABOLIC PANEL WITH GFR
AG Ratio: 1.1 (calc) (ref 1.0–2.5)
ALBUMIN MSPROF: 3.6 g/dL (ref 3.6–5.1)
ALKALINE PHOSPHATASE (APISO): 71 U/L (ref 40–115)
ALT: 56 U/L — ABNORMAL HIGH (ref 9–46)
AST: 108 U/L — ABNORMAL HIGH (ref 10–35)
BILIRUBIN TOTAL: 0.9 mg/dL (ref 0.2–1.2)
BUN: 12 mg/dL (ref 7–25)
CHLORIDE: 99 mmol/L (ref 98–110)
CO2: 22 mmol/L (ref 20–32)
Calcium: 9 mg/dL (ref 8.6–10.3)
Creat: 0.84 mg/dL (ref 0.70–1.25)
GFR, Est African American: 106 mL/min/{1.73_m2} (ref >=60)
GFR, Est Non African American: 92 mL/min/{1.73_m2} (ref >=60)
GLOBULIN: 3.2 g/dL (ref 1.9–3.7)
GLUCOSE: 98 mg/dL (ref 65–99)
Potassium: 4.3 mmol/L (ref 3.5–5.3)
SODIUM: 130 mmol/L — AB (ref 135–146)
Total Protein: 6.8 g/dL (ref 6.1–8.1)

## 2017-06-29 LAB — CBC WITH DIFFERENTIAL/PLATELET
BASOS ABS: 51 {cells}/uL (ref 0–200)
Basophils Relative: 0.9 %
EOS ABS: 131 {cells}/uL (ref 15–500)
Eosinophils Relative: 2.3 %
HEMATOCRIT: 40.9 % (ref 38.5–50.0)
HEMOGLOBIN: 14.2 g/dL (ref 13.2–17.1)
Lymphs Abs: 1579 cells/uL (ref 850–3900)
MCH: 30 pg (ref 27.0–33.0)
MCHC: 34.7 g/dL (ref 32.0–36.0)
MCV: 86.3 fL (ref 80.0–100.0)
MONOS PCT: 10 %
MPV: 12.9 fL — ABNORMAL HIGH (ref 7.5–12.5)
NEUTROS ABS: 3369 {cells}/uL (ref 1500–7800)
NEUTROS PCT: 59.1 %
Platelets: 111 10*3/uL — ABNORMAL LOW (ref 140–400)
RBC: 4.74 10*6/uL (ref 4.20–5.80)
RDW: 13.9 % (ref 11.0–15.0)
Total Lymphocyte: 27.7 %
WBC mixed population: 570 cells/uL (ref 200–950)
WBC: 5.7 10*3/uL (ref 3.8–10.8)

## 2017-07-05 ENCOUNTER — Ambulatory Visit
Admission: RE | Admit: 2017-07-05 | Discharge: 2017-07-05 | Disposition: A | Discharge status: Home / Self Care | Payer: Medicare Other | Source: Ambulatory Visit | Attending: Family Medicine | Admitting: Family Medicine

## 2017-07-05 ENCOUNTER — Other Ambulatory Visit: Payer: Self-pay | Admitting: Family Medicine

## 2017-07-05 ENCOUNTER — Other Ambulatory Visit: Payer: Medicare Other

## 2017-07-05 DIAGNOSIS — N6320 Unspecified lump in the left breast, unspecified quadrant: Secondary | ICD-10-CM

## 2017-07-05 DIAGNOSIS — L02412 Cutaneous abscess of left axilla: Secondary | ICD-10-CM

## 2017-07-05 DIAGNOSIS — N6322 Unspecified lump in the left breast, upper inner quadrant: Secondary | ICD-10-CM | POA: Diagnosis not present

## 2017-07-05 DIAGNOSIS — N6012 Diffuse cystic mastopathy of left breast: Secondary | ICD-10-CM | POA: Diagnosis not present

## 2017-07-11 ENCOUNTER — Other Ambulatory Visit: Payer: Self-pay | Admitting: Family Medicine

## 2017-07-11 ENCOUNTER — Ambulatory Visit
Admission: RE | Admit: 2017-07-11 | Discharge: 2017-07-11 | Disposition: A | Discharge status: Home / Self Care | Payer: Medicare Other | Source: Ambulatory Visit | Attending: Family Medicine | Admitting: Family Medicine

## 2017-07-11 DIAGNOSIS — R2232 Localized swelling, mass and lump, left upper limb: Secondary | ICD-10-CM

## 2017-07-11 DIAGNOSIS — N611 Abscess of the breast and nipple: Secondary | ICD-10-CM

## 2017-07-16 ENCOUNTER — Other Ambulatory Visit: Payer: Medicare Other

## 2017-07-18 ENCOUNTER — Other Ambulatory Visit: Payer: Self-pay | Admitting: Family Medicine

## 2017-07-18 ENCOUNTER — Ambulatory Visit
Admission: RE | Admit: 2017-07-18 | Discharge: 2017-07-18 | Disposition: A | Discharge status: Home / Self Care | Payer: Medicare Other | Source: Ambulatory Visit | Attending: Family Medicine | Admitting: Family Medicine

## 2017-07-18 DIAGNOSIS — N611 Abscess of the breast and nipple: Secondary | ICD-10-CM

## 2017-07-25 ENCOUNTER — Encounter: Payer: Self-pay | Admitting: Family Medicine

## 2017-07-25 ENCOUNTER — Other Ambulatory Visit: Payer: Self-pay

## 2017-07-25 ENCOUNTER — Ambulatory Visit (INDEPENDENT_AMBULATORY_CARE_PROVIDER_SITE_OTHER): Payer: Medicare Other | Admitting: Family Medicine

## 2017-07-25 VITALS — BP 132/78 | HR 68 | Temp 98.1°F | Resp 16 | Ht 72.0 in | Wt 266.0 lb

## 2017-07-25 DIAGNOSIS — L02412 Cutaneous abscess of left axilla: Secondary | ICD-10-CM

## 2017-07-25 DIAGNOSIS — L239 Allergic contact dermatitis, unspecified cause: Secondary | ICD-10-CM

## 2017-07-25 MED ORDER — PREDNISONE 10 MG PO TABS
ORAL_TABLET | ORAL | 0 refills | Status: DC
Start: 2017-07-25 — End: 2017-08-09

## 2017-07-25 MED ORDER — TRAMADOL HCL 50 MG PO TABS: 50.0000 mg | ORAL_TABLET | Freq: Four times a day (QID) | ORAL | 0 refills | Status: DC | PRN

## 2017-07-25 MED ORDER — HYDROXYZINE HCL 25 MG PO TABS: 25.0000 mg | ORAL_TABLET | Freq: Three times a day (TID) | ORAL | 0 refills | Status: DC | PRN

## 2017-07-25 MED ORDER — METHYLPREDNISOLONE ACETATE 40 MG/ML IJ SUSP
40.0000 mg | Freq: Once | INTRAMUSCULAR | Status: AC
  Administered 2017-07-25: 40 mg via INTRAMUSCULAR

## 2017-07-25 NOTE — Progress Notes (Signed)
Subjective:    Patient ID: Ricky Rios, male    DOB: 1951-02-16, 67 y.o.   MRN: [id number]  Patient presents for Rash (x2 weeks- redness and itching to upper body- has recently been on Clindamycin and thinks it's a reaction)  Patient here with rash for the past 2-3 weeks he cannot give me a specific date that it started.  Back story he was seen for abscess in his left axilla by urgent care about a month ago at that time he was prescribed doxycycline.  He had minimal improvement to the mass in his axilla therefore he came in to see his PCP.  He was started on clindamycin on December 6.  He was also sent for ultrasound of the axilla and had CBC performed.  He states that he was tolerating the clindamycin without any difficulty and had been on this before as the bottle he actually had in the office was from November 3 instead of the prescribed one from December 6.  He states the rash did start to break out towards the end of the antibiotics he believes.  Started with redness and itchiness around his neck and has gone down partially on his chest into bilateral arms.  He is tried over-the-counter healing ointment as well as Benadryl for the itching this is not helped.  He has now completed the clindamycin.  Of note he did have a follow-up ultrasound performed on the 26th that showed decreased size of the abscess he states it was drained by the radiologist there.  He supposed to have another follow-up on Friday.   - No difficulty breathing, no tongue, lip swelling  He does have underlying diabetes mellitus his last A1c was 6.6% he is on metformin and glipizide but he wants to come off of the glipizide as the side effects interfere with him going to a tanning booth which is dermatologist recommend in the past for his "eczema". Also asked for something for pain due to the rash  Review Of Systems: per above   GEN- denies fatigue, fever, weight loss,weakness, recent illness HEENT- denies eye drainage,  change in vision, nasal discharge, CVS- denies chest pain, palpitations RESP- denies SOB, cough, wheeze ABD- denies N/V, change in stools, abd pain GU- denies dysuria, hematuria, dribbling, incontinence MSK- denies joint pain, muscle aches, injury Neuro- denies headache, dizziness, syncope, seizure activity       Objective:    BP 132/78   Pulse 68   Temp 98.1 F (36.7 C) (Oral)   Resp 16   Ht 6' (1.829 m)   Wt 266 lb (120.7 kg)   SpO2 99%   BMI 36.08 kg/m   GEN- NAD, alert and oriented x3 HEENT- PERRL, EOMI, non injected sclera, pink conjunctiva, MMM, oropharynx clear Neck- Supple, no LAD CVS- RRR, no murmur RESP-CTAB Skin- erythema across neck with small hives scattered on back, forearms, no lesions below epigastric region   left axilla small indurated grape size nodule, NT EXT- No edema Pulses- Radial, 2+        Assessment & Plan:      Problem List Items Addressed This Visit    None    Visit Diagnoses    Abscess of left axilla    -  Primary   Much improved in size, in setting of what looks like allergic reaction to the antibiotics, though hard to tell as he has been on clindamycin before, will hold off any further antibiotics at this time.  He is not  having any discomfort from the air in his axilla and it is improving in size he has an appointment on Friday for repeat imaging.  Very interesting the rash that he has around the neck and upper torso area resembles more red man syndrome though note he has not had any vancomycin.  I have given him Depo-Medrol 80 mg here in the office he will be followed with a prednisone taper.  Hydroxyzine will be used at bedtime.  I did refill 15 tablets of his tramadol.  He is to call if there is no improvement of the rash or if it spreads.  He has not had any difficulty breathing or respiratory distress.  In reference to the glipizide will defer to his PCP about management of his diabetes and changing this medication.   Allergic  dermatitis       Relevant Medications   methylPREDNISolone acetate (DEPO-MEDROL) injection 40 mg (Completed) (Start on 07/25/2017  1:15 PM)      Note: This dictation was prepared with Dragon dictation along with smaller phrase technology. Any transcriptional errors that result from this process are unintentional.

## 2017-07-25 NOTE — Patient Instructions (Signed)
Take prednisone Hydroxyzine at bedtime Keep ultrasound appointment for Friday  F/U if rash not improved

## 2017-07-27 ENCOUNTER — Other Ambulatory Visit: Payer: Medicare Other

## 2017-08-01 ENCOUNTER — Telehealth: Payer: Self-pay | Admitting: Family Medicine

## 2017-08-01 MED ORDER — SITAGLIPTIN PHOSPHATE 100 MG PO TABS: 100.0000 mg | ORAL_TABLET | Freq: Every day | ORAL | 3 refills | Status: DC

## 2017-08-01 NOTE — Addendum Note (Signed)
Addended by: Shary Decamp B on: 08/01/2017 04:41 PM   Modules accepted: Orders

## 2017-08-01 NOTE — Telephone Encounter (Signed)
Pt's wife aware of the change in medication and med sent to pharm.

## 2017-08-01 NOTE — Telephone Encounter (Signed)
-----   Message from Susy Frizzle, MD sent at 07/26/2017  6:47 AM EST ----- Regarding: RE: Pt wants a new diabetes medication  DC glipizide and switch to januvia 100 mg a day and recheck labs in 3 months.  ----- Message ----- From: Alycia Rossetti, MD Sent: 07/25/2017   1:14 PM To: Susy Frizzle, MD Subject: Pt wants a new diabetes medication               See my note, he tans for his eczema. Nothing to do with current rash. Glipizide has warning for tanning beds He wants alternative, unfortunately he is medicare.  I did not change anything and deferred to you.

## 2017-08-03 ENCOUNTER — Telehealth: Payer: Self-pay | Admitting: Family Medicine

## 2017-08-03 MED ORDER — GLIPIZIDE ER 10 MG PO TB24: 10.0000 mg | ORAL_TABLET | Freq: Every day | ORAL | 0 refills | Status: DC

## 2017-08-03 NOTE — Telephone Encounter (Signed)
Pt's wife called and states that the medication we called in to replace Glipizide is $300 a month and pt just wanted to let us know that he will just stick to taking the Glipizide. Med list adjusted.

## 2017-08-09 ENCOUNTER — Encounter: Payer: Self-pay | Admitting: Family Medicine

## 2017-08-09 ENCOUNTER — Ambulatory Visit (INDEPENDENT_AMBULATORY_CARE_PROVIDER_SITE_OTHER): Payer: Medicare Other | Admitting: Family Medicine

## 2017-08-09 VITALS — BP 130/64 | HR 88 | Temp 98.1°F | Resp 16 | Ht 72.0 in | Wt 266.0 lb

## 2017-08-09 DIAGNOSIS — L02412 Cutaneous abscess of left axilla: Secondary | ICD-10-CM | POA: Diagnosis not present

## 2017-08-09 DIAGNOSIS — E118 Type 2 diabetes mellitus with unspecified complications: Secondary | ICD-10-CM

## 2017-08-09 DIAGNOSIS — E1165 Type 2 diabetes mellitus with hyperglycemia: Secondary | ICD-10-CM

## 2017-08-09 DIAGNOSIS — IMO0002 Reserved for concepts with insufficient information to code with codable children: Secondary | ICD-10-CM

## 2017-08-09 MED ORDER — PIOGLITAZONE HCL 30 MG PO TABS
30.0000 mg | ORAL_TABLET | Freq: Every day | ORAL | 2 refills | Status: DC
Start: 2017-08-09 — End: 2018-11-29

## 2017-08-09 NOTE — Progress Notes (Signed)
Subjective:    Patient ID: Ricky Rios, male    DOB: September 14, 1950, 67 y.o.   MRN: [id number]  HPI  06/28/17 Patient developed a large subcutaneous mass in his left axilla approximately 1 week ago.  He was seen in urgent care and was started on doxycycline.  The mass is extremely painful to the touch.  However it is also very deep.  It is difficult to ascertain whether it is a deep soft tissue infection such as an abscess versus an enlarged lymph node.  He denies any fevers or chills.  He does report headaches and generalized malaise.  Mass has not improved on doxycycline.  It is approximately 3-4 cm in diameter.  It is approximately 1-1.5 inches deep into the axilla.  It is poorly circumscribed.  It is firm and tender to touch.  There is no overlying erythema.  At that time, my plan was: Abscess/soft tissue infection versus atypical lymph node concerning for malignancy.  We will obtain a stat ultrasound of the left axilla to differentiate.  If abnormal lymph node is discovered, he will need surgical consultation for dissection and biopsy of lymph node.  If it is a deep soft tissue infection, he will also need surgical consultation for incision and drainage.  Meanwhile discontinue doxycycline and switch to clindamycin 300 mg 4 times daily in an effort to better cover for MRSA.  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  I will also check a CBC to evaluate for leukocytosis, check a CMP, and also to assess glycemic control with an A1c.  Hopefully we will have an answer with the ultrasound this afternoon and can possibly get the patient scheduled to see a surgeon tomorrow if an infectious abscess is in fact the diagnosis.  If worsening, the patient is instructed to go the emergency room.  08/09/17 Patient went for an ultrasound of the axilla and was found to have an abscess in the left axilla.  He underwent percutaneous needle drainage via ultrasound guidance by radiology.  This temporarily improved the abscess  in addition to the use of clindamycin.  However the abscess has reformed.  He continues to have a 3-4 cm firm subcutaneous mass in his left axilla that is tender to the touch.  He also wants to discontinue glipizide.  He has a history of eczema and he believes that the glipizide is causing his eczema.  I explained that I do not believe is causing his eczema but he is convinced because he has an underlying sulfur allergy that the glipizide is what is doing it.  He can does have an eczematous rash on the flexor surfaces of both forearms and both antecubital fossae.  However he requires glipizide in addition to metformin to manage his sugars.  He will not or cannot afford name brand medication.  Therefore his only other generic option would be Actos Past Medical History:  Diagnosis Date  . Chest pain    Diagnostic cardiac cath October 2012. Normal coronary arteries, left ventricular function  . DDD (degenerative disc disease), lumbar   . Diabetes mellitus   . History of back surgery   . History of neck surgery   . HLD (hyperlipidemia) 12/09/2012  . Hypertension   . Obesity   . Right knee injury   . Sleep apnea   . Smoker    Past Surgical History:  Procedure Laterality Date  . APPENDECTOMY    . BACK SURGERY     for ruptured disc lumbar area  .  CARDIAC CATHETERIZATION  Oct 2012   nml coronary arteries, aorta, LV fcn  . I&D EXTREMITY Right 02/06/2013   Procedure: IRRIGATION AND DEBRIDEMENT INDEX FINGER;  Surgeon: Tennis Must, MD;  Location: [facility];  Service: Orthopedics;  Laterality: Right;  . NECK SURGERY     for ruptured disc  . TONSILLECTOMY     Current Outpatient Medications on File Prior to Visit  Medication Sig Dispense Refill  . glipiZIDE (GLUCOTROL XL) 10 MG 24 hr tablet Take 1 tablet (10 mg total) by mouth daily with breakfast. 30 tablet 0  . hydrOXYzine (ATARAX/VISTARIL) 25 MG tablet Take 1 tablet (25 mg total) by mouth 3 (three) times daily as needed. 20 tablet 0  . metFORMIN  (GLUCOPHAGE) 1000 MG tablet TAKE 1 TABLET BY MOUTH TWICE A DAY WITH A MEAL 180 tablet 3  . traMADol (ULTRAM) 50 MG tablet Take 1 tablet (50 mg total) by mouth every 6 (six) hours as needed. 15 tablet 0   No current facility-administered medications on file prior to visit.    Allergies  Allergen Reactions  . Lipitor [Atorvastatin Calcium] Other (See Comments)    shaking  . Sulfa Antibiotics Other (See Comments)    unknown  . Zocor [Simvastatin - High Dose] Other (See Comments)    shaking   Social History   Socioeconomic History  . Marital status: Married    Spouse name: Not on file  . Number of children: 2  . Years of education: Not on file  . Highest education level: Not on file  Social Needs  . Financial resource strain: Not on file  . Food insecurity - worry: Not on file  . Food insecurity - inability: Not on file  . Transportation needs - medical: Not on file  . Transportation needs - non-medical: Not on file  Occupational History  . Occupation: Disabled    Employer: Psychologist, occupational  Tobacco Use  . Smoking status: Current Every Day Smoker    Packs/day: 2.00    Types: Cigarettes  . Smokeless tobacco: Never Used  Substance and Sexual Activity  . Alcohol use: No  . Drug use: No  . Sexual activity: Not on file  Other Topics Concern  . Not on file  Social History Narrative  . Not on file      Review of Systems  All other systems reviewed and are negative.      Objective:   Physical Exam  Cardiovascular: Normal rate, regular rhythm and normal heart sounds.  Pulmonary/Chest: Effort normal and breath sounds normal. No respiratory distress. He has no wheezes. He has no rales.  Abdominal: Soft. Bowel sounds are normal. He exhibits no distension. There is no tenderness. There is no rebound.  Lymphadenopathy:       Head (right side): No submental, no submandibular, no tonsillar and no occipital adenopathy present.       Head (left side): No submental, no  submandibular, no tonsillar and no occipital adenopathy present.       Right cervical: No superficial cervical adenopathy present.      Left cervical: No superficial cervical and no deep cervical adenopathy present.       Right: No supraclavicular adenopathy present.       Left: No supraclavicular adenopathy present.  Vitals reviewed.   3-4 cm hard firm mass in the left axilla.  It is 1-1.5 inches deep to the surface.  It is poorly circumscribed.     Assessment & Plan:  Abscess of  left axilla  Diabetes mellitus type 2 with complications, uncontrolled ([place])  I will consult general surgery for incision and drainage of the left axillary abscess.  I explained to the patient that I do not believe glipizide is causing his eczema.  However he still wants to stop it.  Continue metformin and replace glipizide with Actos 30 mg a day.  If rash is no better after discontinuing glipizide, I would prescribe the patient topical corticosteroid creams for his eczematous rash

## 2017-08-20 ENCOUNTER — Telehealth: Payer: Self-pay | Admitting: Family Medicine

## 2017-08-20 DIAGNOSIS — E119 Type 2 diabetes mellitus without complications: Principal | ICD-10-CM

## 2017-08-20 DIAGNOSIS — Z794 Long term (current) use of insulin: Principal | ICD-10-CM

## 2017-08-20 DIAGNOSIS — IMO0001 Reserved for inherently not codable concepts without codable children: Secondary | ICD-10-CM

## 2017-08-20 NOTE — Telephone Encounter (Signed)
Pt's wife called and states that the pt is still itching and burning and would like to get a refill on Hydroxyzine or does he need to come in for an apt??

## 2017-08-21 NOTE — Telephone Encounter (Signed)
Tried to call pt no answer and no vm.

## 2017-08-21 NOTE — Telephone Encounter (Signed)
I would resume glipizide because it is not causing his rash but was controlling his sugars.  I will gladly refill hydroxyzine 25 po q 8 hrs prn itching.  He can use triamcinolone 0.1% cream bid for rash.

## 2017-08-22 MED ORDER — METFORMIN HCL 1000 MG PO TABS: ORAL_TABLET | ORAL | 3 refills | Status: DC

## 2017-08-22 MED ORDER — HYDROXYZINE HCL 25 MG PO TABS: 25.0000 mg | ORAL_TABLET | Freq: Three times a day (TID) | ORAL | 0 refills | Status: DC | PRN

## 2017-08-22 MED ORDER — TRIAMCINOLONE ACETONIDE 0.1 % EX CREA: 1.0000 "application " | TOPICAL_CREAM | Freq: Two times a day (BID) | CUTANEOUS | 1 refills | Status: DC

## 2017-08-22 NOTE — Telephone Encounter (Signed)
Patient aware of results and of providers recommendations and med sent to pharm

## 2017-08-30 ENCOUNTER — Telehealth: Payer: Self-pay | Admitting: Family Medicine

## 2017-08-30 NOTE — Telephone Encounter (Signed)
Pt's wife called [facility] wanting to know if we can refill his antibx for the place under his arm. It is better but it is still there.

## 2017-08-31 MED ORDER — CLINDAMYCIN HCL 300 MG PO CAPS: 300.0000 mg | ORAL_CAPSULE | Freq: Three times a day (TID) | ORAL | 0 refills | Status: AC

## 2017-08-31 NOTE — Telephone Encounter (Signed)
Pt aware, med sent to pharm and note sent to f/u on referral to [facility]

## 2017-08-31 NOTE — Telephone Encounter (Signed)
Clindamycin 300 mg potid for 10 days.  He needs to see general surgery for I+D.  Please check on this referral.  If not placed (by accident), please order.

## 2017-09-03 ENCOUNTER — Other Ambulatory Visit: Payer: Self-pay | Admitting: Family Medicine

## 2017-09-07 DIAGNOSIS — L02419 Cutaneous abscess of limb, unspecified: Secondary | ICD-10-CM | POA: Diagnosis not present

## 2017-10-06 ENCOUNTER — Other Ambulatory Visit: Payer: Self-pay | Admitting: Family Medicine

## 2017-10-22 ENCOUNTER — Other Ambulatory Visit: Payer: Self-pay | Admitting: Family Medicine

## 2017-11-05 ENCOUNTER — Encounter: Payer: Self-pay | Admitting: Family Medicine

## 2018-01-15 ENCOUNTER — Other Ambulatory Visit: Payer: Self-pay | Admitting: Family Medicine

## 2018-02-11 ENCOUNTER — Encounter: Payer: Self-pay | Admitting: Family Medicine

## 2018-05-07 ENCOUNTER — Telehealth: Payer: Self-pay | Admitting: Family Medicine

## 2018-05-07 NOTE — Telephone Encounter (Signed)
Patients wife rachel calling with questions regarding his glipiside  Please call her back at [phone]

## 2018-05-09 MED ORDER — GLIPIZIDE ER 10 MG PO TB24: 10.0000 mg | ORAL_TABLET | Freq: Every day | ORAL | 3 refills | Status: DC

## 2018-05-09 NOTE — Telephone Encounter (Signed)
Tried to call no answer and no vm 

## 2018-05-09 NOTE — Telephone Encounter (Signed)
Pt needed refill on Glipizide to [facility] - med sent to pharm

## 2018-07-04 ENCOUNTER — Other Ambulatory Visit: Payer: Self-pay | Admitting: Family Medicine

## 2018-10-24 ENCOUNTER — Other Ambulatory Visit: Payer: Self-pay | Admitting: Family Medicine

## 2018-11-11 DIAGNOSIS — M5416 Radiculopathy, lumbar region: Secondary | ICD-10-CM | POA: Diagnosis not present

## 2018-11-22 ENCOUNTER — Other Ambulatory Visit: Payer: Self-pay | Admitting: Family Medicine

## 2018-11-29 ENCOUNTER — Ambulatory Visit (INDEPENDENT_AMBULATORY_CARE_PROVIDER_SITE_OTHER): Payer: Medicare Other | Admitting: Family Medicine

## 2018-11-29 ENCOUNTER — Other Ambulatory Visit: Payer: Self-pay

## 2018-11-29 ENCOUNTER — Telehealth: Payer: Self-pay | Admitting: Family Medicine

## 2018-11-29 VITALS — BP 132/78 | HR 89 | Temp 98.7°F | Resp 16 | Ht 72.0 in | Wt 266.0 lb

## 2018-11-29 DIAGNOSIS — E785 Hyperlipidemia, unspecified: Secondary | ICD-10-CM | POA: Diagnosis not present

## 2018-11-29 DIAGNOSIS — I10 Essential (primary) hypertension: Secondary | ICD-10-CM | POA: Diagnosis not present

## 2018-11-29 DIAGNOSIS — M5136 Other intervertebral disc degeneration, lumbar region: Secondary | ICD-10-CM | POA: Diagnosis not present

## 2018-11-29 DIAGNOSIS — E118 Type 2 diabetes mellitus with unspecified complications: Secondary | ICD-10-CM | POA: Diagnosis not present

## 2018-11-29 DIAGNOSIS — K7581 Nonalcoholic steatohepatitis (NASH): Secondary | ICD-10-CM

## 2018-11-29 MED ORDER — TRIAMCINOLONE ACETONIDE 0.1 % EX CREA: 1.0000 "application " | TOPICAL_CREAM | Freq: Two times a day (BID) | CUTANEOUS | 0 refills | Status: DC

## 2018-11-29 NOTE — Telephone Encounter (Signed)
error 

## 2018-11-29 NOTE — Progress Notes (Signed)
Subjective:    Patient ID: Ricky Rios, male    DOB: 1950/08/01, 68 y.o.   MRN: [id number]  HPI  Patient is here today to reestablish care.  He has not been seen in almost 16 months.  Past medical history is significant for diabetes mellitus type 2, hypertension, metabolic syndrome, dyslipidemia, and nonalcoholic steatohepatitis.  Since I last saw him, the patient has been dealing with severe low back pain.  Per his report he has 3 herniated disc in his lower back and is dealing with near constant left-sided lumbar radiculopathy.  His orthopedist has given him tramadol and hydrocodone.  Neither 1 of which is helped his pain at all.  He is taking hydrocodone 3-4 times a day and is also using Tylenol in addition to that which I am concerned will only exacerbate his underlying hepatitis.  He denies any chest pain shortness of breath or dyspnea on exertion.  However he has severe eczema and atopic dermatitis on the flexor surfaces of both arms from his biceps down to his wrist.  There is a large confluent plaque of erythematous scaly skin which is extremely dry and lichenified with numerous excoriations.  He is tried numerous over-the-counter creams and right now is treating it by applying VapoRub every day to soothe the itching and discomfort.  He denies any polyuria, polydipsia, or blurry vision.  He states that when he checks his fasting sugars are typically under 100.  He denies ever seeing a blood sugar greater than 200 in the afternoon.  He denies any blurry vision. Past Medical History:  Diagnosis Date  . Chest pain    Diagnostic cardiac cath October 2012. Normal coronary arteries, left ventricular function  . DDD (degenerative disc disease), lumbar   . Diabetes mellitus   . History of back surgery   . History of neck surgery   . HLD (hyperlipidemia) 12/09/2012  . Hypertension   . Obesity   . Right knee injury   . Sleep apnea   . Smoker    Past Surgical History:  Procedure  Laterality Date  . APPENDECTOMY    . BACK SURGERY     for ruptured disc lumbar area  . CARDIAC CATHETERIZATION  Oct 2012   nml coronary arteries, aorta, LV fcn  . I&D EXTREMITY Right 02/06/2013   Procedure: IRRIGATION AND DEBRIDEMENT INDEX FINGER;  Surgeon: Tennis Must, MD;  Location: [facility];  Service: Orthopedics;  Laterality: Right;  . NECK SURGERY     for ruptured disc  . TONSILLECTOMY     Current Outpatient Medications on File Prior to Visit  Medication Sig Dispense Refill  . glipiZIDE (GLUCOTROL XL) 10 MG 24 hr tablet Take 1 tablet by mouth once daily with breakfast 30 tablet 0  . hydrOXYzine (ATARAX/VISTARIL) 25 MG tablet TAKE 1 TABLET BY MOUTH THREE TIMES DAILY AS NEEDED 60 tablet 0  . metFORMIN (GLUCOPHAGE) 1000 MG tablet TAKE 1 TABLET BY MOUTH TWICE A DAY WITH A MEAL 180 tablet 3  . traMADol (ULTRAM) 50 MG tablet Take 1 tablet (50 mg total) by mouth every 6 (six) hours as needed. 15 tablet 0  . triamcinolone cream (KENALOG) 0.1 % APPLY CREAM EXTERNALLY TWICE DAILY 80 g 0   No current facility-administered medications on file prior to visit.    Allergies  Allergen Reactions  . Lipitor [Atorvastatin Calcium] Other (See Comments)    shaking  . Sulfa Antibiotics Other (See Comments)    unknown  . Zocor [Simvastatin -  High Dose] Other (See Comments)    shaking   Social History   Socioeconomic History  . Marital status: Married    Spouse name: Not on file  . Number of children: 2  . Years of education: Not on file  . Highest education level: Not on file  Occupational History  . Occupation: Disabled    Employer: Psychologist, occupational  Social Needs  . Financial resource strain: Not on file  . Food insecurity:    Worry: Not on file    Inability: Not on file  . Transportation needs:    Medical: Not on file    Non-medical: Not on file  Tobacco Use  . Smoking status: Current Every Day Smoker    Packs/day: 2.00    Types: Cigarettes  . Smokeless tobacco: Never Used   Substance and Sexual Activity  . Alcohol use: No  . Drug use: No  . Sexual activity: Not on file  Lifestyle  . Physical activity:    Days per week: Not on file    Minutes per session: Not on file  . Stress: Not on file  Relationships  . Social connections:    Talks on phone: Not on file    Gets together: Not on file    Attends religious service: Not on file    Active member of club or organization: Not on file    Attends meetings of clubs or organizations: Not on file    Relationship status: Not on file  . Intimate partner violence:    Fear of current or ex partner: Not on file    Emotionally abused: Not on file    Physically abused: Not on file    Forced sexual activity: Not on file  Other Topics Concern  . Not on file  Social History Narrative  . Not on file     Review of Systems  All other systems reviewed and are negative.      Objective:   Physical Exam Vitals signs reviewed.  Constitutional:      Appearance: Normal appearance. He is obese.  Cardiovascular:     Rate and Rhythm: Normal rate and regular rhythm.     Pulses: Normal pulses.     Heart sounds: Normal heart sounds. No murmur. No gallop.   Pulmonary:     Effort: Pulmonary effort is normal. No respiratory distress.     Breath sounds: Normal breath sounds. No stridor. No wheezing, rhonchi or rales.  Chest:     Chest wall: No tenderness.  Abdominal:     General: Abdomen is flat. Bowel sounds are normal. There is no distension.     Palpations: Abdomen is soft. There is no mass.     Tenderness: There is no abdominal tenderness. There is no right CVA tenderness, left CVA tenderness, guarding or rebound.     Hernia: No hernia is present.  Musculoskeletal:     Right lower leg: No edema.     Left lower leg: No edema.  Skin:    Findings: Erythema and rash present.  Neurological:     Mental Status: He is alert.           Assessment & Plan:  Controlled type 2 diabetes mellitus with complication,  without long-term current use of insulin (HCC) - Plan: Hemoglobin A1c, COMPLETE METABOLIC PANEL WITH GFR, Lipid panel  Essential hypertension  DDD (degenerative disc disease), lumbar  Dyslipidemia  NASH (nonalcoholic steatohepatitis)  The patient's reported blood sugars sound well controlled.  However I will obtain a hemoglobin A1c today.  Ideally I like his A1c less than 6.5.  I have also encouraged the patient to try to lose 20 to 30 pounds to help manage his diabetes and prevent long-term complications.  I also believe that this will help his low back pain.  His blood pressures well controlled today 132/78.  I will check a CMP to monitor his kidney function.  Given his history of nonalcoholic steatohepatitis I will also check a CMP to monitor for any evidence of liver inflammation particular given the amount of Tylenol he is taking.  Based on the results of his lab work, I have suggested switching the patient to oxycodone 5 to 10 mg 3 times daily for his back pain to avoid Tylenol particular if there is evidence of liver inflammation.  I will also check a fasting lipid panel to monitor his history of dyslipidemia and metabolic syndrome.  As a diabetic he would benefit from keeping his LDL cholesterol below 100 and his HDL cholesterol greater than 45.  Therefore I recommended aerobic exercise as tolerated up to 30 minutes a day 5 days a week.  He has atopic dermatitis on the flexor surfaces of both arms from the biceps to the wrist.  I have given the patient a jar of triamcinolone 0.1% cream to apply twice daily to better control severe atopic dermatitis.  Once better controlled, he can switch to Vaseline as a means to help prevent flareups.

## 2018-11-30 LAB — LIPID PANEL
Cholesterol: 199 mg/dL (ref <200)
HDL: 32 mg/dL — ABNORMAL LOW (ref >=40)
LDL Cholesterol (Calc): 128 mg/dL (calc) — ABNORMAL HIGH
Non-HDL Cholesterol (Calc): 167 mg/dL (calc) — ABNORMAL HIGH (ref <130)
Total CHOL/HDL Ratio: 6.2 (calc) — ABNORMAL HIGH (ref <5.0)
Triglycerides: 256 mg/dL — ABNORMAL HIGH (ref <150)

## 2018-11-30 LAB — COMPLETE METABOLIC PANEL WITH GFR
AG Ratio: 1.2 (calc) (ref 1.0–2.5)
ALT: 28 U/L (ref 9–46)
AST: 45 U/L — ABNORMAL HIGH (ref 10–35)
Albumin: 4.1 g/dL (ref 3.6–5.1)
Alkaline phosphatase (APISO): 70 U/L (ref 35–144)
BUN: 11 mg/dL (ref 7–25)
CO2: 22 mmol/L (ref 20–32)
Calcium: 9.7 mg/dL (ref 8.6–10.3)
Chloride: 105 mmol/L (ref 98–110)
Creat: 0.91 mg/dL (ref 0.70–1.25)
GFR, Est African American: 101 mL/min/{1.73_m2} (ref >=60)
GFR, Est Non African American: 88 mL/min/{1.73_m2} (ref >=60)
Globulin: 3.3 g/dL (calc) (ref 1.9–3.7)
Glucose, Bld: 111 mg/dL — ABNORMAL HIGH (ref 65–99)
Potassium: 3.9 mmol/L (ref 3.5–5.3)
Sodium: 138 mmol/L (ref 135–146)
Total Bilirubin: 0.8 mg/dL (ref 0.2–1.2)
Total Protein: 7.4 g/dL (ref 6.1–8.1)

## 2018-11-30 LAB — HEMOGLOBIN A1C
Hgb A1c MFr Bld: 5.4 % of total Hgb (ref <5.7)
Mean Plasma Glucose: 108 (calc)
eAG (mmol/L): 6 (calc)

## 2018-12-06 ENCOUNTER — Other Ambulatory Visit: Payer: Self-pay | Admitting: Family Medicine

## 2018-12-06 MED ORDER — EZETIMIBE 10 MG PO TABS: 10.0000 mg | ORAL_TABLET | Freq: Every day | ORAL | 3 refills | Status: DC

## 2018-12-20 ENCOUNTER — Other Ambulatory Visit: Payer: Self-pay | Admitting: Family Medicine

## 2018-12-20 DIAGNOSIS — IMO0001 Reserved for inherently not codable concepts without codable children: Secondary | ICD-10-CM

## 2019-01-04 ENCOUNTER — Other Ambulatory Visit: Payer: Self-pay | Admitting: Family Medicine

## 2019-01-06 DIAGNOSIS — E119 Type 2 diabetes mellitus without complications: Secondary | ICD-10-CM | POA: Diagnosis not present

## 2019-01-06 DIAGNOSIS — Z961 Presence of intraocular lens: Secondary | ICD-10-CM | POA: Diagnosis not present

## 2019-01-06 DIAGNOSIS — H25812 Combined forms of age-related cataract, left eye: Secondary | ICD-10-CM | POA: Diagnosis not present

## 2019-01-06 LAB — HM DIABETES EYE EXAM

## 2019-01-10 ENCOUNTER — Telehealth: Payer: Self-pay | Admitting: Family Medicine

## 2019-01-10 DIAGNOSIS — H2512 Age-related nuclear cataract, left eye: Secondary | ICD-10-CM | POA: Diagnosis not present

## 2019-01-10 NOTE — Telephone Encounter (Signed)
Patient calling to ask questions about his glipizide  [phone]

## 2019-01-14 MED ORDER — GLIPIZIDE ER 10 MG PO TB24: ORAL_TABLET | ORAL | 0 refills | Status: DC

## 2019-01-14 NOTE — Telephone Encounter (Signed)
Pt received a rx for the glipizide and this was dc'd at his lov. Pharmacy made aware to dc this medication via escribe.

## 2019-01-15 ENCOUNTER — Encounter: Payer: Self-pay | Admitting: Family Medicine

## 2019-02-21 ENCOUNTER — Other Ambulatory Visit: Payer: Self-pay

## 2019-06-12 ENCOUNTER — Other Ambulatory Visit: Payer: Self-pay

## 2019-06-12 DIAGNOSIS — Z961 Presence of intraocular lens: Secondary | ICD-10-CM | POA: Diagnosis not present

## 2019-06-12 DIAGNOSIS — E119 Type 2 diabetes mellitus without complications: Secondary | ICD-10-CM | POA: Diagnosis not present

## 2019-06-12 DIAGNOSIS — H25812 Combined forms of age-related cataract, left eye: Secondary | ICD-10-CM | POA: Diagnosis not present

## 2019-06-13 ENCOUNTER — Ambulatory Visit (INDEPENDENT_AMBULATORY_CARE_PROVIDER_SITE_OTHER): Payer: Medicare Other | Admitting: Family Medicine

## 2019-06-13 ENCOUNTER — Encounter: Payer: Self-pay | Admitting: Family Medicine

## 2019-06-13 VITALS — BP 136/74 | HR 86 | Temp 98.2°F | Resp 16 | Ht 72.0 in | Wt 269.0 lb

## 2019-06-13 DIAGNOSIS — Z23 Encounter for immunization: Secondary | ICD-10-CM

## 2019-06-13 DIAGNOSIS — L2089 Other atopic dermatitis: Secondary | ICD-10-CM | POA: Diagnosis not present

## 2019-06-13 MED ORDER — HYDROXYZINE HCL 25 MG PO TABS: 25.0000 mg | ORAL_TABLET | Freq: Three times a day (TID) | ORAL | 0 refills | Status: DC | PRN

## 2019-06-13 MED ORDER — CEPHALEXIN 500 MG PO CAPS: 500.0000 mg | ORAL_CAPSULE | Freq: Three times a day (TID) | ORAL | 0 refills | Status: DC

## 2019-06-13 MED ORDER — PREDNISONE 20 MG PO TABS: ORAL_TABLET | ORAL | 0 refills | Status: DC

## 2019-06-13 MED ORDER — MOMETASONE FUROATE 0.1 % EX OINT: TOPICAL_OINTMENT | Freq: Every day | CUTANEOUS | 3 refills | Status: DC

## 2019-06-13 NOTE — Progress Notes (Signed)
Subjective:    Patient ID: Ricky Rios, male    DOB: Mar 10, 1951, 68 y.o.   MRN: [id number]  HPI  Patient has an inflamed erythematous pink papulosquamous eruption on the dorsum of both forearms.  This extends from the antecubital fossa down along the dorsum of the forearm and onto the hand.  The skin on the hand has many fissures and cracks in the webspaces and on the extensor surfaces of the PIP and DIP joints.  The skin is flaking and peeling on the hand.  The patient has severe dyshidrotic eczema as well as severe atopic dermatitis on the forearms.  He states that it itches terribly.  He denies any fever or chills.  He denies any other rash anywhere else on his body.  He has tried Vaseline, and triamcinolone without relief. Past Medical History:  Diagnosis Date  . Chest pain    Diagnostic cardiac cath October 2012. Normal coronary arteries, left ventricular function  . DDD (degenerative disc disease), lumbar   . Diabetes mellitus   . History of back surgery   . History of neck surgery   . HLD (hyperlipidemia) 12/09/2012  . Hypertension   . Obesity   . Right knee injury   . Sleep apnea   . Smoker    Past Surgical History:  Procedure Laterality Date  . APPENDECTOMY    . BACK SURGERY     for ruptured disc lumbar area  . CARDIAC CATHETERIZATION  Oct 2012   nml coronary arteries, aorta, LV fcn  . I&D EXTREMITY Right 02/06/2013   Procedure: IRRIGATION AND DEBRIDEMENT INDEX FINGER;  Surgeon: Tennis Must, MD;  Location: [facility];  Service: Orthopedics;  Laterality: Right;  . NECK SURGERY     for ruptured disc  . TONSILLECTOMY     Current Outpatient Medications on File Prior to Visit  Medication Sig Dispense Refill  . ezetimibe (ZETIA) 10 MG tablet Take 1 tablet (10 mg total) by mouth daily. 90 tablet 3  . metFORMIN (GLUCOPHAGE) 1000 MG tablet TAKE 1 TABLET BY MOUTH TWICE DAILY WITH MEALS 180 tablet 1  . traMADol (ULTRAM) 50 MG tablet Take 1 tablet (50 mg total) by mouth every  6 (six) hours as needed. 15 tablet 0  . triamcinolone cream (KENALOG) 0.1 % APPLY CREAM EXTERNALLY TWICE DAILY 80 g 0   No current facility-administered medications on file prior to visit.    Allergies  Allergen Reactions  . Lipitor [Atorvastatin Calcium] Other (See Comments)    shaking  . Sulfa Antibiotics Other (See Comments)    unknown  . Zocor [Simvastatin - High Dose] Other (See Comments)    shaking   Social History   Socioeconomic History  . Marital status: Married    Spouse name: Not on file  . Number of children: 2  . Years of education: Not on file  . Highest education level: Not on file  Occupational History  . Occupation: Disabled    Employer: Psychologist, occupational  Social Needs  . Financial resource strain: Not on file  . Food insecurity    Worry: Not on file    Inability: Not on file  . Transportation needs    Medical: Not on file    Non-medical: Not on file  Tobacco Use  . Smoking status: Current Every Day Smoker    Packs/day: 2.00    Types: Cigarettes  . Smokeless tobacco: Never Used  Substance and Sexual Activity  . Alcohol use: No  .  Drug use: No  . Sexual activity: Not on file  Lifestyle  . Physical activity    Days per week: Not on file    Minutes per session: Not on file  . Stress: Not on file  Relationships  . Social Herbalist on phone: Not on file    Gets together: Not on file    Attends religious service: Not on file    Active member of club or organization: Not on file    Attends meetings of clubs or organizations: Not on file    Relationship status: Not on file  . Intimate partner violence    Fear of current or ex partner: Not on file    Emotionally abused: Not on file    Physically abused: Not on file    Forced sexual activity: Not on file  Other Topics Concern  . Not on file  Social History Narrative  . Not on file     Review of Systems  All other systems reviewed and are negative.      Objective:   Physical  Exam Vitals signs reviewed.  Constitutional:      Appearance: Normal appearance. He is obese.  Cardiovascular:     Rate and Rhythm: Normal rate and regular rhythm.     Pulses: Normal pulses.     Heart sounds: Normal heart sounds. No murmur. No gallop.   Pulmonary:     Effort: Pulmonary effort is normal. No respiratory distress.     Breath sounds: Normal breath sounds. No stridor. No wheezing, rhonchi or rales.  Chest:     Chest wall: No tenderness.  Musculoskeletal:     Right lower leg: No edema.     Left lower leg: No edema.  Skin:    Findings: Erythema and rash present.       Neurological:     Mental Status: He is alert.           Assessment & Plan:  Other atopic dermatitis  Recommended a prednisone taper pack given the severity of the atopic dermatitis today and then switching to Elocon ointment once daily afterwards to help maintain control.  Once the rash improves try to wean down on the eloquent every other day or even every third day as a maintenance medication.  There is no evidence of secondary cellulitis today however I did give the patient a prescription for Keflex in case he develops fever or asymmetric erythema with warmth and pain suggesting a secondary infection as the patient is leaving the state and going to [place].

## 2019-06-16 DIAGNOSIS — H25012 Cortical age-related cataract, left eye: Secondary | ICD-10-CM | POA: Diagnosis not present

## 2019-06-16 DIAGNOSIS — H2512 Age-related nuclear cataract, left eye: Secondary | ICD-10-CM | POA: Diagnosis not present

## 2019-06-16 DIAGNOSIS — H25812 Combined forms of age-related cataract, left eye: Secondary | ICD-10-CM | POA: Diagnosis not present

## 2019-06-18 ENCOUNTER — Other Ambulatory Visit: Payer: Self-pay

## 2019-08-08 ENCOUNTER — Ambulatory Visit (INDEPENDENT_AMBULATORY_CARE_PROVIDER_SITE_OTHER): Payer: Medicare Other | Admitting: Family Medicine

## 2019-08-08 ENCOUNTER — Ambulatory Visit (HOSPITAL_COMMUNITY)
Admission: RE | Admit: 2019-08-08 | Discharge: 2019-08-08 | Disposition: A | Discharge status: Home / Self Care | Payer: Medicare Other | Source: Ambulatory Visit | Attending: Family Medicine | Admitting: Family Medicine

## 2019-08-08 ENCOUNTER — Encounter: Payer: Self-pay | Admitting: Family Medicine

## 2019-08-08 ENCOUNTER — Other Ambulatory Visit: Payer: Self-pay

## 2019-08-08 VITALS — BP 134/62 | HR 86 | Temp 97.0°F | Resp 20 | Ht 72.0 in | Wt 272.0 lb

## 2019-08-08 DIAGNOSIS — M7989 Other specified soft tissue disorders: Secondary | ICD-10-CM | POA: Diagnosis not present

## 2019-08-08 DIAGNOSIS — M79604 Pain in right leg: Secondary | ICD-10-CM | POA: Diagnosis not present

## 2019-08-08 NOTE — Progress Notes (Signed)
Subjective:    Patient ID: Ricky Rios, male    DOB: 12-25-1950, 69 y.o.   MRN: [id number]  HPI Patient presents today with unilateral leg swelling in his right leg distal to the knee.  He has +1 pitting edema in his right leg distal to the knee compared to the left leg this is asymmetric.  He also has pain with palpation of the leg and squeezing his calf.  He has a positive Homans' sign.  Calf circumference measured below the patella is 46 cm on the right.  Calf circumference measured below the patella his 42 cm on the left.  There is no erythema or warmth.  He denies any chest pain or shortness of breath or pleurisy. Past Medical History:  Diagnosis Date  . Chest pain    Diagnostic cardiac cath October 2012. Normal coronary arteries, left ventricular function  . DDD (degenerative disc disease), lumbar   . Diabetes mellitus   . History of back surgery   . History of neck surgery   . HLD (hyperlipidemia) 12/09/2012  . Hypertension   . Obesity   . Right knee injury   . Sleep apnea   . Smoker    Past Surgical History:  Procedure Laterality Date  . APPENDECTOMY    . BACK SURGERY     for ruptured disc lumbar area  . CARDIAC CATHETERIZATION  Oct 2012   nml coronary arteries, aorta, LV fcn  . I & D EXTREMITY Right 02/06/2013   Procedure: IRRIGATION AND DEBRIDEMENT INDEX FINGER;  Surgeon: Tennis Must, MD;  Location: [facility];  Service: Orthopedics;  Laterality: Right;  . NECK SURGERY     for ruptured disc  . TONSILLECTOMY     Current Outpatient Medications on File Prior to Visit  Medication Sig Dispense Refill  . ezetimibe (ZETIA) 10 MG tablet Take 1 tablet (10 mg total) by mouth daily. 90 tablet 3  . hydrOXYzine (ATARAX/VISTARIL) 25 MG tablet Take 1 tablet (25 mg total) by mouth 3 (three) times daily as needed. For itching 60 tablet 0  . metFORMIN (GLUCOPHAGE) 1000 MG tablet TAKE 1 TABLET BY MOUTH TWICE DAILY WITH MEALS 180 tablet 1  . mometasone (ELOCON) 0.1 % ointment Apply  topically daily. 45 g 3  . traMADol (ULTRAM) 50 MG tablet Take 1 tablet (50 mg total) by mouth every 6 (six) hours as needed. 15 tablet 0   No current facility-administered medications on file prior to visit.   Allergies  Allergen Reactions  . Lipitor [Atorvastatin Calcium] Other (See Comments)    shaking  . Sulfa Antibiotics Other (See Comments)    unknown  . Zocor [Simvastatin - High Dose] Other (See Comments)    shaking   Social History   Socioeconomic History  . Marital status: Married    Spouse name: Not on file  . Number of children: 2  . Years of education: Not on file  . Highest education level: Not on file  Occupational History  . Occupation: Disabled    Employer: Psychologist, occupational  Tobacco Use  . Smoking status: Current Every Day Smoker    Packs/day: 2.00    Types: Cigarettes  . Smokeless tobacco: Never Used  . Tobacco comment: Vaping more then cigs  Substance and Sexual Activity  . Alcohol use: No  . Drug use: No  . Sexual activity: Not on file  Other Topics Concern  . Not on file  Social History Narrative  . Not on file  Social Determinants of Health   Financial Resource Strain:   . Difficulty of Paying Living Expenses: Not on file  Food Insecurity:   . Worried About Charity fundraiser in the Last Year: Not on file  . Ran Out of Food in the Last Year: Not on file  Transportation Needs:   . Lack of Transportation (Medical): Not on file  . Lack of Transportation (Non-Medical): Not on file  Physical Activity:   . Days of Exercise per Week: Not on file  . Minutes of Exercise per Session: Not on file  Stress:   . Feeling of Stress : Not on file  Social Connections:   . Frequency of Communication with Friends and Family: Not on file  . Frequency of Social Gatherings with Friends and Family: Not on file  . Attends Religious Services: Not on file  . Active Member of Clubs or Organizations: Not on file  . Attends Archivist Meetings: Not on  file  . Marital Status: Not on file  Intimate Partner Violence:   . Fear of Current or Ex-Partner: Not on file  . Emotionally Abused: Not on file  . Physically Abused: Not on file  . Sexually Abused: Not on file      Review of Systems  All other systems reviewed and are negative.      Objective:   Physical Exam Vitals reviewed.  Constitutional:      General: He is not in acute distress.    Appearance: Normal appearance. He is obese. He is not ill-appearing or toxic-appearing.  Cardiovascular:     Rate and Rhythm: Normal rate and regular rhythm.     Heart sounds: Normal heart sounds.  Pulmonary:     Effort: Pulmonary effort is normal. No respiratory distress.     Breath sounds: Normal breath sounds. No wheezing, rhonchi or rales.  Musculoskeletal:     Right lower leg: Edema present.     Left lower leg: No edema.  Neurological:     Mental Status: He is alert.           Assessment & Plan:  Right leg swelling - Plan: US Venous Img Lower Unilateral Right  I explained to the patient I am concerned that he has a blood clot.  I have ordered an ultrasound of his leg to evaluate for a DVT stat.  However given the time on Friday I am concerned that there will not be available appointments due to the current COVID-19 pandemic and the reduced number of walk-in slots.  Therefore I have given the patient the option of going to the emergency room for emergent ultrasound to rule out a DVT.  The other option would be to treat the patient empirically with Xarelto 15 mg twice daily as I have a very high index of suspicion that this is a DVT and schedule the ultrasound as soon as possible although it would likely be Monday.  I explained the risk and benefits of both approaches.  Patient is adamant that he does not want to go to the emergency room.  Therefore he would like to treat himself empirically with Xarelto 15 mg twice a day and schedule the ultrasound as soon as possible.  I have placed  order stat.  Patient will continue treatment until we have the results back.  Further treatment will be determined based on the results of the ultrasound.

## 2019-08-11 ENCOUNTER — Other Ambulatory Visit: Payer: Self-pay | Admitting: Family Medicine

## 2019-08-11 MED ORDER — FUROSEMIDE 40 MG PO TABS: 40.0000 mg | ORAL_TABLET | Freq: Every day | ORAL | 3 refills | Status: DC

## 2019-08-22 ENCOUNTER — Other Ambulatory Visit: Payer: Self-pay | Admitting: Family Medicine

## 2019-10-14 ENCOUNTER — Telehealth: Payer: Self-pay | Admitting: Family Medicine

## 2019-10-14 NOTE — Telephone Encounter (Signed)
Pt called and states that the rash is back and would like to have the antibx called out again for like last time.

## 2019-10-14 NOTE — Telephone Encounter (Signed)
I treated him for cellulitis 11/20.  Keflex will treat an infection NOT a rash. Would need to be seen to determine if there is an infection. I would be glad to give him cream (elocon) for the rash.

## 2019-10-15 NOTE — Telephone Encounter (Signed)
Tried to call pt no answer and no vm.

## 2019-10-23 NOTE — Telephone Encounter (Signed)
Tried to call no answer and no vm 

## 2019-10-28 NOTE — Telephone Encounter (Signed)
No return call - closing note 

## 2019-10-30 ENCOUNTER — Other Ambulatory Visit: Payer: Self-pay

## 2019-10-30 ENCOUNTER — Ambulatory Visit (INDEPENDENT_AMBULATORY_CARE_PROVIDER_SITE_OTHER): Payer: Medicare Other | Admitting: Family Medicine

## 2019-10-30 ENCOUNTER — Encounter: Payer: Self-pay | Admitting: Family Medicine

## 2019-10-30 VITALS — BP 140/70 | HR 78 | Temp 98.2°F | Resp 18 | Ht 72.0 in | Wt 270.0 lb

## 2019-10-30 DIAGNOSIS — L2089 Other atopic dermatitis: Secondary | ICD-10-CM | POA: Diagnosis not present

## 2019-10-30 DIAGNOSIS — E118 Type 2 diabetes mellitus with unspecified complications: Secondary | ICD-10-CM | POA: Diagnosis not present

## 2019-10-30 MED ORDER — TRIAMCINOLONE ACETONIDE 0.1 % EX CREA: 1.0000 "application " | TOPICAL_CREAM | Freq: Two times a day (BID) | CUTANEOUS | 2 refills | Status: DC

## 2019-10-30 NOTE — Progress Notes (Signed)
Subjective:    Patient ID: Ricky Rios, male    DOB: March 22, 1951, 69 y.o.   MRN: [id number]  HPI Patient has a history of atopic dermatitis.  Last fall he was treated with secondary cellulitis as well.  The eczema is out of control.  He would like a referral to see a dermatologist.  Today, the patient has an inflamed erythematous pink papulosquamous eruption on the dorsum of both forearms.  This extends from the antecubital fossa down along the dorsum of the forearm and onto the hand.  The skin on the hand has many fissures and cracks in the webspaces and on the extensor surfaces of the PIP and DIP joints.  The skin is flaking and peeling on the hand.  The patient has severe dyshidrotic eczema as well as severe atopic dermatitis on the forearms.  He states that it itches terribly.  He denies any fever or chills.  He denies any other rash anywhere else on his body.  He uses Vaseline on a daily basis.  He also saw temporary benefit from triamcinolone in the past.  He is long overdue for fasting lab work for his diabetes. Past Medical History:  Diagnosis Date  . Chest pain    Diagnostic cardiac cath October 2012. Normal coronary arteries, left ventricular function  . DDD (degenerative disc disease), lumbar   . Diabetes mellitus   . History of back surgery   . History of neck surgery   . HLD (hyperlipidemia) 12/09/2012  . Hypertension   . Obesity   . Right knee injury   . Sleep apnea   . Smoker    Past Surgical History:  Procedure Laterality Date  . APPENDECTOMY    . BACK SURGERY     for ruptured disc lumbar area  . CARDIAC CATHETERIZATION  Oct 2012   nml coronary arteries, aorta, LV fcn  . I & D EXTREMITY Right 02/06/2013   Procedure: IRRIGATION AND DEBRIDEMENT INDEX FINGER;  Surgeon: Tennis Must, MD;  Location: [facility];  Service: Orthopedics;  Laterality: Right;  . NECK SURGERY     for ruptured disc  . TONSILLECTOMY     Current Outpatient Medications on File Prior to Visit    Medication Sig Dispense Refill  . ezetimibe (ZETIA) 10 MG tablet Take 1 tablet (10 mg total) by mouth daily. 90 tablet 3  . furosemide (LASIX) 40 MG tablet Take 1 tablet (40 mg total) by mouth daily. 30 tablet 3  . hydrOXYzine (ATARAX/VISTARIL) 25 MG tablet Take 1 tablet (25 mg total) by mouth 3 (three) times daily as needed. For itching 60 tablet 0  . metFORMIN (GLUCOPHAGE) 1000 MG tablet TAKE 1 TABLET BY MOUTH TWICE DAILY WITH MEALS 180 tablet 1  . mometasone (ELOCON) 0.1 % ointment Apply topically daily. 45 g 3  . traMADol (ULTRAM) 50 MG tablet Take 1 tablet (50 mg total) by mouth every 6 (six) hours as needed. 15 tablet 0   No current facility-administered medications on file prior to visit.   Allergies  Allergen Reactions  . Lipitor [Atorvastatin Calcium] Other (See Comments)    shaking  . Sulfa Antibiotics Other (See Comments)    unknown  . Zocor [Simvastatin - High Dose] Other (See Comments)    shaking   Social History   Socioeconomic History  . Marital status: Married    Spouse name: Not on file  . Number of children: 2  . Years of education: Not on file  . Highest education  level: Not on file  Occupational History  . Occupation: Disabled    Employer: Psychologist, occupational  Tobacco Use  . Smoking status: Current Every Day Smoker    Packs/day: 2.00    Types: Cigarettes  . Smokeless tobacco: Never Used  . Tobacco comment: Vaping more then cigs  Substance and Sexual Activity  . Alcohol use: No  . Drug use: No  . Sexual activity: Not on file  Other Topics Concern  . Not on file  Social History Narrative  . Not on file   Social Determinants of Health   Financial Resource Strain:   . Difficulty of Paying Living Expenses:   Food Insecurity:   . Worried About Charity fundraiser in the Last Year:   . Arboriculturist in the Last Year:   Transportation Needs:   . Film/video editor (Medical):   Marland Kitchen Lack of Transportation (Non-Medical):   Physical Activity:   .  Days of Exercise per Week:   . Minutes of Exercise per Session:   Stress:   . Feeling of Stress :   Social Connections:   . Frequency of Communication with Friends and Family:   . Frequency of Social Gatherings with Friends and Family:   . Attends Religious Services:   . Active Member of Clubs or Organizations:   . Attends Archivist Meetings:   Marland Kitchen Marital Status:   Intimate Partner Violence:   . Fear of Current or Ex-Partner:   . Emotionally Abused:   Marland Kitchen Physically Abused:   . Sexually Abused:      Review of Systems  All other systems reviewed and are negative.      Objective:   Physical Exam Vitals reviewed.  Constitutional:      Appearance: Normal appearance. He is obese.  Cardiovascular:     Rate and Rhythm: Normal rate and regular rhythm.     Pulses: Normal pulses.     Heart sounds: Normal heart sounds. No murmur. No gallop.   Pulmonary:     Effort: Pulmonary effort is normal. No respiratory distress.     Breath sounds: Normal breath sounds. No stridor. No wheezing, rhonchi or rales.  Chest:     Chest wall: No tenderness.  Musculoskeletal:     Right lower leg: No edema.     Left lower leg: No edema.  Skin:    Findings: Erythema and rash present.       Neurological:     Mental Status: He is alert.           Assessment & Plan:  Controlled type 2 diabetes mellitus with complication, without long-term current use of insulin (HCC)  Other atopic dermatitis  I will consult dermatology.  Meanwhile I have recommended using triamcinolone 0.1% cream twice daily to help manage the atopic dermatitis.  He can also use Vaseline as well with this.  Recommended consultation with dermatology to discuss [place].  Return fasting for CBC, CMP, fasting lipid panel, hemoglobin A1c to monitor the management of his diabetes.

## 2019-11-04 ENCOUNTER — Telehealth: Payer: Self-pay | Admitting: Family Medicine

## 2019-11-04 ENCOUNTER — Other Ambulatory Visit: Payer: Self-pay | Admitting: Family Medicine

## 2019-11-04 DIAGNOSIS — L2089 Other atopic dermatitis: Secondary | ICD-10-CM

## 2019-11-04 NOTE — Telephone Encounter (Signed)
Spoke with patient's wife and informed her that it appears that the referral was not ordered, however note from office visit on 10/30/2019 mentioned that a referral would be sent out to dermatology. Informed her that I was placing orders now and would process this referral at this time.

## 2019-11-04 NOTE — Telephone Encounter (Signed)
Pt wife call in reference of a referral to dermatology. [phone]

## 2019-12-02 ENCOUNTER — Other Ambulatory Visit (INDEPENDENT_AMBULATORY_CARE_PROVIDER_SITE_OTHER): Payer: Medicare Other

## 2019-12-02 ENCOUNTER — Encounter: Payer: Self-pay | Admitting: Family Medicine

## 2019-12-02 ENCOUNTER — Other Ambulatory Visit: Payer: Self-pay

## 2019-12-02 DIAGNOSIS — L309 Dermatitis, unspecified: Secondary | ICD-10-CM | POA: Diagnosis not present

## 2019-12-02 DIAGNOSIS — L300 Nummular dermatitis: Secondary | ICD-10-CM | POA: Diagnosis not present

## 2019-12-02 DIAGNOSIS — R21 Rash and other nonspecific skin eruption: Secondary | ICD-10-CM | POA: Diagnosis not present

## 2019-12-02 DIAGNOSIS — B351 Tinea unguium: Secondary | ICD-10-CM | POA: Diagnosis not present

## 2019-12-02 DIAGNOSIS — L308 Other specified dermatitis: Secondary | ICD-10-CM | POA: Diagnosis not present

## 2019-12-02 DIAGNOSIS — E118 Type 2 diabetes mellitus with unspecified complications: Secondary | ICD-10-CM | POA: Diagnosis not present

## 2019-12-02 DIAGNOSIS — I8312 Varicose veins of left lower extremity with inflammation: Secondary | ICD-10-CM | POA: Diagnosis not present

## 2019-12-02 DIAGNOSIS — I8311 Varicose veins of right lower extremity with inflammation: Secondary | ICD-10-CM | POA: Diagnosis not present

## 2019-12-02 DIAGNOSIS — B353 Tinea pedis: Secondary | ICD-10-CM | POA: Diagnosis not present

## 2019-12-02 DIAGNOSIS — C8400 Mycosis fungoides, unspecified site: Secondary | ICD-10-CM | POA: Diagnosis not present

## 2019-12-02 DIAGNOSIS — I872 Venous insufficiency (chronic) (peripheral): Secondary | ICD-10-CM | POA: Diagnosis not present

## 2019-12-03 LAB — CBC WITH DIFFERENTIAL/PLATELET
Absolute Monocytes: 462 cells/uL (ref 200–950)
Basophils Absolute: 92 cells/uL (ref 0–200)
Basophils Relative: 1.3 %
Eosinophils Absolute: 412 cells/uL (ref 15–500)
Eosinophils Relative: 5.8 %
HCT: 43 % (ref 38.5–50.0)
Hemoglobin: 14.3 g/dL (ref 13.2–17.1)
Lymphs Abs: 2428 cells/uL (ref 850–3900)
MCH: 30 pg (ref 27.0–33.0)
MCHC: 33.3 g/dL (ref 32.0–36.0)
MCV: 90.1 fL (ref 80.0–100.0)
MPV: 11.4 fL (ref 7.5–12.5)
Monocytes Relative: 6.5 %
Neutro Abs: 3706 cells/uL (ref 1500–7800)
Neutrophils Relative %: 52.2 %
Platelets: 89 10*3/uL — ABNORMAL LOW (ref 140–400)
RBC: 4.77 10*6/uL (ref 4.20–5.80)
RDW: 14.4 % (ref 11.0–15.0)
Total Lymphocyte: 34.2 %
WBC: 7.1 10*3/uL (ref 3.8–10.8)

## 2019-12-03 LAB — COMPLETE METABOLIC PANEL WITH GFR
AG Ratio: 1.2 (calc) (ref 1.0–2.5)
ALT: 22 U/L (ref 9–46)
AST: 38 U/L — ABNORMAL HIGH (ref 10–35)
Albumin: 3.9 g/dL (ref 3.6–5.1)
Alkaline phosphatase (APISO): 98 U/L (ref 35–144)
BUN: 16 mg/dL (ref 7–25)
CO2: 22 mmol/L (ref 20–32)
Calcium: 9.5 mg/dL (ref 8.6–10.3)
Chloride: 103 mmol/L (ref 98–110)
Creat: 1.04 mg/dL (ref 0.70–1.25)
GFR, Est African American: 86 mL/min/{1.73_m2} (ref >=60)
GFR, Est Non African American: 74 mL/min/{1.73_m2} (ref >=60)
Globulin: 3.2 g/dL (calc) (ref 1.9–3.7)
Glucose, Bld: 245 mg/dL — ABNORMAL HIGH (ref 65–99)
Potassium: 3.6 mmol/L (ref 3.5–5.3)
Sodium: 138 mmol/L (ref 135–146)
Total Bilirubin: 0.8 mg/dL (ref 0.2–1.2)
Total Protein: 7.1 g/dL (ref 6.1–8.1)

## 2019-12-03 LAB — LIPID PANEL
Cholesterol: 151 mg/dL (ref <200)
HDL: 30 mg/dL — ABNORMAL LOW (ref >=40)
LDL Cholesterol (Calc): 86 mg/dL (calc)
Non-HDL Cholesterol (Calc): 121 mg/dL (calc) (ref <130)
Total CHOL/HDL Ratio: 5 (calc) — ABNORMAL HIGH (ref <5.0)
Triglycerides: 263 mg/dL — ABNORMAL HIGH (ref <150)

## 2019-12-03 LAB — HEMOGLOBIN A1C
Hgb A1c MFr Bld: 6.5 % of total Hgb — ABNORMAL HIGH (ref <5.7)
Mean Plasma Glucose: 140 (calc)
eAG (mmol/L): 7.7 (calc)

## 2019-12-03 LAB — MICROALBUMIN, URINE: Microalb, Ur: 29.6 mg/dL

## 2019-12-04 ENCOUNTER — Other Ambulatory Visit: Payer: Self-pay | Admitting: Family Medicine

## 2019-12-04 DIAGNOSIS — D696 Thrombocytopenia, unspecified: Secondary | ICD-10-CM

## 2019-12-04 DIAGNOSIS — D691 Qualitative platelet defects: Secondary | ICD-10-CM

## 2019-12-05 ENCOUNTER — Telehealth: Payer: Self-pay | Admitting: Hematology and Oncology

## 2019-12-05 NOTE — Telephone Encounter (Signed)
Received a new hem referral from Dr Dennard Schaumann for abnl platelets. Pt has been scheduled to see Dr. Lindi Adie on 5/19 at 3:45pm. Appt date and time has been given to the pt's wife. Aware to arrive 15 minutes early.

## 2019-12-09 DIAGNOSIS — D696 Thrombocytopenia, unspecified: Secondary | ICD-10-CM | POA: Insufficient documentation

## 2019-12-09 NOTE — Assessment & Plan Note (Signed)
Mild thrombocytopenia:   12/02/2019: Platelet count 89 06/28/2017: Platelet count 111 (MPV 12.9) 04/28/2015: Platelets 135 07/07/2014: Platelets 153 01/31/2014: Platelets 118  Differential diagnosis: 1. Low-grade ITP 2. medication induced: On further review patient is not taking any medications that are associated with thrombocytopenia 3. Bone marrow factors 4. Hepatitis B/C 5. Splenomegaly: No enlarged spleen was palpable to physical exam. 6.  Spurious thrombocytopenia due to clumping  I discussed with the patient that the level of thrombocytopenia is very mild and that these levels, there are usually no adverse effects. There is usually no risk of bleeding and hence it can be observed without making any changes to patient's medications or requiring any further investigations like bone marrow biopsies.  Return to clinic in 1 week to review the results of the blood work and ultrasound.

## 2019-12-09 NOTE — Progress Notes (Signed)
[facility] CONSULT NOTE  Patient Care Team: Susy Frizzle, MD as PCP - General (Family Medicine)  CHIEF COMPLAINTS/PURPOSE OF CONSULTATION:  Newly diagnosed thrombocytopenia  HISTORY OF PRESENTING ILLNESS:  Ricky Rios 69 y.o. male is here because of recent diagnosis of thrombocytopenia. He is referred by Dr. Dennard Schaumann. Labs on 12/02/19 showed platelets 89.  He presents to the clinic today for initial evaluation.  He was started on Zetia 2 months ago. Hes obese and may have fatty liver.  I reviewed his records extensively and collaborated the history with the patient.  MEDICAL HISTORY:  Past Medical History:  Diagnosis Date  . Chest pain    Diagnostic cardiac cath October 2012. Normal coronary arteries, left ventricular function  . DDD (degenerative disc disease), lumbar   . Diabetes mellitus   . History of back surgery   . History of neck surgery   . HLD (hyperlipidemia) 12/09/2012  . Hypertension   . Obesity   . Right knee injury   . Sleep apnea   . Smoker     SURGICAL HISTORY: Past Surgical History:  Procedure Laterality Date  . APPENDECTOMY    . BACK SURGERY     for ruptured disc lumbar area  . CARDIAC CATHETERIZATION  Oct 2012   nml coronary arteries, aorta, LV fcn  . I & D EXTREMITY Right 02/06/2013   Procedure: IRRIGATION AND DEBRIDEMENT INDEX FINGER;  Surgeon: Tennis Must, MD;  Location: [facility];  Service: Orthopedics;  Laterality: Right;  . NECK SURGERY     for ruptured disc  . TONSILLECTOMY      SOCIAL HISTORY: Social History   Socioeconomic History  . Marital status: Married    Spouse name: Not on file  . Number of children: 2  . Years of education: Not on file  . Highest education level: Not on file  Occupational History  . Occupation: Disabled    Employer: Psychologist, occupational  Tobacco Use  . Smoking status: Current Every Day Smoker    Packs/day: 2.00    Types: Cigarettes  . Smokeless tobacco: Never Used  . Tobacco  comment: Vaping more then cigs  Substance and Sexual Activity  . Alcohol use: No  . Drug use: No  . Sexual activity: Not on file  Other Topics Concern  . Not on file  Social History Narrative  . Not on file   Social Determinants of Health   Financial Resource Strain:   . Difficulty of Paying Living Expenses:   Food Insecurity:   . Worried About Charity fundraiser in the Last Year:   . Arboriculturist in the Last Year:   Transportation Needs:   . Film/video editor (Medical):   Marland Kitchen Lack of Transportation (Non-Medical):   Physical Activity:   . Days of Exercise per Week:   . Minutes of Exercise per Session:   Stress:   . Feeling of Stress :   Social Connections:   . Frequency of Communication with Friends and Family:   . Frequency of Social Gatherings with Friends and Family:   . Attends Religious Services:   . Active Member of Clubs or Organizations:   . Attends Archivist Meetings:   Marland Kitchen Marital Status:   Intimate Partner Violence:   . Fear of Current or Ex-Partner:   . Emotionally Abused:   Marland Kitchen Physically Abused:   . Sexually Abused:     FAMILY HISTORY: Family History  Problem Relation Age  of Onset  . Dementia Mother   . Heart disease Father     ALLERGIES:  is allergic to lipitor [atorvastatin calcium]; sulfa antibiotics; and zocor [simvastatin - high dose].  MEDICATIONS:  Current Outpatient Medications  Medication Sig Dispense Refill  . ezetimibe (ZETIA) 10 MG tablet Take 1 tablet (10 mg total) by mouth daily. 90 tablet 3  . furosemide (LASIX) 40 MG tablet Take 1 tablet (40 mg total) by mouth daily. 30 tablet 3  . hydrOXYzine (ATARAX/VISTARIL) 25 MG tablet Take 1 tablet (25 mg total) by mouth 3 (three) times daily as needed. For itching 60 tablet 0  . metFORMIN (GLUCOPHAGE) 1000 MG tablet TAKE 1 TABLET BY MOUTH TWICE DAILY WITH MEALS 180 tablet 1  . mometasone (ELOCON) 0.1 % ointment Apply topically daily. 45 g 3  . traMADol (ULTRAM) 50 MG tablet  Take 1 tablet (50 mg total) by mouth every 6 (six) hours as needed. 15 tablet 0  . triamcinolone cream (KENALOG) 0.1 % Apply 1 application topically 2 (two) times daily. 453 g 2   No current facility-administered medications for this visit.    REVIEW OF SYSTEMS:   Constitutional: Denies fevers, chills or abnormal night sweats Eyes: Denies blurriness of vision, double vision or watery eyes Ears, nose, mouth, throat, and face: Denies mucositis or sore throat Respiratory: Denies cough, dyspnea or wheezes Cardiovascular: Denies palpitation, chest discomfort or lower extremity swelling Gastrointestinal:  Denies nausea, heartburn or change in bowel habits Skin: Denies abnormal skin rashes Lymphatics: Denies new lymphadenopathy or easy bruising Neurological:Denies numbness, tingling or new weaknesses Behavioral/Psych: Mood is stable, no new changes  All other systems were reviewed with the patient and are negative.  PHYSICAL EXAMINATION: ECOG PERFORMANCE STATUS: 0 - Asymptomatic  Vitals:   12/10/19 1559  BP: 138/61  Pulse: 73  Resp: 17  Temp: 98.5 F (36.9 C)  SpO2: 97%   Filed Weights   12/10/19 1559  Weight: 265 lb 14.4 oz (120.6 kg)    GENERAL:alert, no distress and comfortable SKIN: skin color, texture, turgor are normal, no rashes or significant lesions EYES: normal, conjunctiva are pink and non-injected, sclera clear OROPHARYNX:no exudate, no erythema and lips, buccal mucosa, and tongue normal  NECK: supple, thyroid normal size, non-tender, without nodularity LYMPH:  no palpable lymphadenopathy in the cervical, axillary or inguinal LUNGS: clear to auscultation and percussion with normal breathing effort HEART: regular rate & rhythm and no murmurs and no lower extremity edema ABDOMEN:abdomen soft, non-tender and normal bowel sounds Musculoskeletal:no cyanosis of digits and no clubbing  PSYCH: alert & oriented x 3 with fluent speech NEURO: no focal motor/sensory  deficits  LABORATORY DATA:  I have reviewed the data as listed Lab Results  Component Value Date   WBC 7.1 12/02/2019   HGB 14.3 12/02/2019   HCT 43.0 12/02/2019   MCV 90.1 12/02/2019   PLT 89 (L) 12/02/2019   Lab Results  Component Value Date   NA 138 12/02/2019   K 3.6 12/02/2019   CL 103 12/02/2019   CO2 22 12/02/2019    RADIOGRAPHIC STUDIES: I have personally reviewed the radiological reports and agreed with the findings in the report.  ASSESSMENT AND PLAN:  Thrombocytopenia ([facility]) Mild thrombocytopenia:   12/02/2019: Platelet count 89 06/28/2017: Platelet count 111 (MPV 12.9) 04/28/2015: Platelets 135 07/07/2014: Platelets 153 01/31/2014: Platelets 118  Differential diagnosis: 1. Low-grade ITP 2. medication induced: Zetia might cause thrombcytopenia 3. Bone marrow factors 4. Hepatitis B/C 5. Splenomegaly: US abdomen 6.  Spurious  thrombocytopenia due to clumping  I discussed with the patient that the level of thrombocytopenia is very mild and that these levels, there are usually no adverse effects. There is usually no risk of bleeding and hence it can be observed without making any changes to patient's medications or requiring any further investigations like bone marrow biopsies.  Return to clinic in 1 week to review the results of the blood work and ultrasound.   All questions were answered. The patient knows to call the clinic with any problems, questions or concerns.   Rulon Eisenmenger, MD, MPH 12/10/2019    I, Molly Dorshimer, am acting as scribe for Nicholas Lose, MD.  I have reviewed the above documentation for accuracy and completeness, and I agree with the above.

## 2019-12-10 ENCOUNTER — Other Ambulatory Visit: Payer: Self-pay

## 2019-12-10 ENCOUNTER — Inpatient Hospital Stay: Payer: Medicare Other

## 2019-12-10 ENCOUNTER — Inpatient Hospital Stay: Payer: Medicare Other | Attending: Hematology and Oncology | Admitting: Hematology and Oncology

## 2019-12-10 DIAGNOSIS — D696 Thrombocytopenia, unspecified: Secondary | ICD-10-CM

## 2019-12-10 DIAGNOSIS — G4730 Sleep apnea, unspecified: Secondary | ICD-10-CM | POA: Diagnosis not present

## 2019-12-10 DIAGNOSIS — E669 Obesity, unspecified: Secondary | ICD-10-CM | POA: Insufficient documentation

## 2019-12-10 DIAGNOSIS — Z79899 Other long term (current) drug therapy: Secondary | ICD-10-CM | POA: Insufficient documentation

## 2019-12-10 DIAGNOSIS — F17210 Nicotine dependence, cigarettes, uncomplicated: Secondary | ICD-10-CM | POA: Diagnosis not present

## 2019-12-10 DIAGNOSIS — R161 Splenomegaly, not elsewhere classified: Secondary | ICD-10-CM | POA: Diagnosis not present

## 2019-12-10 DIAGNOSIS — I10 Essential (primary) hypertension: Secondary | ICD-10-CM | POA: Insufficient documentation

## 2019-12-10 DIAGNOSIS — Z7984 Long term (current) use of oral hypoglycemic drugs: Secondary | ICD-10-CM | POA: Diagnosis not present

## 2019-12-10 DIAGNOSIS — D72119 Hypereosinophilic syndrome (hes), unspecified: Secondary | ICD-10-CM | POA: Diagnosis not present

## 2019-12-10 DIAGNOSIS — D693 Immune thrombocytopenic purpura: Secondary | ICD-10-CM | POA: Insufficient documentation

## 2019-12-10 DIAGNOSIS — E785 Hyperlipidemia, unspecified: Secondary | ICD-10-CM | POA: Insufficient documentation

## 2019-12-10 DIAGNOSIS — E119 Type 2 diabetes mellitus without complications: Secondary | ICD-10-CM | POA: Diagnosis not present

## 2019-12-10 LAB — CBC WITH DIFFERENTIAL (CANCER CENTER ONLY)
Abs Immature Granulocytes: 0.04 10*3/uL (ref 0.00–0.07)
Basophils Absolute: 0.1 10*3/uL (ref 0.0–0.1)
Basophils Relative: 1 %
Eosinophils Absolute: 0.5 10*3/uL (ref 0.0–0.5)
Eosinophils Relative: 5 %
HCT: 41.9 % (ref 39.0–52.0)
Hemoglobin: 13.9 g/dL (ref 13.0–17.0)
Immature Granulocytes: 0 %
Lymphocytes Relative: 35 %
Lymphs Abs: 3.1 10*3/uL (ref 0.7–4.0)
MCH: 29.6 pg (ref 26.0–34.0)
MCHC: 33.2 g/dL (ref 30.0–36.0)
MCV: 89.3 fL (ref 80.0–100.0)
Monocytes Absolute: 0.4 10*3/uL (ref 0.1–1.0)
Monocytes Relative: 5 %
Neutro Abs: 4.8 10*3/uL (ref 1.7–7.7)
Neutrophils Relative %: 54 %
Platelet Count: 103 10*3/uL — ABNORMAL LOW (ref 150–400)
RBC: 4.69 MIL/uL (ref 4.22–5.81)
RDW: 15.3 % (ref 11.5–15.5)
WBC Count: 8.9 10*3/uL (ref 4.0–10.5)
nRBC: 0 % (ref 0.0–0.2)

## 2019-12-10 LAB — CMP (CANCER CENTER ONLY)
ALT: 28 U/L (ref 0–44)
AST: 48 U/L — ABNORMAL HIGH (ref 15–41)
Albumin: 3.7 g/dL (ref 3.5–5.0)
Alkaline Phosphatase: 92 U/L (ref 38–126)
Anion gap: 13 (ref 5–15)
BUN: 17 mg/dL (ref 8–23)
CO2: 21 mmol/L — ABNORMAL LOW (ref 22–32)
Calcium: 9.8 mg/dL (ref 8.9–10.3)
Chloride: 105 mmol/L (ref 98–111)
Creatinine: 1.03 mg/dL (ref 0.61–1.24)
GFR, Est AFR Am: >60 mL/min (ref >60)
GFR, Estimated: >60 mL/min (ref >60)
Glucose, Bld: 114 mg/dL — ABNORMAL HIGH (ref 70–99)
Potassium: 4.1 mmol/L (ref 3.5–5.1)
Sodium: 139 mmol/L (ref 135–145)
Total Bilirubin: 0.9 mg/dL (ref 0.3–1.2)
Total Protein: 7.8 g/dL (ref 6.5–8.1)

## 2019-12-10 LAB — PLATELET BY CITRATE

## 2019-12-10 LAB — VITAMIN B12: Vitamin B-12: 367 pg/mL (ref 180–914)

## 2019-12-16 ENCOUNTER — Other Ambulatory Visit: Payer: Self-pay | Admitting: *Deleted

## 2019-12-16 DIAGNOSIS — D696 Thrombocytopenia, unspecified: Secondary | ICD-10-CM

## 2019-12-16 NOTE — Progress Notes (Signed)
Per MD request orders placed for abdominal ultrasound.  RN will work on [employer] and schedule apt.

## 2019-12-17 ENCOUNTER — Telehealth: Payer: Self-pay | Admitting: *Deleted

## 2019-12-17 ENCOUNTER — Telehealth: Payer: Self-pay | Admitting: Hematology and Oncology

## 2019-12-17 NOTE — Telephone Encounter (Signed)
Per Dr.Gudena, scheduled pt for abdominal ultrasound for 6/1 at 8:30am. Pt called and made aware of appt time and date. Advised pt to arrive 15 min early and nothing to eat or drink after midnight. Pt verbalized understanding

## 2019-12-17 NOTE — Telephone Encounter (Signed)
Scheduled apt per 5/26 sch message - pt aware of appt date and time

## 2019-12-23 ENCOUNTER — Ambulatory Visit (HOSPITAL_COMMUNITY)
Admission: RE | Admit: 2019-12-23 | Discharge: 2019-12-23 | Disposition: A | Discharge status: Home / Self Care | Payer: Medicare Other | Source: Ambulatory Visit | Attending: Hematology and Oncology | Admitting: Hematology and Oncology

## 2019-12-23 ENCOUNTER — Other Ambulatory Visit: Payer: Self-pay

## 2019-12-23 DIAGNOSIS — D696 Thrombocytopenia, unspecified: Secondary | ICD-10-CM | POA: Insufficient documentation

## 2019-12-23 DIAGNOSIS — K8020 Calculus of gallbladder without cholecystitis without obstruction: Secondary | ICD-10-CM | POA: Diagnosis not present

## 2019-12-23 NOTE — Progress Notes (Signed)
Patient Care Team: Susy Frizzle, MD as PCP - General (Family Medicine)  DIAGNOSIS:    ICD-10-CM   1. Thrombocytopenia ([place])  D69.6     CHIEF COMPLIANT: Follow-up of thrombocytopenia  INTERVAL HISTORY: Ricky Rios is a 69 y.o. with above-mentioned history of thrombocytopenia. 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 Labs on 12/10/19 showed platelets 103, B-12 367. He presents to the clinic today for follow-up.    ALLERGIES:  is allergic to lipitor [atorvastatin calcium]; sulfa antibiotics; and zocor [simvastatin - high dose].  MEDICATIONS:  Current Outpatient Medications  Medication Sig Dispense Refill  . ezetimibe (ZETIA) 10 MG tablet Take 1 tablet (10 mg total) by mouth daily. 90 tablet 3  . furosemide (LASIX) 40 MG tablet Take 1 tablet (40 mg total) by mouth daily. 30 tablet 3  . hydrOXYzine (ATARAX/VISTARIL) 25 MG tablet Take 1 tablet (25 mg total) by mouth 3 (three) times daily as needed. For itching 60 tablet 0  . metFORMIN (GLUCOPHAGE) 1000 MG tablet TAKE 1 TABLET BY MOUTH TWICE DAILY WITH MEALS 180 tablet 1  . mometasone (ELOCON) 0.1 % ointment Apply topically daily. 45 g 3  . traMADol (ULTRAM) 50 MG tablet Take 1 tablet (50 mg total) by mouth every 6 (six) hours as needed. 15 tablet 0  . triamcinolone cream (KENALOG) 0.1 % Apply 1 application topically 2 (two) times daily. 453 g 2   No current facility-administered medications for this visit.    PHYSICAL EXAMINATION: ECOG PERFORMANCE STATUS: 1 - Symptomatic but completely ambulatory  There were no vitals filed for this visit. There were no vitals filed for this visit.  LABORATORY DATA:  I have reviewed the data as listed CMP Latest Ref Rng & Units 12/10/2019 12/02/2019 11/29/2018  Glucose 70 - 99 mg/dL 114(H) 245(H) 111(H)  BUN 8 - 23 mg/dL 17 16 11   Creatinine 0.61 - 1.24 mg/dL 1.03 1.04 0.91  Sodium 135 - 145 mmol/L 139 138 138  Potassium 3.5 - 5.1 mmol/L 4.1  3.6 3.9  Chloride 98 - 111 mmol/L 105 103 105  CO2 22 - 32 mmol/L 21(L) 22 22  Calcium 8.9 - 10.3 mg/dL 9.8 9.5 9.7  Total Protein 6.5 - 8.1 g/dL 7.8 7.1 7.4  Total Bilirubin 0.3 - 1.2 mg/dL 0.9 0.8 0.8  Alkaline Phos 38 - 126 U/L 92 - -  AST 15 - 41 U/L 48(H) 38(H) 45(H)  ALT 0 - 44 U/L 28 22 28     Lab Results  Component Value Date   WBC 8.9 12/10/2019   HGB 13.9 12/10/2019   HCT 41.9 12/10/2019   MCV 89.3 12/10/2019   PLT 103 (L) 12/10/2019   NEUTROABS 4.8 12/10/2019    ASSESSMENT & PLAN:  Thrombocytopenia ([facility]) Mild thrombocytopenia:   12/10/2019: Platelet count 103 12/02/2019: Platelet count 89 06/28/2017: Platelet count 111 (MPV 12.9) 04/28/2015: Platelets 135 07/07/2014: Platelets 153 01/31/2014: Platelets 118 Ultrasound liver and spleen: Cirrhosis of the liver with splenomegaly Differential diagnosis most likely is related to cirrhosis of the liver with splenomegaly  B12: 367: Normal There is no indication to treat. I discussed with him that if his platelets drop below 50 we will need to see him. Return to clinic on an as-needed basis.    No orders of the defined types were placed in this encounter.  The patient has a good understanding of the overall plan. he agrees with it. he will call with any problems that may develop before  the next visit here.  Total time spent: 30 mins including face to face time and time spent for planning, charting and coordination of care  Nicholas Lose, MD 12/24/2019  I, Cloyde Reams Dorshimer, am acting as scribe for Dr. Nicholas Lose.  I have reviewed the above documentation for accuracy and completeness, and I agree with the above.

## 2019-12-24 ENCOUNTER — Other Ambulatory Visit: Payer: Self-pay

## 2019-12-24 ENCOUNTER — Inpatient Hospital Stay: Payer: Medicare Other | Attending: Hematology and Oncology | Admitting: Hematology and Oncology

## 2019-12-24 DIAGNOSIS — Z79899 Other long term (current) drug therapy: Secondary | ICD-10-CM | POA: Insufficient documentation

## 2019-12-24 DIAGNOSIS — R161 Splenomegaly, not elsewhere classified: Secondary | ICD-10-CM | POA: Diagnosis not present

## 2019-12-24 DIAGNOSIS — Z7984 Long term (current) use of oral hypoglycemic drugs: Secondary | ICD-10-CM | POA: Insufficient documentation

## 2019-12-24 DIAGNOSIS — K7460 Unspecified cirrhosis of liver: Secondary | ICD-10-CM | POA: Insufficient documentation

## 2019-12-24 DIAGNOSIS — D696 Thrombocytopenia, unspecified: Secondary | ICD-10-CM | POA: Diagnosis not present

## 2019-12-24 NOTE — Assessment & Plan Note (Signed)
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  B12: 367: Normal There is no indication to treat. I discussed with him that if his platelets drop below 50 we will need to see him. Return to clinic on an as-needed basis.

## 2020-01-12 ENCOUNTER — Other Ambulatory Visit: Payer: Self-pay | Admitting: Family Medicine

## 2020-01-12 ENCOUNTER — Telehealth: Payer: Self-pay | Admitting: Family Medicine

## 2020-01-12 NOTE — Progress Notes (Signed)
  Chronic Care Management   Note  01/12/2020 Name: Ricky Rios MRN: [id number] DOB: [birth year]  Ricky Rios is a 69 y.o. year old male who is a primary care patient of Susy Frizzle, MD. I reached out to [name] by phone today in response to a referral sent by Ricky Rios PCP, Susy Frizzle, MD.   [name] was given information about Chronic Care Management services today including:  1. CCM service includes personalized support from designated clinical staff supervised by his physician, including individualized plan of care and coordination with other care providers 2. 24/7 contact phone numbers for assistance for urgent and routine care needs. 3. Service will only be billed when office clinical staff spend 20 minutes or more in a month to coordinate care. 4. Only one practitioner may furnish and bill the service in a calendar month. 5. The patient may stop CCM services at any time (effective at the end of the month) by phone call to the office staff.   Patient agreed to services and verbal consent obtained.   Follow up plan:   [place]

## 2020-03-01 ENCOUNTER — Other Ambulatory Visit: Payer: Self-pay | Admitting: Family Medicine

## 2020-03-01 NOTE — Chronic Care Management (AMB) (Addendum)
Chronic Care Management Pharmacy  Name: Ricky Rios  MRN: [id number] DOB: 12/30/1950  Chief Complaint/ HPI  Ricky Rios,  69 y.o. , male presents for their Initial CCM visit with the clinical pharmacist In office.  PCP : Susy Frizzle, MD  Their chronic conditions include: hypertension, diabetes, hyperlipidemia, tobacco abuse.  Office Visits:  10/30/2019 Dennard Schaumann) -  Dermatology consult for suspected dermatitis Overdue for labwork to follow DM, so full workup ordered  Consult Visit: 12/10/2019 Lindi Adie, Oncology) - thrombocytopenia Mild case, usually no adverse effects and less incidence of bleeding  12/24/2019 Lindi Adie) Platelets not at treatable level, Once they drop below 50 then this would be time to treat. Medications: Outpatient Encounter Medications as of 03/03/2020  Medication Sig   ezetimibe (ZETIA) 10 MG tablet Take 1 tablet by mouth daily   furosemide (LASIX) 40 MG tablet Take 1 tablet (40 mg total) by mouth daily.   hydrOXYzine (ATARAX/VISTARIL) 25 MG tablet Take 1 tablet (25 mg total) by mouth 3 (three) times daily as needed. For itching   metFORMIN (GLUCOPHAGE) 1000 MG tablet TAKE 1 TABLET BY MOUTH TWICE DAILY WITH MEALS   mometasone (ELOCON) 0.1 % ointment Apply topically daily.   triamcinolone cream (KENALOG) 0.1 % Apply 1 application topically 2 (two) times daily.   traMADol (ULTRAM) 50 MG tablet Take 1 tablet (50 mg total) by mouth every 6 (six) hours as needed. (Patient not taking: Reported on 03/03/2020)   [DISCONTINUED] ezetimibe (ZETIA) 10 MG tablet Take 1 tablet by mouth daily   No facility-administered encounter medications on file as of 03/03/2020.     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    Difficulty of Paying Living Expenses: Not very hard    Goals Addressed             This Visit's Progress    Pharmacy Care Plan:       CARE PLAN ENTRY (see longitudinal plan of care for additional care  plan information)  Current Barriers:  Chronic Disease Management support, education, and care coordination needs related to Hypertension, Hyperlipidemia, and Diabetes   Hypertension BP Readings from Last 3 Encounters:  12/24/19 (!) 140/58  12/10/19 138/61  10/30/19 140/70   Pharmacist Clinical Goal(s): Over the next 180 days, patient will work with PharmD and providers to maintain BP goal <140/90 Current regimen:  No medication Interventions: Discussed dietary recommendations Recommended chair exercises Patient self care activities - Over the next 180 days, patient will: Check BP as needed, document, and provide at future appointments Ensure daily salt intake < 2300 mg/day  Hyperlipidemia Lab Results  Component Value Date/Time   LDLCALC 86 12/02/2019 12:02 PM   Pharmacist Clinical Goal(s): Over the next 180 days, patient will work with PharmD and providers to achieve LDL goal < 70, TG < 150 Current regimen:  Ezetimibe 22m daily Interventions: Discussed dietary modifications  Counseled on chair exercises Patient self care activities - Over the next 180 days, patient will: Focus on limiting high fat and sugar containing foods and beverages from diet Focus on medication adherence by pill count  Diabetes Lab Results  Component Value Date/Time   HGBA1C 6.5 (H) 12/02/2019 12:02 PM   HGBA1C 5.4 11/29/2018 12:05 PM   HGBA1C 11.8 (H) 10/19/2014 11:03 AM   Pharmacist Clinical Goal(s): Over the next 180 days, patient will work with PharmD and providers to maintain A1c goal <7% Current regimen:  Metformin 1000 mg twice daily Interventions: Discussed home blood sugar testing  Counseled on diabetic diet Reviewed signs and symptoms of hypoglycemia Patient self care activities - Over the next 180 days, patient will: Check blood sugar as needed, document, and provide at future appointments Contact provider with any episodes of hypoglycemia Focus on cutting back on regular sodas  and limiting servings of carbohydrates to one per meal  Initial goal documentation         Tobacco    Tobacco Status:  Social History   Tobacco Use  Smoking Status Current Every Day Smoker   Packs/day: 2.00   Types: Cigarettes  Smokeless Tobacco Never Used  Tobacco Comment   Vaping more then cigs    We discussed:  Patient smokes and vapes.  He is not ready to quit at this time.  Plan  Continue to counsel on smoking cessation.  Diabetes   Recent Relevant Labs: Lab Results  Component Value Date/Time   HGBA1C 6.5 (H) 12/02/2019 12:02 PM   HGBA1C 5.4 11/29/2018 12:05 PM   HGBA1C 11.8 (H) 10/19/2014 11:03 AM   MICROALBUR 29.6 12/02/2019 12:02 PM     Checking BG: Never   Patient is currently controlled on the following medications: Metformin 1057m twice daily  Last diabetic Foot exam:  Lab Results  Component Value Date/Time   HMDIABEYEEXA No Retinopathy 01/06/2019 12:00 AM    Last diabetic Eye exam: No results found for: HMDIABFOOTEX   Does not check blood sugar at home.  Denies symptoms of hypoglycemia, A1c went up to 6.5 in May.  Did admit to drinking 2-3 large servings of a soda daily with 20g of sugar in each.  Also reports his diet is not very strict at all.  Counseled on diabetic diet including carbohydrate limiting and cutting back on regular sodas.  Plan  Continue current medications, work on cutting back on carbohydrates and regular soda.  Recommend repeat A1c in 3 months.  Hypertension   Office blood pressures are  BP Readings from Last 3 Encounters:  12/24/19 (!) 140/58  12/10/19 138/61  10/30/19 140/70    Patient has failed these meds in the past: none noted  Patient checks BP at home  never   Patient is currently controlled on the following medications:  None  Patient not monitoring at home currently.  BP in office fairly well controlled.  No exercise currently due to back pain.  Used to love walking but now it hurts.  Discussed  alternative options for exercise, daily salt intake < 2300.  Plan  Provide patient with chair exercises and DASH diet information.   Hyperlipidemia   LDL goal < 70  Lipid Panel     Component Value Date/Time   CHOL 151 12/02/2019 1202   TRIG 263 (H) 12/02/2019 1202   HDL 30 (L) 12/02/2019 1202   LDLCALC 86 12/02/2019 1202    Hepatic Function Latest Ref Rng & Units 12/10/2019 12/02/2019 11/29/2018  Total Protein 6.5 - 8.1 g/dL 7.8 7.1 7.4  Albumin 3.5 - 5.0 g/dL 3.7 - -  AST 15 - 41 U/L 48(H) 38(H) 45(H)  ALT 0 - 44 U/L '28 22 28  ' Alk Phosphatase 38 - 126 U/L 92 - -  Total Bilirubin 0.3 - 1.2 mg/dL 0.9 0.8 0.8  Bilirubin, Direct 0.0 - 0.3 mg/dL - - -     The 10-year ASCVD risk score (Mikey BussingDC Jr., et al., 2013) is: 46.5%   Values used to calculate the score:     Age: [age]years     Sex: Male  Is Non-Hispanic African American: No     Diabetic: Yes     Tobacco smoker: Yes     Systolic Blood Pressure: 080 mmHg     Is BP treated: Yes     HDL Cholesterol: 30 mg/dL     Total Cholesterol: 151 mg/dL   Patient has failed these meds in past: statins (itching)  Patient is currently uncontrolled on the following medications:  Ezetimibe 75m  TG elevated, patient cannot tolerate statins.  Discussed dietary modifications that should help lower TGs.  Reports adherence to medication.  Plan  Continue current medications, work on diet lower in fats and sugars.  Vaccines   Reviewed and discussed patient's vaccination history.    Immunization History  Administered Date(s) Administered   Fluad Quad(high Dose 65+) 06/13/2019   Tdap 11/22/2011    Plan  Recommended patient receive COVID-19 and Pneumovax-23 vaccine in office.  Medication Management   Miscellaneous medications:  Furosemide 49mHydroxyzine 2526mrn OTC's: Tylenol prn Patient currently uses [facility]process development scientistPhone #  [phone] reports using no specific method to organize medications and promote  adherence. Patient denies missed doses of medication.   ChrBeverly MilchharmD Clinical Pharmacist [phone] have collaborated with the care management provider regarding care management and care coordination activities outlined in this encounter and have reviewed this encounter including documentation in the note and care plan. I am certifying that I agree with the content of this note and encounter as supervising physician.

## 2020-03-03 ENCOUNTER — Ambulatory Visit: Payer: Medicare Other | Admitting: Pharmacist

## 2020-03-03 ENCOUNTER — Other Ambulatory Visit: Payer: Self-pay

## 2020-03-03 DIAGNOSIS — E785 Hyperlipidemia, unspecified: Secondary | ICD-10-CM

## 2020-03-03 DIAGNOSIS — E118 Type 2 diabetes mellitus with unspecified complications: Secondary | ICD-10-CM

## 2020-03-03 DIAGNOSIS — I10 Essential (primary) hypertension: Secondary | ICD-10-CM

## 2020-03-03 NOTE — Patient Instructions (Addendum)
Visit Information Thank you for meeting with me today!  I look forward to working with you to help you meet all of your healthcare goals and answer any questions you may have.  Feel free to contact me anytime!  Goals Addressed            This Visit's Progress   . Pharmacy Care Plan:       CARE PLAN ENTRY (see longitudinal plan of care for additional care plan information)  Current Barriers:  . Chronic Disease Management support, education, and care coordination needs related to Hypertension, Hyperlipidemia, and Diabetes   Hypertension BP Readings from Last 3 Encounters:  12/24/19 (!) 140/58  12/10/19 138/61  10/30/19 140/70   . Pharmacist Clinical Goal(s): o Over the next 180 days, patient will work with PharmD and providers to maintain BP goal <140/90 . Current regimen:  o No medication . Interventions: o Discussed dietary recommendations o Recommended chair exercises . Patient self care activities - Over the next 180 days, patient will: o Check BP as needed, document, and provide at future appointments o Ensure daily salt intake < 2300 mg/day  Hyperlipidemia Lab Results  Component Value Date/Time   LDLCALC 86 12/02/2019 12:02 PM   . Pharmacist Clinical Goal(s): o Over the next 180 days, patient will work with PharmD and providers to achieve LDL goal < 70, TG < 150 . Current regimen:  o Ezetimibe 68m daily . Interventions: o Discussed dietary modifications  o Counseled on chair exercises . Patient self care activities - Over the next 180 days, patient will: o Focus on limiting high fat and sugar containing foods and beverages from diet o Focus on medication adherence by pill count  Diabetes Lab Results  Component Value Date/Time   HGBA1C 6.5 (H) 12/02/2019 12:02 PM   HGBA1C 5.4 11/29/2018 12:05 PM   HGBA1C 11.8 (H) 10/19/2014 11:03 AM   . Pharmacist Clinical Goal(s): o Over the next 180 days, patient will work with PharmD and providers to maintain A1c goal  <7% . Current regimen:  o Metformin 1000 mg twice daily . Interventions: o Discussed home blood sugar testing o Counseled on diabetic diet o Reviewed signs and symptoms of hypoglycemia . Patient self care activities - Over the next 180 days, patient will: o Check blood sugar as needed, document, and provide at future appointments o Contact provider with any episodes of hypoglycemia o Focus on cutting back on regular sodas and limiting servings of carbohydrates to one per meal  Initial goal documentation        Ricky Rios given information about Chronic Care Management services today including:  1. CCM service includes personalized support from designated clinical staff supervised by his physician, including individualized plan of care and coordination with other care providers 2. 24/7 contact phone numbers for assistance for urgent and routine care needs. 3. Standard insurance, coinsurance, copays and deductibles apply for chronic care management only during months in which we provide at least 20 minutes of these services. Most insurances cover these services at 100%, however patients may be responsible for any copay, coinsurance and/or deductible if applicable. This service may help you avoid the need for more expensive face-to-face services. 4. Only one practitioner may furnish and bill the service in a calendar month. 5. The patient may stop CCM services at any time (effective at the end of the month) by phone call to the office staff.  Patient agreed to services and verbal consent obtained.   The patient  verbalized understanding of instructions provided today and agreed to receive a mailed copy of patient instruction and/or educational materials. Telephone follow up appointment with pharmacy team member scheduled for: 6 months  Beverly Milch, PharmD Clinical Pharmacist [place] [phone]   Exercises To Do While Sitting  Exercises that you do  while sitting (chair exercises) can give you many of the same benefits as full exercise. Benefits include strengthening your heart, burning calories, and keeping muscles and joints healthy. Exercise can also improve your mood and help with depression and anxiety. You may benefit from chair exercises if you are unable to do standing exercises because of:  Diabetic foot pain.  Obesity.  Illness.  Arthritis.  Recovery from surgery or injury.  Breathing problems.  Balance problems.  Another type of disability. Before starting chair exercises, check with your health care provider or a physical therapist to find out how much exercise you can tolerate and which exercises are safe for you. If your health care provider approves:  Start out slowly and build up over time. Aim to work up to about 10-20 minutes for each exercise session.  Make exercise part of your daily routine.  Drink water when you exercise. Do not wait until you are thirsty. Drink every 10-15 minutes.  Stop exercising right away if you have pain, nausea, shortness of breath, or dizziness.  If you are exercising in a wheelchair, make sure to lock the wheels.  Ask your health care provider whether you can do tai chi or yoga. Many positions in these mind-body exercises can be modified to do while seated. Warm-up Before starting other exercises: 1. Sit up as straight as you can. Have your knees bent at 90 degrees, which is the shape of the capital letter "L." Keep your feet flat on the floor. 2. Sit at the front edge of your chair, if you can. 3. Pull in (tighten) the muscles in your abdomen and stretch your spine and neck as straight as you can. Hold this position for a few minutes. 4. Breathe in and out evenly. Try to concentrate on your breathing, and relax your mind. Stretching Exercise A: Arm stretch 1. Hold your arms out straight in front of your body. 2. Bend your hands at the wrist with your fingers pointing up, as  if signaling someone to stop. Notice the slight tension in your forearms as you hold the position. 3. Keeping your arms out and your hands bent, rotate your hands outward as far as you can and hold this stretch. Aim to have your thumbs pointing up and your pinkie fingers pointing down. Slowly repeat arm stretches for one minute as tolerated. Exercise B: Leg stretch 1. If you can move your legs, try to "draw" letters on the floor with the toes of your foot. Write your name with one foot. 2. Write your name with the toes of your other foot. Slowly repeat the movements for one minute as tolerated. Exercise C: Reach for the sky 1. Reach your hands as far over your head as you can to stretch your spine. 2. Move your hands and arms as if you are climbing a rope. Slowly repeat the movements for one minute as tolerated. Range of motion exercises Exercise A: Shoulder roll 1. Let your arms hang loosely at your sides. 2. Lift just your shoulders up toward your ears, then let them relax back down. 3. When your shoulders feel loose, rotate your shoulders in backward and forward circles. Do  shoulder rolls slowly for one minute as tolerated. Exercise B: March in place 1. As if you are marching, pump your arms and lift your legs up and down. Lift your knees as high as you can. ? If you are unable to lift your knees, just pump your arms and move your ankles and feet up and down. March in place for one minute as tolerated. Exercise C: Seated jumping jacks 1. Let your arms hang down straight. 2. Keeping your arms straight, lift them up over your head. Aim to point your fingers to the ceiling. 3. While you lift your arms, straighten your legs and slide your heels along the floor to your sides, as wide as you can. 4. As you bring your arms back down to your sides, slide your legs back together. ? If you are unable to use your legs, just move your arms. Slowly repeat seated jumping jacks for one minute as  tolerated. Strengthening exercises Exercise A: Shoulder squeeze 1. Hold your arms straight out from your body to your sides, with your elbows bent and your fists pointed at the ceiling. 2. Keeping your arms in the bent position, move them forward so your elbows and forearms meet in front of your face. 3. Open your arms back out as wide as you can with your elbows still bent, until you feel your shoulder blades squeezing together. Hold for 5 seconds. Slowly repeat the movements forward and backward for one minute as tolerated. Contact a health care provider if you:  Had to stop exercising due to any of the following: ? Pain. ? Nausea. ? Shortness of breath. ? Dizziness. ? Fatigue.  Have significant pain or soreness after exercising. Get help right away if you have:  Chest pain.  Difficulty breathing. These symptoms may represent a serious problem that is an emergency. Do not wait to see if the symptoms will go away. Get medical help right away. Call your local emergency services (911 in the [place] Do not drive yourself to the hospital. This information is not intended to replace advice given to you by your health care provider. Make sure you discuss any questions you have with your health care provider. Document Revised: 10/31/2018 Document Reviewed: 05/23/2017 Elsevier Patient Education  [employer] DASH stands for "Dietary Approaches to Stop Hypertension." The DASH eating plan is a healthy eating plan that has been shown to reduce high blood pressure (hypertension). It may also reduce your risk for type 2 diabetes, heart disease, and stroke. The DASH eating plan may also help with weight loss. What are tips for following this plan?  General guidelines  Avoid eating more than 2,300 mg (milligrams) of salt (sodium) a day. If you have hypertension, you may need to reduce your sodium intake to 1,500 mg a day.  Limit alcohol intake to no more than 1 drink a  day for nonpregnant women and 2 drinks a day for men. One drink equals 12 oz of beer, 5 oz of wine, or 1 oz of hard liquor.  Work with your health care provider to maintain a healthy body weight or to lose weight. Ask what an ideal weight is for you.  Get at least 30 minutes of exercise that causes your heart to beat faster (aerobic exercise) most days of the week. Activities may include walking, swimming, or biking.  Work with your health care provider or diet and nutrition specialist (dietitian) to adjust your eating plan to your individual calorie  needs. Reading food labels   Check food labels for the amount of sodium per serving. Choose foods with less than 5 percent of the Daily Value of sodium. Generally, foods with less than 300 mg of sodium per serving fit into this eating plan.  To find whole grains, look for the word "whole" as the first word in the ingredient list. Shopping  Buy products labeled as "low-sodium" or "no salt added."  Buy fresh foods. Avoid canned foods and premade or frozen meals. Cooking  Avoid adding salt when cooking. Use salt-free seasonings or herbs instead of table salt or sea salt. Check with your health care provider or pharmacist before using salt substitutes.  Do not fry foods. Cook foods using healthy methods such as baking, boiling, grilling, and broiling instead.  Cook with heart-healthy oils, such as olive, canola, soybean, or sunflower oil. Meal planning  Eat a balanced diet that includes: ? 5 or more servings of fruits and vegetables each day. At each meal, try to fill half of your plate with fruits and vegetables. ? Up to 6-8 servings of whole grains each day. ? Less than 6 oz of lean meat, poultry, or fish each day. A 3-oz serving of meat is about the same size as a deck of cards. One egg equals 1 oz. ? 2 servings of low-fat dairy each day. ? A serving of nuts, seeds, or beans 5 times each week. ? Heart-healthy fats. Healthy fats called  Omega-3 fatty acids are found in foods such as flaxseeds and coldwater fish, like sardines, salmon, and mackerel.  Limit how much you eat of the following: ? Canned or prepackaged foods. ? Food that is high in trans fat, such as fried foods. ? Food that is high in saturated fat, such as fatty meat. ? Sweets, desserts, sugary drinks, and other foods with added sugar. ? Full-fat dairy products.  Do not salt foods before eating.  Try to eat at least 2 vegetarian meals each week.  Eat more home-cooked food and less restaurant, buffet, and fast food.  When eating at a restaurant, ask that your food be prepared with less salt or no salt, if possible. What foods are recommended? The items listed may not be a complete list. Talk with your dietitian about what dietary choices are best for you. Grains Whole-grain or whole-wheat bread. Whole-grain or whole-wheat pasta. Brown rice. [place]. Bulgur. Whole-grain and low-sodium cereals. Pita bread. Low-fat, low-sodium crackers. Whole-wheat flour tortillas. Vegetables Fresh or frozen vegetables (raw, steamed, roasted, or grilled). Low-sodium or reduced-sodium tomato and vegetable juice. Low-sodium or reduced-sodium tomato sauce and tomato paste. Low-sodium or reduced-sodium canned vegetables. Fruits All fresh, dried, or frozen fruit. Canned fruit in natural juice (without added sugar). Meat and other protein foods Skinless chicken or [place]. Ground chicken or [place]. Pork with fat trimmed off. Fish and seafood. Egg whites. Dried beans, peas, or lentils. Unsalted nuts, nut butters, and seeds. Unsalted canned beans. Lean cuts of beef with fat trimmed off. Low-sodium, lean deli meat. Dairy Low-fat (1%) or fat-free (skim) milk. Fat-free, low-fat, or reduced-fat cheeses. Nonfat, low-sodium ricotta or cottage cheese. Low-fat or nonfat yogurt. Low-fat, low-sodium cheese. Fats and oils Soft margarine without trans fats. Vegetable oil. Low-fat,  reduced-fat, or light mayonnaise and salad dressings (reduced-sodium). Canola, safflower, olive, soybean, and sunflower oils. Avocado. Seasoning and other foods Herbs. Spices. Seasoning mixes without salt. Unsalted popcorn and pretzels. Fat-free sweets. What foods are not recommended? The items listed may not be a complete  list. Talk with your dietitian about what dietary choices are best for you. Grains Baked goods made with fat, such as croissants, muffins, or some breads. Dry pasta or rice meal packs. Vegetables Creamed or fried vegetables. Vegetables in a cheese sauce. Regular canned vegetables (not low-sodium or reduced-sodium). Regular canned tomato sauce and paste (not low-sodium or reduced-sodium). Regular tomato and vegetable juice (not low-sodium or reduced-sodium). Angie Fava. Olives. Fruits Canned fruit in a light or heavy syrup. Fried fruit. Fruit in cream or butter sauce. Meat and other protein foods Fatty cuts of meat. Ribs. Fried meat. Berniece Salines. Sausage. Bologna and other processed lunch meats. Salami. Fatback. Hotdogs. Bratwurst. Salted nuts and seeds. Canned beans with added salt. Canned or smoked fish. Whole eggs or egg yolks. Chicken or [place] with skin. Dairy Whole or 2% milk, cream, and half-and-half. Whole or full-fat cream cheese. Whole-fat or sweetened yogurt. Full-fat cheese. Nondairy creamers. Whipped toppings. Processed cheese and cheese spreads. Fats and oils Butter. Stick margarine. Lard. Shortening. Ghee. Bacon fat. Tropical oils, such as coconut, palm kernel, or palm oil. Seasoning and other foods Salted popcorn and pretzels. Onion salt, garlic salt, seasoned salt, table salt, and sea salt. Worcestershire sauce. Tartar sauce. Barbecue sauce. Teriyaki sauce. Soy sauce, including reduced-sodium. Steak sauce. Canned and packaged gravies. Fish sauce. Oyster sauce. Cocktail sauce. Horseradish that you find on the shelf. Ketchup. Mustard. Meat flavorings and tenderizers.  Bouillon cubes. Hot sauce and Tabasco sauce. Premade or packaged marinades. Premade or packaged taco seasonings. Relishes. Regular salad dressings. Where to find more information:  National Heart, Lung, and [place]: https://wilson-eaton.com/  American Heart Association: www.heart.org Summary  The DASH eating plan is a healthy eating plan that has been shown to reduce high blood pressure (hypertension). It may also reduce your risk for type 2 diabetes, heart disease, and stroke.  With the DASH eating plan, you should limit salt (sodium) intake to 2,300 mg a day. If you have hypertension, you may need to reduce your sodium intake to 1,500 mg a day.  When on the DASH eating plan, aim to eat more fresh fruits and vegetables, whole grains, lean proteins, low-fat dairy, and heart-healthy fats.  Work with your health care provider or diet and nutrition specialist (dietitian) to adjust your eating plan to your individual calorie needs. This information is not intended to replace advice given to you by your health care provider. Make sure you discuss any questions you have with your health care provider. Document Revised: 06/22/2017 Document Reviewed: 07/03/2016 Elsevier Patient Education  2020 [employer].

## 2020-03-11 ENCOUNTER — Other Ambulatory Visit: Payer: Self-pay | Admitting: *Deleted

## 2020-03-11 DIAGNOSIS — E785 Hyperlipidemia, unspecified: Secondary | ICD-10-CM

## 2020-03-11 DIAGNOSIS — E119 Type 2 diabetes mellitus without complications: Secondary | ICD-10-CM

## 2020-03-11 DIAGNOSIS — I10 Essential (primary) hypertension: Secondary | ICD-10-CM

## 2020-04-21 ENCOUNTER — Other Ambulatory Visit: Payer: Self-pay | Admitting: Family Medicine

## 2020-06-10 ENCOUNTER — Telehealth: Payer: Self-pay | Admitting: Pharmacist

## 2020-06-10 NOTE — Progress Notes (Signed)
    Chronic Care Management Pharmacy Assistant   Name: Ricky Rios  MRN: [id number] DOB: 1951-04-22  Reason for Encounter: Disease State  Patient Questions:  1.  Have you seen any other providers since your last visit? No  2.  Any changes in your medicines or health? No    PCP : Susy Frizzle, MD   Their chronic conditions include: hypertension, diabetes, hyperlipidemia, tobacco abuse.  Office Visits: None since their last CCM visit with the clinical pharmacist on 03-03-2020.  Consults: None since their last CCM visit with the clinical pharmacist on 03-03-2020.   Allergies:   Allergies  Allergen Reactions  . Lipitor [Atorvastatin Calcium] Other (See Comments)    shaking  . Sulfa Antibiotics Other (See Comments)    unknown  . Zocor [Simvastatin - High Dose] Other (See Comments)    shaking    Medications: Outpatient Encounter Medications as of 06/10/2020  Medication Sig  . ezetimibe (ZETIA) 10 MG tablet Take 1 tablet by mouth daily  . furosemide (LASIX) 40 MG tablet Take 1 tablet (40 mg total) by mouth daily.  . hydrOXYzine (ATARAX/VISTARIL) 25 MG tablet Take 1 tablet (25 mg total) by mouth 3 (three) times daily as needed. For itching  . metFORMIN (GLUCOPHAGE) 1000 MG tablet TAKE 1 TABLET BY MOUTH TWICE DAILY WITH MEALS  . mometasone (ELOCON) 0.1 % ointment Apply topically daily.  . traMADol (ULTRAM) 50 MG tablet Take 1 tablet (50 mg total) by mouth every 6 (six) hours as needed. (Patient not taking: Reported on 03/03/2020)  . triamcinolone cream (KENALOG) 0.1 % Apply 1 application topically 2 (two) times daily.   No facility-administered encounter medications on file as of 06/10/2020.    Current Diagnosis: Patient Active Problem List   Diagnosis Date Noted  . Thrombocytopenia ([place]) 12/09/2019  . DDD (degenerative disc disease), lumbar   . HLD (hyperlipidemia) 12/09/2012  . Diabetes mellitus without complication ([place])   . Hypertension   . Smoker      Goals Addressed   None    Contacted the patient for a General Adherence check. Unable to make contact with the patient at this time. Will attempt another Disease State call next month   A second unsuccessful telephone outreach was attempted today. The patient was referred to pharmacist for assistance with care management and care coordination.   Follow-Up:  Pharmacist Review   Fanny Skates, [place] Pharmacist Assistant [phone]

## 2020-06-22 ENCOUNTER — Telehealth: Payer: Self-pay | Admitting: Pharmacist

## 2020-06-22 NOTE — Progress Notes (Signed)
    Chronic Care Management Pharmacy Assistant   Name: KELLIN BARTLING  MRN: [id number] DOB: 10-03-1950  Reason for Encounter: Disease State  Patient Questions:  1.  Have you seen any other providers since your last visit? No  2.  Any changes in your medicines or health? No    PCP : Susy Frizzle, MD   Their chronic conditions include:hypertension, diabetes, hyperlipidemia, tobacco abuse.  Office Visits: None since their last CCM visit with the clinical pharmacist on 03-03-2020.  Consults: None since their last CCM visit with the clinical pharmacist on 03-03-2020  Allergies:   Allergies  Allergen Reactions  . Lipitor [Atorvastatin Calcium] Other (See Comments)    shaking  . Sulfa Antibiotics Other (See Comments)    unknown  . Zocor [Simvastatin - High Dose] Other (See Comments)    shaking    Medications: Outpatient Encounter Medications as of 06/22/2020  Medication Sig  . ezetimibe (ZETIA) 10 MG tablet Take 1 tablet by mouth daily  . furosemide (LASIX) 40 MG tablet Take 1 tablet (40 mg total) by mouth daily.  . hydrOXYzine (ATARAX/VISTARIL) 25 MG tablet Take 1 tablet (25 mg total) by mouth 3 (three) times daily as needed. For itching  . metFORMIN (GLUCOPHAGE) 1000 MG tablet TAKE 1 TABLET BY MOUTH TWICE DAILY WITH MEALS  . mometasone (ELOCON) 0.1 % ointment Apply topically daily.  . traMADol (ULTRAM) 50 MG tablet Take 1 tablet (50 mg total) by mouth every 6 (six) hours as needed. (Patient not taking: Reported on 03/03/2020)  . triamcinolone cream (KENALOG) 0.1 % Apply 1 application topically 2 (two) times daily.   No facility-administered encounter medications on file as of 06/22/2020.    Current Diagnosis: Patient Active Problem List   Diagnosis Date Noted  . Thrombocytopenia ([place]) 12/09/2019  . DDD (degenerative disc disease), lumbar   . HLD (hyperlipidemia) 12/09/2012  . Diabetes mellitus without complication ([place])   . Hypertension   . Smoker     Goals  Addressed   None    Patient returned my call for his General Adherence call. Inquired how the patient was feeling. He stated overall he was well. He did stop taking his lasix due over a month ago due to bedwetting. He stated he was so frustrated with not being able to control his bladder that he just decided to stop it all together. I inquired if he noticed any swelling in his extremities. Patient stated he did have some edema in one of legs, but nothing recently.  Patient shared with me he has lost a lot of weight over the past couple of months. He still has back pain from previous injury. He stated he had trouble standing in line at the pharmacy waiting for his medication with his back pain. I went over the benefits of [facility] with delivery and packaging options. Patient stated interest. Plan to contact him for onboarding the first week of January once his new insurance kicks in with [place].   Follow-Up:  Pharmacist Review   Fanny Skates, [place] Pharmacist Assistant [phone]

## 2020-06-29 ENCOUNTER — Other Ambulatory Visit: Payer: Self-pay

## 2020-06-29 ENCOUNTER — Ambulatory Visit (INDEPENDENT_AMBULATORY_CARE_PROVIDER_SITE_OTHER): Payer: Medicare Other | Admitting: Family Medicine

## 2020-06-29 VITALS — BP 140/70 | HR 76 | Temp 97.9°F | Ht 72.0 in | Wt 259.0 lb

## 2020-06-29 DIAGNOSIS — F17200 Nicotine dependence, unspecified, uncomplicated: Secondary | ICD-10-CM

## 2020-06-29 DIAGNOSIS — I10 Essential (primary) hypertension: Secondary | ICD-10-CM | POA: Diagnosis not present

## 2020-06-29 DIAGNOSIS — K7581 Nonalcoholic steatohepatitis (NASH): Secondary | ICD-10-CM | POA: Diagnosis not present

## 2020-06-29 DIAGNOSIS — E118 Type 2 diabetes mellitus with unspecified complications: Secondary | ICD-10-CM | POA: Diagnosis not present

## 2020-06-29 DIAGNOSIS — Z23 Encounter for immunization: Secondary | ICD-10-CM | POA: Diagnosis not present

## 2020-06-29 DIAGNOSIS — D691 Qualitative platelet defects: Secondary | ICD-10-CM | POA: Diagnosis not present

## 2020-06-29 DIAGNOSIS — Z789 Other specified health status: Secondary | ICD-10-CM | POA: Diagnosis not present

## 2020-06-29 DIAGNOSIS — Z125 Encounter for screening for malignant neoplasm of prostate: Secondary | ICD-10-CM | POA: Diagnosis not present

## 2020-06-29 MED ORDER — TRAMADOL HCL 50 MG PO TABS
50.0000 mg | ORAL_TABLET | Freq: Four times a day (QID) | ORAL | 0 refills | Status: DC | PRN
Start: 2020-06-29 — End: 2020-08-06

## 2020-06-29 NOTE — Progress Notes (Signed)
Subjective:    Patient ID: Ricky Rios, male    DOB: 11-14-1950, 69 y.o.   MRN: [id number]  HPI  Since I last saw the patient, he is starting a new diet.  He has lost about 8 pounds.  He is trying to eat healthier.  He is drastically reduce his consumption of carbohydrates.  He is also trying to cut back on his smoking.  He is due for Covid shot.  He is due for a flu shot.  He is due for Pneumovax 23.  I recommended all 3 today.  Patient is also due for colonoscopy which he declines.  He is due for lung cancer screening with annual CT scan of the lungs.  We discussed this today as well.  He is also due for prostate cancer screening.  He is due for hemoglobin A1c and fasting lab work.  He denies any polyuria polydipsia or blurry vision.  He denies any chest pain or shortness of breath or dyspnea on exertion.  Past Medical History:  Diagnosis Date  . Allergy    Phreesia 03/01/2020  . Arthritis    [facility]reesia 03/01/2020  . Cataract    [facility]reesia 03/01/2020  . Chest pain    Diagnostic cardiac cath October 2012. Normal coronary arteries, left ventricular function  . COPD (chronic obstructive pulmonary disease) ([place])    Phreesia 03/01/2020  . DDD (degenerative disc disease), lumbar   . Diabetes mellitus   . Diabetes mellitus without complication ([place])    Phreesia 03/01/2020  . History of back surgery   . History of neck surgery   . HLD (hyperlipidemia) 12/09/2012  . Hyperlipidemia    Phreesia 03/01/2020  . Hypertension   . Obesity   . Right knee injury   . Sleep apnea   . Smoker    Past Surgical History:  Procedure Laterality Date  . APPENDECTOMY    . BACK SURGERY     for ruptured disc lumbar area  . CARDIAC CATHETERIZATION  Oct 2012   nml coronary arteries, aorta, LV fcn  . EYE SURGERY N/A    Phreesia 03/01/2020  . I & D EXTREMITY Right 02/06/2013   Procedure: IRRIGATION AND DEBRIDEMENT INDEX FINGER;  Surgeon: Tennis Must, MD;  Location: [facility];  Service: Orthopedics;   Laterality: Right;  . NECK SURGERY     for ruptured disc  . SPINE SURGERY N/A    Phreesia 03/01/2020  . TONSILLECTOMY     Current Outpatient Medications on File Prior to Visit  Medication Sig Dispense Refill  . ezetimibe (ZETIA) 10 MG tablet Take 1 tablet by mouth daily 30 tablet 6  . furosemide (LASIX) 40 MG tablet Take 1 tablet (40 mg total) by mouth daily. 30 tablet 3  . hydrOXYzine (ATARAX/VISTARIL) 25 MG tablet Take 1 tablet (25 mg total) by mouth 3 (three) times daily as needed. For itching 60 tablet 0  . metFORMIN (GLUCOPHAGE) 1000 MG tablet TAKE 1 TABLET BY MOUTH TWICE DAILY WITH MEALS 180 tablet 0  . mometasone (ELOCON) 0.1 % ointment Apply topically daily. 45 g 3  . traMADol (ULTRAM) 50 MG tablet Take 1 tablet (50 mg total) by mouth every 6 (six) hours as needed. (Patient not taking: Reported on 03/03/2020) 15 tablet 0  . triamcinolone cream (KENALOG) 0.1 % Apply 1 application topically 2 (two) times daily. 453 g 2   No current facility-administered medications on file prior to visit.   Allergies  Allergen Reactions  . Lipitor [Atorvastatin Calcium]  Other (See Comments)    shaking  . Sulfa Antibiotics Other (See Comments)    unknown  . Zocor [Simvastatin - High Dose] Other (See Comments)    shaking   Social History   Socioeconomic History  . Marital status: Married    Spouse name: Not on file  . Number of children: 2  . Years of education: Not on file  . Highest education level: Not on file  Occupational History  . Occupation: Disabled    Employer: [employer], occupational  Tobacco Use  . Smoking status: Current Every Day Smoker    Packs/day: 2.00    Types: Cigarettes  . Smokeless tobacco: Never Used  . Tobacco comment: Vaping more then cigs  Substance and Sexual Activity  . Alcohol use: No  . Drug use: No  . Sexual activity: Not on file  Other Topics Concern  . Not on file  Social History Narrative  . Not on file   Social Determinants of Health   Financial  Resource Strain: Low Risk   . Difficulty of Paying Living Expenses: Not very hard  Food Insecurity:   . Worried About Charity fundraiser in the Last Year: Not on file  . Ran Out of Food in the Last Year: Not on file  Transportation Needs:   . Lack of Transportation (Medical): Not on file  . Lack of Transportation (Non-Medical): Not on file  Physical Activity:   . Days of Exercise per Week: Not on file  . Minutes of Exercise per Session: Not on file  Stress:   . Feeling of Stress : Not on file  Social Connections:   . Frequency of Communication with Friends and Family: Not on file  . Frequency of Social Gatherings with Friends and Family: Not on file  . Attends Religious Services: Not on file  . Active Member of Clubs or Organizations: Not on file  . Attends Archivist Meetings: Not on file  . Marital Status: Not on file  Intimate Partner Violence:   . Fear of Current or Ex-Partner: Not on file  . Emotionally Abused: Not on file  . Physically Abused: Not on file  . Sexually Abused: Not on file     Review of Systems  All other systems reviewed and are negative.      Objective:   Physical Exam Vitals reviewed.  Constitutional:      Appearance: Normal appearance. He is obese.  Cardiovascular:     Rate and Rhythm: Normal rate and regular rhythm.     Pulses: Normal pulses.     Heart sounds: Normal heart sounds. No murmur heard.  No gallop.   Pulmonary:     Effort: Pulmonary effort is normal. No respiratory distress.     Breath sounds: Normal breath sounds. No stridor. No wheezing, rhonchi or rales.  Chest:     Chest wall: No tenderness.  Musculoskeletal:     Right lower leg: No edema.     Left lower leg: No edema.  Neurological:     Mental Status: He is alert.    Wt Readings from Last 3 Encounters:  06/29/20 259 lb (117.5 kg)  12/24/19 267 lb 4.8 oz (121.2 kg)  12/10/19 265 lb 14.4 oz (120.6 kg)           Assessment & Plan:  Controlled type 2  diabetes mellitus with complication, without long-term current use of insulin ([place]) - Plan: CBC with Differential/Platelet, COMPLETE METABOLIC PANEL WITH GFR, Lipid panel,  Microalbumin, urine, Hemoglobin A1c  Essential hypertension  Abnormal platelets ([facility]) - Plan: CBC with Differential/Platelet  NASH (nonalcoholic steatohepatitis)  Smoker  Statin intolerance  Prostate cancer screening - Plan: PSA  I congratulated the patient on the lifestyle changes in the weight loss.  I encourage this is this is his best option to help manage his diabetes as well as his steatohepatitis.  Check CBC, CMP, fasting lipid panel, A1c, and urine microalbumin.  Goal A1c is less than 6.5.  Goal LDL cholesterol is less than 100.  Continue to encourage smoking cessation.  He declines a colonoscopy.  He declines a annual CT scan of the lung to screen for lung cancer.  He consents to a PSA.  Recommended the Covid shot, flu shot, and Pneumovax 23.  I will refill the tramadol that the patient uses occasionally for low back pain.

## 2020-06-30 LAB — CBC WITH DIFFERENTIAL/PLATELET
Absolute Monocytes: 311 cells/uL (ref 200–950)
Basophils Absolute: 79 cells/uL (ref 0–200)
Basophils Relative: 1.3 %
Eosinophils Absolute: 464 cells/uL (ref 15–500)
Eosinophils Relative: 7.6 %
HCT: 42.3 % (ref 38.5–50.0)
Hemoglobin: 14.5 g/dL (ref 13.2–17.1)
Lymphs Abs: 2220 cells/uL (ref 850–3900)
MCH: 30.3 pg (ref 27.0–33.0)
MCHC: 34.3 g/dL (ref 32.0–36.0)
MCV: 88.3 fL (ref 80.0–100.0)
MPV: 12.4 fL (ref 7.5–12.5)
Monocytes Relative: 5.1 %
Neutro Abs: 3026 cells/uL (ref 1500–7800)
Neutrophils Relative %: 49.6 %
Platelets: 95 10*3/uL — ABNORMAL LOW (ref 140–400)
RBC: 4.79 10*6/uL (ref 4.20–5.80)
RDW: 14.7 % (ref 11.0–15.0)
Total Lymphocyte: 36.4 %
WBC: 6.1 10*3/uL (ref 3.8–10.8)

## 2020-06-30 LAB — COMPLETE METABOLIC PANEL WITH GFR
AG Ratio: 1.4 (calc) (ref 1.0–2.5)
ALT: 24 U/L (ref 9–46)
AST: 35 U/L (ref 10–35)
Albumin: 4.2 g/dL (ref 3.6–5.1)
Alkaline phosphatase (APISO): 73 U/L (ref 35–144)
BUN: 11 mg/dL (ref 7–25)
CO2: 23 mmol/L (ref 20–32)
Calcium: 9.7 mg/dL (ref 8.6–10.3)
Chloride: 106 mmol/L (ref 98–110)
Creat: 0.99 mg/dL (ref 0.70–1.25)
GFR, Est African American: 90 mL/min/{1.73_m2} (ref >=60)
GFR, Est Non African American: 78 mL/min/{1.73_m2} (ref >=60)
Globulin: 3 g/dL (calc) (ref 1.9–3.7)
Glucose, Bld: 104 mg/dL — ABNORMAL HIGH (ref 65–99)
Potassium: 4 mmol/L (ref 3.5–5.3)
Sodium: 139 mmol/L (ref 135–146)
Total Bilirubin: 0.9 mg/dL (ref 0.2–1.2)
Total Protein: 7.2 g/dL (ref 6.1–8.1)

## 2020-06-30 LAB — PSA: PSA: 0.05 ng/mL (ref <=4.0)

## 2020-06-30 LAB — MICROALBUMIN, URINE: Microalb, Ur: 27.4 mg/dL

## 2020-06-30 LAB — LIPID PANEL
Cholesterol: 137 mg/dL (ref <200)
HDL: 35 mg/dL — ABNORMAL LOW (ref >=40)
LDL Cholesterol (Calc): 77 mg/dL (calc)
Non-HDL Cholesterol (Calc): 102 mg/dL (calc) (ref <130)
Total CHOL/HDL Ratio: 3.9 (calc) (ref <5.0)
Triglycerides: 153 mg/dL — ABNORMAL HIGH (ref <150)

## 2020-06-30 LAB — HEMOGLOBIN A1C
Hgb A1c MFr Bld: 5.6 % of total Hgb (ref <5.7)
Mean Plasma Glucose: 114 mg/dL
eAG (mmol/L): 6.3 mmol/L

## 2020-07-01 ENCOUNTER — Other Ambulatory Visit: Payer: Self-pay

## 2020-07-01 ENCOUNTER — Telehealth: Payer: Self-pay

## 2020-07-01 ENCOUNTER — Other Ambulatory Visit: Payer: Self-pay | Admitting: Family Medicine

## 2020-07-01 DIAGNOSIS — R011 Cardiac murmur, unspecified: Secondary | ICD-10-CM

## 2020-07-01 NOTE — Telephone Encounter (Signed)
Patient asked if Provider was going to still send him somewhere in regards to his heart murmur

## 2020-07-01 NOTE — Telephone Encounter (Signed)
Sorry, I forgot to place order. I have ordered echo.

## 2020-07-01 NOTE — Telephone Encounter (Signed)
Ok. Thank you.

## 2020-07-26 NOTE — Telephone Encounter (Signed)
Error

## 2020-07-30 ENCOUNTER — Telehealth: Payer: Self-pay | Admitting: Family Medicine

## 2020-07-30 MED ORDER — HYDROXYZINE HCL 25 MG PO TABS: 25.0000 mg | ORAL_TABLET | Freq: Three times a day (TID) | ORAL | 0 refills | Status: DC | PRN

## 2020-07-30 MED ORDER — METFORMIN HCL 1000 MG PO TABS: 1000.0000 mg | ORAL_TABLET | Freq: Two times a day (BID) | ORAL | 0 refills | Status: DC

## 2020-07-30 MED ORDER — EZETIMIBE 10 MG PO TABS: 10.0000 mg | ORAL_TABLET | Freq: Every day | ORAL | 3 refills | Status: DC

## 2020-07-30 NOTE — Telephone Encounter (Signed)
Prescription sent to pharmacy.

## 2020-07-30 NOTE — Telephone Encounter (Signed)
[facility] requesting a refill on ezetimibe (ZETIA) 10 MG tablet  metFORMIN (GLUCOPHAGE) 1000 MG tablet And hydralazine 25 tab

## 2020-08-05 ENCOUNTER — Ambulatory Visit (HOSPITAL_COMMUNITY): Payer: Medicare Other | Attending: Cardiology

## 2020-08-05 ENCOUNTER — Telehealth: Payer: Self-pay

## 2020-08-05 ENCOUNTER — Other Ambulatory Visit: Payer: Self-pay

## 2020-08-05 ENCOUNTER — Ambulatory Visit: Payer: Self-pay | Admitting: Pharmacist

## 2020-08-05 DIAGNOSIS — R011 Cardiac murmur, unspecified: Secondary | ICD-10-CM | POA: Insufficient documentation

## 2020-08-05 LAB — ECHOCARDIOGRAM COMPLETE
AR max vel: 1.42 cm2
AV Area VTI: 1.19 cm2
AV Area mean vel: 1.13 cm2
AV Mean grad: 17 mmHg
AV Peak grad: 28.4 mmHg
Ao pk vel: 2.66 m/s
Area-P 1/2: 3.53 cm2
S' Lateral: 3.3 cm

## 2020-08-05 NOTE — Chronic Care Management (AMB) (Signed)
  Chronic Care Management   Follow Up Note   08/05/2020 Name: Ricky Rios MRN: [id number] DOB: 01/17/1951  Referred by: Ricky Frizzle, MD Reason for referral : No chief complaint on file.   Ricky Rios is a 70 y.o. year old male who is a primary care patient of Pickard, Ricky Mcgee, MD. The CCM team was consulted for assistance with chronic disease management and care coordination needs.    Review of patient status, including review of consultants reports, relevant laboratory and other test results, and collaboration with appropriate care team members and the patient's provider was performed as part of comprehensive patient evaluation and provision of chronic care management services.    SDOH (Social Determinants of Health) assessments performed: No See Care Plan activities for detailed interventions related to [facility])     Outpatient Encounter Medications as of 08/05/2020  Medication Sig  . ezetimibe (ZETIA) 10 MG tablet Take 1 tablet (10 mg total) by mouth daily.  . hydrOXYzine (ATARAX/VISTARIL) 25 MG tablet Take 1 tablet (25 mg total) by mouth 3 (three) times daily as needed. For itching  . metFORMIN (GLUCOPHAGE) 1000 MG tablet Take 1 tablet (1,000 mg total) by mouth 2 (two) times daily with a meal.  . mometasone (ELOCON) 0.1 % ointment Apply topically daily.  . traMADol (ULTRAM) 50 MG tablet Take 1 tablet (50 mg total) by mouth every 6 (six) hours as needed.  . triamcinolone cream (KENALOG) 0.1 % Apply 1 application topically 2 (two) times daily.   No facility-administered encounter medications on file as of 08/05/2020.     Objective:  Returned patient call for request to help him get set up with [facility] mail order pharmacy.  Goals Addressed   None     There are no care plans to display for this patient.  Patient was having trouble with an automated system for starting delivery services through [facility] mail order pharmacy.  Counseled him that the 11 digit ID requires  him to add two zeros at the end of the ID on his card.  Patient to try this and call me back if he still has trouble.  Also patient requests a refill on Tramadol to be called in to new pharmacy, [facility] on [address]. For refill.  Requested from nurse.  Plan:   The patient has been provided with contact information for the care management team and has been advised to call with any health related questions or concerns.    Ricky Rios, PharmD Clinical Pharmacist [place] [phone]

## 2020-08-06 ENCOUNTER — Other Ambulatory Visit: Payer: Self-pay | Admitting: Family Medicine

## 2020-08-06 MED ORDER — TRAMADOL HCL 50 MG PO TABS: 50.0000 mg | ORAL_TABLET | Freq: Four times a day (QID) | ORAL | 0 refills | Status: DC | PRN

## 2020-08-11 ENCOUNTER — Telehealth: Payer: Self-pay | Admitting: Pharmacist

## 2020-08-11 NOTE — Progress Notes (Signed)
    Chronic Care Management Pharmacy Assistant   Name: Ricky Rios  MRN: [id number] DOB: 03/29/1951  Reason for Encounter: Disease State For DM.  Patient Questions:  1.  Have you seen any other providers since your last visit? No.   2.  Any changes in your medicines or health? No.   PCP : Susy Frizzle, MD   Their chronic conditions include:hypertension, diabetes, hyperlipidemia, tobacco abuse.  Office Visits: None since 08/05/20  Consults: None since 08/05/20   Allergies:   Allergies  Allergen Reactions  . Lipitor [Atorvastatin Calcium] Other (See Comments)    shaking  . Sulfa Antibiotics Other (See Comments)    unknown  . Zocor [Simvastatin - High Dose] Other (See Comments)    shaking    Medications: Outpatient Encounter Medications as of 08/11/2020  Medication Sig  . ezetimibe (ZETIA) 10 MG tablet Take 1 tablet (10 mg total) by mouth daily.  . hydrOXYzine (ATARAX/VISTARIL) 25 MG tablet Take 1 tablet (25 mg total) by mouth 3 (three) times daily as needed. For itching  . metFORMIN (GLUCOPHAGE) 1000 MG tablet Take 1 tablet (1,000 mg total) by mouth 2 (two) times daily with a meal.  . mometasone (ELOCON) 0.1 % ointment Apply topically daily.  . traMADol (ULTRAM) 50 MG tablet Take 1 tablet (50 mg total) by mouth every 6 (six) hours as needed.  . triamcinolone cream (KENALOG) 0.1 % Apply 1 application topically 2 (two) times daily.   No facility-administered encounter medications on file as of 08/11/2020.    Current Diagnosis: Patient Active Problem List   Diagnosis Date Noted  . Thrombocytopenia ([place]) 12/09/2019  . DDD (degenerative disc disease), lumbar   . HLD (hyperlipidemia) 12/09/2012  . Diabetes mellitus without complication ([place])   . Hypertension   . Smoker     Goals Addressed   None    Recent Relevant Labs: Lab Results  Component Value Date/Time   HGBA1C 5.6 06/29/2020 03:53 PM   HGBA1C 6.5 (H) 12/02/2019 12:02 PM   HGBA1C 11.8 (H)  10/19/2014 11:03 AM   MICROALBUR 27.4 06/29/2020 03:53 PM   MICROALBUR 29.6 12/02/2019 12:02 PM    Kidney Function Lab Results  Component Value Date/Time   CREATININE 0.99 06/29/2020 03:53 PM   CREATININE 1.03 12/10/2019 04:26 PM   CREATININE 1.04 12/02/2019 12:02 PM   MOQHUTML 46 06/29/2020 03:53 PM   GFRAA 90 06/29/2020 03:53 PM    . Current antihyperglycemic regimen:  o Metformin 1000 MG 1 tablet by mouth two times daily w/meal  . What recent interventions/DTPs have been made to improve glycemic control:  o None.   Have there been any recent hospitalizations or ED visits since last visit with CPP?  No.  Adherence Review: Is the patient currently on a STATIN medication? No.  Is the patient currently on ACE/ARB medication? No.  Does the patient have >5 day gap between last estimated fill dates? No, CPP Please Check.   Third unsuccessful telephone outreach was attempted today. The patient was referred to the pharmacist for assistance with care management and care coordination.    Follow-Up:  Pharmacist Review   Charlann Lange, [place] Pharmacist Assistant [phone]

## 2020-08-17 ENCOUNTER — Telehealth: Payer: Self-pay | Admitting: Pharmacist

## 2020-08-17 NOTE — Progress Notes (Signed)
° ° °  Chronic Care Management Pharmacy Assistant   Name: Ricky Rios  MRN: [id number] DOB: 24-Dec-1950  Reason for Encounter: Adherence Review  Verified Adherence Gap Information. Per insurance data, patient has met their ...screening or not met their...screening. Their most recent A1C 6.5 on 12/02/19. The patient met goal by keeping the A1C less than 9. Their most recent blood pressure was 140/58 on 12/24/19. The patient did not met goal with keeping his blood pressure below 140. The patient performed their annual wellness and wellness bundle.Their total gap measures equal to 7. Patient colorectal cancer screening is not current.   Follow-Up:  Pharmacist Review   Charlann Lange, [place] Pharmacist Assistant [phone]

## 2020-09-06 NOTE — Progress Notes (Signed)
Chronic Care Management Pharmacy Note  09/09/2020 Name:  Ricky Rios MRN:  [id number] DOB:  02/25/1951  Subjective: Ricky Rios is an 70 y.o. year old male who is a primary patient of Ricky Rios.  The CCM team was consulted for assistance with disease management and care coordination needs.    Engaged with patient by telephone for follow up visit in response to provider referral for pharmacy case management and/or care coordination services.   Consent to Services:  The patient was given the following information about Chronic Care Management services today, agreed to services, and gave verbal consent: 1. CCM service includes personalized support from designated clinical staff supervised by the primary care provider, including individualized plan of care and coordination with other care providers 2. 24/7 contact phone numbers for assistance for urgent and routine care needs. 3. Service will only be billed when office clinical staff spend 20 minutes or more in a month to coordinate care. 4. Only one practitioner may furnish and bill the service in a calendar month. 5.The patient may stop CCM services at any time (effective at the end of the month) by phone call to the office staff. 6. The patient will be responsible for cost sharing (co-pay) of up to 20% of the service fee (after annual deductible is met). Patient agreed to services and consent obtained.  Patient Care Team: Susy Frizzle, Rios as PCP - General (Family Medicine) Edythe Clarity, [facility] as Pharmacist (Pharmacist)  Recent office visits: 06/29/2020 Dennard Schaumann) - labs are controlled, no medication changes  Recent consult visits: None recent  Hospital visits: None in previous 6 months  Objective:  Lab Results  Component Value Date   CREATININE 0.99 06/29/2020   BUN 11 06/29/2020   GFRNONAA 78 06/29/2020   GFRAA 90 06/29/2020   NA 139 06/29/2020   K 4.0 06/29/2020   CALCIUM 9.7 06/29/2020   CO2  23 06/29/2020    Lab Results  Component Value Date/Time   HGBA1C 5.6 06/29/2020 03:53 PM   HGBA1C 6.5 (H) 12/02/2019 12:02 PM   HGBA1C 11.8 (H) 10/19/2014 11:03 AM   MICROALBUR 27.4 06/29/2020 03:53 PM   MICROALBUR 29.6 12/02/2019 12:02 PM    Last diabetic Eye exam:  Lab Results  Component Value Date/Time   HMDIABEYEEXA No Retinopathy 01/06/2019 12:00 AM    Last diabetic Foot exam: No results found for: HMDIABFOOTEX   Lab Results  Component Value Date   CHOL 137 06/29/2020   HDL 35 (L) 06/29/2020   LDLCALC 77 06/29/2020   TRIG 153 (H) 06/29/2020   CHOLHDL 3.9 06/29/2020    Hepatic Function Latest Ref Rng & Units 06/29/2020 12/10/2019 12/02/2019  Total Protein 6.1 - 8.1 g/dL 7.2 7.8 7.1  Albumin 3.5 - 5.0 g/dL - 3.7 -  AST 10 - 35 U/L 35 48(H) 38(H)  ALT 9 - 46 U/L '24 28 22  ' Alk Phosphatase 38 - 126 U/L - 92 -  Total Bilirubin 0.2 - 1.2 mg/dL 0.9 0.9 0.8  Bilirubin, Direct 0.0 - 0.3 mg/dL - - -    Lab Results  Component Value Date/Time   TSH 3.551 05/02/2011 03:16 AM    CBC Latest Ref Rng & Units 06/29/2020 12/10/2019 12/02/2019  WBC 3.8 - 10.8 Thousand/uL 6.1 8.9 7.1  Hemoglobin 13.2 - 17.1 g/dL 14.5 13.9 14.3  Hematocrit 38.5 - 50.0 % 42.3 41.9 43.0  Platelets 140 - 400 Thousand/uL 95(L) 103(L) 89(L)    No results found for: VD25OH  Clinical ASCVD: No  The 10-year ASCVD risk score Mikey Bussing DC Jr., et al., 2013) is: 38.3%   Values used to calculate the score:     Age: 67 years     Sex: Male     Is Non-Hispanic African American: No     Diabetic: Yes     Tobacco smoker: Yes     Systolic Blood Pressure: 160 mmHg     Is BP treated: No     HDL Cholesterol: 35 mg/dL     Total Cholesterol: 137 mg/dL    Depression screen [facility] 2/9 08/09/2017 06/28/2017  Decreased Interest 1 1  Down, Depressed, Hopeless 1 0  PHQ - 2 Score 2 1  Altered sleeping 2 1  Tired, decreased energy 3 1  Change in appetite 1 1  Feeling bad or failure about yourself  0 0  Trouble concentrating 0  0  Moving slowly or fidgety/restless 0 0  Suicidal thoughts 0 0  PHQ-9 Score 8 4  Difficult doing work/chores Somewhat difficult Not difficult at all      Social History   Tobacco Use  Smoking Status Current Every Day Smoker  . Packs/day: 2.00  . Types: Cigarettes  Smokeless Tobacco Never Used  Tobacco Comment   Vaping more then cigs   BP Readings from Last 3 Encounters:  06/29/20 140/70  12/24/19 (!) 140/58  12/10/19 138/61   Pulse Readings from Last 3 Encounters:  06/29/20 76  12/24/19 74  12/10/19 73   Wt Readings from Last 3 Encounters:  06/29/20 259 lb (117.5 kg)  12/24/19 267 lb 4.8 oz (121.2 kg)  12/10/19 265 lb 14.4 oz (120.6 kg)    Assessment/Interventions: Review of patient past medical history, allergies, medications, health status, including review of consultants reports, laboratory and other test data, was performed as part of comprehensive evaluation and provision of chronic care management services.   SDOH:  (Social Determinants of Health) assessments and interventions performed: No   CCM Care Plan  Allergies  Allergen Reactions  . Lipitor [Atorvastatin Calcium] Other (See Comments)    shaking  . Sulfa Antibiotics Other (See Comments)    unknown  . Zocor [Simvastatin - High Dose] Other (See Comments)    shaking    Medications Reviewed Today    Reviewed by Edythe Clarity, [facility] (Pharmacist) on 09/09/20 at 1248  Med List Status: <None>  Medication Order Taking? Sig Documenting Provider Last Dose Status Informant  ezetimibe (ZETIA) 10 MG tablet [id number] Yes Take 1 tablet (10 mg total) by mouth daily. Susy Frizzle, Rios Taking Active   hydrOXYzine (ATARAX/VISTARIL) 25 MG tablet [id number] Yes Take 1 tablet (25 mg total) by mouth 3 (three) times daily as needed. For itching Susy Frizzle, Rios Taking Active   metFORMIN (GLUCOPHAGE) 1000 MG tablet [id number] Yes Take 1 tablet (1,000 mg total) by mouth 2 (two) times daily with a meal. Susy Frizzle, Rios Taking Active   mometasone (ELOCON) 0.1 % ointment [id number] Yes Apply topically daily. Susy Frizzle, Rios Taking Active   traMADol Veatrice Bourbon) 50 MG tablet [id number] Yes Take 1 tablet (50 mg total) by mouth every 6 (six) hours as needed. Susy Frizzle, Rios Taking Active   triamcinolone cream (KENALOG) 0.1 % [id number] Yes Apply 1 application topically 2 (two) times daily. Susy Frizzle, Rios Taking Active   Med List Note Belva Agee, CPhT 01/31/14 2306): Patient was taking zetia 10 mg daily - he can no longer afford this medicaiton  Patient Active Problem List   Diagnosis Date Noted  . Thrombocytopenia ([place]) 12/09/2019  . DDD (degenerative disc disease), lumbar   . HLD (hyperlipidemia) 12/09/2012  . Diabetes mellitus without complication ([place])   . Hypertension   . Smoker     Immunization History  Administered Date(s) Administered  . Fluad Quad(high Dose 65+) 06/13/2019, 06/29/2020  . Pneumococcal Polysaccharide-23 06/29/2020  . Tdap 11/22/2011    Conditions to be addressed/monitored:  hypertension, diabetes, hyperlipidemia, tobacco abuse.  Care Plan : General Pharmacy (Adult)  Updates made by Edythe Clarity, RPH since 09/09/2020 12:00 AM    Problem: HTN, HLD, DM   Priority: High  Onset Date: 09/09/2020    Long-Range Goal: Patient-Specific Goal   Start Date: 09/09/2020  Expected End Date: 03/09/2021  This Visit's Progress: On track  Priority: High  Note:   Current Barriers:  . None identified at this visit .   Pharmacist Clinical Goal(s):  Marland Kitchen Over the next 180 days, patient will achieve adherence to monitoring guidelines and medication adherence to achieve therapeutic efficacy . maintain control of blood sugar  as evidenced by A1c  . Continue positive lifestyle changes through collaboration with PharmD and provider.   Interventions: . 1:1 collaboration with Susy Frizzle, Rios regarding development and update of comprehensive  plan of care as evidenced by provider attestation and co-signature . Inter-disciplinary care team collaboration (see longitudinal plan of care) . Comprehensive medication review performed; medication list updated in electronic medical record  Hypertension (BP goal <140/90) -controlled -Current treatment: . None noted -Medications previously tried: none noted -Current home readings: none available, patient does not monitor -Current dietary habits: Following Dr. Arsenio Katz diet he found online eating more fresh vegetables and fish products.  Reports he has lost approx 20 pounds -Current exercise habits: Minimal, does some exercises from Dr. Arsenio Katz for people with disabilities -Denies hypotensive/hypertensive symptoms -Educated on BP goals and benefits of medications for prevention of heart attack, stroke and kidney damage; Importance of home blood pressure monitoring; Symptoms of hypotension and importance of maintaining adequate hydration; -Counseled to monitor BP at home periodically, document, and provide log at future appointments -Recommended to continue current medication Congratulated him on his positive lifestyle changes  Hyperlipidemia: (LDL goal < 100) -controlled -Current treatment:  Ezetimibe 93m -Medications previously tried: statins (shaking)  -Current dietary patterns: see above -Current exercise habits: see above -Educated on Importance of limiting foods high in cholesterol; -Recommended to continue current medication  Diabetes (A1c goal <6.5%) -controlled -Current medications:  Metformin 10021mtwice daily -Medications previously tried: none noted  -Current home glucose readings . fasting glucose: not monitoring . post prandial glucose: not monitoring -Denies hypoglycemic/hyperglycemic symptoms -Current meal patterns:  . Following Diet from Dr. LiArsenio Katzbout eating healthier.  Now eats more raw veggies, limits processed foods. -Current exercise: see  above -Educated onA1c and blood sugar goals; Benefits of weight loss; Prevention and management of hypoglycemic episodes; Benefits of routine self-monitoring of blood sugar; -Counseled to check feet daily and get yearly eye exams -Counseled on being aware of hypoglycemia as he loses weight and continues healthy diet  Recommended continue current medications, notify providers if you experience hypoglycemia symptoms   Patient Goals/Self-Care Activities . Over the next 120 days, patient will:  - take medications as prescribed target a minimum of 150 minutes of moderate intensity exercise weekly continue positive lifestyle changes  Follow Up Plan: The care management team will reach out to the patient again over the next 180 days.  Medication Assistance: None required.  Patient affirms current coverage meets needs.  Patient's preferred pharmacy is:  [facility], [place] [address] Phone: [phone] Fax: [phone]  [id number] [id number]: [phone]: [phone] Uses pill box? No  Pt endorses 100% compliance [facility]  We discussed: Current pharmacy is preferred with insurance plan and patient is satisfied with pharmacy services Patient decided to: Continue current medication management strategy  Care Plan and Follow Up Patient Decision:  Patient agrees to Care Plan and Follow-up.  Plan: The care management team will reach out to the patient again over the next 180 days.  CBeverly Milch PharmD Clinical Pharmacist [place]([phone]

## 2020-09-09 ENCOUNTER — Ambulatory Visit (INDEPENDENT_AMBULATORY_CARE_PROVIDER_SITE_OTHER): Payer: Medicare Other | Admitting: Pharmacist

## 2020-09-09 DIAGNOSIS — I10 Essential (primary) hypertension: Secondary | ICD-10-CM | POA: Diagnosis not present

## 2020-09-09 DIAGNOSIS — E785 Hyperlipidemia, unspecified: Secondary | ICD-10-CM

## 2020-09-09 DIAGNOSIS — E119 Type 2 diabetes mellitus without complications: Secondary | ICD-10-CM

## 2020-09-09 NOTE — Patient Instructions (Addendum)
Visit Information  Goals Addressed            This Visit's Progress   . Lifestyle Change-Hypertension       Timeframe:  Long-Range Goal Priority:  Medium Start Date:    09/09/20                         Expected End Date:    03/09/21                   Follow Up Date 12/21/20   - learn about high blood pressure    Why is this important?    The changes that you are asked to make may be hard to do.   This is especially true when the changes are life-long.   Knowing why it is important to you is the first step.   Working on the change with your family or support person helps you not feel alone.   Reward yourself and family or support person when goals are met. This can be an activity you choose like bowling, hiking, biking, swimming or shooting hoops.     Notes: Continue positive changes with diet and weight loss!!!!      Patient Care Plan: General Pharmacy (Adult)    Problem Identified: HTN, HLD, DM   Priority: High  Onset Date: 09/09/2020    Long-Range Goal: Patient-Specific Goal   Start Date: 09/09/2020  Expected End Date: 03/09/2021  This Visit's Progress: On track  Priority: High  Note:   Current Barriers:  . None identified at this visit .   Pharmacist Clinical Goal(s):  Marland Kitchen Over the next 180 days, patient will achieve adherence to monitoring guidelines and medication adherence to achieve therapeutic efficacy . maintain control of blood sugar  as evidenced by A1c  . Continue positive lifestyle changes through collaboration with PharmD and provider.   Interventions: . 1:1 collaboration with Susy Frizzle, MD regarding development and update of comprehensive plan of care as evidenced by provider attestation and co-signature . Inter-disciplinary care team collaboration (see longitudinal plan of care) . Comprehensive medication review performed; medication list updated in electronic medical record  Hypertension (BP goal <140/90) -controlled -Current  treatment: . None noted -Medications previously tried: none noted -Current home readings: none available, patient does not monitor -Current dietary habits: Following Dr. Arsenio Katz diet he found online eating more fresh vegetables and fish products.  Reports he has lost approx 20 pounds -Current exercise habits: Minimal, does some exercises from Dr. Arsenio Katz for people with disabilities -Denies hypotensive/hypertensive symptoms -Educated on BP goals and benefits of medications for prevention of heart attack, stroke and kidney damage; Importance of home blood pressure monitoring; Symptoms of hypotension and importance of maintaining adequate hydration; -Counseled to monitor BP at home periodically, document, and provide log at future appointments -Recommended to continue current medication Congratulated him on his positive lifestyle changes  Hyperlipidemia: (LDL goal < 100) -controlled -Current treatment:  Ezetimibe 30m -Medications previously tried: statins (shaking)  -Current dietary patterns: see above -Current exercise habits: see above -Educated on Importance of limiting foods high in cholesterol; -Recommended to continue current medication  Diabetes (A1c goal <6.5%) -controlled -Current medications:  Metformin 10086mtwice daily -Medications previously tried: none noted  -Current home glucose readings . fasting glucose: not monitoring . post prandial glucose: not monitoring -Denies hypoglycemic/hyperglycemic symptoms -Current meal patterns:  . Following Diet from Dr. LiArsenio Katzbout eating healthier.  Now eats more raw veggies, limits processed  foods. -Current exercise: see above -Educated onA1c and blood sugar goals; Benefits of weight loss; Prevention and management of hypoglycemic episodes; Benefits of routine self-monitoring of blood sugar; -Counseled to check feet daily and get yearly eye exams -Counseled on being aware of hypoglycemia as he loses weight and  continues healthy diet  Recommended continue current medications, notify providers if you experience hypoglycemia symptoms   Patient Goals/Self-Care Activities . Over the next 120 days, patient will:  - take medications as prescribed target a minimum of 150 minutes of moderate intensity exercise weekly continue positive lifestyle changes  Follow Up Plan: The care management team will reach out to the patient again over the next 180 days.        Patient Care Plan: General Pharmacy (Adult)    Problem Identified: HTN, HLD, DM   Priority: High  Onset Date: 09/09/2020    Long-Range Goal: Patient-Specific Goal   Start Date: 09/09/2020  Expected End Date: 03/09/2021  This Visit's Progress: On track  Priority: High  Note:   Current Barriers:  . None identified at this visit .   Pharmacist Clinical Goal(s):  Marland Kitchen Over the next 180 days, patient will achieve adherence to monitoring guidelines and medication adherence to achieve therapeutic efficacy . maintain control of blood sugar  as evidenced by A1c  . Continue positive lifestyle changes through collaboration with PharmD and provider.   Interventions: . 1:1 collaboration with Susy Frizzle, MD regarding development and update of comprehensive plan of care as evidenced by provider attestation and co-signature . Inter-disciplinary care team collaboration (see longitudinal plan of care) . Comprehensive medication review performed; medication list updated in electronic medical record  Hypertension (BP goal <140/90) -controlled -Current treatment: . None noted -Medications previously tried: none noted -Current home readings: none available, patient does not monitor -Current dietary habits: Following Dr. Arsenio Katz diet he found online eating more fresh vegetables and fish products.  Reports he has lost approx 20 pounds -Current exercise habits: Minimal, does some exercises from Dr. Arsenio Katz for people with disabilities -Denies  hypotensive/hypertensive symptoms -Educated on BP goals and benefits of medications for prevention of heart attack, stroke and kidney damage; Importance of home blood pressure monitoring; Symptoms of hypotension and importance of maintaining adequate hydration; -Counseled to monitor BP at home periodically, document, and provide log at future appointments -Recommended to continue current medication Congratulated him on his positive lifestyle changes  Hyperlipidemia: (LDL goal < 100) -controlled -Current treatment:  Ezetimibe 34m -Medications previously tried: statins (shaking)  -Current dietary patterns: see above -Current exercise habits: see above -Educated on Importance of limiting foods high in cholesterol; -Recommended to continue current medication  Diabetes (A1c goal <6.5%) -controlled -Current medications:  Metformin 10057mtwice daily -Medications previously tried: none noted  -Current home glucose readings . fasting glucose: not monitoring . post prandial glucose: not monitoring -Denies hypoglycemic/hyperglycemic symptoms -Current meal patterns:  . Following Diet from Dr. LiArsenio Katzbout eating healthier.  Now eats more raw veggies, limits processed foods. -Current exercise: see above -Educated onA1c and blood sugar goals; Benefits of weight loss; Prevention and management of hypoglycemic episodes; Benefits of routine self-monitoring of blood sugar; -Counseled to check feet daily and get yearly eye exams -Counseled on being aware of hypoglycemia as he loses weight and continues healthy diet  Recommended continue current medications, notify providers if you experience hypoglycemia symptoms   Patient Goals/Self-Care Activities . Over the next 120 days, patient will:  - take medications as prescribed target a minimum of 150  minutes of moderate intensity exercise weekly continue positive lifestyle changes  Follow Up Plan: The care management team will reach out  to the patient again over the next 180 days.        The patient verbalized understanding of instructions, educational materials, and care plan provided today and agreed to receive a mailed copy of patient instructions, educational materials, and care plan.  Telephone follow up appointment with pharmacy team member scheduled for: 4 months  Edythe Clarity, [facility]  Diabetes Mellitus and Exercise Exercising regularly is important for overall health, especially for people who have diabetes mellitus. Exercising is not only about losing weight. It has many other health benefits, such as increasing muscle strength and bone density and reducing body fat and stress. This leads to improved fitness, flexibility, and endurance, all of which result in better overall health. What are the benefits of exercise if I have diabetes? Exercise has many benefits for people with diabetes. They include:  Helping to lower and control blood sugar (glucose).  Helping the body to respond better to the hormone insulin by improving insulin sensitivity.  Reducing how much insulin the body needs.  Lowering the risk for heart disease by: ? Lowering "bad" cholesterol and triglyceride levels. ? Increasing "good" cholesterol levels. ? Lowering blood pressure. ? Lowering blood glucose levels. What is my activity plan? Your health care provider or certified diabetes educator can help you make a plan for the type and frequency of exercise that works for you. This is called your activity plan. Be sure to:  Get at least 150 minutes of medium-intensity or high-intensity exercise each week. Exercises may include brisk walking, biking, or water aerobics.  Do stretching and strengthening exercises, such as yoga or weight lifting, at least 2 times a week.  Spread out your activity over at least 3 days of the week.  Get some form of physical activity each day. ? Do not go more than 2 days in a row without some kind of physical  activity. ? Avoid being inactive for more than 90 minutes at a time. Take frequent breaks to walk or stretch.  Choose exercises or activities that you enjoy. Set realistic goals.  Start slowly and gradually increase your exercise intensity over time.   How do I manage my diabetes during exercise? Monitor your blood glucose  Check your blood glucose before and after exercising. If your blood glucose is: ? 240 mg/dL (13.3 mmol/L) or higher before you exercise, check your urine for ketones. These are chemicals created by the liver. If you have ketones in your urine, do not exercise until your blood glucose returns to normal. ? 100 mg/dL (5.6 mmol/L) or lower, eat a snack containing 15-20 grams of carbohydrate. Check your blood glucose 15 minutes after the snack to make sure that your glucose level is above 100 mg/dL (5.6 mmol/L) before you start your exercise.  Know the symptoms of low blood glucose (hypoglycemia) and how to treat it. Your risk for hypoglycemia increases during and after exercise. Follow these tips and your health care provider's instructions  Keep a carbohydrate snack that is fast-acting for use before, during, and after exercise to help prevent or treat hypoglycemia.  Avoid injecting insulin into areas of the body that are going to be exercised. For example, avoid injecting insulin into: ? Your arms, when you are about to play tennis. ? Your legs, when you are about to go jogging.  Keep records of your exercise habits. Doing this can  help you and your health care provider adjust your diabetes management plan as needed. Write down: ? Food that you eat before and after you exercise. ? Blood glucose levels before and after you exercise. ? The type and amount of exercise you have done.  Work with your health care provider when you start a new exercise or activity. He or she may need to: ? Make sure that the activity is safe for you. ? Adjust your insulin, other medicines, and  food that you eat.  Drink plenty of water while you exercise. This prevents loss of water (dehydration) and problems caused by a lot of heat in the body (heat stroke).   Where to find more information  American Diabetes Association: www.diabetes.org Summary  Exercising regularly is important for overall health, especially for people who have diabetes mellitus.  Exercising has many health benefits. It increases muscle strength and bone density and reduces body fat and stress. It also lowers and controls blood glucose.  Your health care provider or certified diabetes educator can help you make an activity plan for the type and frequency of exercise that works for you.  Work with your health care provider to make sure any new activity is safe for you. Also work with your health care provider to adjust your insulin, other medicines, and the food you eat. This information is not intended to replace advice given to you by your health care provider. Make sure you discuss any questions you have with your health care provider. Document Revised: 04/07/2019 Document Reviewed: 04/07/2019 Elsevier Patient Education  [place].

## 2020-09-21 NOTE — Telephone Encounter (Signed)
No action needed at this time, closing open encounter

## 2020-10-05 ENCOUNTER — Other Ambulatory Visit: Payer: Self-pay | Admitting: Family Medicine

## 2020-10-09 ENCOUNTER — Other Ambulatory Visit: Payer: Self-pay | Admitting: Family Medicine

## 2020-11-11 IMAGING — US US ABDOMEN COMPLETE
1 series · 13 of 25 positions shown · non-contrast
Comparison: February 01, 2014.

CLINICAL DATA: Thrombocytopenia.

EXAM:
ABDOMEN ULTRASOUND COMPLETE

[Series 1: us abdomen complete · 13 of 134 slices shown]
[im 1/134]
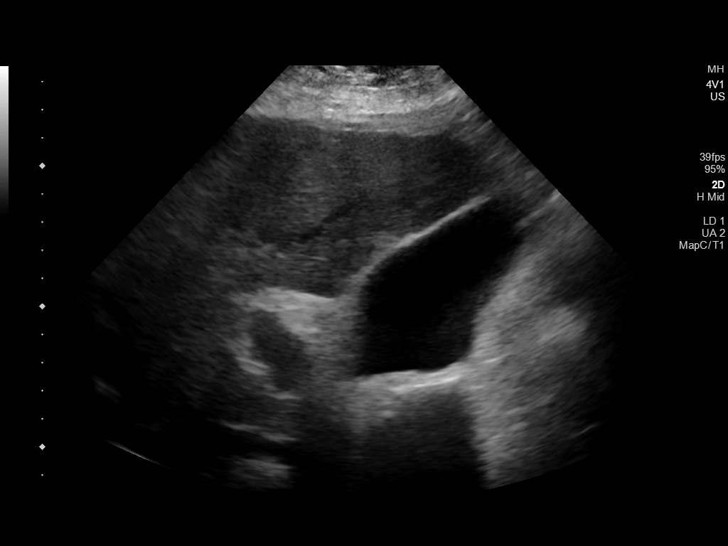
[im 12/134]
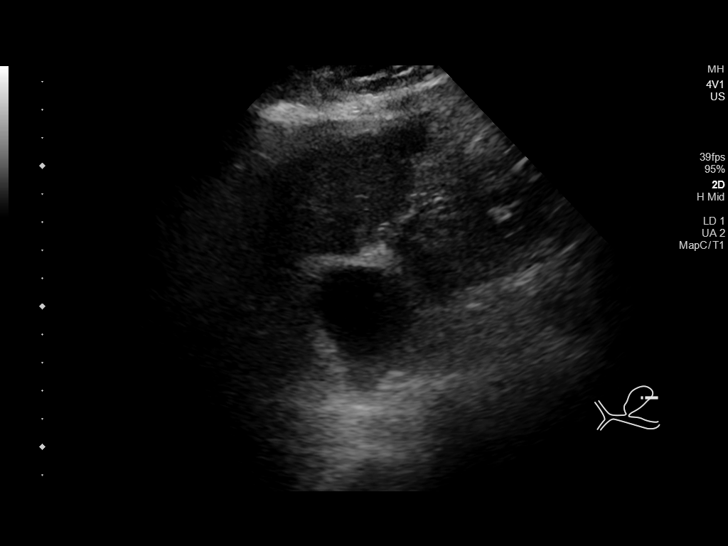
[im 23/134]
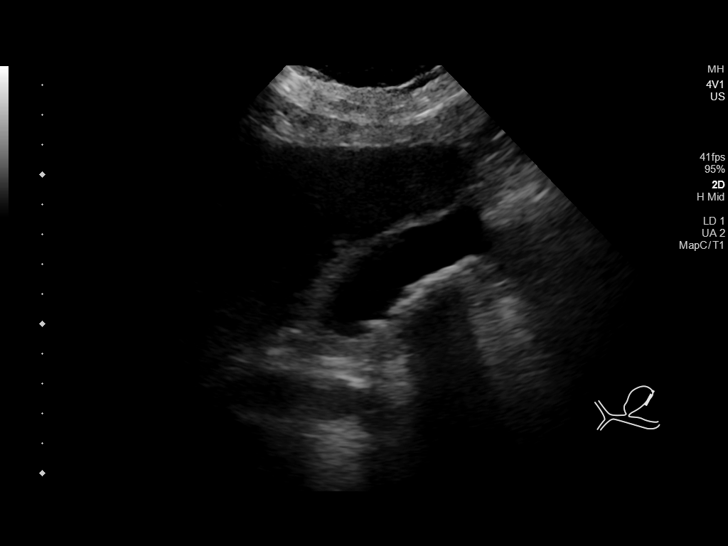
[im 34/134]
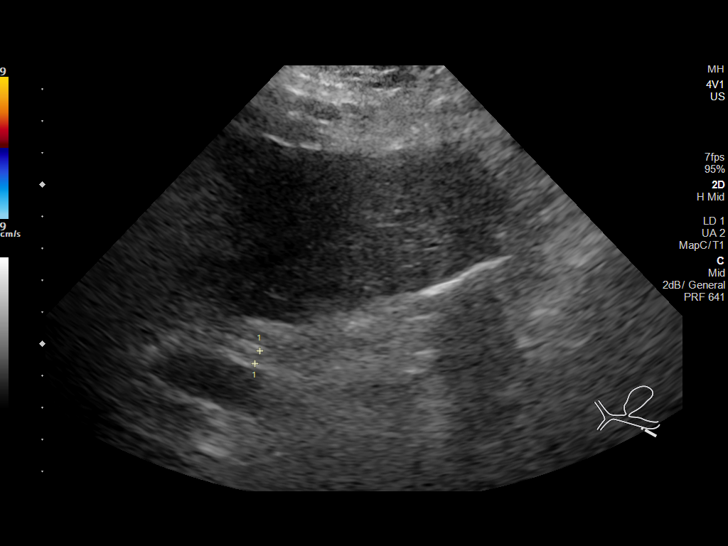
[im 45/134]
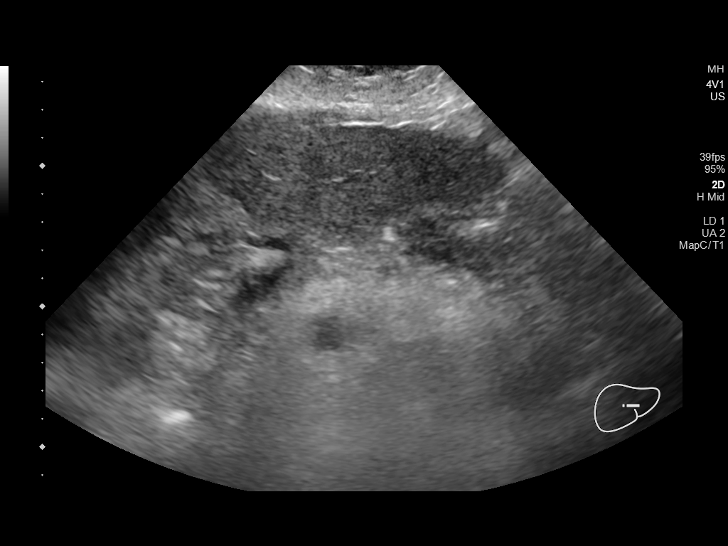
[im 56/134]
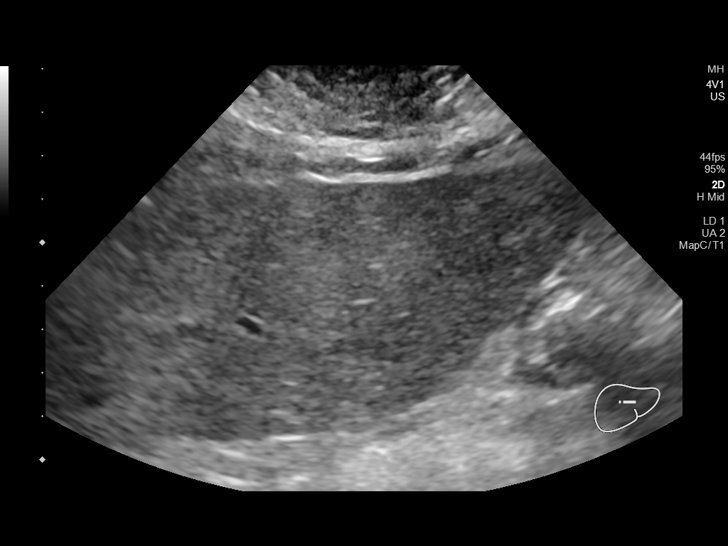
[im 67/134]
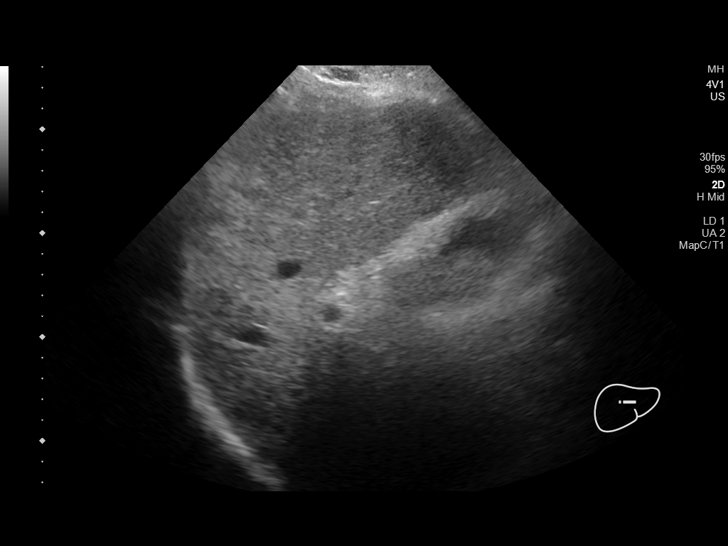
[im 78/134]
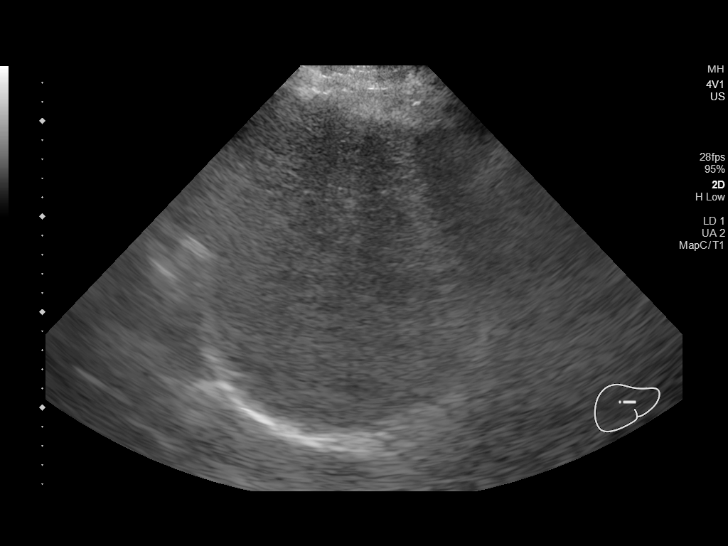
[im 89/134]
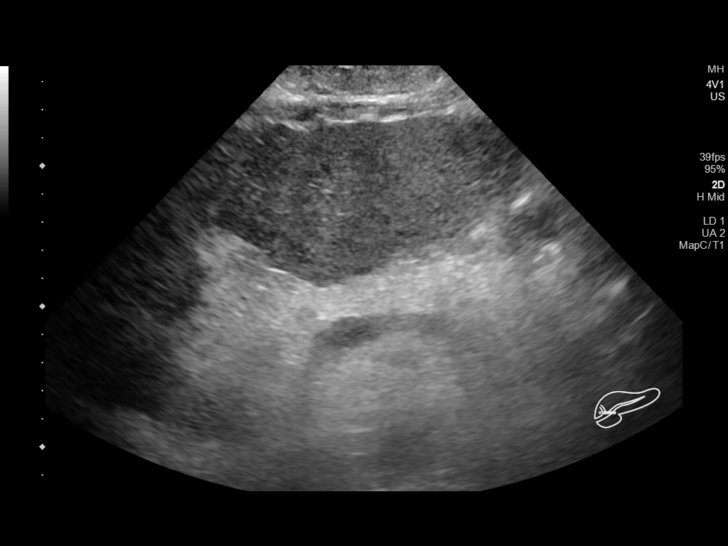
[im 100/134]
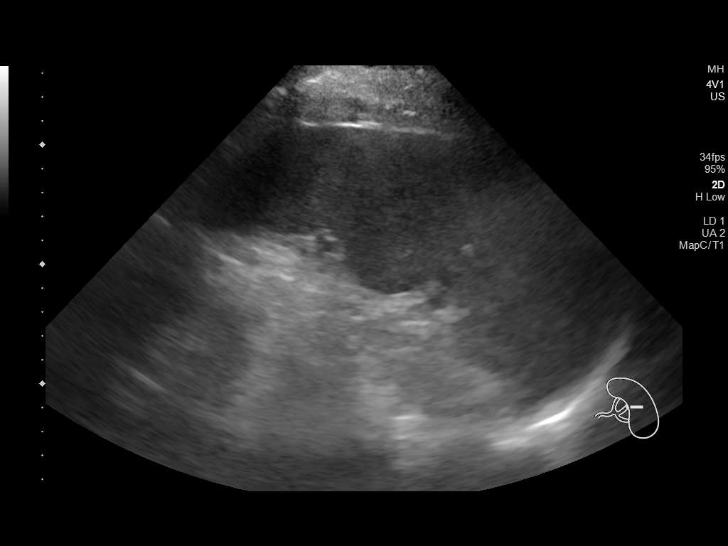
[im 111/134]
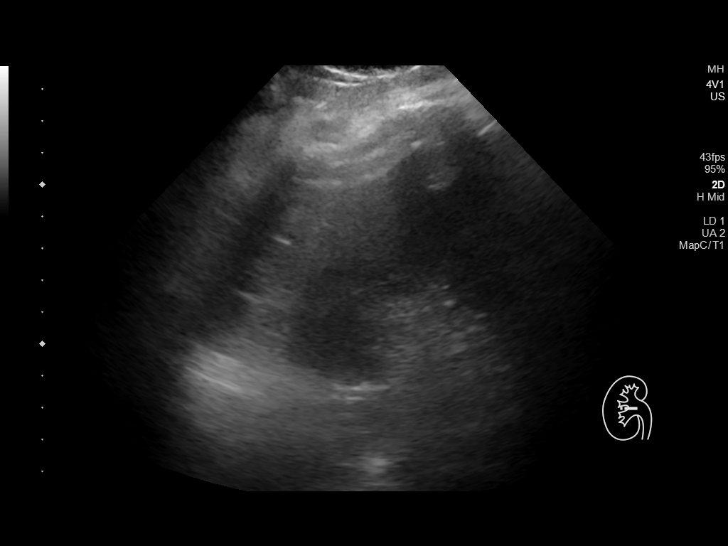
[im 122/134]
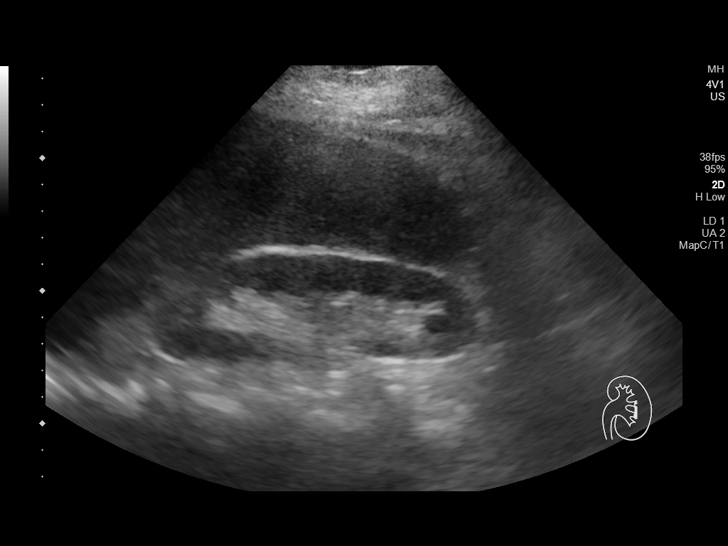
[im 134/134]
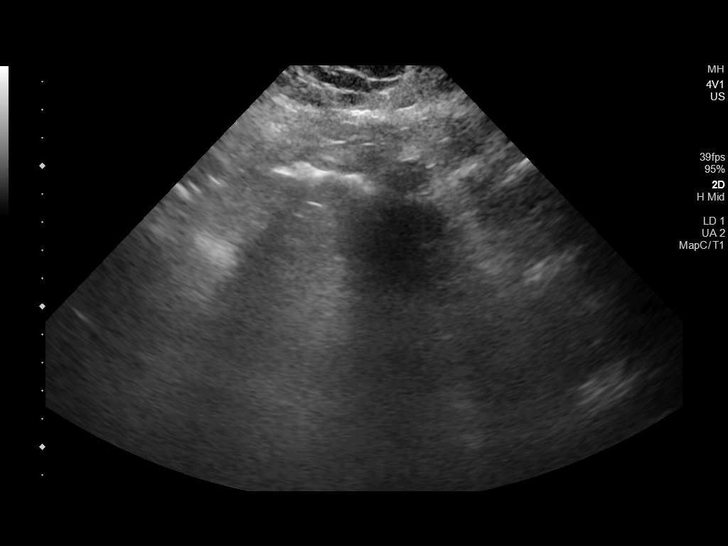

[13 of 25 positions shown; findings below may reference images not displayed]

FINDINGS: Gallbladder: Mild cholelithiasis is noted without gallbladder wall
thickening or pericholecystic fluid. No sonographic Murphy's sign is
noted.

Common bile duct: Diameter: 4 mm which is within normal limits.

Liver: Heterogeneous echotexture of hepatic parenchyma is noted with
nodular hepatic contours suggesting cirrhosis. No definite focal
sonographic hepatic abnormality is noted. Portal vein is patent on
color Doppler imaging with normal direction of blood flow towards
the liver.

IVC: No abnormality visualized.

Pancreas: Visualized portion unremarkable.

Spleen: Maximum measured diameter of 17.7 cm with calculated volume
of 12 103 cubic cm consistent with moderate to severe splenomegaly.
No focal abnormality is noted.

Right Kidney: Length: 12.3 cm. Echogenicity within normal limits. No
mass or hydronephrosis visualized.

Left Kidney: Length: 12.9 cm. Echogenicity within normal limits. No
mass or hydronephrosis visualized.

Abdominal aorta: No aneurysm visualized.

Other findings: None.
IMPRESSION: Findings consistent with hepatic cirrhosis with moderate to severe
splenomegaly. No focal sonographic hepatic abnormality is noted.

Mild cholelithiasis is noted without evidence of cholecystitis.

## 2020-11-29 ENCOUNTER — Telehealth: Payer: Self-pay | Admitting: Pharmacist

## 2020-11-29 NOTE — Progress Notes (Addendum)
    Chronic Care Management Pharmacy Assistant   Name: Ricky Rios  MRN: [id number] DOB: June 27, 1951  Reason for Encounter: Disease State For DM.   Conditions to be addressed/monitored: hypertension, diabetes, hyperlipidemia, tobacco abuse.  Recent office visits:  None since 09/09/20  Recent consult visits:  None since 09/09/20  Hospital visits:  None since 09/09/20  Medications: Outpatient Encounter Medications as of 11/29/2020  Medication Sig   ezetimibe (ZETIA) 10 MG tablet Take 1 tablet (10 mg total) by mouth daily.   hydrOXYzine (ATARAX/VISTARIL) 25 MG tablet TAKE 1 TABLET (25 MG TOTAL) BY MOUTH 3 (THREE) TIMES DAILY AS NEEDED. FOR ITCHING   metFORMIN (GLUCOPHAGE) 1000 MG tablet TAKE 1 TABLET (1,000 MG TOTAL) BY MOUTH 2 (TWO) TIMES DAILY WITH MEALS.   mometasone (ELOCON) 0.1 % ointment Apply topically daily.   traMADol (ULTRAM) 50 MG tablet Take 1 tablet (50 mg total) by mouth every 6 (six) hours as needed.   triamcinolone cream (KENALOG) 0.1 % Apply 1 application topically 2 (two) times daily.   No facility-administered encounter medications on file as of 11/29/2020.    Recent Relevant Labs: Lab Results  Component Value Date/Time   HGBA1C 5.6 06/29/2020 03:53 PM   HGBA1C 6.5 (H) 12/02/2019 12:02 PM   HGBA1C 11.8 (H) 10/19/2014 11:03 AM   MICROALBUR 27.4 06/29/2020 03:53 PM   MICROALBUR 29.6 12/02/2019 12:02 PM    Kidney Function Lab Results  Component Value Date/Time   CREATININE 0.99 06/29/2020 03:53 PM   CREATININE 1.03 12/10/2019 04:26 PM   CREATININE 1.04 12/02/2019 12:02 PM   GFRNONAA 78 06/29/2020 03:53 PM   GFRAA 90 06/29/2020 03:53 PM    Current antihyperglycemic regimen:  Metformin 1000 mg twice daily  What recent interventions/DTPs have been made to improve glycemic control:  None.    Have there been any recent hospitalizations or ED visits since last visit with CPP? Patient stated no.   Patient denies hypoglycemic symptoms, including None    Patient denies hyperglycemic symptoms, including polyuria   How often are you checking your blood sugar? Patient stated he does not check his blood sugar at home.  What are your blood sugars ranging? Patient stated his strips are too expensive.   During the week, how often does your blood glucose drop below 70?  Patient stated he does not check his blood sugar at home.  Are you checking your feet daily/regularly?  Patient stated he does check it regularly.   Adherence Review: Is the patient currently on a STATIN medication? None.   Is the patient currently on ACE/ARB medication? None.  Does the patient have >5 day gap between last estimated fill dates? N/A  Star Rating Drugs: Metformin 1000 mg 90 DS 08/02/20  Patient believes he more then likely needs a new meter for his blood sugar, he plans on going to see Dr. Dennard Schaumann for a F/U and updated labs so he will ask for a RX for a new one then.   Follow-Up:Pharmacist Review  Charlann Lange, RMA Clinical Pharmacist Assistant [phone]  10 minutes spent in review, coordination, and documentation.  Reviewed by: Beverly Milch, PharmD Clinical Pharmacist [facility] [phone]

## 2021-01-27 ENCOUNTER — Telehealth: Payer: Self-pay | Admitting: Pharmacist

## 2021-01-27 NOTE — Progress Notes (Addendum)
    Chronic Care Management Pharmacy Assistant   Name: LAYN KYE  MRN: [id number] DOB: 09-Mar-1951  Reason for Encounter: Disease State For HTN.    Conditions to be addressed/monitored: hypertension, diabetes, hyperlipidemia, tobacco abuse.  Recent office visits:  None since 11/29/20  Recent consult visits:  None since 11/29/20  Hospital visits:  None since 11/29/20  Medications: Outpatient Encounter Medications as of 01/27/2021  Medication Sig   ezetimibe (ZETIA) 10 MG tablet Take 1 tablet (10 mg total) by mouth daily.   hydrOXYzine (ATARAX/VISTARIL) 25 MG tablet TAKE 1 TABLET (25 MG TOTAL) BY MOUTH 3 (THREE) TIMES DAILY AS NEEDED. FOR ITCHING   metFORMIN (GLUCOPHAGE) 1000 MG tablet TAKE 1 TABLET (1,000 MG TOTAL) BY MOUTH 2 (TWO) TIMES DAILY WITH MEALS.   mometasone (ELOCON) 0.1 % ointment Apply topically daily.   traMADol (ULTRAM) 50 MG tablet Take 1 tablet (50 mg total) by mouth every 6 (six) hours as needed.   triamcinolone cream (KENALOG) 0.1 % Apply 1 application topically 2 (two) times daily.   No facility-administered encounter medications on file as of 01/27/2021.   Recent Relevant Labs: Lab Results  Component Value Date/Time   HGBA1C 5.6 06/29/2020 03:53 PM   HGBA1C 6.5 (H) 12/02/2019 12:02 PM   HGBA1C 11.8 (H) 10/19/2014 11:03 AM   MICROALBUR 27.4 06/29/2020 03:53 PM   MICROALBUR 29.6 12/02/2019 12:02 PM    Kidney Function Lab Results  Component Value Date/Time   CREATININE 0.99 06/29/2020 03:53 PM   CREATININE 1.03 12/10/2019 04:26 PM   CREATININE 1.04 12/02/2019 12:02 PM   GFRNONAA 78 06/29/2020 03:53 PM   GFRAA 90 06/29/2020 03:53 PM    Current antihyperglycemic regimen:  Metformin 1000 mg twice daily  What recent interventions/DTPs have been made to improve glycemic control:  None  Have there been any recent hospitalizations or ED visits since last visit with CPP? Patient stated no.  Patient denies hypoglycemic symptoms, including None  Patient  denies hyperglycemic symptoms, including none  How often are you checking your blood sugar? Patient stated he does not check his blood sugar at home.  What are your blood sugars ranging? Patient stated he does not check his blood sugar at home.  During the week, how often does your blood glucose drop below 70? Patient stated he does not check his blood sugar at home.  Are you checking your feet daily/regularly?  Patient stated he does check it regularly.   Adherence Review: Is the patient currently on a STATIN medication? N/A  Is the patient currently on ACE/ARB medication? N/A  Does the patient have >5 day gap between last estimated fill dates? Per misc rpts, yes.   Star Rating Drugs: Metformin 1000 mg 90 DS 08/02/20   Wanted to follow-up on the patient about his blood sugar meter but patient still has not made appointment to speak with the doctor about getting a new meter but stated he plans to call soon. I explained to him the importance monitoring his blood sugar he voiced understanding.   Follow-up:Pharmacist Review  Charlann Lange, RMA Clinical Pharmacist Assistant [phone]  10 minutes spent in review, coordination, and documentation.  Reviewed by: Beverly Milch, PharmD Clinical Pharmacist [facility] [phone]

## 2021-03-10 ENCOUNTER — Ambulatory Visit (INDEPENDENT_AMBULATORY_CARE_PROVIDER_SITE_OTHER): Payer: Medicare Other | Admitting: Pharmacist

## 2021-03-10 DIAGNOSIS — E119 Type 2 diabetes mellitus without complications: Secondary | ICD-10-CM | POA: Diagnosis not present

## 2021-03-10 DIAGNOSIS — E785 Hyperlipidemia, unspecified: Secondary | ICD-10-CM

## 2021-03-10 NOTE — Progress Notes (Signed)
Chronic Care Management Pharmacy Note  03/10/2021 Name:  Ricky Rios MRN:  [id number] DOB:  1950/11/08  Subjective: Ricky Rios is an 70 y.o. year old male who is a primary patient of Pickard, Cammie Mcgee, MD.  The CCM team was consulted for assistance with disease management and care coordination needs.    Engaged with patient by telephone for follow up visit in response to provider referral for pharmacy case management and/or care coordination services.   Consent to Services:  The patient was given the following information about Chronic Care Management services today, agreed to services, and gave verbal consent: 1. CCM service includes personalized support from designated clinical staff supervised by the primary care provider, including individualized plan of care and coordination with other care providers 2. 24/7 contact phone numbers for assistance for urgent and routine care needs. 3. Service will only be billed when office clinical staff spend 20 minutes or more in a month to coordinate care. 4. Only one practitioner may furnish and bill the service in a calendar month. 5.The patient may stop CCM services at any time (effective at the end of the month) by phone call to the office staff. 6. The patient will be responsible for cost sharing (co-pay) of up to 20% of the service fee (after annual deductible is met). Patient agreed to services and consent obtained.  Patient Care Team: Susy Frizzle, MD as PCP - General (Family Medicine) Edythe Clarity, [facility] as Pharmacist (Pharmacist)  Recent office visits: 06/29/2020 Dennard Schaumann) - labs are controlled, no medication changes  Recent consult visits: None recent  Hospital visits: None in previous 6 months  Objective:  Lab Results  Component Value Date   CREATININE 0.99 06/29/2020   BUN 11 06/29/2020   GFRNONAA 78 06/29/2020   GFRAA 90 06/29/2020   NA 139 06/29/2020   K 4.0 06/29/2020   CALCIUM 9.7 06/29/2020   CO2  23 06/29/2020    Lab Results  Component Value Date/Time   HGBA1C 5.6 06/29/2020 03:53 PM   HGBA1C 6.5 (H) 12/02/2019 12:02 PM   HGBA1C 11.8 (H) 10/19/2014 11:03 AM   MICROALBUR 27.4 06/29/2020 03:53 PM   MICROALBUR 29.6 12/02/2019 12:02 PM    Last diabetic Eye exam:  Lab Results  Component Value Date/Time   HMDIABEYEEXA No Retinopathy 01/06/2019 12:00 AM    Last diabetic Foot exam: No results found for: HMDIABFOOTEX   Lab Results  Component Value Date   CHOL 137 06/29/2020   HDL 35 (L) 06/29/2020   LDLCALC 77 06/29/2020   TRIG 153 (H) 06/29/2020   CHOLHDL 3.9 06/29/2020    Hepatic Function Latest Ref Rng & Units 06/29/2020 12/10/2019 12/02/2019  Total Protein 6.1 - 8.1 g/dL 7.2 7.8 7.1  Albumin 3.5 - 5.0 g/dL - 3.7 -  AST 10 - 35 U/L 35 48(H) 38(H)  ALT 9 - 46 U/L _0 Alk Phosphatase 38 - 126 U/L - 92 -  Total Bilirubin 0.2 - 1.2 mg/dL 0.9 0.9 0.8  Bilirubin, Direct 0.0 - 0.3 mg/dL - - -    Lab Results  Component Value Date/Time   TSH 3.551 05/02/2011 03:16 AM    CBC Latest Ref Rng & Units 06/29/2020 12/10/2019 12/02/2019  WBC 3.8 - 10.8 Thousand/uL 6.1 8.9 7.1  Hemoglobin 13.2 - 17.1 g/dL 14.5 13.9 14.3  Hematocrit 38.5 - 50.0 % 42.3 41.9 43.0  Platelets 140 - 400 Thousand/uL 95(L) 103(L) 89(L)    No results found for: VD25OH  Clinical ASCVD: No  The 10-year ASCVD risk score Mikey Bussing DC Jr., et al., 2013) is: 38.3%   Values used to calculate the score:     Age: 70 years     Sex: Male     Is Non-Hispanic African American: No     Diabetic: Yes     Tobacco smoker: Yes     Systolic Blood Pressure: 161 mmHg     Is BP treated: No     HDL Cholesterol: 35 mg/dL     Total Cholesterol: 137 mg/dL    Depression screen [facility] 2/9 08/09/2017 06/28/2017  Decreased Interest 1 1  Down, Depressed, Hopeless 1 0  PHQ - 2 Score 2 1  Altered sleeping 2 1  Tired, decreased energy 3 1  Change in appetite 1 1  Feeling bad or failure about yourself  0 0  Trouble concentrating 0  0  Moving slowly or fidgety/restless 0 0  Suicidal thoughts 0 0  PHQ-9 Score 8 4  Difficult doing work/chores Somewhat difficult Not difficult at all      Social History   Tobacco Use  Smoking Status Every Day   Packs/day: 2.00   Types: Cigarettes  Smokeless Tobacco Never  Tobacco Comments   Vaping more then cigs   BP Readings from Last 3 Encounters:  06/29/20 140/70  12/24/19 (!) 140/58  12/10/19 138/61   Pulse Readings from Last 3 Encounters:  06/29/20 76  12/24/19 74  12/10/19 73   Wt Readings from Last 3 Encounters:  06/29/20 259 lb (117.5 kg)  12/24/19 267 lb 4.8 oz (121.2 kg)  12/10/19 265 lb 14.4 oz (120.6 kg)    Assessment/Interventions: Review of patient past medical history, allergies, medications, health status, including review of consultants reports, laboratory and other test data, was performed as part of comprehensive evaluation and provision of chronic care management services.   SDOH:  (Social Determinants of Health) assessments and interventions performed: No   CCM Care Plan  Allergies  Allergen Reactions   Lipitor [Atorvastatin Calcium] Other (See Comments)    shaking   Sulfa Antibiotics Other (See Comments)    unknown   Zocor [Simvastatin - High Dose] Other (See Comments)    shaking    Medications Reviewed Today     Reviewed by Edythe Clarity, [facility] (Pharmacist) on 03/10/21 at 1221  Med List Status: <None>   Medication Order Taking? Sig Documenting Provider Last Dose Status Informant  ezetimibe (ZETIA) 10 MG tablet [id number] Yes Take 1 tablet (10 mg total) by mouth daily. Susy Frizzle, MD Taking Active   hydrOXYzine (ATARAX/VISTARIL) 25 MG tablet [id number] Yes TAKE 1 TABLET (25 MG TOTAL) BY MOUTH 3 (THREE) TIMES DAILY AS NEEDED. FOR Gery Pray, MD Taking Active   metFORMIN (GLUCOPHAGE) 1000 MG tablet [id number] Yes TAKE 1 TABLET (1,000 MG TOTAL) BY MOUTH 2 (TWO) TIMES DAILY WITH MEALS. Susy Frizzle, MD Taking  Active   mometasone (ELOCON) 0.1 % ointment [id number] Yes Apply topically daily. Susy Frizzle, MD Taking Active   traMADol Veatrice Bourbon) 50 MG tablet [id number] Yes Take 1 tablet (50 mg total) by mouth every 6 (six) hours as needed. Susy Frizzle, MD Taking Active   triamcinolone cream (KENALOG) 0.1 % [id number] Yes Apply 1 application topically 2 (two) times daily. Susy Frizzle, MD Taking Active   Med List Note Belva Agee, CPhT 01/31/14 2306): Patient was taking zetia 10 mg daily - he can no longer afford this medicaiton  Patient Active Problem List   Diagnosis Date Noted   Thrombocytopenia ([place]) 12/09/2019   DDD (degenerative disc disease), lumbar    HLD (hyperlipidemia) 12/09/2012   Diabetes mellitus without complication ([place])    Hypertension    Smoker     Immunization History  Administered Date(s) Administered   Fluad Quad(high Dose 65+) 06/13/2019, 06/29/2020   Pneumococcal Polysaccharide-23 06/29/2020   Tdap 11/22/2011    Conditions to be addressed/monitored:  hypertension, diabetes, hyperlipidemia, tobacco abuse.  Care Plan : General Pharmacy (Adult)  Updates made by Edythe Clarity, RPH since 03/10/2021 12:00 AM     Problem: HTN, HLD, DM   Priority: High  Onset Date: 09/09/2020     Long-Range Goal: Patient-Specific Goal   Start Date: 09/09/2020  Expected End Date: 03/09/2021  Recent Progress: On track  Priority: High  Note:    Current Barriers:  None identified at this visit  Pharmacist Clinical Goal(s):  Over the next 180 days, patient will achieve adherence to monitoring guidelines and medication adherence to achieve therapeutic efficacy maintain control of blood sugar  as evidenced by A1c  Continue positive lifestyle changes through collaboration with PharmD and provider.   Interventions: 1:1 collaboration with Susy Frizzle, MD regarding development and update of comprehensive plan of care as evidenced by provider  attestation and co-signature Inter-disciplinary care team collaboration (see longitudinal plan of care) Comprehensive medication review performed; medication list updated in electronic medical record  Hypertension (BP goal <140/90) -controlled -Current treatment: None noted -Medications previously tried: none noted -Current home readings: none available, patient does not monitor -Current dietary habits: Following Dr. Arsenio Katz diet he found online eating more fresh vegetables and fish products.  Reports he has lost approx 20 pounds -Current exercise habits: Minimal, does some exercises from Dr. Arsenio Katz for people with disabilities -Denies hypotensive/hypertensive symptoms -Educated on BP goals and benefits of medications for prevention of heart attack, stroke and kidney damage; Importance of home blood pressure monitoring; Symptoms of hypotension and importance of maintaining adequate hydration; -Counseled to monitor BP at home periodically, document, and provide log at future appointments -Recommended to continue current medication Congratulated him on his positive lifestyle changes  Hyperlipidemia: (LDL goal < 100) -controlled -Current treatment: Ezetimibe 43m -Medications previously tried: statins (shaking)  -Current dietary patterns: see above -Current exercise habits: see above -Educated on Importance of limiting foods high in cholesterol; -Recommended to continue current medication  Update 03/10/21 Reports adherence with medication.  Last LDL was WNL.  Recommend repeat lipid panel in December 2022 for routine yearly labs.  Discussed the importance of this with patient.  Continue current meds for now  Diabetes (A1c goal <6.5%) -controlled -Current medications: Metformin 10067mtwice daily -Medications previously tried: none noted  -Current home glucose readings fasting glucose: not monitoring post prandial glucose: not monitoring -Denies hypoglycemic/hyperglycemic  symptoms -Current meal patterns:  Following Diet from Dr. LiArsenio Katzbout eating healthier.  Now eats more raw veggies, limits processed foods. -Current exercise: see above -Educated onA1c and blood sugar goals; Benefits of weight loss; Prevention and management of hypoglycemic episodes; Benefits of routine self-monitoring of blood sugar; -Counseled to check feet daily and get yearly eye exams -Counseled on being aware of hypoglycemia as he loses weight and continues healthy diet  Recommended continue current medications, notify providers if you experience hypoglycemia symptoms  Update 03/10/21 Continues to take medication as directed.  Not currently monitoring sugar at home, but last A1c is excellent.  He is still focusing on diet and limiting  carbs/sugars.  Limited exercise.  Denies any symptoms of hypoglycemia.  No changes - continue current meds for now.   Patient Goals/Self-Care Activities Over the next 120 days, patient will:  - take medications as prescribed target a minimum of 150 minutes of moderate intensity exercise weekly continue positive lifestyle changes  Follow Up Plan: The care management team will reach out to the patient again over the next 180 days.         Medication Assistance: None required.  Patient affirms current coverage meets needs.  Patient's preferred pharmacy is:  [facility] - [place], [address] [place] [address] Phone: [phone] Fax: [phone]  [id number] [id number]: [phone]: [phone] Uses pill box? No  Pt endorses 100% compliance [facility]  We discussed: Current pharmacy is preferred with insurance plan and patient is satisfied with pharmacy  services Patient decided to: Continue current medication management strategy  Care Plan and Follow Up Patient Decision:  Patient agrees to Care Plan and Follow-up.  Plan: The care management team will reach out to the patient again over the next 180 days.  CBeverly Milch PharmD Clinical Pharmacist [facility]([phone]

## 2021-03-10 NOTE — Patient Instructions (Addendum)
Visit Information   Goals Addressed             This Visit's Progress    Lifestyle Change-Hypertension   On track    Timeframe:  Long-Range Goal Priority:  Medium Start Date:    09/09/20                         Expected End Date:    03/09/21                   Follow Up Date 12/21/20   - learn about high blood pressure    Why is this important?   The changes that you are asked to make may be hard to do.  This is especially true when the changes are life-long.  Knowing why it is important to you is the first step.  Working on the change with your family or support person helps you not feel alone.  Reward yourself and family or support person when goals are met. This can be an activity you choose like bowling, hiking, biking, swimming or shooting hoops.     Notes: Continue positive changes with diet and weight loss!!!!       Patient Care Plan: General Pharmacy (Adult)     Problem Identified: HTN, HLD, DM   Priority: High  Onset Date: 09/09/2020     Long-Range Goal: Patient-Specific Goal   Start Date: 09/09/2020  Expected End Date: 03/09/2021  Recent Progress: On track  Priority: High  Note:    Current Barriers:  None identified at this visit  Pharmacist Clinical Goal(s):  Over the next 180 days, patient will achieve adherence to monitoring guidelines and medication adherence to achieve therapeutic efficacy maintain control of blood sugar  as evidenced by A1c  Continue positive lifestyle changes through collaboration with PharmD and provider.   Interventions: 1:1 collaboration with Susy Frizzle, MD regarding development and update of comprehensive plan of care as evidenced by provider attestation and co-signature Inter-disciplinary care team collaboration (see longitudinal plan of care) Comprehensive medication review performed; medication list updated in electronic medical record  Hypertension (BP goal <140/90) -controlled -Current treatment: None  noted -Medications previously tried: none noted -Current home readings: none available, patient does not monitor -Current dietary habits: Following Dr. Arsenio Katz diet he found online eating more fresh vegetables and fish products.  Reports he has lost approx 20 pounds -Current exercise habits: Minimal, does some exercises from Dr. Arsenio Katz for people with disabilities -Denies hypotensive/hypertensive symptoms -Educated on BP goals and benefits of medications for prevention of heart attack, stroke and kidney damage; Importance of home blood pressure monitoring; Symptoms of hypotension and importance of maintaining adequate hydration; -Counseled to monitor BP at home periodically, document, and provide log at future appointments -Recommended to continue current medication Congratulated him on his positive lifestyle changes  Hyperlipidemia: (LDL goal < 100) -controlled -Current treatment: Ezetimibe 42m -Medications previously tried: statins (shaking)  -Current dietary patterns: see above -Current exercise habits: see above -Educated on Importance of limiting foods high in cholesterol; -Recommended to continue current medication  Update 03/10/21 Reports adherence with medication.  Last LDL was WNL.  Recommend repeat lipid panel in December 2022 for routine yearly labs.  Discussed the importance of this with patient.  Continue current meds for now  Diabetes (A1c goal <6.5%) -controlled -Current medications: Metformin 10070mtwice daily -Medications previously tried: none noted  -Current home glucose readings fasting glucose: not monitoring post prandial glucose: not  monitoring -Denies hypoglycemic/hyperglycemic symptoms -Current meal patterns:  Following Diet from Dr. Arsenio Katz about eating healthier.  Now eats more raw veggies, limits processed foods. -Current exercise: see above -Educated onA1c and blood sugar goals; Benefits of weight loss; Prevention and management of  hypoglycemic episodes; Benefits of routine self-monitoring of blood sugar; -Counseled to check feet daily and get yearly eye exams -Counseled on being aware of hypoglycemia as he loses weight and continues healthy diet  Recommended continue current medications, notify providers if you experience hypoglycemia symptoms  Update 03/10/21 Continues to take medication as directed.  Not currently monitoring sugar at home, but last A1c is excellent.  He is still focusing on diet and limiting carbs/sugars.  Limited exercise.  Denies any symptoms of hypoglycemia.  No changes - continue current meds for now.   Patient Goals/Self-Care Activities Over the next 120 days, patient will:  - take medications as prescribed target a minimum of 150 minutes of moderate intensity exercise weekly continue positive lifestyle changes  Follow Up Plan: The care management team will reach out to the patient again over the next 180 days.        Patient verbalizes understanding of instructions provided today and agrees to view in [place].  Telephone follow up appointment with pharmacy team member scheduled for: 6 months  Edythe Clarity, [place]

## 2021-03-17 ENCOUNTER — Other Ambulatory Visit: Payer: Self-pay | Admitting: Family Medicine

## 2021-03-23 ENCOUNTER — Telehealth: Payer: Self-pay | Admitting: Family Medicine

## 2021-03-23 MED ORDER — TRIAMCINOLONE ACETONIDE 0.1 % EX CREA: TOPICAL_CREAM | CUTANEOUS | 0 refills | Status: DC

## 2021-03-23 NOTE — Telephone Encounter (Signed)
Patient called to follow up on refill request for triamcinolone cream (KENALOG) 0.1 % [id number]    Also spoke with wife Apolonio Schneiders. Refill request made 1 week ago. Patient has been out of cream since then. Rx still hasn't been refilled.   Pharmacy confirmed as   [facility] [address] [place]), [place] - [address]  [address] [facility] [place] [address]  Phone:  [phone]  Fax:  [phone]   Please advise at [phone]

## 2021-03-23 NOTE — Telephone Encounter (Signed)
Prescription sent to pharmacy.

## 2021-04-11 ENCOUNTER — Telehealth: Payer: Self-pay | Admitting: Family Medicine

## 2021-04-11 NOTE — Telephone Encounter (Signed)
I attempted to leave message for patient to call back and schedule Medicare Annual Wellness Visit (AWV) in office.Voice mail was full.  If not able to come in office, please offer to do virtually or by telephone.  Left office number and my jabber [phone].  Due for AWVI  Please schedule at anytime with Nurse Health Advisor.

## 2021-05-04 ENCOUNTER — Telehealth: Payer: Self-pay | Admitting: Pharmacist

## 2021-05-04 NOTE — Progress Notes (Signed)
Chronic Care Management Pharmacy Assistant   Name: Ricky Rios  MRN: [id number] DOB: [birth year]  Reason for Encounter: Disease State For HTN.    Conditions to be addressed/monitored: hypertension, diabetes, hyperlipidemia, tobacco abuse.  Recent office visits:  None since 03/10/21  Recent consult visits:  None since 03/10/21  Hospital visits:  None since 03/10/21  Medications: Outpatient Encounter Medications as of 05/04/2021  Medication Sig   ezetimibe (ZETIA) 10 MG tablet Take 1 tablet (10 mg total) by mouth daily.   hydrOXYzine (ATARAX/VISTARIL) 25 MG tablet TAKE 1 TABLET (25 MG TOTAL) BY MOUTH 3 (THREE) TIMES DAILY AS NEEDED. FOR ITCHING   metFORMIN (GLUCOPHAGE) 1000 MG tablet TAKE 1 TABLET (1,000 MG TOTAL) BY MOUTH 2 (TWO) TIMES DAILY WITH MEALS.   mometasone (ELOCON) 0.1 % ointment Apply topically daily.   traMADol (ULTRAM) 50 MG tablet Take 1 tablet (50 mg total) by mouth every 6 (six) hours as needed.   triamcinolone cream (KENALOG) 0.1 % APPLY 1 APPICATION TOPICALLY 2 TIMES DAILY   No facility-administered encounter medications on file as of 05/04/2021.   Reviewed chart prior to disease state call. Spoke with patient regarding BP  Recent Office Vitals: BP Readings from Last 3 Encounters:  06/29/20 140/70  12/24/19 (!) 140/58  12/10/19 138/61   Pulse Readings from Last 3 Encounters:  06/29/20 76  12/24/19 74  12/10/19 73    Wt Readings from Last 3 Encounters:  06/29/20 259 lb (117.5 kg)  12/24/19 267 lb 4.8 oz (121.2 kg)  12/10/19 265 lb 14.4 oz (120.6 kg)     Kidney Function Lab Results  Component Value Date/Time   CREATININE 0.99 06/29/2020 03:53 PM   CREATININE 1.03 12/10/2019 04:26 PM   CREATININE 1.04 12/02/2019 12:02 PM   GFRNONAA 78 06/29/2020 03:53 PM   GFRAA 90 06/29/2020 03:53 PM    BMP Latest Ref Rng & Units 06/29/2020 12/10/2019 12/02/2019  Glucose 65 - 99 mg/dL 104(H) 114(H) 245(H)  BUN 7 - 25 mg/dL 11 17 16   Creatinine 0.70 -  1.25 mg/dL 0.99 1.03 1.04  BUN/Creat Ratio 6 - 22 (calc) NOT APPLICABLE - NOT APPLICABLE  Sodium 025 - 146 mmol/L 139 139 138  Potassium 3.5 - 5.3 mmol/L 4.0 4.1 3.6  Chloride 98 - 110 mmol/L 106 105 103  CO2 20 - 32 mmol/L 23 21(L) 22  Calcium 8.6 - 10.3 mg/dL 9.7 9.8 9.5    Current antihypertensive regimen:  None  How often are you checking your Blood Pressure?  Patient stated he does not check his blood pressure at home.  Current home BP readings: Patient stated he does not check his blood pressure at home.  What recent interventions/DTPs have been made by any provider to improve Blood Pressure control since last CPP Visit: None.   Any recent hospitalizations or ED visits since last visit with CPP? Patient stated no.  What diet changes have been made to improve Blood Pressure Control?  Patient stated he eats out most of the time because its cheaper. He stated he goes to food banks to get food.  What exercise is being done to improve your Blood Pressure Control?  Patient stated he is in pain on from his right leg/back pain. He stated his activity is not good at all.   Adherence Review: Is the patient currently on ACE/ARB medication? N/A.  Does the patient have >5 day gap between last estimated fill dates? N/A.  Care Gaps:Patient is due for eye, foot, colonoscopy, and  needs a updated labs.  Star Rating Drugs:Metformin 1000 mg 10/12/20 90 DS.   Scheduled appointment for AWV on 05/26/21 at 2 pm.   Follow-Up:Pharmacist Review  Charlann Lange, [place] Pharmacist Assistant [phone]

## 2021-05-09 ENCOUNTER — Other Ambulatory Visit: Payer: Self-pay | Admitting: Family Medicine

## 2021-05-26 ENCOUNTER — Ambulatory Visit: Payer: Medicare Other

## 2021-06-02 ENCOUNTER — Other Ambulatory Visit: Payer: Self-pay

## 2021-06-02 ENCOUNTER — Telehealth: Payer: Self-pay

## 2021-06-02 ENCOUNTER — Ambulatory Visit (INDEPENDENT_AMBULATORY_CARE_PROVIDER_SITE_OTHER): Payer: Medicare Other

## 2021-06-02 VITALS — Ht 71.0 in | Wt 259.0 lb

## 2021-06-02 DIAGNOSIS — Z0000 Encounter for general adult medical examination without abnormal findings: Secondary | ICD-10-CM

## 2021-06-02 DIAGNOSIS — Z5941 Food insecurity: Secondary | ICD-10-CM

## 2021-06-02 DIAGNOSIS — Z1211 Encounter for screening for malignant neoplasm of colon: Secondary | ICD-10-CM

## 2021-06-02 NOTE — Telephone Encounter (Signed)
Called pt for AWV. Pt states he does not have supplies to check his blood sugars. Can an Rx for a Glucometer, Testing Strips and Lancets be sent to [facility]? Thank you.

## 2021-06-02 NOTE — Progress Notes (Signed)
Subjective:   Ricky Rios is a 70 y.o. male who presents for an Initial Medicare Annual Wellness Visit. Ricky Rios , Thank you for taking time to come for your Medicare Wellness Visit. I appreciate your ongoing commitment to your health goals. Please review the following plan we discussed and let me know if I can assist you in the future.    Virtual Visit via Telephone Note  I connected with  Ricky Rios on 06/02/21 at 12:00 PM EST by telephone and verified that I am speaking with the correct person using two identifiers.  Location: Patient: Home  Provider: [facility] Persons participating in the virtual visit: patient/Nurse Health Advisor   I discussed the limitations, risks, security and privacy concerns of performing an evaluation and management service by telephone and the availability of in person appointments. The patient expressed understanding and agreed to proceed.  Interactive audio and video telecommunications were attempted between this nurse and patient, however failed, due to patient having technical difficulties OR patient did not have access to video capability.  We continued and completed visit with audio only.  Some vital signs may be absent or patient reported.   Chriss Driver, LPN   Review of Systems     Cardiac Risk Factors include: advanced age (>59men, >52 women);diabetes mellitus;hypertension;sedentary lifestyle;obesity (BMI >30kg/m2);smoking/ tobacco exposure     Objective:    Today's Vitals   06/02/21 1148 06/02/21 1150  Weight: 259 lb (117.5 kg)   Height: 5\' 11"  (1.803 m)   PainSc:  7    Body mass index is 36.12 kg/m.  Advanced Directives 06/02/2021 04/01/2016  Does Patient Have a Medical Advance Directive? No No  Would patient like information on creating a medical advance directive? No - Patient declined -    Current Medications (verified) Outpatient Encounter Medications as of 06/02/2021  Medication Sig   ezetimibe (ZETIA)  10 MG tablet Take 1 tablet (10 mg total) by mouth daily.   fluocinonide ointment (LIDEX) 0.05 %    hydrOXYzine (ATARAX/VISTARIL) 25 MG tablet TAKE 1 TABLET (25 MG TOTAL) BY MOUTH 3 (THREE) TIMES DAILY AS NEEDED. FOR ITCHING   metFORMIN (GLUCOPHAGE) 1000 MG tablet TAKE 1 TABLET TWICE DAILY WITH MEALS   traMADol (ULTRAM) 50 MG tablet Take 1 tablet (50 mg total) by mouth every 6 (six) hours as needed.   [DISCONTINUED] mometasone (ELOCON) 0.1 % ointment Apply topically daily.   [DISCONTINUED] triamcinolone cream (KENALOG) 0.1 % APPLY 1 APPICATION TOPICALLY 2 TIMES DAILY   No facility-administered encounter medications on file as of 06/02/2021.    Allergies (verified) Lipitor [atorvastatin calcium], Sulfa antibiotics, and Zocor [simvastatin - high dose]   History: Past Medical History:  Diagnosis Date   Allergy    [facility]reesia 03/01/2020   Arthritis    [facility]reesia 03/01/2020   Cataract    [facility]reesia 03/01/2020   Chest pain    Diagnostic cardiac cath October 2012. Normal coronary arteries, left ventricular function   COPD (chronic obstructive pulmonary disease) ([place])    [facility]reesia 03/01/2020   DDD (degenerative disc disease), lumbar    Diabetes mellitus    Diabetes mellitus without complication ([place])    [facility]reesia 03/01/2020   History of back surgery    History of neck surgery    HLD (hyperlipidemia) 12/09/2012   Hyperlipidemia    [facility]ia 03/01/2020   Hypertension    Obesity    Right knee injury    Sleep apnea    Smoker    Past Surgical  History:  Procedure Laterality Date   APPENDECTOMY     BACK SURGERY     for ruptured disc lumbar area   CARDIAC CATHETERIZATION  Oct 2012   nml coronary arteries, aorta, LV fcn   EYE SURGERY N/A    Phreesia 03/01/2020   I & D EXTREMITY Right 02/06/2013   Procedure: IRRIGATION AND DEBRIDEMENT INDEX FINGER;  Surgeon: Tennis Must, MD;  Location: [place];  Service: Orthopedics;  Laterality: Right;   NECK SURGERY     for ruptured disc   SPINE SURGERY N/A     [facility]reesia 03/01/2020   TONSILLECTOMY     Family History  Problem Relation Age of Onset   Dementia Mother    Heart disease Father    Social History   Socioeconomic History   Marital status: Married    Spouse name: Apolonio Schneiders   Number of children: 2   Years of education: Not on file   Highest education level: Not on file  Occupational History   Occupation: Disabled    Employer: Psychologist, occupational  Tobacco Use   Smoking status: Every Day    Packs/day: 2.00    Types: Cigarettes   Smokeless tobacco: Never   Tobacco comments:    Vaping more then cigs  Substance and Sexual Activity   Alcohol use: No   Drug use: No   Sexual activity: Not on file  Other Topics Concern   Not on file  Social History Narrative   1 step-son. 1 daughter.    4 grandchildren.   Social Determinants of Health   Financial Resource Strain: Medium Risk   Difficulty of Paying Living Expenses: Somewhat hard  Food Insecurity: Food Insecurity Present   Worried About [place] in the Last Year: Sometimes true   Ran Out of Food in the Last Year: Sometimes true  Transportation Needs: No Transportation Needs   Lack of Transportation (Medical): No   Lack of Transportation (Non-Medical): No  Physical Activity: Insufficiently Active   Days of Exercise per Week: 2 days   Minutes of Exercise per Session: 10 min  Stress: Stress Concern Present   Feeling of Stress : To some extent  Social Connections: Engineer, building services of Communication with Friends and Family: More than three times a week   Frequency of Social Gatherings with Friends and Family: Once a week   Attends Religious Services: 1 to 4 times per year   Active Member of [employer] or Organizations: Yes   Attends Archivist Meetings: 1 to 4 times per year   Marital Status: Married    Tobacco Counseling Ready to quit: Not Answered Counseling given: Not Answered Tobacco comments: Vaping more then cigs   Clinical  Intake:  Pre-visit preparation completed: Yes  Pain : 0-10 Pain Score: 7  Pain Type: Chronic pain Pain Location: Neck Pain Descriptors / Indicators: Aching Pain Onset: More than a month ago Pain Frequency: Intermittent     BMI - recorded: 36.12 Nutritional Status: BMI > 30  Obese Nutritional Risks: None Diabetes: Yes  How often do you need to have someone help you when you read instructions, pamphlets, or other written materials from your doctor or pharmacy?: 1 - Never  Diabetic?Nutrition Risk Assessment:  Has the patient had any N/V/D within the last 2 months?  No  Does the patient have any non-healing wounds?  No  Has the patient had any unintentional weight loss or weight gain?  No   Diabetes:  Is the patient diabetic?  Yes  If diabetic, was a CBG obtained today?  No  Did the patient bring in their glucometer from home?  No  How often do you monitor your CBG's? Does not check blood sugars.   Financial Strains and Diabetes Management:  Are you having any financial strains with the device, your supplies or your medication? No .  Does the patient want to be seen by Chronic Care Management for management of their diabetes?  No  Would the patient like to be referred to a Nutritionist or for Diabetic Management?  No   Diabetic Exams:  Diabetic Eye Exam:. Overdue for diabetic eye exam. Pt has been advised about the importance in completing this exam.   Diabetic Foot Exam: Pt has been advised about the importance in completing this exam.    Interpreter Needed?: No  Information entered by :: MJ Jaion Lagrange, LPN   Activities of Daily Living In your present state of health, do you have any difficulty performing the following activities: 06/02/2021  Hearing? N  Vision? N  Difficulty concentrating or making decisions? Y  Comment Memory  Walking or climbing stairs? N  Dressing or bathing? N  Doing errands, shopping? N  Preparing Food and eating ? N  Using the Toilet? N   In the past six months, have you accidently leaked urine? N  Do you have problems with loss of bowel control? N  Managing your Medications? N  Managing your Finances? N  Housekeeping or managing your Housekeeping? N  Some recent data might be hidden    Patient Care Team: Susy Frizzle, MD as PCP - General (Family Medicine) Edythe Clarity, [facility] as Pharmacist (Pharmacist)  Indicate any recent Medical Services you may have received from other than [facility] providers in the past year (date may be approximate).     Assessment:   This is a routine wellness examination for Ricky Rios.  Hearing/Vision screen Hearing Screening - Comments:: No hearing issues.  Vision Screening - Comments:: Glasses/Readers. Dr. Katy Fitch. 01/06/2019.  Dietary issues and exercise activities discussed: Current Exercise Habits: The patient does not participate in regular exercise at present, Exercise limited by: cardiac condition(s);orthopedic condition(s)   Goals Addressed             This Visit's Progress    Exercise 3x per week (30 min per time)         Depression Screen PHQ 2/9 Scores 06/02/2021 08/09/2017 06/28/2017  PHQ - 2 Score 1 2 1   PHQ- 9 Score - 8 4    Fall Risk Fall Risk  06/02/2021 06/18/2019 02/21/2019 06/28/2017  Falls in the past year? 1 0 (No Data) No  Comment - Emmi Telephone Survey: data to providers prior to load Emmi Telephone Survey: data to providers prior to load -  Number falls in past yr: 0 - (No Data) -  Comment - - Emmi Telephone Survey Actual Response =  -  Injury with Fall? 0 - - -  Risk for fall due to : Impaired balance/gait;Impaired mobility;History of fall(s) - - -  Follow up Falls prevention discussed - - -    FALL RISK PREVENTION PERTAINING TO THE HOME:  Any stairs in or around the home? Yes  If so, are there any without handrails? No  Home free of loose throw rugs in walkways, pet beds, electrical cords, etc? Yes  Adequate lighting in your home to reduce risk  of falls? Yes   ASSISTIVE DEVICES UTILIZED TO PREVENT FALLS:  Life alert? No  Use of a cane, walker or w/c? No  Grab bars in the bathroom? No  Shower chair or bench in shower? No  Elevated toilet seat or a handicapped toilet? No   TIMED UP AND GO:  Was the test performed? No . Phone visit.   Cognitive Function:     6CIT Screen 06/02/2021  What Year? 0 points  What month? 0 points  What time? 0 points  Count back from 20 0 points  Months in reverse 0 points  Repeat phrase 0 points  Total Score 0    Immunizations Immunization History  Administered Date(s) Administered   Fluad Quad(high Dose 65+) 06/13/2019, 06/29/2020   Pneumococcal Polysaccharide-23 06/29/2020   Tdap 11/22/2011    TDAP status: Up to date  Flu Vaccine status: Due, Education has been provided regarding the importance of this vaccine. Advised may receive this vaccine at local pharmacy or Health Dept. Aware to provide a copy of the vaccination record if obtained from local pharmacy or Health Dept. Verbalized acceptance and understanding.  Pneumococcal vaccine status: Due, Education has been provided regarding the importance of this vaccine. Advised may receive this vaccine at local pharmacy or Health Dept. Aware to provide a copy of the vaccination record if obtained from local pharmacy or Health Dept. Verbalized acceptance and understanding.  Covid-19 vaccine status: Declined, Education has been provided regarding the importance of this vaccine but patient still declined. Advised may receive this vaccine at local pharmacy or Health Dept.or vaccine clinic. Aware to provide a copy of the vaccination record if obtained from local pharmacy or Health Dept. Verbalized acceptance and understanding.  Qualifies for Shingles Vaccine? Yes   Zostavax completed No   Shingrix Completed?: No.    Education has been provided regarding the importance of this vaccine. Patient has been advised to call insurance company to  determine out of pocket expense if they have not yet received this vaccine. Advised may also receive vaccine at local pharmacy or Health Dept. Verbalized acceptance and understanding.  Screening Tests Health Maintenance  Topic Date Due   COVID-19 Vaccine (1) Never done   FOOT EXAM  Never done   COLONOSCOPY (Pts 45-69yrs Insurance coverage will need to be confirmed)  Never done   Zoster Vaccines- Shingrix (1 of 2) Never done   OPHTHALMOLOGY EXAM  01/06/2020   HEMOGLOBIN A1C  12/28/2020   INFLUENZA VACCINE  02/21/2021   Pneumonia Vaccine 69+ Years old (2 - PCV) 06/29/2021   URINE MICROALBUMIN  06/29/2021   TETANUS/TDAP  11/21/2021   Hepatitis C Screening  Completed   HPV VACCINES  Aged Out    Health Maintenance  Health Maintenance Due  Topic Date Due   COVID-19 Vaccine (1) Never done   FOOT EXAM  Never done   COLONOSCOPY (Pts 45-51yrs Insurance coverage will need to be confirmed)  Never done   Zoster Vaccines- Shingrix (1 of 2) Never done   OPHTHALMOLOGY EXAM  01/06/2020   HEMOGLOBIN A1C  12/28/2020   INFLUENZA VACCINE  02/21/2021   Pneumonia Vaccine 42+ Years old (2 - PCV) 06/29/2021   URINE MICROALBUMIN  06/29/2021    Colorectal cancer screening: Referral to GI placed 06/02/2021. Pt aware the office will call re: appt.  Lung Cancer Screening: (Low Dose CT Chest recommended if Age 29-80 years, 30 pack-year currently smoking OR have quit w/in 15years.) does qualify.   Lung Cancer Screening Referral: Pt declined at this time.  Additional Screening:  Hepatitis C Screening: does qualify;  Completed 12/26/2013  Vision Screening: Recommended annual ophthalmology exams for early detection of glaucoma and other disorders of the eye. Is the patient up to date with their annual eye exam?  No  Who is the provider or what is the name of the office in which the patient attends annual eye exams? Dr. Katy Fitch If pt is not established with a provider, would they like to be referred to a  provider to establish care? No .   Dental Screening: Recommended annual dental exams for proper oral hygiene  Community Resource Referral / Chronic Care Management: CRR required this visit?  Yes   CCM required this visit?  Yes      Plan:     I have personally reviewed and noted the following in the patient's chart:   Medical and social history Use of alcohol, tobacco or illicit drugs  Current medications and supplements including opioid prescriptions. Patient is not currently taking opioid prescriptions. Functional ability and status Nutritional status Physical activity Advanced directives List of other physicians Hospitalizations, surgeries, and ER visits in previous 12 months Vitals Screenings to include cognitive, depression, and falls Referrals and appointments  In addition, I have reviewed and discussed with patient certain preventive protocols, quality metrics, and best practice recommendations. A written personalized care plan for preventive services as well as general preventive health recommendations were provided to patient.     Chriss Driver, LPN   00/51/1021   Nurse Notes: Pt c/o food insecurity. Offered referral to [facility] and pt accepted. Referral sent today. Pt would like to schedule colonoscopy for after first of year. Order placed today. Discussed vaccines. Overdue for eye exam and foot exam. Pt advised.

## 2021-06-02 NOTE — Patient Instructions (Signed)
Ricky Rios , Thank you for taking time to come for your Medicare Wellness Visit. I appreciate your ongoing commitment to your health goals. Please review the following plan we discussed and let me know if I can assist you in the future.   Screening recommendations/referrals: Colonoscopy: Order placed today to schedule for after first of year.  Recommended yearly ophthalmology/optometry visit for glaucoma screening and checkup Recommended yearly dental visit for hygiene and checkup  Vaccinations: Influenza vaccine: Scheduled for 06/20/2021 Pneumococcal vaccine: Done 06/29/2020 Tdap vaccine: Done 11/22/2011 Repeat in 10 years  Shingles vaccine: Shingrix discussed. Please contact your pharmacy for coverage information.     Covid-19: Declined.  Advanced directives: Advance directive discussed with you today. Even though you declined this today, please call our office should you change your mind, and we can give you the proper paperwork for you to fill out.   Conditions/risks identified: Aim for 30 minutes of exercise or brisk walking each day, drink 6-8 glasses of water and eat lots of fruits and vegetables.   Next appointment: Follow up in one year for your annual wellness visit. 2023.   Preventive Care 69 Years and Older, Male  Preventive care refers to lifestyle choices and visits with your health care provider that can promote health and wellness. What does preventive care include? A yearly physical exam. This is also called an annual well check. Dental exams once or twice a year. Routine eye exams. Ask your health care provider how often you should have your eyes checked. Personal lifestyle choices, including: Daily care of your teeth and gums. Regular physical activity. Eating a healthy diet. Avoiding tobacco and drug use. Limiting alcohol use. Practicing safe sex. Taking low doses of aspirin every day. Taking vitamin and mineral supplements as recommended by your health  care provider. What happens during an annual well check? The services and screenings done by your health care provider during your annual well check will depend on your age, overall health, lifestyle risk factors, and family history of disease. Counseling  Your health care provider may ask you questions about your: Alcohol use. Tobacco use. Drug use. Emotional well-being. Home and relationship well-being. Sexual activity. Eating habits. History of falls. Memory and ability to understand (cognition). Work and work Statistician. Screening  You may have the following tests or measurements: Height, weight, and BMI. Blood pressure. Lipid and cholesterol levels. These may be checked every 5 years, or more frequently if you are over 75 years old. Skin check. Lung cancer screening. You may have this screening every year starting at age 69 if you have a 30-pack-year history of smoking and currently smoke or have quit within the past 15 years. Fecal occult blood test (FOBT) of the stool. You may have this test every year starting at age 69. Flexible sigmoidoscopy or colonoscopy. You may have a sigmoidoscopy every 5 years or a colonoscopy every 10 years starting at age 69. Prostate cancer screening. Recommendations will vary depending on your family history and other risks. Hepatitis C blood test. Hepatitis B blood test. Sexually transmitted disease (STD) testing. Diabetes screening. This is done by checking your blood sugar (glucose) after you have not eaten for a while (fasting). You may have this done every 1-3 years. Abdominal aortic aneurysm (AAA) screening. You may need this if you are a current or former smoker. Osteoporosis. You may be screened starting at age 69 if you are at high risk. Talk with your health care provider about your test results, treatment options,  and if necessary, the need for more tests. Vaccines  Your health care provider may recommend certain vaccines, such  as: Influenza vaccine. This is recommended every year. Tetanus, diphtheria, and acellular pertussis (Tdap, Td) vaccine. You may need a Td booster every 10 years. Zoster vaccine. You may need this after age 75. Pneumococcal 13-valent conjugate (PCV13) vaccine. One dose is recommended after age 98. Pneumococcal polysaccharide (PPSV23) vaccine. One dose is recommended after age 60. Talk to your health care provider about which screenings and vaccines you need and how often you need them. This information is not intended to replace advice given to you by your health care provider. Make sure you discuss any questions you have with your health care provider. Document Released: 08/06/2015 Document Revised: 03/29/2016 Document Reviewed: 05/11/2015 Elsevier Interactive Patient Education  2017 [place] Prevention in the Home Falls can cause injuries. They can happen to people of all ages. There are many things you can do to make your home safe and to help prevent falls. What can I do on the outside of my home? Regularly fix the edges of walkways and driveways and fix any cracks. Remove anything that might make you trip as you walk through a door, such as a raised step or threshold. Trim any bushes or trees on the path to your home. Use bright outdoor lighting. Clear any walking paths of anything that might make someone trip, such as rocks or tools. Regularly check to see if handrails are loose or broken. Make sure that both sides of any steps have handrails. Any raised decks and porches should have guardrails on the edges. Have any leaves, snow, or ice cleared regularly. Use sand or salt on walking paths during winter. Clean up any spills in your garage right away. This includes oil or grease spills. What can I do in the bathroom? Use night lights. Install grab bars by the toilet and in the tub and shower. Do not use towel bars as grab bars. Use non-skid mats or decals in the tub or  shower. If you need to sit down in the shower, use a plastic, non-slip stool. Keep the floor dry. Clean up any water that spills on the floor as soon as it happens. Remove soap buildup in the tub or shower regularly. Attach bath mats securely with double-sided non-slip rug tape. Do not have throw rugs and other things on the floor that can make you trip. What can I do in the bedroom? Use night lights. Make sure that you have a light by your bed that is easy to reach. Do not use any sheets or blankets that are too big for your bed. They should not hang down onto the floor. Have a firm chair that has side arms. You can use this for support while you get dressed. Do not have throw rugs and other things on the floor that can make you trip. What can I do in the kitchen? Clean up any spills right away. Avoid walking on wet floors. Keep items that you use a lot in easy-to-reach places. If you need to reach something above you, use a strong step stool that has a grab bar. Keep electrical cords out of the way. Do not use floor polish or wax that makes floors slippery. If you must use wax, use non-skid floor wax. Do not have throw rugs and other things on the floor that can make you trip. What can I do with my stairs? Do not leave  any items on the stairs. Make sure that there are handrails on both sides of the stairs and use them. Fix handrails that are broken or loose. Make sure that handrails are as long as the stairways. Check any carpeting to make sure that it is firmly attached to the stairs. Fix any carpet that is loose or worn. Avoid having throw rugs at the top or bottom of the stairs. If you do have throw rugs, attach them to the floor with carpet tape. Make sure that you have a light switch at the top of the stairs and the bottom of the stairs. If you do not have them, ask someone to add them for you. What else can I do to help prevent falls? Wear shoes that: Do not have high heels. Have  rubber bottoms. Are comfortable and fit you well. Are closed at the toe. Do not wear sandals. If you use a stepladder: Make sure that it is fully opened. Do not climb a closed stepladder. Make sure that both sides of the stepladder are locked into place. Ask someone to hold it for you, if possible. Clearly mark and make sure that you can see: Any grab bars or handrails. First and last steps. Where the edge of each step is. Use tools that help you move around (mobility aids) if they are needed. These include: Canes. Walkers. Scooters. Crutches. Turn on the lights when you go into a dark area. Replace any light bulbs as soon as they burn out. Set up your furniture so you have a clear path. Avoid moving your furniture around. If any of your floors are uneven, fix them. If there are any pets around you, be aware of where they are. Review your medicines with your doctor. Some medicines can make you feel dizzy. This can increase your chance of falling. Ask your doctor what other things that you can do to help prevent falls. This information is not intended to replace advice given to you by your health care provider. Make sure you discuss any questions you have with your health care provider. Document Released: 05/06/2009 Document Revised: 12/16/2015 Document Reviewed: 08/14/2014 Elsevier Interactive Patient Education  2017 [employer].

## 2021-06-07 NOTE — Telephone Encounter (Signed)
NPI: [id number]

## 2021-06-09 ENCOUNTER — Telehealth: Payer: Self-pay

## 2021-06-09 NOTE — Telephone Encounter (Signed)
Due to patient's insurance, they will not cover the Glucometer, Lancets and Testing Strips through his [place] benefit but will cover the equipment through a local pharmacy. Rx for glucometer, lancets and testing strips called to [facility] [address] as ordered per Dr. Dennard Schaumann. Pt aware of all. Mjp,lpn

## 2021-06-09 NOTE — Telephone Encounter (Signed)
   Telephone encounter was:  Successful.  06/09/2021 Name: Ricky Rios MRN: [id number] DOB: 1950/10/25  Ricky Rios is a 70 y.o. year old male who is a primary care patient of Pickard, Cammie Mcgee, MD . The community resource team was consulted for assistance with [place] guide performed the following interventions:  Spoke to pt and wife about food pantries, fresh mobile market, meals on wheels, senior resources and provided information on [employer] and dss assisting with rent and utility bills if assistance is needed. Information will be sent in mail. .  Follow Up Plan:  Care guide will follow up with patient by phone over the next few days ensuring mail was received and to go over any questions or concerns.  [place] management  [place], [place]  Main Phone: [phone]  E-mail: [email or web address]  Website: [email or web address]

## 2021-06-20 ENCOUNTER — Ambulatory Visit (INDEPENDENT_AMBULATORY_CARE_PROVIDER_SITE_OTHER): Payer: Medicare Other | Admitting: Family Medicine

## 2021-06-20 ENCOUNTER — Encounter: Payer: Self-pay | Admitting: Family Medicine

## 2021-06-20 ENCOUNTER — Other Ambulatory Visit: Payer: Self-pay

## 2021-06-20 VITALS — BP 132/68 | HR 72 | Temp 97.8°F | Resp 18 | Ht 71.0 in | Wt 255.0 lb

## 2021-06-20 DIAGNOSIS — Z125 Encounter for screening for malignant neoplasm of prostate: Secondary | ICD-10-CM

## 2021-06-20 DIAGNOSIS — E118 Type 2 diabetes mellitus with unspecified complications: Secondary | ICD-10-CM

## 2021-06-20 DIAGNOSIS — Z23 Encounter for immunization: Secondary | ICD-10-CM

## 2021-06-20 DIAGNOSIS — R3912 Poor urinary stream: Secondary | ICD-10-CM | POA: Diagnosis not present

## 2021-06-20 DIAGNOSIS — I10 Essential (primary) hypertension: Secondary | ICD-10-CM

## 2021-06-20 DIAGNOSIS — Z1211 Encounter for screening for malignant neoplasm of colon: Secondary | ICD-10-CM

## 2021-06-20 DIAGNOSIS — E785 Hyperlipidemia, unspecified: Secondary | ICD-10-CM

## 2021-06-20 DIAGNOSIS — K7581 Nonalcoholic steatohepatitis (NASH): Secondary | ICD-10-CM | POA: Diagnosis not present

## 2021-06-20 NOTE — Progress Notes (Signed)
Subjective:    Patient ID: Ricky Rios, male    DOB: Aug 30, 1950, 70 y.o.   MRN: [id number]  HPI  Echocardiogram last year revealed moderate aortic valve calcifications and mild aortic valve stenosis causing his murmur.  Patient is overdue for a colonoscopy.  Today he agrees to allow me to schedule him for a colonoscopy.  He is also due for lung cancer screening as he continues to smoke.  We discussed this in detail today.  He would like to think about lung cancer screening and then he will let me know if he wants to schedule.  His blood pressure today is excellent.  He is not checking his sugars regularly.  He denies any polyuria polydipsia.  He denies any frequent hypoglycemic episodes.  He continues to have low back pain with occasional neuropathic pain radiating into his right leg.  He is due for a PSA to screen for prostate cancer Past Medical History:  Diagnosis Date   Allergy    Phreesia 03/01/2020   Arthritis    Phreesia 03/01/2020   Cataract    Phreesia 03/01/2020   Chest pain    Diagnostic cardiac cath October 2012. Normal coronary arteries, left ventricular function   COPD (chronic obstructive pulmonary disease) ([place])    Phreesia 03/01/2020   DDD (degenerative disc disease), lumbar    Diabetes mellitus    Diabetes mellitus without complication ([place])    Phreesia 03/01/2020   History of back surgery    History of neck surgery    HLD (hyperlipidemia) 12/09/2012   Hyperlipidemia    Phreesia 03/01/2020   Hypertension    Obesity    Right knee injury    Sleep apnea    Smoker    Past Surgical History:  Procedure Laterality Date   APPENDECTOMY     BACK SURGERY     for ruptured disc lumbar area   CARDIAC CATHETERIZATION  Oct 2012   nml coronary arteries, aorta, LV fcn   EYE SURGERY N/A    Phreesia 03/01/2020   I & D EXTREMITY Right 02/06/2013   Procedure: IRRIGATION AND DEBRIDEMENT INDEX FINGER;  Surgeon: Tennis Must, MD;  Location: [facility];  Service: Orthopedics;   Laterality: Right;   NECK SURGERY     for ruptured disc   SPINE SURGERY N/A    Phreesia 03/01/2020   TONSILLECTOMY     Current Outpatient Medications on File Prior to Visit  Medication Sig Dispense Refill   ezetimibe (ZETIA) 10 MG tablet Take 1 tablet (10 mg total) by mouth daily. 90 tablet 3   fluocinonide ointment (LIDEX) 0.05 %      hydrOXYzine (ATARAX/VISTARIL) 25 MG tablet TAKE 1 TABLET (25 MG TOTAL) BY MOUTH 3 (THREE) TIMES DAILY AS NEEDED. FOR ITCHING 180 tablet 0   metFORMIN (GLUCOPHAGE) 1000 MG tablet TAKE 1 TABLET TWICE DAILY WITH MEALS 180 tablet 0   traMADol (ULTRAM) 50 MG tablet Take 1 tablet (50 mg total) by mouth every 6 (six) hours as needed. 30 tablet 0   No current facility-administered medications on file prior to visit.   Allergies  Allergen Reactions   Lipitor [Atorvastatin Calcium] Other (See Comments)    shaking   Sulfa Antibiotics Other (See Comments)    unknown   Zocor [Simvastatin - High Dose] Other (See Comments)    shaking   Social History   Socioeconomic History   Marital status: Married    Spouse name: Apolonio Schneiders   Number of children: 2  Years of education: Not on file   Highest education level: Not on file  Occupational History   Occupation: Disabled    Employer: [employer]  Tobacco Use   Smoking status: Every Day    Packs/day: 2.00    Types: Cigarettes   Smokeless tobacco: Never   Tobacco comments:    Vaping more then cigs  Substance and Sexual Activity   Alcohol use: No   Drug use: No   Sexual activity: Not on file  Other Topics Concern   Not on file  Social History Narrative   1 step-son. 1 daughter.    4 grandchildren.   Social Determinants of Health   Financial Resource Strain: Medium Risk   Difficulty of Paying Living Expenses: Somewhat hard  Food Insecurity: Food Insecurity Present   Worried About [place] in the Last Year: Sometimes true   Ran Out of Food in the Last Year: Sometimes true   Transportation Needs: No Transportation Needs   Lack of Transportation (Medical): No   Lack of Transportation (Non-Medical): No  Physical Activity: Insufficiently Active   Days of Exercise per Week: 2 days   Minutes of Exercise per Session: 10 min  Stress: Stress Concern Present   Feeling of Stress : To some extent  Social Connections: Engineer, building services of Communication with Friends and Family: More than three times a week   Frequency of Social Gatherings with Friends and Family: Once a week   Attends Religious Services: 1 to 4 times per year   Active Member of [employer] or Organizations: Yes   Attends Archivist Meetings: 1 to 4 times per year   Marital Status: Married  Human resources officer Violence: Not At Risk   Fear of Current or Ex-Partner: No   Emotionally Abused: No   Physically Abused: No   Sexually Abused: No     Review of Systems  All other systems reviewed and are negative.     Objective:   Physical Exam Vitals reviewed.  Constitutional:      Appearance: Normal appearance. He is obese.  Cardiovascular:     Rate and Rhythm: Normal rate and regular rhythm.     Pulses: Normal pulses.     Heart sounds: Murmur heard.    No gallop.  Pulmonary:     Effort: Pulmonary effort is normal. No respiratory distress.     Breath sounds: Normal breath sounds. No stridor. No wheezing, rhonchi or rales.  Chest:     Chest wall: No tenderness.  Musculoskeletal:     Right lower leg: No edema.     Left lower leg: No edema.  Neurological:     Mental Status: He is alert.          Assessment & Plan:  Controlled type 2 diabetes mellitus with complication, without long-term current use of insulin ([place]) - Plan: Hemoglobin A1c, CBC with Differential/Platelet, COMPLETE METABOLIC PANEL WITH GFR, Lipid panel, Microalbumin, urine  Essential hypertension - Plan: Hemoglobin A1c, CBC with Differential/Platelet, COMPLETE METABOLIC PANEL WITH GFR, Lipid panel,  Microalbumin, urine  Hyperlipidemia, unspecified hyperlipidemia type - Plan: Hemoglobin A1c, CBC with Differential/Platelet, COMPLETE METABOLIC PANEL WITH GFR, Lipid panel, Microalbumin, urine  NASH (nonalcoholic steatohepatitis) - Plan: Hemoglobin A1c, CBC with Differential/Platelet, COMPLETE METABOLIC PANEL WITH GFR, Lipid panel, Microalbumin, urine  Prostate cancer screening - Plan: PSA, Medicare, PSA  Poor urinary stream - Plan: PSA  Need for immunization against influenza - Plan: Flu Vaccine QUAD High Dose(Fluad)  Need for pneumococcal vaccination - Plan: Pneumococcal conjugate vaccine 20-valent (Prevnar 20)  Schedule the patient for a colonoscopy to screen for colon cancer.  Check PSA today to screen for prostate cancer.  Patient received a flu shot as well as Prevnar 20.  We discussed the CT scan for lung cancer screening and at the present time, the patient defers that.  He will call me back whenever he wants me to schedule this.  Blood pressures acceptable.  I will check a hemoglobin A1c.  Goal hemoglobin A1c is less than 6.5.  Check a fasting lipid panel.  Goal LDL cholesterol is less than 100.  Monitor liver function test to evaluate for any recurrence/active inflammation related to NASH.  Continue to encourage smoking cessation

## 2021-06-21 ENCOUNTER — Other Ambulatory Visit: Payer: Self-pay

## 2021-06-21 LAB — CBC WITH DIFFERENTIAL/PLATELET
Absolute Monocytes: 448 cells/uL (ref 200–950)
Basophils Absolute: 109 cells/uL (ref 0–200)
Basophils Relative: 1.7 %
Eosinophils Absolute: 474 cells/uL (ref 15–500)
Eosinophils Relative: 7.4 %
HCT: 41.5 % (ref 38.5–50.0)
Hemoglobin: 13.8 g/dL (ref 13.2–17.1)
Lymphs Abs: 1856 cells/uL (ref 850–3900)
MCH: 29.7 pg (ref 27.0–33.0)
MCHC: 33.3 g/dL (ref 32.0–36.0)
MCV: 89.2 fL (ref 80.0–100.0)
MPV: 10.3 fL (ref 7.5–12.5)
Monocytes Relative: 7 %
Neutro Abs: 3514 cells/uL (ref 1500–7800)
Neutrophils Relative %: 54.9 %
Platelets: 81 10*3/uL — ABNORMAL LOW (ref 140–400)
RBC: 4.65 10*6/uL (ref 4.20–5.80)
RDW: 14.5 % (ref 11.0–15.0)
Total Lymphocyte: 29 %
WBC: 6.4 10*3/uL (ref 3.8–10.8)

## 2021-06-21 LAB — COMPLETE METABOLIC PANEL WITH GFR
AG Ratio: 1.3 (calc) (ref 1.0–2.5)
ALT: 18 U/L (ref 9–46)
AST: 29 U/L (ref 10–35)
Albumin: 4 g/dL (ref 3.6–5.1)
Alkaline phosphatase (APISO): 70 U/L (ref 35–144)
BUN: 12 mg/dL (ref 7–25)
CO2: 26 mmol/L (ref 20–32)
Calcium: 9.4 mg/dL (ref 8.6–10.3)
Chloride: 106 mmol/L (ref 98–110)
Creat: 0.82 mg/dL (ref 0.70–1.35)
Globulin: 3.1 g/dL (calc) (ref 1.9–3.7)
Glucose, Bld: 96 mg/dL (ref 65–99)
Potassium: 4 mmol/L (ref 3.5–5.3)
Sodium: 140 mmol/L (ref 135–146)
Total Bilirubin: 1 mg/dL (ref 0.2–1.2)
Total Protein: 7.1 g/dL (ref 6.1–8.1)
eGFR: 95 mL/min/{1.73_m2} (ref >=60)

## 2021-06-21 LAB — LIPID PANEL
Cholesterol: 160 mg/dL (ref <200)
HDL: 43 mg/dL (ref >=40)
LDL Cholesterol (Calc): 93 mg/dL (calc)
Non-HDL Cholesterol (Calc): 117 mg/dL (calc) (ref <130)
Total CHOL/HDL Ratio: 3.7 (calc) (ref <5.0)
Triglycerides: 139 mg/dL (ref <150)

## 2021-06-21 LAB — HEMOGLOBIN A1C
Hgb A1c MFr Bld: 5.1 % of total Hgb (ref <5.7)
Mean Plasma Glucose: 100 mg/dL
eAG (mmol/L): 5.5 mmol/L

## 2021-06-21 LAB — MICROALBUMIN, URINE: Microalb, Ur: 82.6 mg/dL

## 2021-06-21 LAB — PSA: PSA: 0.04 ng/mL (ref <=4.00)

## 2021-06-23 ENCOUNTER — Other Ambulatory Visit: Payer: Self-pay | Admitting: Family Medicine

## 2021-06-23 DIAGNOSIS — Z122 Encounter for screening for malignant neoplasm of respiratory organs: Secondary | ICD-10-CM

## 2021-06-23 DIAGNOSIS — F17210 Nicotine dependence, cigarettes, uncomplicated: Secondary | ICD-10-CM

## 2021-06-23 DIAGNOSIS — F17200 Nicotine dependence, unspecified, uncomplicated: Secondary | ICD-10-CM

## 2021-06-27 ENCOUNTER — Telehealth: Payer: Self-pay

## 2021-06-27 NOTE — Telephone Encounter (Signed)
   Telephone encounter was:  Successful.  06/27/2021 Name: Ricky Rios MRN: [id number] DOB: 1951-03-12  Ricky Rios is a 70 y.o. year old male who is a primary care patient of Pickard, Cammie Mcgee, MD . The community resource team was consulted for assistance with [place] guide performed the following interventions:  Spoke to pt advising everything that will be coming in the mail and inquired if pt or spouse had any questions or concerns at this time and pt advised no. I will touch base shortly after mail is sent to ensure they have everything.  Mail sent -  Food Pantries, [employer], Meals on Wheels, [employer] and provided information on [employer] and DSS assisting with rent and utility bills if assistance is needed.    Follow Up Plan:  Care guide will follow up with patient by phone over the next few days to ensure mail was received.   [place] management  [place], [place]  Main Phone: [phone]  E-mail: [email or web address]  Website: [email or web address]

## 2021-07-07 ENCOUNTER — Telehealth: Payer: Self-pay

## 2021-07-07 ENCOUNTER — Telehealth: Payer: Self-pay | Admitting: Pharmacist

## 2021-07-07 NOTE — Progress Notes (Signed)
° ° °  Chronic Care Management Pharmacy Assistant   Name: Ricky Rios  MRN: [id number] DOB: 10-Jul-1951   Reason for Encounter: Disease State - General Adherence Call     Recent office visits:  06/20/21 Ricky Luo, MD (PCP) - Family Medicine - Diabetes - Labs were drawn. Referral to Gastroenterology.Prevnar 20 vaccine administered. Flu vaccine administered. Follow up as scheduled.    Recent consult visits:  None noted.   Hospital visits:  None in previous 6 months  Medications: Outpatient Encounter Medications as of 07/07/2021  Medication Sig   ezetimibe (ZETIA) 10 MG tablet Take 1 tablet (10 mg total) by mouth daily.   fluocinonide ointment (LIDEX) 0.05 %    hydrOXYzine (ATARAX/VISTARIL) 25 MG tablet TAKE 1 TABLET (25 MG TOTAL) BY MOUTH 3 (THREE) TIMES DAILY AS NEEDED. FOR ITCHING   traMADol (ULTRAM) 50 MG tablet Take 1 tablet (50 mg total) by mouth every 6 (six) hours as needed.   No facility-administered encounter medications on file as of 07/07/2021.    Have you had any problems recently with your health? Patient denied having and problems with is health recently. He is having some chronic bone pain with the weather this has increased.   Have you had any problems with your pharmacy? Patient denied any problems with his pharmacy.   What issues or side effects are you having with your medications? Patient denied any issues or side effects with his current medications.  What would you like me to pass along to Ricky Rios, CPP for them to help you with?  Patient reported his AIC is now 5.0 so he has been able to stop taking Metformin. He has not gotten to the [facility] yet as discussed before.   What can we do to take care of you better? Patient did not have any recommendations at this time.   Care Gaps  AWV: done 06/02/21 Colonoscopy: unknown  DM Eye Exam: due 01/06/20 DM Foot Exam: never Microalbumin: done 06/29/20 HbgAIC: done 06/20/21 (5.1) DEXA:  N/A Mammogram: diagnostic 07/13/17  Star Rating Drugs:  No star rating drugs noted.   Future Appointments  Date Time Provider [place]  07/21/2021  2:40 PM [facility] 1 [facility]  09/15/2021  2:45 PM [facility]-CCM PHARMACIST [facility] None  06/08/2022 12:00 PM [facility]-NURSE HEALTH ADVISOR [facility] None   Ricky Rios, [facility] Clinical Pharmacist Assistant  [phone]

## 2021-07-07 NOTE — Telephone Encounter (Signed)
° °  Telephone encounter was:  Successful.  07/07/2021 Name: Ricky Rios MRN: [id number] DOB: 1950-10-11  Ricky Rios is a 70 y.o. year old male who is a primary care patient of Pickard, Cammie Mcgee, MD . The community resource team was consulted for assistance with [place] guide performed the following interventions:  Mailed out: Food Pantries, [employer], meals on wheels, senior resources. - Patient advised he received every documentation in mail and at this time he does not have any further needs, questions or concerns. Pt was advised to contact me if and when needed.  Follow Up Plan:  No further follow up planned at this time. The patient has been provided with needed resources. Currently, pt advised he does not need any other resources.  [place] management  [place], [place]  Main Phone: [phone]   E-mail: [email or web address]  Website: [email or web address]

## 2021-07-21 ENCOUNTER — Ambulatory Visit
Admission: RE | Admit: 2021-07-21 | Discharge: 2021-07-21 | Disposition: A | Discharge status: Home / Self Care | Payer: Medicare Other | Source: Ambulatory Visit | Attending: Family Medicine | Admitting: Family Medicine

## 2021-07-21 DIAGNOSIS — F17200 Nicotine dependence, unspecified, uncomplicated: Secondary | ICD-10-CM

## 2021-07-21 DIAGNOSIS — Z122 Encounter for screening for malignant neoplasm of respiratory organs: Secondary | ICD-10-CM

## 2021-07-21 DIAGNOSIS — F17210 Nicotine dependence, cigarettes, uncomplicated: Secondary | ICD-10-CM

## 2021-08-30 ENCOUNTER — Other Ambulatory Visit: Payer: Self-pay | Admitting: Family Medicine

## 2021-09-01 NOTE — Progress Notes (Signed)
Chronic Care Management Pharmacy Note  09/15/2021 Name:  Ricky Rios MRN:  [id number] DOB:  1950-11-06  Subjective: Ricky Rios is an 71 y.o. year old male who is a primary patient of Pickard, Cammie Mcgee, MD.  The CCM team was consulted for assistance with disease management and care coordination needs.    Engaged with patient by telephone for follow up visit in response to provider referral for pharmacy case management and/or care coordination services.   Consent to Services:  The patient was given the following information about Chronic Care Management services today, agreed to services, and gave verbal consent: 1. CCM service includes personalized support from designated clinical staff supervised by the primary care provider, including individualized plan of care and coordination with other care providers 2. 24/7 contact phone numbers for assistance for urgent and routine care needs. 3. Service will only be billed when office clinical staff spend 20 minutes or more in a month to coordinate care. 4. Only one practitioner may furnish and bill the service in a calendar month. 5.The patient may stop CCM services at any time (effective at the end of the month) by phone call to the office staff. 6. The patient will be responsible for cost sharing (co-pay) of up to 20% of the service fee (after annual deductible is met). Patient agreed to services and consent obtained.  Patient Care Team: Susy Frizzle, MD as PCP - General (Family Medicine) Edythe Clarity, [facility] as Pharmacist (Pharmacist)  Recent office visits:  06/20/21 Jenna Luo, MD (PCP) - Family Medicine - Diabetes - Labs were drawn. Referral to Gastroenterology.Prevnar 20 vaccine administered. Flu vaccine administered. Follow up as scheduled.     Recent consult visits:  None noted.    Hospital visits:  None in previous 6 months  Objective:  Lab Results  Component Value Date   CREATININE 0.82 06/20/2021   BUN  12 06/20/2021   GFRNONAA 78 06/29/2020   GFRAA 90 06/29/2020   NA 140 06/20/2021   K 4.0 06/20/2021   CALCIUM 9.4 06/20/2021   CO2 26 06/20/2021    Lab Results  Component Value Date/Time   HGBA1C 5.1 06/20/2021 12:26 PM   HGBA1C 5.6 06/29/2020 03:53 PM   HGBA1C 11.8 (H) 10/19/2014 11:03 AM   MICROALBUR 82.6 06/20/2021 12:26 PM   MICROALBUR 27.4 06/29/2020 03:53 PM    Last diabetic Eye exam:  Lab Results  Component Value Date/Time   HMDIABEYEEXA No Retinopathy 01/06/2019 12:00 AM    Last diabetic Foot exam: No results found for: HMDIABFOOTEX   Lab Results  Component Value Date   CHOL 160 06/20/2021   HDL 43 06/20/2021   LDLCALC 93 06/20/2021   TRIG 139 06/20/2021   CHOLHDL 3.7 06/20/2021    Hepatic Function Latest Ref Rng & Units 06/20/2021 06/29/2020 12/10/2019  Total Protein 6.1 - 8.1 g/dL 7.1 7.2 7.8  Albumin 3.5 - 5.0 g/dL - - 3.7  AST 10 - 35 U/L 29 35 48(H)  ALT 9 - 46 U/L '18 24 28  ' Alk Phosphatase 38 - 126 U/L - - 92  Total Bilirubin 0.2 - 1.2 mg/dL 1.0 0.9 0.9  Bilirubin, Direct 0.0 - 0.3 mg/dL - - -    Lab Results  Component Value Date/Time   TSH 3.551 05/02/2011 03:16 AM    CBC Latest Ref Rng & Units 06/20/2021 06/29/2020 12/10/2019  WBC 3.8 - 10.8 Thousand/uL 6.4 6.1 8.9  Hemoglobin 13.2 - 17.1 g/dL 13.8 14.5 13.9  Hematocrit 38.5 -  50.0 % 41.5 42.3 41.9  Platelets 140 - 400 Thousand/uL 81(L) 95(L) 103(L)    No results found for: VD25OH  Clinical ASCVD: No  The 10-year ASCVD risk score (Arnett DK, et al., 2019) is: 36.5%   Values used to calculate the score:     Age: 26 years     Sex: Male     Is Non-Hispanic African American: No     Diabetic: Yes     Tobacco smoker: Yes     Systolic Blood Pressure: 400 mmHg     Is BP treated: No     HDL Cholesterol: 43 mg/dL     Total Cholesterol: 160 mg/dL    Depression screen [facility] 2/9 06/02/2021 08/09/2017 06/28/2017  Decreased Interest 0 1 1  Down, Depressed, Hopeless 1 1 0  PHQ - 2 Score '1 2 1  ' Altered  sleeping - 2 1  Tired, decreased energy - 3 1  Change in appetite - 1 1  Feeling bad or failure about yourself  - 0 0  Trouble concentrating - 0 0  Moving slowly or fidgety/restless - 0 0  Suicidal thoughts - 0 0  PHQ-9 Score - 8 4  Difficult doing work/chores - Somewhat difficult Not difficult at all      Social History   Tobacco Use  Smoking Status Every Day   Packs/day: 2.00   Types: Cigarettes  Smokeless Tobacco Never  Tobacco Comments   Vaping more then cigs   BP Readings from Last 3 Encounters:  06/20/21 132/68  06/29/20 140/70  12/24/19 (!) 140/58   Pulse Readings from Last 3 Encounters:  06/20/21 72  06/29/20 76  12/24/19 74   Wt Readings from Last 3 Encounters:  06/20/21 255 lb (115.7 kg)  06/02/21 259 lb (117.5 kg)  06/29/20 259 lb (117.5 kg)    Assessment/Interventions: Review of patient past medical history, allergies, medications, health status, including review of consultants reports, laboratory and other test data, was performed as part of comprehensive evaluation and provision of chronic care management services.   SDOH:  (Social Determinants of Health) assessments and interventions performed: No   CCM Care Plan  Allergies  Allergen Reactions   Lipitor [Atorvastatin Calcium] Other (See Comments)    shaking   Sulfa Antibiotics Other (See Comments)    unknown   Zocor [Simvastatin - High Dose] Other (See Comments)    shaking    Medications Reviewed Today     Reviewed by Edythe Clarity, [facility] (Pharmacist) on 09/15/21 at [place] List Status: <None>   Medication Order Taking? Sig Documenting Provider Last Dose Status Informant  ezetimibe (ZETIA) 10 MG tablet [id number] Yes TAKE 1 TABLET EVERY DAY Pickard, Cammie Mcgee, MD Taking Active   fluocinonide ointment (LIDEX) 0.05 % [id number] Yes  [provider] Taking Active   hydrOXYzine (ATARAX/VISTARIL) 25 MG tablet [id number] Yes TAKE 1 TABLET (25 MG TOTAL) BY MOUTH 3 (THREE) TIMES DAILY AS  NEEDED. FOR Gery Pray, MD Taking Active   traMADol Veatrice Bourbon) 50 MG tablet [id number] Yes Take 1 tablet (50 mg total) by mouth every 6 (six) hours as needed. Susy Frizzle, MD Taking Active   Med List Note Belva Agee, CPhT 01/31/14 2306): Patient was taking zetia 10 mg daily - he can no longer afford this medicaiton            Patient Active Problem List   Diagnosis Date Noted   Thrombocytopenia ([place]) 12/09/2019   DDD (degenerative disc  disease), lumbar    HLD (hyperlipidemia) 12/09/2012   Diabetes mellitus without complication ([place])    Hypertension    Smoker     Immunization History  Administered Date(s) Administered   Fluad Quad(high Dose 65+) 06/13/2019, 06/29/2020, 06/20/2021   PNEUMOCOCCAL CONJUGATE-20 06/20/2021   Pneumococcal Polysaccharide-23 06/29/2020   Tdap 11/22/2011    Conditions to be addressed/monitored:  hypertension, diabetes, hyperlipidemia, tobacco abuse.  Care Plan : General Pharmacy (Adult)  Updates made by Edythe Clarity, RPH since 09/15/2021 12:00 AM     Problem: HTN, HLD, DM   Priority: High  Onset Date: 09/09/2020     Long-Range Goal: Patient-Specific Goal   Start Date: 09/09/2020  Expected End Date: 03/09/2021  Recent Progress: On track  Priority: High  Note:    Current Barriers:  None identified at this visit  Pharmacist Clinical Goal(s):  Over the next 180 days, patient will achieve adherence to monitoring guidelines and medication adherence to achieve therapeutic efficacy maintain control of blood sugar  as evidenced by A1c  Continue positive lifestyle changes through collaboration with PharmD and provider.   Interventions: 1:1 collaboration with Susy Frizzle, MD regarding development and update of comprehensive plan of care as evidenced by provider attestation and co-signature Inter-disciplinary care team collaboration (see longitudinal plan of care) Comprehensive medication review performed;  medication list updated in electronic medical record  Hypertension (BP goal <140/90) -controlled -Current treatment: None noted -Medications previously tried: none noted -Current home readings: none available, patient does not monitor -Current dietary habits: Following Dr. Arsenio Katz diet he found online eating more fresh vegetables and fish products.  Reports he has lost approx 20 pounds -Current exercise habits: Minimal, does some exercises from Dr. Arsenio Katz for people with disabilities -Denies hypotensive/hypertensive symptoms -Educated on BP goals and benefits of medications for prevention of heart attack, stroke and kidney damage; Importance of home blood pressure monitoring; Symptoms of hypotension and importance of maintaining adequate hydration; -Counseled to monitor BP at home periodically, document, and provide log at future appointments -Recommended to continue current medication Congratulated him on his positive lifestyle changes  Hyperlipidemia: (LDL goal < 100) -controlled -Current treatment: Ezetimibe 57m Appropriate, Effective, Safe, Accessible -Medications previously tried: statins (shaking)  -Current dietary patterns: see above -Current exercise habits: see above -Educated on Importance of limiting foods high in cholesterol; -Recommended to continue current medication  Update 03/10/21 Reports adherence with medication.  Last LDL was WNL.  Recommend repeat lipid panel in December 2022 for routine yearly labs.  Discussed the importance of this with patient. Continue current meds for now  Update 09/15/21 He continues Zetia 161mdaily.  LDL has increased some, he has tried many statins which cause him to shake. No longer in diabetic range so statin not always recommended. Improved lifestyle should continue to keep cholesterol controlled. Would continue routine screenings and adjust as necessary.  Diabetes (A1c goal <6.5%) -controlled -Current  medications: None -Medications previously tried: none noted  -Current home glucose readings fasting glucose: not monitoring post prandial glucose: not monitoring -Denies hypoglycemic/hyperglycemic symptoms -Current meal patterns:  Following Diet from Dr. LiArsenio Katzbout eating healthier.  Now eats more raw veggies, limits processed foods. -Current exercise: see above -Educated onA1c and blood sugar goals; Benefits of weight loss; Prevention and management of hypoglycemic episodes; Benefits of routine self-monitoring of blood sugar; -Counseled to check feet daily and get yearly eye exams -Counseled on being aware of hypoglycemia as he loses weight and continues healthy diet  Recommended continue current medications, notify  providers if you experience hypoglycemia symptoms  Update 03/10/21 Continues to take medication as directed.  Not currently monitoring sugar at home, but last A1c is excellent.  He is still focusing on diet and limiting carbs/sugars.  Limited exercise.  Denies any symptoms of hypoglycemia. No changes - continue current meds for now.  Update 09/15/21 He no longer takes metformin.  A1c remains well controlled at 5.1%.   He continues on improved lifestyle, however he does report that since food prices has increased he usually is only eating about one meal per day. He has resources for food banks, etc for which he uses. Continue routine screenings.  Patient is very well controlled, will follow up in one year.   Patient Goals/Self-Care Activities Over the next 120 days, patient will:  - take medications as prescribed target a minimum of 150 minutes of moderate intensity exercise weekly continue positive lifestyle changes  Follow Up Plan: The care management team will reach out to the patient again over the next 180 days.             Medication Assistance: None required.  Patient affirms current coverage meets needs.  Patient's preferred pharmacy  is:  [facility], [place] [address] Phone: [phone] Fax: [phone]  [id number] [place], [place] [address] [place] [place]: [phone]: [phone] [place] [address] [place] [address]Phone: [phone]: [phone]  Uses pill box? No  Pt endorses 100% compliance [facility]  We discussed: Current pharmacy is preferred with insurance plan and patient is satisfied with pharmacy services Patient decided to: Continue current medication management strategy  Care Plan and Follow Up Patient Decision:  Patient agrees to Care Plan and Follow-up.  Plan: The care management team will reach out to the patient again over the next 180 days.  CBeverly Milch PharmD Clinical Pharmacist [place]([phone]

## 2021-09-15 ENCOUNTER — Ambulatory Visit (INDEPENDENT_AMBULATORY_CARE_PROVIDER_SITE_OTHER): Payer: Medicare Other | Admitting: Pharmacist

## 2021-09-15 DIAGNOSIS — E785 Hyperlipidemia, unspecified: Secondary | ICD-10-CM

## 2021-09-15 DIAGNOSIS — E118 Type 2 diabetes mellitus with unspecified complications: Secondary | ICD-10-CM

## 2021-09-15 NOTE — Patient Instructions (Addendum)
Visit Information   Goals Addressed             This Visit's Progress    Lifestyle Change-Hypertension   On track    Timeframe:  Long-Range Goal Priority:  Medium Start Date:    09/09/20                         Expected End Date:    03/09/21                   Follow Up Date 12/21/20   - learn about high blood pressure    Why is this important?   The changes that you are asked to make may be hard to do.  This is especially true when the changes are life-long.  Knowing why it is important to you is the first step.  Working on the change with your family or support person helps you not feel alone.  Reward yourself and family or support person when goals are met. This can be an activity you choose like bowling, hiking, biking, swimming or shooting hoops.     Notes: Continue positive changes with diet and weight loss!!!!       Patient Care Plan: General Pharmacy (Adult)     Problem Identified: HTN, HLD, DM   Priority: High  Onset Date: 09/09/2020     Long-Range Goal: Patient-Specific Goal   Start Date: 09/09/2020  Expected End Date: 03/09/2021  Recent Progress: On track  Priority: High  Note:    Current Barriers:  None identified at this visit  Pharmacist Clinical Goal(s):  Over the next 180 days, patient will achieve adherence to monitoring guidelines and medication adherence to achieve therapeutic efficacy maintain control of blood sugar  as evidenced by A1c  Continue positive lifestyle changes through collaboration with PharmD and provider.   Interventions: 1:1 collaboration with Susy Frizzle, MD regarding development and update of comprehensive plan of care as evidenced by provider attestation and co-signature Inter-disciplinary care team collaboration (see longitudinal plan of care) Comprehensive medication review performed; medication list updated in electronic medical record  Hypertension (BP goal <140/90) -controlled -Current treatment: None  noted -Medications previously tried: none noted -Current home readings: none available, patient does not monitor -Current dietary habits: Following Dr. Arsenio Katz diet he found online eating more fresh vegetables and fish products.  Reports he has lost approx 20 pounds -Current exercise habits: Minimal, does some exercises from Dr. Arsenio Katz for people with disabilities -Denies hypotensive/hypertensive symptoms -Educated on BP goals and benefits of medications for prevention of heart attack, stroke and kidney damage; Importance of home blood pressure monitoring; Symptoms of hypotension and importance of maintaining adequate hydration; -Counseled to monitor BP at home periodically, document, and provide log at future appointments -Recommended to continue current medication Congratulated him on his positive lifestyle changes  Hyperlipidemia: (LDL goal < 100) -controlled -Current treatment: Ezetimibe 41m Appropriate, Effective, Safe, Accessible -Medications previously tried: statins (shaking)  -Current dietary patterns: see above -Current exercise habits: see above -Educated on Importance of limiting foods high in cholesterol; -Recommended to continue current medication  Update 03/10/21 Reports adherence with medication.  Last LDL was WNL.  Recommend repeat lipid panel in December 2022 for routine yearly labs.  Discussed the importance of this with patient. Continue current meds for now  Update 09/15/21 He continues Zetia 153mdaily.  LDL has increased some, he has tried many statins which cause him to shake. No longer in diabetic  range so statin not always recommended. Improved lifestyle should continue to keep cholesterol controlled. Would continue routine screenings and adjust as necessary.  Diabetes (A1c goal <6.5%) -controlled -Current medications: None -Medications previously tried: none noted  -Current home glucose readings fasting glucose: not monitoring post prandial  glucose: not monitoring -Denies hypoglycemic/hyperglycemic symptoms -Current meal patterns:  Following Diet from Dr. Arsenio Katz about eating healthier.  Now eats more raw veggies, limits processed foods. -Current exercise: see above -Educated onA1c and blood sugar goals; Benefits of weight loss; Prevention and management of hypoglycemic episodes; Benefits of routine self-monitoring of blood sugar; -Counseled to check feet daily and get yearly eye exams -Counseled on being aware of hypoglycemia as he loses weight and continues healthy diet  Recommended continue current medications, notify providers if you experience hypoglycemia symptoms  Update 03/10/21 Continues to take medication as directed.  Not currently monitoring sugar at home, but last A1c is excellent.  He is still focusing on diet and limiting carbs/sugars.  Limited exercise.  Denies any symptoms of hypoglycemia. No changes - continue current meds for now.  Update 09/15/21 He no longer takes metformin.  A1c remains well controlled at 5.1%.   He continues on improved lifestyle, however he does report that since food prices has increased he usually is only eating about one meal per day. He has resources for food banks, etc for which he uses. Continue routine screenings.  Patient is very well controlled, will follow up in one year.   Patient Goals/Self-Care Activities Over the next 120 days, patient will:  - take medications as prescribed target a minimum of 150 minutes of moderate intensity exercise weekly continue positive lifestyle changes  Follow Up Plan: The care management team will reach out to the patient again over the next 180 days.           Patient verbalizes understanding of instructions and care plan provided today and agrees to view in [place]. Active MyChart status confirmed with patient.   Telephone follow up appointment with pharmacy team member scheduled for: 1 year  Edythe Clarity, [place], PharmD, [place] Clinical Pharmacist Practitioner [place] [phone]

## 2021-09-20 DIAGNOSIS — E118 Type 2 diabetes mellitus with unspecified complications: Secondary | ICD-10-CM

## 2021-09-20 DIAGNOSIS — E785 Hyperlipidemia, unspecified: Secondary | ICD-10-CM | POA: Diagnosis not present

## 2022-01-06 ENCOUNTER — Ambulatory Visit (INDEPENDENT_AMBULATORY_CARE_PROVIDER_SITE_OTHER): Payer: Medicare Other | Admitting: Family Medicine

## 2022-01-06 VITALS — BP 132/64 | HR 60 | Temp 97.7°F | Ht 71.0 in | Wt 260.0 lb

## 2022-01-06 DIAGNOSIS — I10 Essential (primary) hypertension: Secondary | ICD-10-CM | POA: Diagnosis not present

## 2022-01-06 DIAGNOSIS — Z1211 Encounter for screening for malignant neoplasm of colon: Secondary | ICD-10-CM | POA: Diagnosis not present

## 2022-01-06 DIAGNOSIS — E118 Type 2 diabetes mellitus with unspecified complications: Secondary | ICD-10-CM | POA: Diagnosis not present

## 2022-01-06 DIAGNOSIS — R2232 Localized swelling, mass and lump, left upper limb: Secondary | ICD-10-CM

## 2022-01-06 DIAGNOSIS — E785 Hyperlipidemia, unspecified: Secondary | ICD-10-CM | POA: Diagnosis not present

## 2022-01-06 NOTE — Progress Notes (Signed)
Subjective:    Patient ID: Ricky Rios, male    DOB: 1951-03-02, 71 y.o.   MRN: [id number]  Diabetes    Echocardiogram last year revealed moderate aortic valve calcifications and mild aortic valve stenosis causing his murmur.  Patient is overdue for a colonoscopy.  Today he agrees to allow me to schedule him for a colonoscopy.  CT scan of the lungs last year revealed coronary atherosclerosis however he is asymptomatic.  Unfortunately he continues to smoke.  He also has a soft tissue tumor growing on the side of his left third digit.  Is roughly 1.5 cm.  It is not fluctuant.  It is firm.  I am not sure what this is.  Would recommend orthopedic consultation to discuss excision. Past Medical History:  Diagnosis Date   Allergy    Phreesia 03/01/2020   Arthritis    Phreesia 03/01/2020   Cataract    Phreesia 03/01/2020   Chest pain    Diagnostic cardiac cath October 2012. Normal coronary arteries, left ventricular function   COPD (chronic obstructive pulmonary disease) ([place])    Phreesia 03/01/2020   DDD (degenerative disc disease), lumbar    Diabetes mellitus    Diabetes mellitus without complication ([place])    Phreesia 03/01/2020   History of back surgery    History of neck surgery    HLD (hyperlipidemia) 12/09/2012   Hyperlipidemia    Phreesia 03/01/2020   Hypertension    Obesity    Right knee injury    Sleep apnea    Smoker    Past Surgical History:  Procedure Laterality Date   APPENDECTOMY     BACK SURGERY     for ruptured disc lumbar area   CARDIAC CATHETERIZATION  Oct 2012   nml coronary arteries, aorta, LV fcn   EYE SURGERY N/A    Phreesia 03/01/2020   I & D EXTREMITY Right 02/06/2013   Procedure: IRRIGATION AND DEBRIDEMENT INDEX FINGER;  Surgeon: Tennis Must, MD;  Location: [facility];  Service: Orthopedics;  Laterality: Right;   NECK SURGERY     for ruptured disc   SPINE SURGERY N/A    Phreesia 03/01/2020   TONSILLECTOMY     Current Outpatient Medications on  File Prior to Visit  Medication Sig Dispense Refill   ezetimibe (ZETIA) 10 MG tablet TAKE 1 TABLET EVERY DAY 90 tablet 3   fluocinonide ointment (LIDEX) 0.05 %      hydrOXYzine (ATARAX/VISTARIL) 25 MG tablet TAKE 1 TABLET (25 MG TOTAL) BY MOUTH 3 (THREE) TIMES DAILY AS NEEDED. FOR ITCHING 180 tablet 0   traMADol (ULTRAM) 50 MG tablet Take 1 tablet (50 mg total) by mouth every 6 (six) hours as needed. 30 tablet 0   No current facility-administered medications on file prior to visit.   Allergies  Allergen Reactions   Lipitor [Atorvastatin Calcium] Other (See Comments)    shaking   Sulfa Antibiotics Other (See Comments)    unknown   Zocor [Simvastatin - High Dose] Other (See Comments)    shaking   Social History   Socioeconomic History   Marital status: Married    Spouse name: Apolonio Schneiders   Number of children: 2   Years of education: Not on file   Highest education level: Not on file  Occupational History   Occupation: Disabled    Employer: Psychologist, occupational  Tobacco Use   Smoking status: Every Day    Packs/day: 2.00    Types: Cigarettes   Smokeless tobacco:  Never   Tobacco comments:    Vaping more then cigs  Substance and Sexual Activity   Alcohol use: No   Drug use: No   Sexual activity: Not on file  Other Topics Concern   Not on file  Social History Narrative   1 step-son. 1 daughter.    4 grandchildren.   Social Determinants of Health   Financial Resource Strain: Medium Risk (06/02/2021)   Overall Financial Resource Strain (CARDIA)    Difficulty of Paying Living Expenses: Somewhat hard  Food Insecurity: Food Insecurity Present (06/02/2021)   Hunger Vital Sign    Worried About Running Out of Food in the Last Year: Sometimes true    Ran Out of Food in the Last Year: Sometimes true  Transportation Needs: No Transportation Needs (06/02/2021)   PRAPARE - Hydrologist (Medical): No    Lack of Transportation (Non-Medical): No  Physical  Activity: Insufficiently Active (06/02/2021)   Exercise Vital Sign    Days of Exercise per Week: 2 days    Minutes of Exercise per Session: 10 min  Stress: Stress Concern Present (06/02/2021)   [place]    Feeling of Stress : To some extent  Social Connections: Socially Integrated (06/02/2021)   Social Connection and Isolation Panel [NHANES]    Frequency of Communication with Friends and Family: More than three times a week    Frequency of Social Gatherings with Friends and Family: Once a week    Attends Religious Services: 1 to 4 times per year    Active Member of [employer] or Organizations: Yes    Attends Archivist Meetings: 1 to 4 times per year    Marital Status: Married  Human resources officer Violence: Not At Risk (06/02/2021)   Humiliation, Afraid, Rape, and Kick questionnaire    Fear of Current or Ex-Partner: No    Emotionally Abused: No    Physically Abused: No    Sexually Abused: No     Review of Systems  All other systems reviewed and are negative.      Objective:   Physical Exam Vitals reviewed.  Constitutional:      Appearance: Normal appearance. He is obese.  Cardiovascular:     Rate and Rhythm: Normal rate and regular rhythm.     Pulses: Normal pulses.     Heart sounds: Murmur heard.     No gallop.  Pulmonary:     Effort: Pulmonary effort is normal. No respiratory distress.     Breath sounds: Normal breath sounds. No stridor. No wheezing, rhonchi or rales.  Chest:     Chest wall: No tenderness.  Musculoskeletal:     Right lower leg: No edema.     Left lower leg: No edema.  Neurological:     Mental Status: He is alert.           Assessment & Plan:  Controlled type 2 diabetes mellitus with complication, without long-term current use of insulin (HCC) - Plan: Hemoglobin A1c, CBC with Differential/Platelet, Lipid panel, Microalbumin, urine, COMPLETE METABOLIC PANEL WITH  GFR  Hyperlipidemia, unspecified hyperlipidemia type  Essential hypertension  Colon cancer screening - Plan: Ambulatory referral to Gastroenterology  Mass of left finger - Plan: Ambulatory referral to Hand Surgery Patient's murmur is unchanged.  Plan to repeat echocardiogram next year.  Continue to encourage exercise and weight loss and smoking cessation.  Blood pressure today is excellent.  Check lipid panel.  Goal  LDL cholesterol is less than 100.  Check A1c.  Goal A1c is less than 6.5.  Last year his A1c was outstanding.  Continue Zetia particularly given coronary atherosclerosis seen on chest CT.  Consult GI for colonoscopy.  Consult hand specialist to discuss excising the mass from his left finger.  Immunizations are up-to-date.

## 2022-01-07 LAB — CBC WITH DIFFERENTIAL/PLATELET
Absolute Monocytes: 400 cells/uL (ref 200–950)
Basophils Absolute: 70 cells/uL (ref 0–200)
Basophils Relative: 1.4 %
Eosinophils Absolute: 530 cells/uL — ABNORMAL HIGH (ref 15–500)
Eosinophils Relative: 10.6 %
HCT: 41.2 % (ref 38.5–50.0)
Hemoglobin: 13.9 g/dL (ref 13.2–17.1)
Lymphs Abs: 1605 cells/uL (ref 850–3900)
MCH: 30.7 pg (ref 27.0–33.0)
MCHC: 33.7 g/dL (ref 32.0–36.0)
MCV: 90.9 fL (ref 80.0–100.0)
MPV: 10.9 fL (ref 7.5–12.5)
Monocytes Relative: 8 %
Neutro Abs: 2395 cells/uL (ref 1500–7800)
Neutrophils Relative %: 47.9 %
Platelets: 85 10*3/uL — ABNORMAL LOW (ref 140–400)
RBC: 4.53 10*6/uL (ref 4.20–5.80)
RDW: 14.5 % (ref 11.0–15.0)
Total Lymphocyte: 32.1 %
WBC: 5 10*3/uL (ref 3.8–10.8)

## 2022-01-07 LAB — LIPID PANEL
Cholesterol: 161 mg/dL (ref <200)
HDL: 34 mg/dL — ABNORMAL LOW (ref >=40)
LDL Cholesterol (Calc): 99 mg/dL (calc)
Non-HDL Cholesterol (Calc): 127 mg/dL (calc) (ref <130)
Total CHOL/HDL Ratio: 4.7 (calc) (ref <5.0)
Triglycerides: 190 mg/dL — ABNORMAL HIGH (ref <150)

## 2022-01-07 LAB — HEMOGLOBIN A1C
Hgb A1c MFr Bld: 5.2 % of total Hgb (ref <5.7)
Mean Plasma Glucose: 103 mg/dL
eAG (mmol/L): 5.7 mmol/L

## 2022-01-07 LAB — COMPLETE METABOLIC PANEL WITH GFR
AG Ratio: 1.5 (calc) (ref 1.0–2.5)
ALT: 13 U/L (ref 9–46)
AST: 25 U/L (ref 10–35)
Albumin: 4 g/dL (ref 3.6–5.1)
Alkaline phosphatase (APISO): 79 U/L (ref 35–144)
BUN: 12 mg/dL (ref 7–25)
CO2: 24 mmol/L (ref 20–32)
Calcium: 9.5 mg/dL (ref 8.6–10.3)
Chloride: 108 mmol/L (ref 98–110)
Creat: 0.95 mg/dL (ref 0.70–1.35)
Globulin: 2.7 g/dL (calc) (ref 1.9–3.7)
Glucose, Bld: 101 mg/dL — ABNORMAL HIGH (ref 65–99)
Potassium: 4.2 mmol/L (ref 3.5–5.3)
Sodium: 140 mmol/L (ref 135–146)
Total Bilirubin: 1 mg/dL (ref 0.2–1.2)
Total Protein: 6.7 g/dL (ref 6.1–8.1)
eGFR: 87 mL/min/{1.73_m2} (ref >=60)

## 2022-01-07 LAB — MICROALBUMIN, URINE: Microalb, Ur: 20.1 mg/dL

## 2022-01-17 ENCOUNTER — Ambulatory Visit (INDEPENDENT_AMBULATORY_CARE_PROVIDER_SITE_OTHER): Payer: Medicare Other | Admitting: Orthopedic Surgery

## 2022-01-17 ENCOUNTER — Encounter: Payer: Self-pay | Admitting: Orthopedic Surgery

## 2022-01-17 DIAGNOSIS — R2232 Localized swelling, mass and lump, left upper limb: Secondary | ICD-10-CM | POA: Diagnosis not present

## 2022-02-16 ENCOUNTER — Other Ambulatory Visit: Payer: Self-pay | Admitting: Family Medicine

## 2022-02-16 ENCOUNTER — Telehealth: Payer: Self-pay

## 2022-02-16 MED ORDER — FUROSEMIDE 40 MG PO TABS: 40.0000 mg | ORAL_TABLET | Freq: Every day | ORAL | 3 refills | Status: DC

## 2022-02-16 NOTE — Telephone Encounter (Signed)
Pt called in requesting a refill of furosemide (LASIX) 40 MG tablet [[id number]]  DISCONTINUED   Order Details Dose: 40 mg Route: Oral Frequency: Daily  Dispense Quantity: 30 tablet Refills: 3        Pt states that he has lost his last refill of this med, and would like this refill to go to [facility] in [place].  Cb#: [phone]

## 2022-03-17 ENCOUNTER — Ambulatory Visit (INDEPENDENT_AMBULATORY_CARE_PROVIDER_SITE_OTHER): Payer: Medicare HMO | Admitting: Orthopedic Surgery

## 2022-03-17 ENCOUNTER — Encounter: Payer: Self-pay | Admitting: Orthopedic Surgery

## 2022-03-17 DIAGNOSIS — R2232 Localized swelling, mass and lump, left upper limb: Secondary | ICD-10-CM

## 2022-03-17 NOTE — Progress Notes (Signed)
Office Visit Note   Patient: Ricky Rios           Date of Birth: 1951/04/12           MRN: [id number] Visit Date: 03/17/2022              Requested by: Susy Frizzle, MD [address] [place],  [place] [address] PCP: Susy Frizzle, MD   Assessment & Plan: Visit Diagnoses:  1. Finger mass, left     Plan: Patient was found to have an area over the mass and look like it was going to drain.  A small needle was used to the remove the dead skin in the area.  There was expressible chalky like material as would be seen with an epidermal inclusion cyst discussed with patient that that is what this mass may be.  Advised to get ultrasound to further evaluate the lesion.  I can see him back once that is completed.  Follow-Up Instructions: No follow-ups on file.   Orders:  No orders of the defined types were placed in this encounter.  No orders of the defined types were placed in this encounter.     Procedures: No procedures performed   Clinical Data: No additional findings.   Subjective: Chief Complaint  Patient presents with   Left Middle Finger - Follow-up    This is a 71 year old right-hand-dominant male presents with a mass at the radial border of the left ring finger.  His last seen in the office at the end of June.  At that time he went to continue to observe the lesion as it was asymptomatic.  The lesion remains asymptomatic, however, he has noticed an area of dead skin directly over the distal aspect of the lesion that was somewhat worrisome for him.  The area is only painful if large amount of pressure is applied to the area.  He denies any numbness or paresthesias in the finger.  He denies any systemic symptoms.  He denies any drainage from the area.    Review of Systems   Objective: Vital Signs: There were no vitals taken for this visit.  Physical Exam  Left Hand Exam   Tenderness  The patient is experiencing no tenderness.   Other   Erythema: absent Sensation: normal Pulse: present  Comments:  Approximately 1 x 1 cm mass at the radial border of the middle finger at the level of the middle phalanx.  Mass is firm, round, minimally mobile.  There is an area of dead skin overlying the lesion which was unroofed.  There was expressible material which appeared to be chalky white appearance.      Specialty Comments:  No specialty comments available.  Imaging: No results found.   PMFS History: Patient Active Problem List   Diagnosis Date Noted   Finger mass, left 01/17/2022   Thrombocytopenia ([place]) 12/09/2019   DDD (degenerative disc disease), lumbar    HLD (hyperlipidemia) 12/09/2012   Diabetes mellitus without complication ([place])    Hypertension    Smoker    Past Medical History:  Diagnosis Date   Allergy    Phreesia 03/01/2020   Arthritis    Phreesia 03/01/2020   Cataract    Phreesia 03/01/2020   Chest pain    Diagnostic cardiac cath October 2012. Normal coronary arteries, left ventricular function   COPD (chronic obstructive pulmonary disease) ([place])    Phreesia 03/01/2020   DDD (degenerative disc disease), lumbar  Diabetes mellitus    Diabetes mellitus without complication ([place])    Phreesia 03/01/2020   History of back surgery    History of neck surgery    HLD (hyperlipidemia) 12/09/2012   Hyperlipidemia    Phreesia 03/01/2020   Hypertension    Obesity    Right knee injury    Sleep apnea    Smoker     Family History  Problem Relation Age of Onset   Dementia Mother    Heart disease Father     Past Surgical History:  Procedure Laterality Date   APPENDECTOMY     BACK SURGERY     for ruptured disc lumbar area   CARDIAC CATHETERIZATION  Oct 2012   nml coronary arteries, aorta, LV fcn   EYE SURGERY N/A    Phreesia 03/01/2020   I & D EXTREMITY Right 02/06/2013   Procedure: IRRIGATION AND DEBRIDEMENT INDEX FINGER;  Surgeon: Tennis Must, MD;  Location: [facility];  Service: Orthopedics;   Laterality: Right;   NECK SURGERY     for ruptured disc   SPINE SURGERY N/A    Phreesia 03/01/2020   TONSILLECTOMY     Social History   Occupational History   Occupation: Disabled    Employer: Psychologist, occupational  Tobacco Use   Smoking status: Every Day    Packs/day: 2.00    Types: Cigarettes   Smokeless tobacco: Never   Tobacco comments:    Vaping more then cigs  Substance and Sexual Activity   Alcohol use: No   Drug use: No   Sexual activity: Not on file

## 2022-04-18 DIAGNOSIS — Z961 Presence of intraocular lens: Secondary | ICD-10-CM | POA: Diagnosis not present

## 2022-04-18 DIAGNOSIS — H00023 Hordeolum internum right eye, unspecified eyelid: Secondary | ICD-10-CM | POA: Diagnosis not present

## 2022-05-10 ENCOUNTER — Telehealth: Payer: Self-pay | Admitting: Family Medicine

## 2022-05-10 NOTE — Telephone Encounter (Signed)
Left message on voicemail to request call back; need to reschedule Medicare AWV appt with Amber.

## 2022-05-31 NOTE — Telephone Encounter (Signed)
Left message to return call; need to reschedule Medicare AWV appt currently scheduled for Thurs, 06/08/22.

## 2022-06-07 ENCOUNTER — Telehealth: Payer: Self-pay | Admitting: Family Medicine

## 2022-06-07 NOTE — Telephone Encounter (Signed)
Left message for patient to call back and re-schedule Medicare Annual Wellness Visit (AWV).   If not able to come in office, please offer to do virtually or by telephone.   Last AWV: 08/02/2021  Please schedule at anytime with [facility]  If any questions, please contact me at [phone].  Thank you ,  Colletta [place]

## 2022-07-03 ENCOUNTER — Telehealth: Payer: Self-pay | Admitting: Family Medicine

## 2022-07-03 NOTE — Telephone Encounter (Signed)
Left message for patient to call back and schedule Medicare Annual Wellness Visit (AWV) in office.   If not able to come in office, please offer to do virtually or by telephone.   Last AWV: 06/02/2021   Please schedule at anytime with [facility] Courtney  If any questions, please contact me at [phone].  Thank you,  Stephanie 

## 2022-07-27 ENCOUNTER — Ambulatory Visit (INDEPENDENT_AMBULATORY_CARE_PROVIDER_SITE_OTHER): Payer: Medicare HMO

## 2022-07-27 VITALS — Ht 71.0 in | Wt 260.0 lb

## 2022-07-27 DIAGNOSIS — Z0000 Encounter for general adult medical examination without abnormal findings: Secondary | ICD-10-CM

## 2022-07-27 NOTE — Patient Instructions (Addendum)
Mr. Ricky Rios , Thank you for taking time to come for your Medicare Wellness Visit. I appreciate your ongoing commitment to your health goals. Please review the following plan we discussed and let me know if I can assist you in the future.   These are the goals we discussed:  Goals      Exercise 3x per week (30 min per time)     Lifestyle Change-Hypertension     Timeframe:  Long-Range Goal Priority:  Medium Start Date:    09/09/20                         Expected End Date:    03/09/21                   Follow Up Date 12/21/20   - learn about high blood pressure    Why is this important?   The changes that you are asked to make may be hard to do.  This is especially true when the changes are life-long.  Knowing why it is important to you is the first step.  Working on the change with your family or support person helps you not feel alone.  Reward yourself and family or support person when goals are met. This can be an activity you choose like bowling, hiking, biking, swimming or shooting hoops.     Notes: Continue positive changes with diet and weight loss!!!!     Pharmacy Care Plan:     CARE PLAN ENTRY (see longitudinal plan of care for additional care plan information)  Current Barriers:  Chronic Disease Management support, education, and care coordination needs related to Hypertension, Hyperlipidemia, and Diabetes   Hypertension BP Readings from Last 3 Encounters:  12/24/19 (!) 140/58  12/10/19 138/61  10/30/19 140/70  Pharmacist Clinical Goal(s): Over the next 180 days, patient will work with PharmD and providers to maintain BP goal <140/90 Current regimen:  No medication Interventions: Discussed dietary recommendations Recommended chair exercises Patient self care activities - Over the next 180 days, patient will: Check BP as needed, document, and provide at future appointments Ensure daily salt intake < 2300 mg/day  Hyperlipidemia Lab Results  Component Value  Date/Time   LDLCALC 86 12/02/2019 12:02 PM  Pharmacist Clinical Goal(s): Over the next 180 days, patient will work with PharmD and providers to achieve LDL goal < 70, TG < 150 Current regimen:  Ezetimibe 70m daily Interventions: Discussed dietary modifications  Counseled on chair exercises Patient self care activities - Over the next 180 days, patient will: Focus on limiting high fat and sugar containing foods and beverages from diet Focus on medication adherence by pill count  Diabetes Lab Results  Component Value Date/Time   HGBA1C 6.5 (H) 12/02/2019 12:02 PM   HGBA1C 5.4 11/29/2018 12:05 PM   HGBA1C 11.8 (H) 10/19/2014 11:03 AM  Pharmacist Clinical Goal(s): Over the next 180 days, patient will work with PharmD and providers to maintain A1c goal <7% Current regimen:  Metformin 1000 mg twice daily Interventions: Discussed home blood sugar testing Counseled on diabetic diet Reviewed signs and symptoms of hypoglycemia Patient self care activities - Over the next 180 days, patient will: Check blood sugar as needed, document, and provide at future appointments Contact provider with any episodes of hypoglycemia Focus on cutting back on regular sodas and limiting servings of carbohydrates to one per meal  Initial goal documentation      Prevent falls  This is a list of the screening recommended for you and due dates:  Health Maintenance  Topic Date Due   Complete foot exam   Never done   Yearly kidney health urinalysis for diabetes  Never done   Colon Cancer Screening  Never done   Eye exam for diabetics  01/06/2020   DTaP/Tdap/Td vaccine (2 - Td or Tdap) 11/21/2021   Hemoglobin A1C  07/08/2022   COVID-19 Vaccine (1) 08/12/2022*   Flu Shot  10/22/2022*   Zoster (Shingles) Vaccine (1 of 2) 10/26/2022*   Yearly kidney function blood test for diabetes  01/07/2023   Medicare Annual Wellness Visit  07/28/2023   Pneumonia Vaccine  Completed   Hepatitis C Screening:  USPSTF Recommendation to screen - Ages 18-79 yo.  Completed   HPV Vaccine  Aged Out  *Topic was postponed. The date shown is not the original due date.    Advanced directives: Advance directive discussed with you today. I have provided a copy for you to complete at home and have notarized. Once this is complete please bring a copy in to our office so we can scan it into your chart.   Conditions/risks identified: Aim for 30 minutes of exercise or brisk walking, 6-8 glasses of water, and 5 servings of fruits and vegetables each day.   Next appointment: Follow up in one year for your annual wellness visit.   Preventive Care 31 Years and Older, Male  Preventive care refers to lifestyle choices and visits with your health care provider that can promote health and wellness. What does preventive care include? A yearly physical exam. This is also called an annual well check. Dental exams once or twice a year. Routine eye exams. Ask your health care provider how often you should have your eyes checked. Personal lifestyle choices, including: Daily care of your teeth and gums. Regular physical activity. Eating a healthy diet. Avoiding tobacco and drug use. Limiting alcohol use. Practicing safe sex. Taking low doses of aspirin every day. Taking vitamin and mineral supplements as recommended by your health care provider. What happens during an annual well check? The services and screenings done by your health care provider during your annual well check will depend on your age, overall health, lifestyle risk factors, and family history of disease. Counseling  Your health care provider may ask you questions about your: Alcohol use. Tobacco use. Drug use. Emotional well-being. Home and relationship well-being. Sexual activity. Eating habits. History of falls. Memory and ability to understand (cognition). Work and work Statistician. Screening  You may have the following tests or  measurements: Height, weight, and BMI. Blood pressure. Lipid and cholesterol levels. These may be checked every 5 years, or more frequently if you are over 1 years old. Skin check. Lung cancer screening. You may have this screening every year starting at age 31 if you have a 30-pack-year history of smoking and currently smoke or have quit within the past 15 years. Fecal occult blood test (FOBT) of the stool. You may have this test every year starting at age 44. Flexible sigmoidoscopy or colonoscopy. You may have a sigmoidoscopy every 5 years or a colonoscopy every 10 years starting at age 61. Prostate cancer screening. Recommendations will vary depending on your family history and other risks. Hepatitis C blood test. Hepatitis B blood test. Sexually transmitted disease (STD) testing. Diabetes screening. This is done by checking your blood sugar (glucose) after you have not eaten for a while (fasting). You may have this done  every 1-3 years. Abdominal aortic aneurysm (AAA) screening. You may need this if you are a current or former smoker. Osteoporosis. You may be screened starting at age 36 if you are at high risk. Talk with your health care provider about your test results, treatment options, and if necessary, the need for more tests. Vaccines  Your health care provider may recommend certain vaccines, such as: Influenza vaccine. This is recommended every year. Tetanus, diphtheria, and acellular pertussis (Tdap, Td) vaccine. You may need a Td booster every 10 years. Zoster vaccine. You may need this after age 54. Pneumococcal 13-valent conjugate (PCV13) vaccine. One dose is recommended after age 77. Pneumococcal polysaccharide (PPSV23) vaccine. One dose is recommended after age 57. Talk to your health care provider about which screenings and vaccines you need and how often you need them. This information is not intended to replace advice given to you by your health care provider. Make sure  you discuss any questions you have with your health care provider. Document Released: 08/06/2015 Document Revised: 03/29/2016 Document Reviewed: 05/11/2015 Elsevier Interactive Patient Education  2017 [employer] Prevention in the Home Falls can cause injuries. They can happen to people of all ages. There are many things you can do to make your home safe and to help prevent falls. What can I do on the outside of my home? Regularly fix the edges of walkways and driveways and fix any cracks. Remove anything that might make you trip as you walk through a door, such as a raised step or threshold. Trim any bushes or trees on the path to your home. Use bright outdoor lighting. Clear any walking paths of anything that might make someone trip, such as rocks or tools. Regularly check to see if handrails are loose or broken. Make sure that both sides of any steps have handrails. Any raised decks and porches should have guardrails on the edges. Have any leaves, snow, or ice cleared regularly. Use sand or salt on walking paths during winter. Clean up any spills in your garage right away. This includes oil or grease spills. What can I do in the bathroom? Use night lights. Install grab bars by the toilet and in the tub and shower. Do not use towel bars as grab bars. Use non-skid mats or decals in the tub or shower. If you need to sit down in the shower, use a plastic, non-slip stool. Keep the floor dry. Clean up any water that spills on the floor as soon as it happens. Remove soap buildup in the tub or shower regularly. Attach bath mats securely with double-sided non-slip rug tape. Do not have throw rugs and other things on the floor that can make you trip. What can I do in the bedroom? Use night lights. Make sure that you have a light by your bed that is easy to reach. Do not use any sheets or blankets that are too big for your bed. They should not hang down onto the floor. Have a firm  chair that has side arms. You can use this for support while you get dressed. Do not have throw rugs and other things on the floor that can make you trip. What can I do in the kitchen? Clean up any spills right away. Avoid walking on wet floors. Keep items that you use a lot in easy-to-reach places. If you need to reach something above you, use a strong step stool that has a grab bar. Keep electrical cords out of the  way. Do not use floor polish or wax that makes floors slippery. If you must use wax, use non-skid floor wax. Do not have throw rugs and other things on the floor that can make you trip. What can I do with my stairs? Do not leave any items on the stairs. Make sure that there are handrails on both sides of the stairs and use them. Fix handrails that are broken or loose. Make sure that handrails are as long as the stairways. Check any carpeting to make sure that it is firmly attached to the stairs. Fix any carpet that is loose or worn. Avoid having throw rugs at the top or bottom of the stairs. If you do have throw rugs, attach them to the floor with carpet tape. Make sure that you have a light switch at the top of the stairs and the bottom of the stairs. If you do not have them, ask someone to add them for you. What else can I do to help prevent falls? Wear shoes that: Do not have high heels. Have rubber bottoms. Are comfortable and fit you well. Are closed at the toe. Do not wear sandals. If you use a stepladder: Make sure that it is fully opened. Do not climb a closed stepladder. Make sure that both sides of the stepladder are locked into place. Ask someone to hold it for you, if possible. Clearly mark and make sure that you can see: Any grab bars or handrails. First and last steps. Where the edge of each step is. Use tools that help you move around (mobility aids) if they are needed. These include: Canes. Walkers. Scooters. Crutches. Turn on the lights when you go  into a dark area. Replace any light bulbs as soon as they burn out. Set up your furniture so you have a clear path. Avoid moving your furniture around. If any of your floors are uneven, fix them. If there are any pets around you, be aware of where they are. Review your medicines with your doctor. Some medicines can make you feel dizzy. This can increase your chance of falling. Ask your doctor what other things that you can do to help prevent falls. This information is not intended to replace advice given to you by your health care provider. Make sure you discuss any questions you have with your health care provider. Document Released: 05/06/2009 Document Revised: 12/16/2015 Document Reviewed: 08/14/2014 Elsevier Interactive Patient Education  2017 [employer].

## 2022-07-27 NOTE — Progress Notes (Signed)
Subjective:   Ricky Rios is a 72 y.o. male who presents for Medicare Annual/Subsequent preventive examination.  I connected with  Ricky Rios on 07/27/22 by a audio enabled telemedicine application and verified that I am speaking with the correct person using two identifiers.  Patient Location: Home  Provider Location: Office/Clinic  I discussed the limitations of evaluation and management by telemedicine. The patient expressed understanding and agreed to proceed.  Review of Systems     Cardiac Risk Factors include: advanced age (>57mn, >>91women);male gender;hypertension;sedentary lifestyle     Objective:    Today's Vitals   07/27/22 1233  Weight: 260 lb (117.9 kg)  Height: _0  (1.803 m)   Body mass index is 36.26 kg/m.     07/27/2022   12:08 PM 06/02/2021   12:06 PM 04/01/2016    1:57 PM  Advanced Directives  Does Patient Have a Medical Advance Directive? No No No  Would patient like information on creating a medical advance directive? Yes (MAU/Ambulatory/Procedural Areas - Information given) No - Patient declined     Current Medications (verified) Outpatient Encounter Medications as of 07/27/2022  Medication Sig   ezetimibe (ZETIA) 10 MG tablet TAKE 1 TABLET EVERY DAY   fluocinonide ointment (LIDEX) 0.05 %    furosemide (LASIX) 40 MG tablet Take 1 tablet (40 mg total) by mouth daily.   hydrOXYzine (ATARAX/VISTARIL) 25 MG tablet TAKE 1 TABLET (25 MG TOTAL) BY MOUTH 3 (THREE) TIMES DAILY AS NEEDED. FOR ITCHING   [DISCONTINUED] traMADol (ULTRAM) 50 MG tablet Take 1 tablet (50 mg total) by mouth every 6 (six) hours as needed.   No facility-administered encounter medications on file as of 07/27/2022.    Allergies (verified) Lipitor [atorvastatin calcium], Sulfa antibiotics, and Zocor [simvastatin - high dose]   History: Past Medical History:  Diagnosis Date   Allergy    Phreesia 03/01/2020   Arthritis    Phreesia 03/01/2020   Cataract     Phreesia 03/01/2020   Chest pain    Diagnostic cardiac cath October 2012. Normal coronary arteries, left ventricular function   COPD [place] [facility]    [facility] 03/01/2020   DDD (degenerative disc disease), lumbar    Diabetes mellitus    Diabetes mellitus without complication ([facility]    [facility] 03/01/2020   History of back surgery    History of neck surgery    HLD (hyperlipidemia) 12/09/2012   Hyperlipidemia    [facility]reesia 03/01/2020   Hypertension    Obesity    Right knee injury    Sleep apnea    Smoker    Past Surgical History:  Procedure Laterality Date   APPENDECTOMY     BACK SURGERY     for ruptured disc lumbar area   CARDIAC CATHETERIZATION  Oct 2012   nml coronary arteries, aorta, LV fcn   EYE SURGERY N/A    Phreesia 03/01/2020   I & D EXTREMITY Right 02/06/2013   Procedure: IRRIGATION AND DEBRIDEMENT INDEX FINGER;  Surgeon: [name] Must MD;  [facility]  Service: Orthopedics;  Laterality: Right;   NECK SURGERY     for ruptured disc   SPINE SURGERY N/A    Phreesia 03/01/2020   TONSILLECTOMY     Family History  Problem Relation Age of Onset   Dementia Mother    Heart disease Father    Social History   Socioeconomic History   Marital status: Married    Spouse name: RApolonio Schneiders  Number  of children: 2   Years of education: Not on file   Highest education level: Not on file  Occupational History   Occupation: Disabled    Employer: [employer], occupational  Tobacco Use   Smoking status: Every Day    Packs/day: 2.00    Types: Cigarettes   Smokeless tobacco: Never   Tobacco comments:    Vaping more then cigs  Substance and Sexual Activity   Alcohol use: No   Drug use: No   Sexual activity: Not on file  Other Topics Concern   Not on file  Social History Narrative   1 step-son. 1 daughter.    4 grandchildren.   Social Determinants of Health   Financial Resource Strain: Medium Risk (06/02/2021)   Overall Financial Resource  Strain (CARDIA)    Difficulty of Paying Living Expenses: Somewhat hard  Food Insecurity: Food Insecurity Present (06/02/2021)   Hunger Vital Sign    Worried About Running Out of Food in the Last Year: Sometimes true    Ran Out of Food in the Last Year: Sometimes true  Transportation Needs: No Transportation Needs (06/02/2021)   PRAPARE - Hydrologist (Medical): No    Lack of Transportation (Non-Medical): No  Physical Activity: Insufficiently Active (06/02/2021)   Exercise Vital Sign    Days of Exercise per Week: 2 days    Minutes of Exercise per Session: 10 min  Stress: Stress Concern Present (06/02/2021)   Nathalie    Feeling of Stress : To some extent  Social Connections: Socially Integrated (06/02/2021)   Social Connection and Isolation Panel [NHANES]    Frequency of Communication with Friends and Family: More than three times a week    Frequency of Social Gatherings with Friends and Family: Once a week    Attends Religious Services: 1 to 4 times per year    Active Member of [employer] or Organizations: Yes    Attends Archivist Meetings: 1 to 4 times per year    Marital Status: Married    Tobacco Counseling Ready to quit: Not Answered Counseling given: Not Answered Tobacco comments: Vaping more then cigs   Clinical Intake:  Pre-visit preparation completed: Yes  Pain : No/denies pain  Diabetes: Yes CBG done?: No Did pt. bring in CBG monitor from home?: No  How often do you need to have someone help you when you read instructions, pamphlets, or other written materials from your doctor or pharmacy?: 1 - Never  Diabetic?Yes Nutrition Risk Assessment:  Has the patient had any N/V/D within the last 2 months?  No  Does the patient have any non-healing wounds?  No  Has the patient had any unintentional weight loss or weight gain?  No   Diabetes:  Is the patient  diabetic?  Yes  If diabetic, was a CBG obtained today?  No  Did the patient bring in their glucometer from home?  No  How often do you monitor your CBG's? As needed .   Financial Strains and Diabetes Management:  Are you having any financial strains with the device, your supplies or your medication? No .  Does the patient want to be seen by Chronic Care Management for management of their diabetes?  No  Would the patient like to be referred to a Nutritionist or for Diabetic Management?  No   Diabetic Exams:  Diabetic Eye Exam: Completed with Dr. Pamelia Hoit; will request records  Diabetic Foot  Exam: Completed at next office visit    Interpreter Needed?: No  Information entered by :: Denman George LPN   Activities of Daily Living    07/27/2022   12:09 PM  In your present state of health, do you have any difficulty performing the following activities:  Hearing? 0  Vision? 0  Difficulty concentrating or making decisions? 0  Walking or climbing stairs? 1  Dressing or bathing? 0  Doing errands, shopping? 0  Preparing Food and eating ? N  Using the Toilet? N  In the past six months, have you accidently leaked urine? N  Do you have problems with loss of bowel control? N  Managing your Medications? N  Managing your Finances? N  Housekeeping or managing your Housekeeping? Y  Comment due to back pain    Patient Care Team: Susy Frizzle, MD as PCP - General (Family Medicine) Edythe Clarity, [facility] as Pharmacist (Pharmacist)  Indicate any recent Medical Services you may have received from other than [facility] providers in the past year (date may be approximate).     Assessment:   This is a routine wellness examination for [place].  Hearing/Vision screen Hearing Screening - Comments:: Denies hearing difficulties  Vision Screening - Comments:: Wears rx glasses - up to date with routine eye exams with Dr. Pamelia Hoit   Dietary issues and exercise activities discussed: Current Exercise  Habits: The patient does not participate in regular exercise at present, Exercise limited by: orthopedic condition(s)   Goals Addressed             This Visit's Progress    Prevent falls         Depression Screen    01/06/2022    2:32 PM 06/02/2021   11:55 AM 08/09/2017   12:02 PM 06/28/2017    2:30 PM  PHQ 2/9 Scores  PHQ - 2 Score _0 PHQ- 9 Score _1 Fall Risk    07/27/2022   12:11 PM 01/06/2022    2:31 PM 06/02/2021   12:08 PM 06/18/2019   10:10 AM 02/21/2019    5:11 PM  Fall Risk   Falls in the past year? 1 0 1 0   Comment    Emmi Telephone Survey: data to providers prior to load Emmi Telephone Survey: data to providers prior to load  Number falls in past yr: 1 0 0    Comment     Emmi Telephone Survey Actual Response =   Injury with Fall? 0 0 0    Risk for fall due to :   Impaired balance/gait;Impaired mobility;History of fall(s)    Follow up   Falls prevention discussed      FALL RISK PREVENTION PERTAINING TO THE HOME:  Any stairs in or around the home? Yes  If so, are there any without handrails? No  Home free of loose throw rugs in walkways, pet beds, electrical cords, etc? Yes  Adequate lighting in your home to reduce risk of falls? Yes   ASSISTIVE DEVICES UTILIZED TO PREVENT FALLS:  Life alert? No  Use of a cane, walker or w/c? Yes  Grab bars in the bathroom? Yes  Shower chair or bench in shower? No  Elevated toilet seat or a handicapped toilet? Yes   TIMED UP AND GO:  Was the test performed? No . Telephonic visit   Cognitive Function:        07/27/2022   12:10 PM 06/02/2021  12:11 PM  6CIT Screen  What Year? 0 points 0 points  What month? 0 points 0 points  What time? 0 points 0 points  Count back from 20 0 points 0 points  Months in reverse 0 points 0 points  Repeat phrase 0 points 0 points  Total Score 0 points 0 points    Immunizations Immunization History  Administered Date(s) Administered   Fluad Quad(high Dose 65+)  06/13/2019, 06/29/2020, 06/20/2021   PNEUMOCOCCAL CONJUGATE-20 06/20/2021   Pneumococcal Polysaccharide-23 06/29/2020   Tdap 11/22/2011    TDAP status: Due, Education has been provided regarding the importance of this vaccine. Advised may receive this vaccine at local pharmacy or Health Dept. Aware to provide a copy of the vaccination record if obtained from local pharmacy or Health Dept. Verbalized acceptance and understanding.  Flu Vaccine status: Declined, Education has been provided regarding the importance of this vaccine but patient still declined. Advised may receive this vaccine at local pharmacy or Health Dept. Aware to provide a copy of the vaccination record if obtained from local pharmacy or Health Dept. Verbalized acceptance and understanding.  Pneumococcal vaccine status: Up to date  Covid-19 vaccine status: Information provided on how to obtain vaccines.   Qualifies for Shingles Vaccine? Yes   Zostavax completed No   Shingrix Completed?: No.    Education has been provided regarding the importance of this vaccine. Patient has been advised to call insurance company to determine out of pocket expense if they have not yet received this vaccine. Advised may also receive vaccine at local pharmacy or Health Dept. Verbalized acceptance and understanding.  Screening Tests Health Maintenance  Topic Date Due   FOOT EXAM  Never done   Diabetic kidney evaluation - Urine ACR  Never done   COLONOSCOPY (Pts 45-78yr Insurance coverage will need to be confirmed)  Never done   OPHTHALMOLOGY EXAM  01/06/2020   DTaP/Tdap/Td (2 - Td or Tdap) 11/21/2021   HEMOGLOBIN A1C  07/08/2022   COVID-19 Vaccine (1) 08/12/2022 (Originally 10/01/1951)   INFLUENZA VACCINE  10/22/2022 (Originally 02/21/2022)   Zoster Vaccines- Shingrix (1 of 2) 10/26/2022 (Originally 04/03/2002)   Diabetic kidney evaluation - eGFR measurement  01/07/2023   Medicare Annual Wellness (AWV)  07/28/2023   Pneumonia Vaccine 72  Years old  Completed   Hepatitis C Screening  Completed   HPV VACCINES  Aged Out    Health Maintenance  Health Maintenance Due  Topic Date Due   FOOT EXAM  Never done   Diabetic kidney evaluation - Urine ACR  Never done   COLONOSCOPY (Pts 45-418yrInsurance coverage will need to be confirmed)  Never done   OPHTHALMOLOGY EXAM  01/06/2020   DTaP/Tdap/Td (2 - Td or Tdap) 11/21/2021   HEMOGLOBIN A1C  07/08/2022    Colorectal cancer screening: Referral to GI placed after discussion with provider at upcoming appointment. Pt aware the office will call re: appt.  Lung Cancer Screening: (Low Dose CT Chest recommended if Age 71-80ears, 30 pack-year currently smoking OR have quit w/in 15years.) does qualify.   Lung Cancer Screening Referral: Last done 07/21/21; will consider repeat after discussion with provider   Additional Screening:  Hepatitis C Screening: does qualify; Completed 12/26/13  Vision Screening: Recommended annual ophthalmology exams for early detection of glaucoma and other disorders of the eye. Is the patient up to date with their annual eye exam?  Yes  Who is the provider or what is the name of the office in which the patient attends  annual eye exams? Dr. Pamelia Hoit  If pt is not established with a provider, would they like to be referred to a provider to establish care? No .   Dental Screening: Recommended annual dental exams for proper oral hygiene  Community Resource Referral / Chronic Care Management: CRR required this visit?  No   CCM required this visit?   Currently receiving services      Plan:     I have personally reviewed and noted the following in the patient's chart:   Medical and social history Use of alcohol, tobacco or illicit drugs  Current medications and supplements including opioid prescriptions. Patient is not currently taking opioid prescriptions. Functional ability and status Nutritional status Physical activity Advanced directives List of  other physicians Hospitalizations, surgeries, and ER visits in previous 12 months Vitals Screenings to include cognitive, depression, and falls Referrals and appointments  In addition, I have reviewed and discussed with patient certain preventive protocols, quality metrics, and best practice recommendations. A written personalized care plan for preventive services as well as general preventive health recommendations were provided to patient.     Vanetta Mulders, [place]   08/25/5670   Due to this being a virtual visit, the after visit summary with patients personalized plan was offered to patient via mail or my-chart.  per request, patient was mailed a copy of AVS.  Nurse Notes: Patient scheduled for yearly appointment in June 2024

## 2022-11-24 DIAGNOSIS — Z0000 Encounter for general adult medical examination without abnormal findings: Secondary | ICD-10-CM | POA: Diagnosis not present

## 2022-11-24 DIAGNOSIS — E785 Hyperlipidemia, unspecified: Secondary | ICD-10-CM | POA: Diagnosis not present

## 2022-11-24 DIAGNOSIS — Z125 Encounter for screening for malignant neoplasm of prostate: Secondary | ICD-10-CM | POA: Diagnosis not present

## 2022-11-24 DIAGNOSIS — J449 Chronic obstructive pulmonary disease, unspecified: Secondary | ICD-10-CM | POA: Diagnosis not present

## 2022-11-24 DIAGNOSIS — E119 Type 2 diabetes mellitus without complications: Secondary | ICD-10-CM | POA: Diagnosis not present

## 2022-11-24 DIAGNOSIS — M5136 Other intervertebral disc degeneration, lumbar region: Secondary | ICD-10-CM | POA: Diagnosis not present

## 2022-11-24 DIAGNOSIS — S80911A Unspecified superficial injury of right knee, initial encounter: Secondary | ICD-10-CM | POA: Diagnosis not present

## 2022-11-24 DIAGNOSIS — I10 Essential (primary) hypertension: Secondary | ICD-10-CM | POA: Diagnosis not present

## 2022-11-24 DIAGNOSIS — Z720 Tobacco use: Secondary | ICD-10-CM | POA: Diagnosis not present

## 2022-11-27 ENCOUNTER — Other Ambulatory Visit: Payer: Self-pay | Admitting: Family Medicine

## 2022-11-27 ENCOUNTER — Ambulatory Visit
Admission: RE | Admit: 2022-11-27 | Discharge: 2022-11-27 | Disposition: A | Discharge status: Home / Self Care | Payer: Medicare HMO | Source: Ambulatory Visit | Attending: Family Medicine | Admitting: Family Medicine

## 2022-11-27 DIAGNOSIS — S8991XA Unspecified injury of right lower leg, initial encounter: Secondary | ICD-10-CM

## 2022-11-29 ENCOUNTER — Encounter (INDEPENDENT_AMBULATORY_CARE_PROVIDER_SITE_OTHER): Payer: Self-pay | Admitting: *Deleted

## 2022-12-06 ENCOUNTER — Other Ambulatory Visit: Payer: Self-pay | Admitting: Emergency Medicine

## 2022-12-06 DIAGNOSIS — F17210 Nicotine dependence, cigarettes, uncomplicated: Secondary | ICD-10-CM

## 2022-12-06 DIAGNOSIS — Z87891 Personal history of nicotine dependence: Secondary | ICD-10-CM

## 2022-12-06 DIAGNOSIS — Z122 Encounter for screening for malignant neoplasm of respiratory organs: Secondary | ICD-10-CM

## 2022-12-15 ENCOUNTER — Telehealth (INDEPENDENT_AMBULATORY_CARE_PROVIDER_SITE_OTHER): Payer: Self-pay | Admitting: Gastroenterology

## 2022-12-15 NOTE — Telephone Encounter (Signed)
Any room Thanks 

## 2022-12-15 NOTE — Telephone Encounter (Signed)
Who is your primary care physician: Dr.Eli Hammer  Reasons for the colonoscopy: Screening  Have you had a colonoscopy before?  Yes; age 72  Do you have family history of colon cancer? no  Previous colonoscopy with polyps removed? yes  Do you have a history colorectal cancer?   no  Are you diabetic? If yes, Type 1 or Type 2?    no  Do you have a prosthetic or mechanical heart valve? no  Do you have a pacemaker/defibrillator?   no  Have you had endocarditis/atrial fibrillation? no  Have you had joint replacement within the last 12 months?  no  Do you tend to be constipated or have to use laxatives? yes  Do you have any history of drugs or alchohol?  no  Do you use supplemental oxygen?  no  Have you had a stroke or heart attack within the last 6 months?no  Do you take weight loss medication? no      Do you take any blood-thinning medications such as: (aspirin, warfarin, Plavix, Aggrenox)  no  If yes we need the name, milligram, dosage and who is prescribing doctor  Current Outpatient Medications on File Prior to Visit  Medication Sig Dispense Refill   ezetimibe (ZETIA) 10 MG tablet TAKE 1 TABLET EVERY DAY 90 tablet 3   fluocinonide ointment (LIDEX) 0.05 %      furosemide (LASIX) 40 MG tablet Take 1 tablet (40 mg total) by mouth daily. 30 tablet 3   hydrOXYzine (ATARAX/VISTARIL) 25 MG tablet TAKE 1 TABLET (25 MG TOTAL) BY MOUTH 3 (THREE) TIMES DAILY AS NEEDED. FOR ITCHING 180 tablet 0   No current facility-administered medications on file prior to visit.    Allergies  Allergen Reactions   Lipitor [Atorvastatin Calcium] Other (See Comments)    shaking   Sulfa Antibiotics Other (See Comments)    unknown   Zocor [Simvastatin - High Dose] Other (See Comments)    shaking     Pharmacy: [facility]  Primary Insurance Name: [facility]  Best number where you can be reached: [phone]

## 2022-12-19 MED ORDER — PEG 3350-KCL-NA BICARB-NACL 420 G PO SOLR: 4000.0000 mL | Freq: Once | ORAL | 0 refills | Status: AC

## 2022-12-19 NOTE — Telephone Encounter (Signed)
Pt wife returned call (ok per dpr) and scheduled pt for 01/19/23. Prep sent to pharmacy. Instructions will be mailed to patient per request.

## 2022-12-19 NOTE — Telephone Encounter (Signed)
Left message to return call 

## 2022-12-19 NOTE — Addendum Note (Signed)
Addended by: Marlowe Shores on: 12/19/2022 11:57 AM   Modules accepted: Orders

## 2022-12-20 ENCOUNTER — Encounter (INDEPENDENT_AMBULATORY_CARE_PROVIDER_SITE_OTHER): Payer: Self-pay | Admitting: *Deleted

## 2022-12-20 NOTE — Telephone Encounter (Signed)
refer

## 2022-12-20 NOTE — Telephone Encounter (Signed)
Referral completed

## 2022-12-22 DIAGNOSIS — I350 Nonrheumatic aortic (valve) stenosis: Secondary | ICD-10-CM | POA: Diagnosis not present

## 2022-12-28 ENCOUNTER — Telehealth: Payer: Self-pay | Admitting: *Deleted

## 2022-12-28 NOTE — Telephone Encounter (Signed)
Pt wife left voicemail needing to reschedule TCS due to car trouble.  Returned call to wife and rescheduled pt to 01/31/23 at 11:45am. Will mail out updated instructions.

## 2022-12-28 NOTE — Telephone Encounter (Signed)
Pt's wife called in and left VM about rescheduling pt's appt for lung screening. Attempted to call her back but had to leave a message for her to call back.

## 2022-12-28 NOTE — Telephone Encounter (Signed)
Patient's wife called back. Pt/pt's wife's car is in the shop and they do not have reliable transportation at this time. Pt has been rescheduled 7/16 for SDMV and 7/26 for LDCT. Nothing further needed at this time.

## 2023-01-05 ENCOUNTER — Encounter: Payer: Medicare HMO | Admitting: Physician Assistant

## 2023-01-08 ENCOUNTER — Ambulatory Visit (HOSPITAL_COMMUNITY): Payer: Medicare HMO

## 2023-01-09 ENCOUNTER — Encounter: Payer: Medicare HMO | Admitting: Family Medicine

## 2023-01-16 ENCOUNTER — Telehealth (INDEPENDENT_AMBULATORY_CARE_PROVIDER_SITE_OTHER): Payer: Self-pay | Admitting: Gastroenterology

## 2023-01-16 ENCOUNTER — Other Ambulatory Visit (INDEPENDENT_AMBULATORY_CARE_PROVIDER_SITE_OTHER): Payer: Self-pay | Admitting: Gastroenterology

## 2023-01-16 MED ORDER — PEG 3350-KCL-NA BICARB-NACL 420 G PO SOLR: 4000.0000 mL | Freq: Once | ORAL | 0 refills | Status: AC

## 2023-01-16 NOTE — Telephone Encounter (Signed)
Prep resent to [facility]

## 2023-01-31 ENCOUNTER — Ambulatory Visit (HOSPITAL_COMMUNITY): Payer: Self-pay | Admitting: Anesthesiology

## 2023-01-31 ENCOUNTER — Ambulatory Visit (HOSPITAL_COMMUNITY)
Admission: RE | Admit: 2023-01-31 | Discharge: 2023-01-31 | Disposition: A | Discharge status: Home / Self Care | Payer: Medicare HMO | Attending: Gastroenterology | Admitting: Gastroenterology

## 2023-01-31 ENCOUNTER — Encounter (HOSPITAL_COMMUNITY)
Admission: RE | Disposition: A | Discharge status: Home / Self Care | Payer: Self-pay | Source: Home / Self Care | Attending: Gastroenterology

## 2023-01-31 ENCOUNTER — Encounter (HOSPITAL_COMMUNITY): Payer: Self-pay | Admitting: Gastroenterology

## 2023-01-31 ENCOUNTER — Telehealth (INDEPENDENT_AMBULATORY_CARE_PROVIDER_SITE_OTHER): Payer: Self-pay | Admitting: *Deleted

## 2023-01-31 ENCOUNTER — Encounter (INDEPENDENT_AMBULATORY_CARE_PROVIDER_SITE_OTHER): Payer: Self-pay | Admitting: *Deleted

## 2023-01-31 ENCOUNTER — Ambulatory Visit (HOSPITAL_BASED_OUTPATIENT_CLINIC_OR_DEPARTMENT_OTHER): Payer: Medicare HMO | Admitting: Anesthesiology

## 2023-01-31 ENCOUNTER — Other Ambulatory Visit: Payer: Self-pay

## 2023-01-31 DIAGNOSIS — D123 Benign neoplasm of transverse colon: Secondary | ICD-10-CM | POA: Diagnosis not present

## 2023-01-31 DIAGNOSIS — E785 Hyperlipidemia, unspecified: Secondary | ICD-10-CM | POA: Insufficient documentation

## 2023-01-31 DIAGNOSIS — F17200 Nicotine dependence, unspecified, uncomplicated: Secondary | ICD-10-CM | POA: Insufficient documentation

## 2023-01-31 DIAGNOSIS — Z1211 Encounter for screening for malignant neoplasm of colon: Secondary | ICD-10-CM

## 2023-01-31 DIAGNOSIS — D128 Benign neoplasm of rectum: Secondary | ICD-10-CM | POA: Diagnosis not present

## 2023-01-31 DIAGNOSIS — D122 Benign neoplasm of ascending colon: Secondary | ICD-10-CM | POA: Diagnosis not present

## 2023-01-31 DIAGNOSIS — Z86010 Personal history of colonic polyps: Secondary | ICD-10-CM

## 2023-01-31 DIAGNOSIS — G4730 Sleep apnea, unspecified: Secondary | ICD-10-CM | POA: Diagnosis not present

## 2023-01-31 DIAGNOSIS — K648 Other hemorrhoids: Secondary | ICD-10-CM

## 2023-01-31 DIAGNOSIS — J449 Chronic obstructive pulmonary disease, unspecified: Secondary | ICD-10-CM | POA: Insufficient documentation

## 2023-01-31 DIAGNOSIS — Z6835 Body mass index (BMI) 35.0-35.9, adult: Secondary | ICD-10-CM | POA: Insufficient documentation

## 2023-01-31 DIAGNOSIS — E119 Type 2 diabetes mellitus without complications: Secondary | ICD-10-CM | POA: Diagnosis not present

## 2023-01-31 DIAGNOSIS — Z139 Encounter for screening, unspecified: Secondary | ICD-10-CM | POA: Diagnosis not present

## 2023-01-31 DIAGNOSIS — K635 Polyp of colon: Secondary | ICD-10-CM | POA: Diagnosis not present

## 2023-01-31 DIAGNOSIS — Z7984 Long term (current) use of oral hypoglycemic drugs: Secondary | ICD-10-CM | POA: Diagnosis not present

## 2023-01-31 DIAGNOSIS — I10 Essential (primary) hypertension: Secondary | ICD-10-CM | POA: Insufficient documentation

## 2023-01-31 DIAGNOSIS — D125 Benign neoplasm of sigmoid colon: Secondary | ICD-10-CM

## 2023-01-31 DIAGNOSIS — D126 Benign neoplasm of colon, unspecified: Secondary | ICD-10-CM | POA: Diagnosis not present

## 2023-01-31 DIAGNOSIS — D124 Benign neoplasm of descending colon: Secondary | ICD-10-CM | POA: Diagnosis not present

## 2023-01-31 DIAGNOSIS — E669 Obesity, unspecified: Secondary | ICD-10-CM | POA: Insufficient documentation

## 2023-01-31 HISTORY — PX: COLONOSCOPY WITH PROPOFOL: SHX5780

## 2023-01-31 HISTORY — PX: POLYPECTOMY: SHX5525

## 2023-01-31 HISTORY — PX: HEMOSTASIS CLIP PLACEMENT: SHX6857

## 2023-01-31 LAB — HM COLONOSCOPY

## 2023-01-31 LAB — GLUCOSE, CAPILLARY: Glucose-Capillary: 106 mg/dL — ABNORMAL HIGH (ref 70–99)

## 2023-01-31 SURGERY — COLONOSCOPY WITH PROPOFOL
Anesthesia: General

## 2023-01-31 MED ORDER — PHENYLEPHRINE 80 MCG/ML (10ML) SYRINGE FOR IV PUSH (FOR BLOOD PRESSURE SUPPORT)
PREFILLED_SYRINGE | INTRAVENOUS | Status: AC
  Filled 2023-01-31: qty 10

## 2023-01-31 MED ORDER — PROPOFOL 10 MG/ML IV BOLUS
INTRAVENOUS | Status: DC | PRN
  Administered 2023-01-31: 50 mg via INTRAVENOUS
  Administered 2023-01-31: 100 mg via INTRAVENOUS
  Administered 2023-01-31 (×5): 50 mg via INTRAVENOUS

## 2023-01-31 MED ORDER — PHENYLEPHRINE 80 MCG/ML (10ML) SYRINGE FOR IV PUSH (FOR BLOOD PRESSURE SUPPORT)
PREFILLED_SYRINGE | INTRAVENOUS | Status: DC | PRN
  Administered 2023-01-31 (×4): 160 ug via INTRAVENOUS

## 2023-01-31 MED ORDER — PROPOFOL 500 MG/50ML IV EMUL
INTRAVENOUS | Status: DC | PRN
  Administered 2023-01-31: 150 ug/kg/min via INTRAVENOUS

## 2023-01-31 MED ORDER — LIDOCAINE HCL (CARDIAC) PF 100 MG/5ML IV SOSY
PREFILLED_SYRINGE | INTRAVENOUS | Status: DC | PRN
  Administered 2023-01-31: 50 mg via INTRAVENOUS

## 2023-01-31 MED ORDER — LACTATED RINGERS IV SOLN
INTRAVENOUS | Status: DC
  Administered 2023-01-31: 1000 mL via INTRAVENOUS

## 2023-01-31 MED ORDER — STERILE WATER FOR IRRIGATION IR SOLN
Status: DC | PRN
  Administered 2023-01-31: 60 mL

## 2023-01-31 NOTE — Discharge Instructions (Signed)
-   Discharge patient to home (ambulatory).  - Resume previous diet.  - Await pathology results.  - Repeat colonoscopy in 1 year for surveillance.  - Will refer to genetic counselor given colonic polyposis

## 2023-01-31 NOTE — Telephone Encounter (Signed)
-----   Message from [name], MD sent at 01/31/2023 12:27 PM EDT ----- Casandra Doffing, can you please refer the patient to genetic counseling? Dx: colonic polyposis  Thanks,   [name], MD Gastroenterology and Hepatology [facility]

## 2023-01-31 NOTE — Anesthesia Postprocedure Evaluation (Signed)
Anesthesia Post Note  Patient: Ricky Rios  Procedure(s) Performed: COLONOSCOPY WITH PROPOFOL POLYPECTOMY HEMOSTASIS CLIP PLACEMENT  Patient location during evaluation: Phase II Anesthesia Type: General Level of consciousness: awake and alert and oriented Pain management: pain level controlled Vital Signs Assessment: post-procedure vital signs reviewed and stable Respiratory status: spontaneous breathing, nonlabored ventilation and respiratory function stable Cardiovascular status: blood pressure returned to baseline and stable Postop Assessment: no apparent nausea or vomiting Anesthetic complications: no  No notable events documented.   Last Vitals:  Vitals:   01/31/23 1032 01/31/23 1215  BP: (!) 132/98 (!) 101/46  Pulse: 77 75  Resp: 13 17  Temp: 36.8 C   SpO2: 97% 95%    Last Pain:  Vitals:   01/31/23 1215  TempSrc: Oral  PainSc: 0-No pain                 Giovanny Dugal C Duval Macleod

## 2023-01-31 NOTE — Anesthesia Preprocedure Evaluation (Addendum)
Anesthesia Evaluation  Patient identified by MRN, date of birth, ID band Patient awake    Reviewed: Allergy & Precautions, H&P , NPO status , Patient's Chart, lab work & pertinent test results  Airway Mallampati: II  TM Distance: >3 FB Neck ROM: Full    Dental  (+) Edentulous Upper, Edentulous Lower   Pulmonary sleep apnea , COPD, Current Smoker and Patient abstained from smoking.   Pulmonary exam normal breath sounds clear to auscultation       Cardiovascular Exercise Tolerance: Good hypertension, Pt. on medications Normal cardiovascular exam Rhythm:Regular Rate:Normal     Neuro/Psych negative neurological ROS  negative psych ROS   GI/Hepatic negative GI ROS, Neg liver ROS,,,  Endo/Other  diabetes, Well Controlled, Type 2, Oral Hypoglycemic Agents    Renal/GU negative Renal ROS  negative genitourinary   Musculoskeletal  (+) Arthritis , Osteoarthritis,    Abdominal   Peds negative pediatric ROS (+)  Hematology negative hematology ROS (+)   Anesthesia Other Findings   Reproductive/Obstetrics negative OB ROS                             Anesthesia Physical Anesthesia Plan  ASA: 2  Anesthesia Plan: General   Post-op Pain Management: Minimal or no pain anticipated   Induction: Intravenous  PONV Risk Score and Plan: 1 and Propofol infusion  Airway Management Planned: Nasal Cannula and Natural Airway  Additional Equipment:   Intra-op Plan:   Post-operative Plan:   Informed Consent: I have reviewed the patients History and Physical, chart, labs and discussed the procedure including the risks, benefits and alternatives for the proposed anesthesia with the patient or authorized representative who has indicated his/her understanding and acceptance.       Plan Discussed with: CRNA and Surgeon  Anesthesia Plan Comments:        Anesthesia Quick Evaluation

## 2023-01-31 NOTE — Anesthesia Procedure Notes (Signed)
Date/Time: 01/31/2023 11:07 AM  Performed by: Julian Reil, CRNAPre-anesthesia Checklist: Patient identified, Emergency Drugs available, Suction available and Patient being monitored Oxygen Delivery Method: Simple face mask Induction Type: IV induction Placement Confirmation: positive ETCO2

## 2023-01-31 NOTE — Transfer of Care (Signed)
Immediate Anesthesia Transfer of Care Note  Patient: Ricky Rios  Procedure(s) Performed: COLONOSCOPY WITH PROPOFOL POLYPECTOMY HEMOSTASIS CLIP PLACEMENT  Patient Location: Endoscopy Unit  Anesthesia Type:General  Level of Consciousness: drowsy  Airway & Oxygen Therapy: Patient Spontanous Breathing  Post-op Assessment: Report given to RN and Post -op Vital signs reviewed and stable  Post vital signs: Reviewed and stable  Last Vitals:  Vitals Value Taken Time  BP    Temp    Pulse    Resp    SpO2      Last Pain:  Vitals:   01/31/23 1102  TempSrc:   PainSc: 6       Patients Stated Pain Goal: 6 (01/31/23 1032)  Complications: No notable events documented.

## 2023-01-31 NOTE — H&P (Signed)
Ricky Rios is an 72 y.o. male.   Chief Complaint: screening colonoscopy HPI: 72 y/o with PMH diabetes, COPD, hyperlipidemia, hypertension, sleep apnea, coming for history of colonic polyps.  States that last colonoscopy was close to 10 years ago and had some polyps removed, no reports are available.  The patient denies having any complaints such as melena, hematochezia, abdominal pain or distention, change in her bowel movement consistency or frequency, no changes in weight recently.  No family history of colorectal cancer.   Past Medical History:  Diagnosis Date   Allergy    Phreesia 03/01/2020   Arthritis    Phreesia 03/01/2020   Cataract    Phreesia 03/01/2020   Chest pain    Diagnostic cardiac cath October 2012. Normal coronary arteries, left ventricular function   COPD (chronic obstructive pulmonary disease) (HCC)    Phreesia 03/01/2020   DDD (degenerative disc disease), lumbar    Diabetes mellitus    Diabetes mellitus without complication (HCC)    Phreesia 03/01/2020   History of back surgery    History of neck surgery    HLD (hyperlipidemia) 12/09/2012   Hyperlipidemia    Phreesia 03/01/2020   Hypertension    Obesity    Right knee injury    Sleep apnea    Smoker     Past Surgical History:  Procedure Laterality Date   APPENDECTOMY     BACK SURGERY     for ruptured disc lumbar area   CARDIAC CATHETERIZATION  Oct 2012   nml coronary arteries, aorta, LV fcn   EYE SURGERY N/A    Phreesia 03/01/2020   I & D EXTREMITY Right 02/06/2013   Procedure: IRRIGATION AND DEBRIDEMENT INDEX FINGER;  Surgeon: Tami Ribas, MD;  Location: [facility] OR;  Service: Orthopedics;  Laterality: Right;   NECK SURGERY     for ruptured disc   SPINE SURGERY N/A    Phreesia 03/01/2020   TONSILLECTOMY      Family History  Problem Relation Age of Onset   Dementia Mother    Heart disease Father    Social History:  reports that he has been smoking cigarettes. He has been smoking an average  of 2 packs per day. He has never used smokeless tobacco. He reports that he does not drink alcohol and does not use drugs.  Allergies:  Allergies  Allergen Reactions   Lipitor [Atorvastatin Calcium] Other (See Comments)    shaking   Sulfa Antibiotics Other (See Comments)    unknown   Zocor [Simvastatin - High Dose] Other (See Comments)    shaking    Medications Prior to Admission  Medication Sig Dispense Refill   acetaminophen (TYLENOL) 500 MG tablet Take 1,000 mg by mouth every 8 (eight) hours as needed for moderate pain.     Clobetasol Propionate 0.05 % lotion Apply 1 Application topically 2 (two) times daily as needed (psoriasis).     ezetimibe (ZETIA) 10 MG tablet TAKE 1 TABLET EVERY DAY 90 tablet 3   furosemide (LASIX) 40 MG tablet Take 1 tablet (40 mg total) by mouth daily. (Patient not taking: Reported on 01/29/2023) 30 tablet 3   hydrOXYzine (ATARAX/VISTARIL) 25 MG tablet TAKE 1 TABLET (25 MG TOTAL) BY MOUTH 3 (THREE) TIMES DAILY AS NEEDED. FOR ITCHING (Patient not taking: Reported on 01/29/2023) 180 tablet 0    Results for orders placed or performed during the hospital encounter of 01/31/23 (from the past 48 hour(s))  Glucose, capillary     Status: Abnormal  Collection Time: 01/31/23 10:36 AM  Result Value Ref Range   Glucose-Capillary 106 (H) 70 - 99 mg/dL    Comment: Glucose reference range applies only to samples taken after fasting for at least 8 hours.   No results found.  Review of Systems  All other systems reviewed and are negative.   Blood pressure (!) 132/98, pulse 77, temperature 98.2 F (36.8 C), temperature source Oral, resp. rate 13, height 6' (1.829 m), weight 120.2 kg, SpO2 97 %. Physical Exam  GENERAL: The patient is AO x3, in no acute distress. HEENT: Head is normocephalic and atraumatic. EOMI are intact. Mouth is well hydrated and without lesions. NECK: Supple. No masses LUNGS: Clear to auscultation. No presence of rhonchi/wheezing/rales. Adequate  chest expansion HEART: RRR, normal s1 and s2. ABDOMEN: Soft, nontender, no guarding, no peritoneal signs, and nondistended. BS +. No masses. EXTREMITIES: Without any cyanosis, clubbing, rash, lesions or edema. NEUROLOGIC: AOx3, no focal motor deficit. SKIN: no jaundice, no rashes  Assessment/Plan 72 y/o with PMH diabetes, COPD, hyperlipidemia, hypertension, sleep apnea, coming for history of colonic polyps.  Will proceed with colonoscopy.  [name], MD 01/31/2023, 10:59 AM

## 2023-01-31 NOTE — Op Note (Signed)
[facility] Patient Name: Ricky Rios Procedure Date: 01/31/2023 10:59 AM MRN: [id number] Date of Birth: 12/25/1950 Attending MD: [name] , , [id number] CSN: [id number] Age: 72 Admit Type: Outpatient Procedure:                Colonoscopy Indications:              Surveillance: Personal history of colonic polyps                            (unknown histology) on last colonoscopy more than 5                            years ago Providers:                [name], Angelica Ran, Pandora Leiter,                            Technician Referring MD:              Medicines:                Monitored Anesthesia Care Complications:            No immediate complications. Estimated Blood Loss:     Estimated blood loss: none. Procedure:                Pre-Anesthesia Assessment:                           - Prior to the procedure, a History and Physical                            was performed, and patient medications, allergies                            and sensitivities were reviewed. The patient's                            tolerance of previous anesthesia was reviewed.                           - The risks and benefits of the procedure and the                            sedation options and risks were discussed with the                            patient. All questions were answered and informed                            consent was obtained.                           - ASA Grade Assessment: II - A patient with mild                            systemic disease.  After obtaining informed consent, the colonoscope                            was passed under direct vision. Throughout the                            procedure, the patient's blood pressure, pulse, and                            oxygen saturations were monitored continuously. The                            [id number]-HQ190L ([id number]) scope was introduced through                            the  anus and advanced to the the cecum, identified                            by appendiceal orifice and ileocecal valve. The                            colonoscopy was performed without difficulty. The                            patient tolerated the procedure well. The quality                            of the bowel preparation was adequate. Scope In: 11:07:40 AM Scope Out: 12:13:23 PM Scope Withdrawal Time: 0 hours 48 minutes 18 seconds  Total Procedure Duration: 1 hour 5 minutes 43 seconds  Findings:      The perianal and digital rectal examinations were normal.      Three sessile polyps were found in the ascending colon. The polyps were       3 to 8 mm in size. These polyps were removed with a cold snare.       Resection and retrieval were complete. To stop active bleeding, one       hemostatic clip was successfully placed. Clip manufacturer: [employer]. There was no bleeding at the end of the procedure.      A 14 mm polyp was found in the ascending colon. The polyp was sessile.       Polypectomy was attempted, initially using a cold snare. Polyp resection       was incomplete with this device. This intervention then required a       different device and polypectomy technique. The polyp was removed with a       hot snare. Resection and retrieval were complete. To close a defect       after polypectomy, one hemostatic clip was successfully placed. Clip       manufacturer: [employer]. There was no bleeding at the end of the       procedure.      Eight sessile polyps were found in the transverse colon. The polyps were       3 to 10 mm in size. These polyps were removed with a cold snare.  Resection and retrieval were complete.      Twelve sessile polyps were found in the rectum, sigmoid colon and       descending colon. The polyps were 2 to 10 mm in size. These polyps were       removed with a cold snare. Resection and retrieval were complete.      Non-bleeding  internal hemorrhoids were found during retroflexion. The       hemorrhoids were small. Impression:               - Three 3 to 8 mm polyps in the ascending colon,                            removed with a cold snare. Resected and retrieved.                            Clip was placed. Clip manufacturer: [employer].                           - One 14 mm polyp in the ascending colon, removed                            with a hot snare. Resected and retrieved. Clip was                            placed. Clip manufacturer: [employer].                           - Eight 3 to 10 mm polyps in the transverse colon,                            removed with a cold snare. Resected and retrieved.                           - Twelve 2 to 10 mm polyps in the rectum, in the                            sigmoid colon and in the descending colon, removed                            with a cold snare. Resected and retrieved.                           - Non-bleeding internal hemorrhoids. Moderate Sedation:      Per Anesthesia Care Recommendation:           - Discharge patient to home (ambulatory).                           - Resume previous diet.                           - Await  pathology results.                           - Repeat colonoscopy in 1 year for surveillance.                           - Will refer to genetic counselor given colonic                            polyposis Procedure Code(s):        --- Professional ---                           [phone], Colonoscopy, flexible; with removal of                            tumor(s), polyp(s), or other lesion(s) by snare                            technique Diagnosis Code(s):        --- Professional ---                           Z86.010, Personal history of colonic polyps                           D12.2, Benign neoplasm of ascending colon                           D12.3, Benign neoplasm of transverse colon (hepatic                             flexure or splenic flexure)                           D12.8, Benign neoplasm of rectum                           D12.5, Benign neoplasm of sigmoid colon                           D12.4, Benign neoplasm of descending colon                           K64.8, Other hemorrhoids CPT copyright 2022 American Medical Association. All rights reserved. The codes documented in this report are preliminary and upon coder review may  be revised to meet current compliance requirements. [name], MD [name],  01/31/2023 12:25:52 PM This report has been signed electronically. Number of Addenda: 0

## 2023-01-31 NOTE — Telephone Encounter (Signed)
Referral placed in epic.

## 2023-02-01 ENCOUNTER — Encounter (INDEPENDENT_AMBULATORY_CARE_PROVIDER_SITE_OTHER): Payer: Self-pay | Admitting: *Deleted

## 2023-02-01 LAB — SURGICAL PATHOLOGY

## 2023-02-05 ENCOUNTER — Telehealth: Payer: Self-pay | Admitting: Genetic Counselor

## 2023-02-05 NOTE — Telephone Encounter (Signed)
Left message regarding patient upcoming appointment with Genetic Counselor

## 2023-02-06 ENCOUNTER — Ambulatory Visit (INDEPENDENT_AMBULATORY_CARE_PROVIDER_SITE_OTHER): Payer: Medicare HMO | Admitting: Acute Care

## 2023-02-06 ENCOUNTER — Encounter: Payer: Self-pay | Admitting: Acute Care

## 2023-02-06 ENCOUNTER — Telehealth: Payer: Self-pay | Admitting: Acute Care

## 2023-02-06 DIAGNOSIS — F17210 Nicotine dependence, cigarettes, uncomplicated: Secondary | ICD-10-CM | POA: Diagnosis not present

## 2023-02-06 NOTE — Patient Instructions (Signed)
Thank you for participating in the [facility]. It was our pleasure to meet you today. We will call you with the results of your scan within the next few days. Your scan will be assigned a Lung RADS category score by the physicians reading the scans.  This Lung RADS score determines follow up scanning.  See below for description of categories, and follow up screening recommendations. We will be in touch to schedule your follow up screening annually or based on recommendations of our providers. We will fax a copy of your scan results to your Primary Care Physician, or the physician who referred you to the program, to ensure they have the results. Please call the office if you have any questions or concerns regarding your scanning experience or results.  Our office number is [phone]. Please speak with Abigail Miyamoto, RN. , or  Karlton Lemon RN, and Pietro Cassis, Charity fundraiser. They are  our Lung Cancer Screening RN.'s If They are unavailable when you call, Please leave a message on the voice mail. We will return your call at our earliest convenience.This voice mail is monitored several times a day.  Remember, if your scan is normal, we will scan you annually as long as you continue to meet the criteria for the program. (Age 46-80, Current smoker or smoker who has quit within the last 15 years). If you are a smoker, remember, quitting is the single most powerful action that you can take to decrease your risk of lung cancer and other pulmonary, breathing related problems. We know quitting is hard, and we are here to help.  Please let us know if there is anything we can do to help you meet your goal of quitting. If you are a former smoker, Counselling psychologist. We are proud of you! Remain smoke free! Remember you can refer friends or family members through the number above.  We will screen them to make sure they meet criteria for the program. Thank you for helping [place] take better care  of you by participating in Lung Screening.  You can receive free nicotine replacement therapy ( patches, gum or mints) by calling 1-800-QUIT NOW. Please call so we can get you on the path to becoming  a non-smoker. I know it is hard, but you can do this!  Lung RADS Categories:  Lung RADS 1: no nodules or definitely non-concerning nodules.  Recommendation is for a repeat annual scan in 12 months.  Lung RADS 2:  nodules that are non-concerning in appearance and behavior with a very low likelihood of becoming an active cancer. Recommendation is for a repeat annual scan in 12 months.  Lung RADS 3: nodules that are probably non-concerning , includes nodules with a low likelihood of becoming an active cancer.  Recommendation is for a 67-month repeat screening scan. Often noted after an upper respiratory illness. We will be in touch to make sure you have no questions, and to schedule your 75-month scan.  Lung RADS 4 A: nodules with concerning findings, recommendation is most often for a follow up scan in 3 months or additional testing based on our provider's assessment of the scan. We will be in touch to make sure you have no questions and to schedule the recommended 3 month follow up scan.  Lung RADS 4 B:  indicates findings that are concerning. We will be in touch with you to schedule additional diagnostic testing based on our provider's  assessment of the scan.  Other options for  assistance in smoking cessation ( As covered by your insurance benefits)  Hypnosis for smoking cessation  [employer] [phone]  Acupuncture for smoking cessation  [employer] [phone]

## 2023-02-06 NOTE — Telephone Encounter (Signed)
I have attempted to call the patient for their shared decision making visit. There was no answer. We will try again in 5 minutes.

## 2023-02-06 NOTE — Progress Notes (Signed)
Virtual Visit via Telephone Note  I connected with Cordelia Poche on 02/06/23 at  2:30 PM EDT by telephone and verified that I am speaking with the correct person using two identifiers.  Location: Patient: at home Provider: [address], [place], [place], [address]    I discussed the limitations, risks, security and privacy concerns of performing an evaluation and management service by telephone and the availability of in person appointments. I also discussed with the patient that there may be a patient responsible charge related to this service. The patient expressed understanding and agreed to proceed.   Shared Decision Making Visit Lung Cancer Screening Program [phone])   Eligibility: Age 58 y.o. Pack Years Smoking History Calculation 106 (# packs/per year x # years smoked) Recent History of coughing up blood  no Unexplained weight loss? no ( >Than 15 pounds within the last 6 months ) Prior History Lung / other cancer no (Diagnosis within the last 5 years already requiring surveillance chest CT Scans). Smoking Status Current Smoker Former Smokers: Years since quit: n/a  Quit Date: n/a  Visit Components: Discussion included one or more decision making aids. yes Discussion included risk/benefits of screening. yes Discussion included potential follow up diagnostic testing for abnormal scans. yes Discussion included meaning and risk of over diagnosis. yes Discussion included meaning and risk of False Positives. yes Discussion included meaning of total radiation exposure. yes  Counseling Included: Importance of adherence to annual lung cancer LDCT screening. yes Impact of comorbidities on ability to participate in the program. yes Ability and willingness to under diagnostic treatment. yes  Smoking Cessation Counseling: Current Smokers:  Discussed importance of smoking cessation. yes Information about tobacco cessation classes and interventions provided to patient.  yes Patient provided with "ticket" for LDCT Scan. yes Symptomatic Patient. no  Counseling: n/a Diagnosis Code: Tobacco Use Z72.0 Asymptomatic Patient yes  Counseling (Intermediate counseling: > three minutes counseling) U0454 Former Smokers:  Discussed the importance of maintaining cigarette abstinence. yes Diagnosis Code: Personal History of Nicotine Dependence. U98.119 Information about tobacco cessation classes and interventions provided to patient. Yes Patient provided with "ticket" for LDCT Scan. N/a Written Order for Lung Cancer Screening with LDCT placed in Epic. Yes (CT Chest Lung Cancer Screening Low Dose W/O CM) [id number] Z12.2-Screening of respiratory organs Z87.891-Personal history of nicotine dependence     Bevelyn Ngo, NP 02/06/2023

## 2023-02-08 NOTE — Telephone Encounter (Signed)
Visit completed.

## 2023-02-16 ENCOUNTER — Ambulatory Visit (HOSPITAL_COMMUNITY): Admission: RE | Admit: 2023-02-16 | Payer: Medicare HMO | Source: Ambulatory Visit

## 2023-02-16 DIAGNOSIS — F17210 Nicotine dependence, cigarettes, uncomplicated: Secondary | ICD-10-CM | POA: Diagnosis not present

## 2023-02-16 DIAGNOSIS — Z87891 Personal history of nicotine dependence: Secondary | ICD-10-CM | POA: Insufficient documentation

## 2023-02-16 DIAGNOSIS — Z122 Encounter for screening for malignant neoplasm of respiratory organs: Secondary | ICD-10-CM | POA: Diagnosis not present

## 2023-02-22 DIAGNOSIS — M5416 Radiculopathy, lumbar region: Secondary | ICD-10-CM | POA: Diagnosis not present

## 2023-02-26 ENCOUNTER — Other Ambulatory Visit: Payer: Self-pay | Admitting: Acute Care

## 2023-02-26 DIAGNOSIS — Z122 Encounter for screening for malignant neoplasm of respiratory organs: Secondary | ICD-10-CM

## 2023-02-26 DIAGNOSIS — F17210 Nicotine dependence, cigarettes, uncomplicated: Secondary | ICD-10-CM

## 2023-02-26 DIAGNOSIS — Z87891 Personal history of nicotine dependence: Secondary | ICD-10-CM

## 2023-03-06 ENCOUNTER — Inpatient Hospital Stay: Payer: Medicare HMO

## 2023-03-06 ENCOUNTER — Inpatient Hospital Stay: Payer: Medicare HMO | Admitting: Genetic Counselor

## 2023-03-15 DIAGNOSIS — M5416 Radiculopathy, lumbar region: Secondary | ICD-10-CM | POA: Diagnosis not present

## 2023-04-05 ENCOUNTER — Inpatient Hospital Stay: Payer: Medicare HMO

## 2023-04-05 ENCOUNTER — Inpatient Hospital Stay: Payer: Medicare HMO | Admitting: Licensed Clinical Social Worker

## 2023-04-27 ENCOUNTER — Other Ambulatory Visit: Payer: Self-pay | Admitting: Family Medicine

## 2023-04-27 DIAGNOSIS — Z23 Encounter for immunization: Secondary | ICD-10-CM | POA: Diagnosis not present

## 2023-04-27 DIAGNOSIS — J449 Chronic obstructive pulmonary disease, unspecified: Secondary | ICD-10-CM | POA: Diagnosis not present

## 2023-04-27 DIAGNOSIS — I700 Atherosclerosis of aorta: Secondary | ICD-10-CM | POA: Diagnosis not present

## 2023-04-27 DIAGNOSIS — I10 Essential (primary) hypertension: Secondary | ICD-10-CM | POA: Diagnosis not present

## 2023-04-27 DIAGNOSIS — K7460 Unspecified cirrhosis of liver: Secondary | ICD-10-CM

## 2023-04-27 DIAGNOSIS — D696 Thrombocytopenia, unspecified: Secondary | ICD-10-CM | POA: Diagnosis not present

## 2023-04-27 DIAGNOSIS — D6959 Other secondary thrombocytopenia: Secondary | ICD-10-CM | POA: Diagnosis not present

## 2023-04-27 DIAGNOSIS — G4733 Obstructive sleep apnea (adult) (pediatric): Secondary | ICD-10-CM | POA: Diagnosis not present

## 2023-04-27 DIAGNOSIS — E785 Hyperlipidemia, unspecified: Secondary | ICD-10-CM | POA: Diagnosis not present

## 2023-04-27 DIAGNOSIS — E119 Type 2 diabetes mellitus without complications: Secondary | ICD-10-CM | POA: Diagnosis not present

## 2023-04-27 DIAGNOSIS — I350 Nonrheumatic aortic (valve) stenosis: Secondary | ICD-10-CM | POA: Diagnosis not present

## 2023-05-08 ENCOUNTER — Ambulatory Visit
Admission: RE | Admit: 2023-05-08 | Discharge: 2023-05-08 | Disposition: A | Discharge status: Home / Self Care | Payer: Medicare HMO | Source: Ambulatory Visit | Attending: Family Medicine | Admitting: Family Medicine

## 2023-05-08 DIAGNOSIS — K7460 Unspecified cirrhosis of liver: Secondary | ICD-10-CM

## 2023-05-10 ENCOUNTER — Encounter (INDEPENDENT_AMBULATORY_CARE_PROVIDER_SITE_OTHER): Payer: Self-pay | Admitting: Gastroenterology

## 2023-05-10 ENCOUNTER — Telehealth (INDEPENDENT_AMBULATORY_CARE_PROVIDER_SITE_OTHER): Payer: Self-pay | Admitting: Gastroenterology

## 2023-05-10 ENCOUNTER — Ambulatory Visit (INDEPENDENT_AMBULATORY_CARE_PROVIDER_SITE_OTHER): Payer: Medicare HMO | Admitting: Gastroenterology

## 2023-05-10 VITALS — BP 131/76 | HR 90 | Temp 97.1°F | Ht 72.0 in | Wt 263.5 lb

## 2023-05-10 DIAGNOSIS — Z860101 Personal history of adenomatous and serrated colon polyps: Secondary | ICD-10-CM | POA: Diagnosis not present

## 2023-05-10 DIAGNOSIS — K625 Hemorrhage of anus and rectum: Secondary | ICD-10-CM

## 2023-05-10 DIAGNOSIS — K766 Portal hypertension: Secondary | ICD-10-CM

## 2023-05-10 DIAGNOSIS — K7581 Nonalcoholic steatohepatitis (NASH): Secondary | ICD-10-CM | POA: Diagnosis not present

## 2023-05-10 DIAGNOSIS — K5904 Chronic idiopathic constipation: Secondary | ICD-10-CM

## 2023-05-10 DIAGNOSIS — K5900 Constipation, unspecified: Secondary | ICD-10-CM | POA: Diagnosis not present

## 2023-05-10 DIAGNOSIS — K7460 Unspecified cirrhosis of liver: Secondary | ICD-10-CM

## 2023-05-10 DIAGNOSIS — Z860100 Personal history of colon polyps, unspecified: Secondary | ICD-10-CM

## 2023-05-10 DIAGNOSIS — K649 Unspecified hemorrhoids: Secondary | ICD-10-CM | POA: Insufficient documentation

## 2023-05-10 DIAGNOSIS — Z1159 Encounter for screening for other viral diseases: Secondary | ICD-10-CM

## 2023-05-10 MED ORDER — LACTULOSE 10 GM/15ML PO SOLN
20.0000 g | Freq: Two times a day (BID) | ORAL | 0 refills | Status: DC
Start: 2023-05-10 — End: 2023-09-20

## 2023-05-10 NOTE — Progress Notes (Signed)
Ricky Rios, M.D. Gastroenterology & Hepatology [facility] [address] [place], [place] [address]  Primary Care Physician: Ricky Coe, MD [address] [place] [address]  I will communicate my assessment and recommendations to the referring MD via EMR.  Problems: NASH cirrhosis  History of Present Illness: Ricky Rios is a 72 y.o. 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  The patient was last seen on 01/31/2023. At that time, the patient underwent screening colonoscopy, was found to have 24 polyps and was referred to genetic counseling.  The genetic counselor office tried to reach the patient but it seems that he could not see them.  Notably, the patient was seen in the past (2015) with Dr. Marina Rios for evaluation of possible liver cirrhosis.  Previous workup performed on 2015 showed an iron of 59, negative acute hepatitis panel, normal ceruloplasmin and alpha-1 antitrypsin level, negative AMA, ANA, smooth muscle.  It was considered that his cirrhosis was related to NASH. He did not have any follow-up after this appointment.    The patient had a CT of the chest for lung cancer screening, was found to have cirrhosis with splenomegaly and vascular collateralization in the left abdomen.  Last abdominal ultrasound on 12/23/2019 showed changes of liver cirrhosis with severe splenomegaly.  He reports having some fresh blood in stool intermittently. Has to strain to move his bowels. Has a BM every other day.  The patient denies having any nausea, vomiting, fever, chills,  melena, hematemesis, abdominal distention, abdominal pain, diarrhea, jaundice, pruritus or weight loss.  Cirrhosis related questions: Hematemesis/coffee ground emesis: No Abdominal pain: No Abdominal distention/worsening  ascitesNo Fever/chills: No Episodes of confusion/disorientation: No, but wife reports he "has trouble with his memory" Number of daily bowel movements: every other day Taking diuretics?: No History of variceal bleeding: No Prior history of banding?: No Prior episodes of SBP: No Last time liver imaging was performed: As above Last AFP: none available MELD 3.0 score: Unable to calculate Currently consuming alcohol: No, quit many years ago - >30 years ago Hepatitis A and B vaccination status: never vaccinated  Last EGD: never Last Colonoscopy: 01/31/2023 - Three 3 to 8 mm polyps in the ascending colon, removed with a cold snare. Resected and retrieved. Clip was placed. Clip manufacturer: [employer]. - One 14 mm polyp in the ascending colon, removed with a hot snare. Resected and retrieved. Clip was placed. Clip manufacturer: [employer]. - Eight 3 to 10 mm polyps in the transverse colon, removed with a cold snare. Resected and retrieved. - Twelve 2 to 10 mm polyps in the rectum, in the sigmoid colon and in the descending colon, removed with a cold snare. Resected and retrieved. - Non- bleeding internal hemorrhoids.  Pathology showed 24 tubular adenomas  Recommend a repeat colonoscopy in 1 year  Past Medical History: Past Medical History:  Diagnosis Date   Allergy    Phreesia 03/01/2020   Arthritis    Phreesia 03/01/2020   Cataract    Phreesia 03/01/2020   Chest pain    Diagnostic cardiac cath October 2012. Normal coronary arteries, left ventricular function   COPD (chronic obstructive pulmonary disease) (HCC)    Phreesia 03/01/2020   DDD (degenerative disc disease), lumbar    Diabetes mellitus    Diabetes mellitus without complication (HCC)    Phreesia 03/01/2020   History of back surgery  History of neck surgery    HLD (hyperlipidemia) 12/09/2012   Hyperlipidemia    Phreesia 03/01/2020   Hypertension    Obesity    Right knee injury    Sleep apnea    Smoker      Past Surgical History: Past Surgical History:  Procedure Laterality Date   APPENDECTOMY     BACK SURGERY     for ruptured disc lumbar area   CARDIAC CATHETERIZATION  Oct 2012   nml coronary arteries, aorta, LV fcn   COLONOSCOPY WITH PROPOFOL N/A 01/31/2023   Procedure: COLONOSCOPY WITH PROPOFOL;  Surgeon: [name], MD;  [facility];  Service: Gastroenterology;  Laterality: N/A;  12:45pm;ASA 1-2   EYE SURGERY N/A    Phreesia 03/01/2020   HEMOSTASIS CLIP PLACEMENT  01/31/2023   Procedure: HEMOSTASIS CLIP PLACEMENT;  Surgeon: [name], MD;  [facility]: [facility];  Service: Gastroenterology;;   I & D EXTREMITY Right 02/06/2013   Procedure: IRRIGATION AND DEBRIDEMENT INDEX FINGER;  Surgeon: Tami Ribas, MD;  Location: [facility];  Service: Orthopedics;  Laterality: Right;   NECK SURGERY     for ruptured disc   POLYPECTOMY  01/31/2023   Procedure: POLYPECTOMY;  Surgeon: [name], MD;  [facility];  Service: Gastroenterology;;   SPINE SURGERY N/A    Phreesia 03/01/2020   TONSILLECTOMY      Family History: Family History  Problem Relation Age of Onset   Dementia Mother    Heart disease Father     Social History: Social History   Tobacco Use  Smoking Status Every Day   Current packs/day: 2.00   Types: Cigarettes  Smokeless Tobacco Never  Tobacco Comments   Vaping more then cigs   Social History   Substance and Sexual Activity  Alcohol Use No   Social History   Substance and Sexual Activity  Drug Use No    Allergies: Allergies  Allergen Reactions   Lipitor [Atorvastatin Calcium] Other (See Comments)    shaking   Sulfa Antibiotics Other (See Comments)    unknown   Zocor [Simvastatin - High Dose] Other (See Comments)    shaking    Medications: Current Outpatient Medications  Medication Sig Dispense Refill   acetaminophen (TYLENOL) 500 MG tablet Take 1,000 mg by mouth every 8 (eight)  hours as needed for moderate pain.     Clobetasol Propionate 0.05 % lotion Apply 1 Application topically 2 (two) times daily as needed (psoriasis).     ezetimibe (ZETIA) 10 MG tablet TAKE 1 TABLET EVERY DAY 90 tablet 3   hydrOXYzine (ATARAX/VISTARIL) 25 MG tablet TAKE 1 TABLET (25 MG TOTAL) BY MOUTH 3 (THREE) TIMES DAILY AS NEEDED. FOR ITCHING (Patient not taking: Reported on 01/29/2023) 180 tablet 0   No current facility-administered medications for this visit.    Review of Systems: GENERAL: negative for malaise, night sweats HEENT: No changes in hearing or vision, no nose bleeds or other nasal problems. NECK: Negative for lumps, goiter, pain and significant neck swelling RESPIRATORY: Negative for cough, wheezing CARDIOVASCULAR: Negative for chest pain, leg swelling, palpitations, orthopnea GI: SEE HPI MUSCULOSKELETAL: Negative for joint pain or swelling, back pain, and muscle pain. SKIN: Negative for lesions, rash PSYCH: Negative for sleep disturbance, mood disorder and recent psychosocial stressors. HEMATOLOGY Negative for prolonged bleeding, bruising easily, and swollen nodes. ENDOCRINE: Negative for cold or heat intolerance, polyuria, polydipsia and goiter. NEURO: negative for tremor, gait imbalance, syncope and seizures. The remainder of  the review of systems is noncontributory.   Physical Exam: BP 131/76 (BP Location: Left Arm, Patient Position: Sitting, Cuff Size: Large)   Pulse 90   Temp (!) 97.1 F (36.2 C) (Temporal)   Ht 6' (1.829 m)   Wt 263 lb 8 oz (119.5 kg)   BMI 35.74 kg/m  GENERAL: The patient is AO x3, in no acute distress. HEENT: Head is normocephalic and atraumatic. EOMI are intact. Mouth is well hydrated and without lesions. NECK: Supple. No masses LUNGS: Clear to auscultation. No presence of rhonchi/wheezing/rales. Adequate chest expansion HEART: RRR, normal s1 and s2. ABDOMEN: Soft, nontender, no guarding, no peritoneal signs, and nondistended. BS +. No  masses. EXTREMITIES: Without any cyanosis, clubbing, rash, lesions or edema. NEUROLOGIC: AOx3, no focal motor deficit. SKIN: no jaundice, no rashes  Imaging/Labs: as above  I personally reviewed and interpreted the available labs, imaging and endoscopic files.  Impression and Plan: Ricky Rios is a 72 y.o. 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  The patient has presented compensated cirrhosis.  It seems that imaging is showing signs of portal hypertension, which is supported by his platelet count.  Due to this, we will need to proceed with an EGD to him for esophageal varices.  He is also due for [facility] screening, for which we will order ultrasound and AFP.  Will update MELD labs today, as well as we will check for immunity to hepatitis A and B.  Not presenting concerns of hepatic encephalopathy or ascites at the moment.  Patient is presenting some rectal bleeding especially when straining due to constipation.  I advised him to start taking lactulose which will be sent to his pharmacy.  Finally, he had presence of multiple colonic tubular adenomas, for which I will resend the referral for genetic counseling.  - Start lactulose 20 g daily, can increase to 2-3 times per day to achieve regular bowel movements and avoid straining - Schedule EGD - Check CBC, MELD labs and AFP - Referral to genetic counselor resent - Schedule liver doppler - Reduce salt intake to <2 g per day - Can take Tylenol max of 2 g per day (650 mg q8h) for pain - Avoid NSAIDs for pain - Avoid eating raw oysters/shellfish - Protein shake (Ensure or Boost) every night before going to sleep - Patient was counseled regarding the importance of stopping cigarette smoking. The patient was informed about the long term effects of smoking, progression of current disease.   All questions were answered.       Ricky Blazing, MD Gastroenterology and Hepatology [facility]

## 2023-05-10 NOTE — Patient Instructions (Addendum)
-   Start lactulose 20 g daily, can increase to 2-3 times per day to achieve regular bowel movements and avoid straining - Schedule EGD - Check CBC, MELD labs and AFP - Referral to genetic counselor resent - Schedule liver doppler - Reduce salt intake to <2 g per day - Can take Tylenol max of 2 g per day (650 mg q8h) for pain - Avoid NSAIDs for pain - Avoid eating raw oysters/shellfish - Protein shake (Ensure or Boost) every night before going to sleep - Patient was counseled regarding the importance of stopping cigarette smoking. The patient was informed about the long term effects of smoking, progression of current disease.

## 2023-05-10 NOTE — H&P (View-Only) (Signed)
[name], M.D. Gastroenterology & Hepatology [facility] [address] [place], [place] [address]  Primary Care Physician: Irven Coe, MD [address] [place] [address]  I will communicate my assessment and recommendations to the referring MD via EMR.  Problems: NASH cirrhosis  History of Present Illness: Ricky Rios is a 72 y.o. 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  The patient was last seen on 01/31/2023. At that time, the patient underwent screening colonoscopy, was found to have 24 polyps and was referred to genetic counseling.  The genetic counselor office tried to reach the patient but it seems that he could not see them.  Notably, the patient was seen in the past (2015) with Dr. Marina Goodell for evaluation of possible liver cirrhosis.  Previous workup performed on 2015 showed an iron of 59, negative acute hepatitis panel, normal ceruloplasmin and alpha-1 antitrypsin level, negative AMA, ANA, smooth muscle.  It was considered that his cirrhosis was related to NASH. He did not have any follow-up after this appointment.    The patient had a CT of the chest for lung cancer screening, was found to have cirrhosis with splenomegaly and vascular collateralization in the left abdomen.  Last abdominal ultrasound on 12/23/2019 showed changes of liver cirrhosis with severe splenomegaly.  He reports having some fresh blood in stool intermittently. Has to strain to move his bowels. Has a BM every other day.  The patient denies having any nausea, vomiting, fever, chills,  melena, hematemesis, abdominal distention, abdominal pain, diarrhea, jaundice, pruritus or weight loss.  Cirrhosis related questions: Hematemesis/coffee ground emesis: No Abdominal pain: No Abdominal distention/worsening  ascitesNo Fever/chills: No Episodes of confusion/disorientation: No, but wife reports he "has trouble with his memory" Number of daily bowel movements: every other day Taking diuretics?: No History of variceal bleeding: No Prior history of banding?: No Prior episodes of SBP: No Last time liver imaging was performed: As above Last AFP: none available MELD 3.0 score: Unable to calculate Currently consuming alcohol: No, quit many years ago - >30 years ago Hepatitis A and B vaccination status: never vaccinated  Last EGD: never Last Colonoscopy: 01/31/2023 - Three 3 to 8 mm polyps in the ascending colon, removed with a cold snare. Resected and retrieved. Clip was placed. Clip manufacturer: [employer]. - One 14 mm polyp in the ascending colon, removed with a hot snare. Resected and retrieved. Clip was placed. Clip manufacturer: [employer]. - Eight 3 to 10 mm polyps in the transverse colon, removed with a cold snare. Resected and retrieved. - Twelve 2 to 10 mm polyps in the rectum, in the sigmoid colon and in the descending colon, removed with a cold snare. Resected and retrieved. - Non- bleeding internal hemorrhoids.  Pathology showed 24 tubular adenomas  Recommend a repeat colonoscopy in 1 year  Past Medical History: Past Medical History:  Diagnosis Date   Allergy    Phreesia 03/01/2020   Arthritis    Phreesia 03/01/2020   Cataract    Phreesia 03/01/2020   Chest pain    Diagnostic cardiac cath October 2012. Normal coronary arteries, left ventricular function   COPD (chronic obstructive pulmonary disease) (HCC)    Phreesia 03/01/2020   DDD (degenerative disc disease), lumbar    Diabetes mellitus    Diabetes mellitus without complication (HCC)    Phreesia 03/01/2020   History of back surgery  History of neck surgery    HLD (hyperlipidemia) 12/09/2012   Hyperlipidemia    Phreesia 03/01/2020   Hypertension    Obesity    Right knee injury    Sleep apnea    Smoker      Past Surgical History: Past Surgical History:  Procedure Laterality Date   APPENDECTOMY     BACK SURGERY     for ruptured disc lumbar area   CARDIAC CATHETERIZATION  Oct 2012   nml coronary arteries, aorta, LV fcn   COLONOSCOPY WITH PROPOFOL N/A 01/31/2023   Procedure: COLONOSCOPY WITH PROPOFOL;  Surgeon: [name], MD;  [facility];  Service: Gastroenterology;  Laterality: N/A;  12:45pm;ASA 1-2   EYE SURGERY N/A    Phreesia 03/01/2020   HEMOSTASIS CLIP PLACEMENT  01/31/2023   Procedure: HEMOSTASIS CLIP PLACEMENT;  Surgeon: [name], MD;  [facility];  Service: Gastroenterology;;   I & D EXTREMITY Right 02/06/2013   Procedure: IRRIGATION AND DEBRIDEMENT INDEX FINGER;  Surgeon: Tami Ribas, MD;  Location: [facility];  Service: Orthopedics;  Laterality: Right;   NECK SURGERY     for ruptured disc   POLYPECTOMY  01/31/2023   Procedure: POLYPECTOMY;  Surgeon: [name], MD;  [facility];  Service: Gastroenterology;;   SPINE SURGERY N/A    Phreesia 03/01/2020   TONSILLECTOMY      Family History: Family History  Problem Relation Age of Onset   Dementia Mother    Heart disease Father     Social History: Social History   Tobacco Use  Smoking Status Every Day   Current packs/day: 2.00   Types: Cigarettes  Smokeless Tobacco Never  Tobacco Comments   Vaping more then cigs   Social History   Substance and Sexual Activity  Alcohol Use No   Social History   Substance and Sexual Activity  Drug Use No    Allergies: Allergies  Allergen Reactions   Lipitor [Atorvastatin Calcium] Other (See Comments)    shaking   Sulfa Antibiotics Other (See Comments)    unknown   Zocor [Simvastatin - High Dose] Other (See Comments)    shaking    Medications: Current Outpatient Medications  Medication Sig Dispense Refill   acetaminophen (TYLENOL) 500 MG tablet Take 1,000 mg by mouth every 8 (eight)  hours as needed for moderate pain.     Clobetasol Propionate 0.05 % lotion Apply 1 Application topically 2 (two) times daily as needed (psoriasis).     ezetimibe (ZETIA) 10 MG tablet TAKE 1 TABLET EVERY DAY 90 tablet 3   hydrOXYzine (ATARAX/VISTARIL) 25 MG tablet TAKE 1 TABLET (25 MG TOTAL) BY MOUTH 3 (THREE) TIMES DAILY AS NEEDED. FOR ITCHING (Patient not taking: Reported on 01/29/2023) 180 tablet 0   No current facility-administered medications for this visit.    Review of Systems: GENERAL: negative for malaise, night sweats HEENT: No changes in hearing or vision, no nose bleeds or other nasal problems. NECK: Negative for lumps, goiter, pain and significant neck swelling RESPIRATORY: Negative for cough, wheezing CARDIOVASCULAR: Negative for chest pain, leg swelling, palpitations, orthopnea GI: SEE HPI MUSCULOSKELETAL: Negative for joint pain or swelling, back pain, and muscle pain. SKIN: Negative for lesions, rash PSYCH: Negative for sleep disturbance, mood disorder and recent psychosocial stressors. HEMATOLOGY Negative for prolonged bleeding, bruising easily, and swollen nodes. ENDOCRINE: Negative for cold or heat intolerance, polyuria, polydipsia and goiter. NEURO: negative for tremor, gait imbalance, syncope and seizures. The remainder of  the review of systems is noncontributory.   Physical Exam: BP 131/76 (BP Location: Left Arm, Patient Position: Sitting, Cuff Size: Large)   Pulse 90   Temp (!) 97.1 F (36.2 C) (Temporal)   Ht 6' (1.829 m)   Wt 263 lb 8 oz (119.5 kg)   BMI 35.74 kg/m  GENERAL: The patient is AO x3, in no acute distress. HEENT: Head is normocephalic and atraumatic. EOMI are intact. Mouth is well hydrated and without lesions. NECK: Supple. No masses LUNGS: Clear to auscultation. No presence of rhonchi/wheezing/rales. Adequate chest expansion HEART: RRR, normal s1 and s2. ABDOMEN: Soft, nontender, no guarding, no peritoneal signs, and nondistended. BS +. No  masses. EXTREMITIES: Without any cyanosis, clubbing, rash, lesions or edema. NEUROLOGIC: AOx3, no focal motor deficit. SKIN: no jaundice, no rashes  Imaging/Labs: as above  I personally reviewed and interpreted the available labs, imaging and endoscopic files.  Impression and Plan: Ricky Rios is a 72 y.o. 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  The patient has presented compensated cirrhosis.  It seems that imaging is showing signs of portal hypertension, which is supported by his platelet count.  Due to this, we will need to proceed with an EGD to him for esophageal varices.  He is also due for [facility] screening, for which we will order ultrasound and AFP.  Will update MELD labs today, as well as we will check for immunity to hepatitis A and B.  Not presenting concerns of hepatic encephalopathy or ascites at the moment.  Patient is presenting some rectal bleeding especially when straining due to constipation.  I advised him to start taking lactulose which will be sent to his pharmacy.  Finally, he had presence of multiple colonic tubular adenomas, for which I will resend the referral for genetic counseling.  - Start lactulose 20 g daily, can increase to 2-3 times per day to achieve regular bowel movements and avoid straining - Schedule EGD - Check CBC, MELD labs and AFP - Referral to genetic counselor resent - Schedule liver doppler - Reduce salt intake to <2 g per day - Can take Tylenol max of 2 g per day (650 mg q8h) for pain - Avoid NSAIDs for pain - Avoid eating raw oysters/shellfish - Protein shake (Ensure or Boost) every night before going to sleep - Patient was counseled regarding the importance of stopping cigarette smoking. The patient was informed about the long term effects of smoking, progression of current disease.   All questions were answered.       [name], MD Gastroenterology and Hepatology [facility]

## 2023-05-10 NOTE — Telephone Encounter (Signed)
Left message for pt to return call. [place] scheduled for 05/21/23 at 8:30am. Pt to arrive at 8:15am;NPO 6-8 hours.  Need to schedule EGD ASA 3 Castaneda.

## 2023-05-11 ENCOUNTER — Telehealth (INDEPENDENT_AMBULATORY_CARE_PROVIDER_SITE_OTHER): Payer: Self-pay | Admitting: *Deleted

## 2023-05-11 NOTE — Telephone Encounter (Signed)
FYI -I called [facility]  and added hep a ab, total and hep b surface antibody to order patient did yesterday.

## 2023-05-11 NOTE — Telephone Encounter (Signed)
Thank you :)

## 2023-05-13 LAB — PROTIME-INR
INR: 1.1
Prothrombin Time: 11.3 s (ref 9.0–11.5)

## 2023-05-13 LAB — CBC WITH DIFFERENTIAL/PLATELET
Absolute Lymphocytes: 1529 {cells}/uL (ref 850–3900)
Absolute Monocytes: 348 {cells}/uL (ref 200–950)
Basophils Absolute: 68 {cells}/uL (ref 0–200)
Basophils Relative: 1.3 %
Eosinophils Absolute: 515 {cells}/uL — ABNORMAL HIGH (ref 15–500)
Eosinophils Relative: 9.9 %
HCT: 39.6 % (ref 38.5–50.0)
Hemoglobin: 13.1 g/dL — ABNORMAL LOW (ref 13.2–17.1)
MCH: 30 pg (ref 27.0–33.0)
MCHC: 33.1 g/dL (ref 32.0–36.0)
MCV: 90.6 fL (ref 80.0–100.0)
MPV: 10.4 fL (ref 7.5–12.5)
Monocytes Relative: 6.7 %
Neutro Abs: 2740 {cells}/uL (ref 1500–7800)
Neutrophils Relative %: 52.7 %
Platelets: 92 10*3/uL — ABNORMAL LOW (ref 140–400)
RBC: 4.37 10*6/uL (ref 4.20–5.80)
RDW: 13.7 % (ref 11.0–15.0)
Total Lymphocyte: 29.4 %
WBC: 5.2 10*3/uL (ref 3.8–10.8)

## 2023-05-13 LAB — COMPREHENSIVE METABOLIC PANEL
AG Ratio: 1.2 (calc) (ref 1.0–2.5)
ALT: 15 U/L (ref 9–46)
AST: 25 U/L (ref 10–35)
Albumin: 3.8 g/dL (ref 3.6–5.1)
Alkaline phosphatase (APISO): 91 U/L (ref 35–144)
BUN: 12 mg/dL (ref 7–25)
CO2: 24 mmol/L (ref 20–32)
Calcium: 9.1 mg/dL (ref 8.6–10.3)
Chloride: 108 mmol/L (ref 98–110)
Creat: 0.98 mg/dL (ref 0.70–1.28)
Globulin: 3.2 g/dL (ref 1.9–3.7)
Glucose, Bld: 120 mg/dL — ABNORMAL HIGH (ref 65–99)
Potassium: 3.9 mmol/L (ref 3.5–5.3)
Sodium: 141 mmol/L (ref 135–146)
Total Bilirubin: 0.7 mg/dL (ref 0.2–1.2)
Total Protein: 7 g/dL (ref 6.1–8.1)

## 2023-05-13 LAB — TEST AUTHORIZATION

## 2023-05-13 LAB — AFP TUMOR MARKER: AFP-Tumor Marker: 4.4 ng/mL (ref <6.1)

## 2023-05-13 LAB — HEPATITIS A ANTIBODY, TOTAL: Hepatitis A AB,Total: REACTIVE — AB

## 2023-05-13 LAB — HEPATITIS B SURFACE ANTIBODY,QUALITATIVE: Hep B S Ab: NONREACTIVE

## 2023-05-14 ENCOUNTER — Encounter (INDEPENDENT_AMBULATORY_CARE_PROVIDER_SITE_OTHER): Payer: Self-pay

## 2023-05-14 NOTE — Telephone Encounter (Signed)
Pt wife left multiple voicemail's returning call.  Returned call to pt wife. Wife states he has an ultrasound scheduled at [facility] on 05/24/23. Advised wife that we also have scheduled him for an ultrasound at [facility] on 05/21/23. Wife would like US done at [facility]. Advised wife she would need to call [facility] to cancel [place] for 05/24/23. Scheduled pt for EGD on 06/01/23 at 9:15am with Dr.Castaneda. Will mail instructions once I receive pre op.

## 2023-05-17 ENCOUNTER — Other Ambulatory Visit: Payer: Medicare HMO

## 2023-05-17 ENCOUNTER — Telehealth: Payer: Self-pay | Admitting: Genetic Counselor

## 2023-05-17 ENCOUNTER — Encounter: Payer: Medicare HMO | Admitting: Genetic Counselor

## 2023-05-17 NOTE — Telephone Encounter (Signed)
Called and left VM for patient to call back to schedule an appointment.

## 2023-05-21 ENCOUNTER — Ambulatory Visit (HOSPITAL_COMMUNITY)
Admission: RE | Admit: 2023-05-21 | Discharge: 2023-05-21 | Disposition: A | Discharge status: Home / Self Care | Payer: Medicare HMO | Source: Ambulatory Visit | Attending: Gastroenterology | Admitting: Gastroenterology

## 2023-05-21 DIAGNOSIS — K7689 Other specified diseases of liver: Secondary | ICD-10-CM | POA: Diagnosis not present

## 2023-05-21 DIAGNOSIS — K7581 Nonalcoholic steatohepatitis (NASH): Secondary | ICD-10-CM | POA: Insufficient documentation

## 2023-05-21 DIAGNOSIS — K769 Liver disease, unspecified: Secondary | ICD-10-CM | POA: Diagnosis not present

## 2023-05-21 DIAGNOSIS — K7460 Unspecified cirrhosis of liver: Secondary | ICD-10-CM | POA: Diagnosis not present

## 2023-05-24 ENCOUNTER — Other Ambulatory Visit: Payer: Medicare HMO

## 2023-05-28 DIAGNOSIS — K7460 Unspecified cirrhosis of liver: Secondary | ICD-10-CM | POA: Diagnosis not present

## 2023-05-28 DIAGNOSIS — R0981 Nasal congestion: Secondary | ICD-10-CM | POA: Diagnosis not present

## 2023-05-28 DIAGNOSIS — I350 Nonrheumatic aortic (valve) stenosis: Secondary | ICD-10-CM | POA: Diagnosis not present

## 2023-05-28 DIAGNOSIS — Z23 Encounter for immunization: Secondary | ICD-10-CM | POA: Diagnosis not present

## 2023-05-28 DIAGNOSIS — I519 Heart disease, unspecified: Secondary | ICD-10-CM | POA: Diagnosis not present

## 2023-05-28 DIAGNOSIS — J449 Chronic obstructive pulmonary disease, unspecified: Secondary | ICD-10-CM | POA: Diagnosis not present

## 2023-05-28 NOTE — Patient Instructions (Signed)
Ricky Rios  05/28/2023     @PREFPERIOPPHARMACY @   Your procedure is scheduled on  06/01/2023.   Report to [facility] at  0715 A.M.   Call this number if you have problems the morning of surgery:  [phone]  If you experience any cold or flu symptoms such as cough, fever, chills, shortness of breath, etc. between now and your scheduled surgery, please notify us at the above number.   Remember      Follow the diet instructions given to you by the office.    You may drink clear liquids until 0515 am on 06/01/2023.    Clear liquids allowed are:                    Water, Carbonated beverages (diabetics please choose diet or no sugar options), Black Coffee Only (No creamer, milk or cream, including half & half and powdered creamer), and Clear Sports drink (No red color; diabetics please choose diet or no sugar options)    Take these medicines the morning of surgery with A SIP OF WATER                                               None.    Do not wear jewelry, make-up or nail polish, including gel polish,  artificial nails, or any other type of covering on natural nails (fingers and  toes).  Do not wear lotions, powders, or perfumes, or deodorant.  Do not shave 48 hours prior to surgery.  Men may shave face and neck.  Do not bring valuables to the hospital.  [facility] is not responsible for any belongings or valuables.  Contacts, dentures or bridgework may not be worn into surgery.  Leave your suitcase in the car.  After surgery it may be brought to your room.  For patients admitted to the hospital, discharge time will be determined by your treatment team.  Patients discharged the day of surgery will not be allowed to drive home and must have someone with them for 24 hours.    Special instructions:   DO NOT smoke tobacco or vape for 24 hours before your procedure.  Please read over the following fact sheets that you were given. Anesthesia Post-op  Instructions and Care and Recovery After Surgery      Upper Endoscopy, Adult, Care After After the procedure, it is common to have a sore throat. It is also common to have: Mild stomach pain or discomfort. Bloating. Nausea. Follow these instructions at home: The instructions below may help you care for yourself at home. Your health care provider may give you more instructions. If you have questions, ask your health care provider. If you were given a sedative during the procedure, it can affect you for several hours. Do not drive or operate machinery until your health care provider says that it is safe. If you will be going home right after the procedure, plan to have a responsible adult: Take you home from the hospital or clinic. You will not be allowed to drive. Care for you for the time you are told. Follow instructions from your health care provider about what you may eat and drink. Return to your normal activities as told by your health care provider. Ask your health care provider what activities are safe for  you. Take over-the-counter and prescription medicines only as told by your health care provider. Contact a health care provider if you: Have a sore throat that lasts longer than one day. Have trouble swallowing. Have a fever. Get help right away if you: Vomit blood or your vomit looks like coffee grounds. Have bloody, black, or tarry stools. Have a very bad sore throat or you cannot swallow. Have difficulty breathing or very bad pain in your chest or abdomen. These symptoms may be an emergency. Get help right away. Call 911. Do not wait to see if the symptoms will go away. Do not drive yourself to the hospital. Summary After the procedure, it is common to have a sore throat, mild stomach discomfort, bloating, and nausea. If you were given a sedative during the procedure, it can affect you for several hours. Do not drive until your health care provider says that it is  safe. Follow instructions from your health care provider about what you may eat and drink. Return to your normal activities as told by your health care provider. This information is not intended to replace advice given to you by your health care provider. Make sure you discuss any questions you have with your health care provider. Document Revised: 10/19/2021 Document Reviewed: 10/19/2021 Elsevier Patient Education  2024 [employer] Monitored Anesthesia Care, Care After The following information offers guidance on how to care for yourself after your procedure. Your health care provider may also give you more specific instructions. If you have problems or questions, contact your health care provider. What can I expect after the procedure? After the procedure, it is common to have: Tiredness. Little or no memory about what happened during or after the procedure. Impaired judgment when it comes to making decisions. Nausea or vomiting. Some trouble with balance. Follow these instructions at home: For the time period you were told by your health care provider:  Rest. Do not participate in activities where you could fall or become injured. Do not drive or use machinery. Do not drink alcohol. Do not take sleeping pills or medicines that cause drowsiness. Do not make important decisions or sign legal documents. Do not take care of children on your own. Medicines Take over-the-counter and prescription medicines only as told by your health care provider. If you were prescribed antibiotics, take them as told by your health care provider. Do not stop using the antibiotic even if you start to feel better. Eating and drinking Follow instructions from your health care provider about what you may eat and drink. Drink enough fluid to keep your urine pale yellow. If you vomit: Drink clear fluids slowly and in small amounts as you are able. Clear fluids include water, ice chips, low-calorie sports  drinks, and fruit juice that has water added to it (diluted fruit juice). Eat light and bland foods in small amounts as you are able. These foods include bananas, applesauce, rice, lean meats, toast, and crackers. General instructions  Have a responsible adult stay with you for the time you are told. It is important to have someone help care for you until you are awake and alert. If you have sleep apnea, surgery and some medicines can increase your risk for breathing problems. Follow instructions from your health care provider about wearing your sleep device: When you are sleeping. This includes during daytime naps. While taking prescription pain medicines, sleeping medicines, or medicines that make you drowsy. Do not use any products that contain nicotine or tobacco. These products  include cigarettes, chewing tobacco, and vaping devices, such as e-cigarettes. If you need help quitting, ask your health care provider. Contact a health care provider if: You feel nauseous or vomit every time you eat or drink. You feel light-headed. You are still sleepy or having trouble with balance after 24 hours. You get a rash. You have a fever. You have redness or swelling around the IV site. Get help right away if: You have trouble breathing. You have new confusion after you get home. These symptoms may be an emergency. Get help right away. Call 911. Do not wait to see if the symptoms will go away. Do not drive yourself to the hospital. This information is not intended to replace advice given to you by your health care provider. Make sure you discuss any questions you have with your health care provider. Document Revised: 12/05/2021 Document Reviewed: 12/05/2021 Elsevier Patient Education  2024 [employer].

## 2023-05-29 ENCOUNTER — Encounter (HOSPITAL_COMMUNITY): Payer: Self-pay

## 2023-05-29 ENCOUNTER — Encounter (HOSPITAL_COMMUNITY)
Admission: RE | Admit: 2023-05-29 | Discharge: 2023-05-29 | Disposition: A | Discharge status: Home / Self Care | Payer: Medicare HMO | Source: Ambulatory Visit | Attending: Gastroenterology | Admitting: Gastroenterology

## 2023-05-29 VITALS — BP 131/76 | HR 90 | Temp 97.1°F | Resp 18 | Ht 72.0 in | Wt 263.5 lb

## 2023-05-29 DIAGNOSIS — I10 Essential (primary) hypertension: Secondary | ICD-10-CM | POA: Insufficient documentation

## 2023-05-29 DIAGNOSIS — E119 Type 2 diabetes mellitus without complications: Secondary | ICD-10-CM | POA: Insufficient documentation

## 2023-05-29 DIAGNOSIS — F17210 Nicotine dependence, cigarettes, uncomplicated: Secondary | ICD-10-CM | POA: Diagnosis not present

## 2023-05-29 DIAGNOSIS — Z01810 Encounter for preprocedural cardiovascular examination: Secondary | ICD-10-CM | POA: Diagnosis not present

## 2023-05-29 DIAGNOSIS — F17200 Nicotine dependence, unspecified, uncomplicated: Secondary | ICD-10-CM

## 2023-05-29 HISTORY — DX: Cardiac murmur, unspecified: R01.1

## 2023-06-01 ENCOUNTER — Ambulatory Visit (HOSPITAL_COMMUNITY)
Admission: RE | Admit: 2023-06-01 | Discharge: 2023-06-01 | Disposition: A | Discharge status: Home / Self Care | Payer: Medicare HMO | Attending: Gastroenterology | Admitting: Gastroenterology

## 2023-06-01 ENCOUNTER — Ambulatory Visit (HOSPITAL_COMMUNITY): Payer: Medicare HMO | Admitting: Anesthesiology

## 2023-06-01 ENCOUNTER — Encounter (HOSPITAL_COMMUNITY): Payer: Self-pay | Admitting: Gastroenterology

## 2023-06-01 ENCOUNTER — Other Ambulatory Visit: Payer: Self-pay

## 2023-06-01 ENCOUNTER — Encounter (HOSPITAL_COMMUNITY)
Admission: RE | Disposition: A | Discharge status: Home / Self Care | Payer: Self-pay | Source: Home / Self Care | Attending: Gastroenterology

## 2023-06-01 DIAGNOSIS — I864 Gastric varices: Secondary | ICD-10-CM | POA: Diagnosis not present

## 2023-06-01 DIAGNOSIS — D123 Benign neoplasm of transverse colon: Secondary | ICD-10-CM | POA: Insufficient documentation

## 2023-06-01 DIAGNOSIS — D128 Benign neoplasm of rectum: Secondary | ICD-10-CM | POA: Insufficient documentation

## 2023-06-01 DIAGNOSIS — D125 Benign neoplasm of sigmoid colon: Secondary | ICD-10-CM | POA: Diagnosis not present

## 2023-06-01 DIAGNOSIS — F17210 Nicotine dependence, cigarettes, uncomplicated: Secondary | ICD-10-CM | POA: Insufficient documentation

## 2023-06-01 DIAGNOSIS — I10 Essential (primary) hypertension: Secondary | ICD-10-CM | POA: Diagnosis not present

## 2023-06-01 DIAGNOSIS — J449 Chronic obstructive pulmonary disease, unspecified: Secondary | ICD-10-CM | POA: Diagnosis not present

## 2023-06-01 DIAGNOSIS — E119 Type 2 diabetes mellitus without complications: Secondary | ICD-10-CM | POA: Insufficient documentation

## 2023-06-01 DIAGNOSIS — K259 Gastric ulcer, unspecified as acute or chronic, without hemorrhage or perforation: Secondary | ICD-10-CM | POA: Diagnosis not present

## 2023-06-01 DIAGNOSIS — F17290 Nicotine dependence, other tobacco product, uncomplicated: Secondary | ICD-10-CM | POA: Diagnosis not present

## 2023-06-01 DIAGNOSIS — Z79899 Other long term (current) drug therapy: Secondary | ICD-10-CM | POA: Insufficient documentation

## 2023-06-01 DIAGNOSIS — G4730 Sleep apnea, unspecified: Secondary | ICD-10-CM | POA: Diagnosis not present

## 2023-06-01 DIAGNOSIS — I8510 Secondary esophageal varices without bleeding: Secondary | ICD-10-CM

## 2023-06-01 DIAGNOSIS — K7460 Unspecified cirrhosis of liver: Secondary | ICD-10-CM

## 2023-06-01 DIAGNOSIS — K7469 Other cirrhosis of liver: Secondary | ICD-10-CM | POA: Diagnosis not present

## 2023-06-01 DIAGNOSIS — F17200 Nicotine dependence, unspecified, uncomplicated: Secondary | ICD-10-CM | POA: Diagnosis not present

## 2023-06-01 DIAGNOSIS — K3189 Other diseases of stomach and duodenum: Secondary | ICD-10-CM

## 2023-06-01 DIAGNOSIS — K319 Disease of stomach and duodenum, unspecified: Secondary | ICD-10-CM | POA: Diagnosis not present

## 2023-06-01 DIAGNOSIS — D124 Benign neoplasm of descending colon: Secondary | ICD-10-CM | POA: Diagnosis not present

## 2023-06-01 DIAGNOSIS — K7581 Nonalcoholic steatohepatitis (NASH): Secondary | ICD-10-CM | POA: Diagnosis not present

## 2023-06-01 DIAGNOSIS — D122 Benign neoplasm of ascending colon: Secondary | ICD-10-CM | POA: Diagnosis not present

## 2023-06-01 DIAGNOSIS — K5900 Constipation, unspecified: Secondary | ICD-10-CM | POA: Diagnosis not present

## 2023-06-01 HISTORY — PX: BIOPSY: SHX5522

## 2023-06-01 HISTORY — PX: ESOPHAGOGASTRODUODENOSCOPY (EGD) WITH PROPOFOL: SHX5813

## 2023-06-01 SURGERY — ESOPHAGOGASTRODUODENOSCOPY (EGD) WITH PROPOFOL
Anesthesia: General

## 2023-06-01 MED ORDER — PROPOFOL 10 MG/ML IV BOLUS
INTRAVENOUS | Status: DC | PRN
  Administered 2023-06-01: 100 mg via INTRAVENOUS

## 2023-06-01 MED ORDER — SODIUM CHLORIDE 0.9% FLUSH: 10.0000 mL | Freq: Two times a day (BID) | INTRAVENOUS | Status: DC

## 2023-06-01 MED ORDER — LIDOCAINE HCL (CARDIAC) PF 100 MG/5ML IV SOSY
PREFILLED_SYRINGE | INTRAVENOUS | Status: DC | PRN
Start: 2023-06-01 — End: 2023-06-01
  Administered 2023-06-01: 80 mg via INTRAVENOUS

## 2023-06-01 MED ORDER — LACTATED RINGERS IV SOLN: INTRAVENOUS | Status: DC | PRN

## 2023-06-01 MED ORDER — OMEPRAZOLE 40 MG PO CPDR: 40.0000 mg | DELAYED_RELEASE_CAPSULE | Freq: Every day | ORAL | 3 refills | Status: DC

## 2023-06-01 MED ORDER — CARVEDILOL 3.125 MG PO TABS: 3.1250 mg | ORAL_TABLET | Freq: Two times a day (BID) | ORAL | 3 refills | Status: DC

## 2023-06-01 NOTE — Anesthesia Postprocedure Evaluation (Signed)
Anesthesia Post Note  Patient: Ricky Rios  Procedure(s) Performed: ESOPHAGOGASTRODUODENOSCOPY (EGD) WITH PROPOFOL BIOPSY  Patient location during evaluation: Phase II Anesthesia Type: General Level of consciousness: awake Pain management: pain level controlled Vital Signs Assessment: post-procedure vital signs reviewed and stable Respiratory status: spontaneous breathing and respiratory function stable Cardiovascular status: blood pressure returned to baseline and stable Postop Assessment: no headache and no apparent nausea or vomiting Anesthetic complications: no Comments: Late entry   No notable events documented.   Last Vitals:  Vitals:   06/01/23 0922 06/01/23 1103  BP: (!) 124/90 (!) 97/43  Pulse: 77 67  Resp: 17 16  Temp: 36.8 C 36.6 C  SpO2: 97% 100%    Last Pain:  Vitals:   06/01/23 1103  TempSrc: Axillary  PainSc: 0-No pain                 Windell Norfolk

## 2023-06-01 NOTE — Anesthesia Preprocedure Evaluation (Signed)
Anesthesia Evaluation  Patient identified by MRN, date of birth, ID band Patient awake    Reviewed: Allergy & Precautions, H&P , NPO status , Patient's Chart, lab work & pertinent test results, reviewed documented beta blocker date and time   Airway Mallampati: II  TM Distance: >3 FB Neck ROM: full    Dental no notable dental hx.    Pulmonary neg pulmonary ROS, sleep apnea , COPD, Current Smoker and Patient abstained from smoking.   Pulmonary exam normal breath sounds clear to auscultation       Cardiovascular Exercise Tolerance: Good hypertension, negative cardio ROS + Valvular Problems/Murmurs  Rhythm:regular Rate:Normal     Neuro/Psych negative neurological ROS  negative psych ROS   GI/Hepatic negative GI ROS, Neg liver ROS,,,(+) Hepatitis -  Endo/Other  negative endocrine ROSdiabetes    Renal/GU negative Renal ROS  negative genitourinary   Musculoskeletal   Abdominal   Peds  Hematology negative hematology ROS (+)   Anesthesia Other Findings   Reproductive/Obstetrics negative OB ROS                             Anesthesia Physical Anesthesia Plan  ASA: 3  Anesthesia Plan: General   Post-op Pain Management:    Induction:   PONV Risk Score and Plan: Propofol infusion  Airway Management Planned:   Additional Equipment:   Intra-op Plan:   Post-operative Plan:   Informed Consent: I have reviewed the patients History and Physical, chart, labs and discussed the procedure including the risks, benefits and alternatives for the proposed anesthesia with the patient or authorized representative who has indicated his/her understanding and acceptance.     Dental Advisory Given  Plan Discussed with: CRNA  Anesthesia Plan Comments:        Anesthesia Quick Evaluation

## 2023-06-01 NOTE — Transfer of Care (Addendum)
Immediate Anesthesia Transfer of Care Note  Patient: Ricky Rios  Procedure(s) Performed: ESOPHAGOGASTRODUODENOSCOPY (EGD) WITH PROPOFOL BIOPSY  Patient Location: PACU  Anesthesia Type:General  Level of Consciousness: awake, alert , oriented, and patient cooperative  Airway & Oxygen Therapy: Patient Spontanous Breathing and Patient connected to nasal cannula oxygen  Post-op Assessment: Report given to RN, Post -op Vital signs reviewed and stable, and Patient moving all extremities X 4  Post vital signs: Reviewed and stable  Last Vitals: VSS, and to be entered by PACU RN. Vitals Value Taken Time  BP 97/43   Temp 36.6   Pulse 67   Resp 16   SpO2 100%     Last Pain:  Vitals:   06/01/23 1040  TempSrc:   PainSc: 0-No pain      Patients Stated Pain Goal: 6 (06/01/23 8413)  Complications: No notable events documented.

## 2023-06-01 NOTE — Interval H&P Note (Signed)
History and Physical Interval Note:  06/01/2023 10:30 AM  Ricky Rios  has presented today for surgery, with the diagnosis of ESOPHAGEAL VARICES.  The various methods of treatment have been discussed with the patient and family. After consideration of risks, benefits and other options for treatment, the patient has consented to  Procedure(s) with comments: ESOPHAGOGASTRODUODENOSCOPY (EGD) WITH PROPOFOL (N/A) - 9:15AM;ASA 3 as a surgical intervention.  The patient's history has been reviewed, patient examined, no change in status, stable for surgery.  I have reviewed the patient's chart and labs.  Questions were answered to the patient's satisfaction.     [name]

## 2023-06-01 NOTE — Discharge Instructions (Signed)
You are being discharged to home.  Resume your previous diet.  We are waiting for your pathology results.  Take Prilosec (omeprazole) 40 mg by mouth once a day.  Your physician has recommended a repeat upper endoscopy in three months for surveillance.   Start carvedilol 3.125 mg twice a day. Will discuss referral for TIPS placement.

## 2023-06-01 NOTE — Op Note (Signed)
[facility] Patient Name: Ricky Rios Procedure Date: 06/01/2023 10:34 AM MRN: [id number] Date of Birth: 1951/04/16 Attending MD: [name] , , [id number] CSN: [id number] Age: 72 Admit Type: Outpatient Procedure:                Upper GI endoscopy Indications:              Cirrhosis rule out esophageal varices Providers:                [name], Francoise Ceo RN, RN, Dyann Ruddle Referring MD:              Medicines:                Monitored Anesthesia Care Complications:            No immediate complications. Estimated Blood Loss:     Estimated blood loss: none. Procedure:                Pre-Anesthesia Assessment:                           - Prior to the procedure, a History and Physical                            was performed, and patient medications, allergies                            and sensitivities were reviewed. The patient's                            tolerance of previous anesthesia was reviewed.                           - The risks and benefits of the procedure and the                            sedation options and risks were discussed with the                            patient. All questions were answered and informed                            consent was obtained.                           - ASA Grade Assessment: III - A patient with severe                            systemic disease.                           After obtaining informed consent, the endoscope was                            passed under direct vision. Throughout the                            procedure,  the patient's blood pressure, pulse, and                            oxygen saturations were monitored continuously. The                            [id number]-H190 ([id number]) scope was introduced through the                            mouth, and advanced to the second part of duodenum.                            The upper GI endoscopy was accomplished without                             difficulty. The patient tolerated the procedure                            well. Scope In: 10:45:36 AM Scope Out: 10:53:21 AM Total Procedure Duration: 0 hours 7 minutes 45 seconds  Findings:      One column of grade II varices with no stigmata of recent bleeding were       found in the lower third of the esophagus. No red wale signs were       present.      Type 2 gastroesophageal varices (GOV2, esophageal varices which extend       along the fundus) with no bleeding were found in the gastric fundus.      Four non-bleeding cratered gastric ulcers with a clean ulcer base       (Forrest Class III) were found in the gastric antrum. The largest lesion       was 5 mm in largest dimension. Biopsies were taken with a cold forceps       for Helicobacter pylori testing. Biopsies were taken from edges of ulcer       with a cold forceps for histology.      The examined duodenum was normal. Impression:               - Grade II esophageal varices with no stigmata of                            recent bleeding.                           - Type 2 gastroesophageal varices (GOV2, esophageal                            varices which extend along the fundus), without                            bleeding.                           - Non-bleeding gastric ulcers with a clean ulcer  base (Forrest Class III). Biopsied.                           - Normal examined duodenum. Moderate Sedation:      Per Anesthesia Care Recommendation:           - Discharge patient to home (ambulatory).                           - Resume previous diet.                           - Await pathology results.                           - Start carvedilol 3.125 mg twice a day.                           - Use Prilosec (omeprazole) 40 mg PO daily.                           - Repeat upper endoscopy in 3 months for                            surveillance.                           -Will discuss referral for TIPS  placement. Procedure Code(s):        --- Professional ---                           [phone], Esophagogastroduodenoscopy, flexible,                            transoral; with biopsy, single or multiple Diagnosis Code(s):        --- Professional ---                           K74.60, Unspecified cirrhosis of liver                           I85.10, Secondary esophageal varices without                            bleeding                           I86.4, Gastric varices                           K25.9, Gastric ulcer, unspecified as acute or                            chronic, without hemorrhage or perforation CPT copyright 2022 American Medical Association. All rights reserved. The codes documented in this report are preliminary and upon coder review may  be revised to meet current compliance requirements. [name], MD [name],  06/01/2023 11:03:53 AM This report has been signed electronically.  Number of Addenda: 0

## 2023-06-04 ENCOUNTER — Telehealth (INDEPENDENT_AMBULATORY_CARE_PROVIDER_SITE_OTHER): Payer: Self-pay | Admitting: *Deleted

## 2023-06-04 LAB — SURGICAL PATHOLOGY

## 2023-06-04 NOTE — Telephone Encounter (Signed)
-----   Message from [name] sent at 06/01/2023 11:12 AM EST ----- Casandra Doffing, can you please refer the patient to IR for TIPS? Dx: gastric and esophageal varices  Thanks,   [name], MD Gastroenterology and Hepatology [facility]

## 2023-06-04 NOTE — Telephone Encounter (Signed)
Referral sent, they will contact patient with apt

## 2023-06-05 ENCOUNTER — Encounter (INDEPENDENT_AMBULATORY_CARE_PROVIDER_SITE_OTHER): Payer: Self-pay

## 2023-06-06 NOTE — Telephone Encounter (Signed)
6 mth US noted in recall 

## 2023-06-07 ENCOUNTER — Encounter (HOSPITAL_COMMUNITY): Payer: Self-pay | Admitting: Gastroenterology

## 2023-06-07 ENCOUNTER — Encounter (INDEPENDENT_AMBULATORY_CARE_PROVIDER_SITE_OTHER): Payer: Self-pay | Admitting: *Deleted

## 2023-06-13 ENCOUNTER — Other Ambulatory Visit: Payer: Self-pay | Admitting: Gastroenterology

## 2023-06-13 DIAGNOSIS — I8500 Esophageal varices without bleeding: Secondary | ICD-10-CM

## 2023-06-14 ENCOUNTER — Telehealth: Payer: Self-pay

## 2023-06-14 NOTE — Patient Outreach (Signed)
Attempted to contact patient regarding care gaps. Left voicemail for patient to return my call at [phone].  Nicholes Rough, CMA Care Guide [employer]

## 2023-06-15 NOTE — Progress Notes (Signed)
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 male with a medical history significant for DM, COPD, HTN, sleep apnea, colonic polyps and NASH cirrhosis. The patient is also a smoker and had a lung cancer screening CT in July 2024. This showed cirrhosis with splenomegaly and vascular collateralization in the left abdomen. His GI doctor brought him in for an EDG 06/01/23 which revealed Grade II esophageal and gastric varices with no stigmata of recent bleeding. During a clinic visit the patient's wife reported that the patient was having trouble with his memory and he was started on lactulose.   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 He presents today alongside his wife.  He denies any prior episodes of hematemesis.  He does have occasional melena, however confounded by known hemorrhoids.   Past Medical History:  Diagnosis Date   Allergy    Phreesia 03/01/2020   Arthritis    Phreesia 03/01/2020   Cataract    Phreesia 03/01/2020   Chest pain    Diagnostic cardiac cath October 2012. Normal coronary arteries, left ventricular function   COPD (chronic obstructive pulmonary disease) (HCC)    Phreesia 03/01/2020   DDD (degenerative disc disease), lumbar    Diabetes mellitus    Diabetes mellitus without complication (HCC)    Phreesia 03/01/2020   Heart murmur    History of back surgery    History of neck surgery    HLD (hyperlipidemia) 12/09/2012   Hyperlipidemia    Phreesia 03/01/2020   Hypertension    Obesity    Right knee injury    Sleep apnea    Smoker     Past Surgical History:  Procedure Laterality Date   APPENDECTOMY     BACK SURGERY     for ruptured disc lumbar area   BIOPSY  06/01/2023    Procedure: BIOPSY;  Surgeon: [name], MD;  [facility]: [facility];  Service: Gastroenterology;;   CARDIAC CATHETERIZATION  Oct 2012   nml coronary arteries, aorta, LV fcn   COLONOSCOPY WITH PROPOFOL N/A 01/31/2023   Procedure: COLONOSCOPY WITH PROPOFOL;  Surgeon: [name], MD;  Location: [facility];  Service: Gastroenterology;  Laterality: N/A;  12:45pm;ASA 1-2   ESOPHAGOGASTRODUODENOSCOPY (EGD) WITH PROPOFOL N/A 06/01/2023   Procedure: ESOPHAGOGASTRODUODENOSCOPY (EGD) WITH PROPOFOL;  Surgeon: [name], MD;  [facility]: [facility];  Service: Gastroenterology;  Laterality: N/A;  9:15AM;ASA 3   EYE SURGERY N/A    Phreesia 03/01/2020   HEMOSTASIS CLIP PLACEMENT  01/31/2023   Procedure: HEMOSTASIS CLIP PLACEMENT;  Surgeon: [name], MD;  [facility]: [facility];  Service: Gastroenterology;;   I & D EXTREMITY Right 02/06/2013   Procedure: IRRIGATION AND DEBRIDEMENT INDEX FINGER;  Surgeon: Tami Ribas, MD;  Location: [facility];  Service: Orthopedics;  Laterality: Right;   NECK SURGERY     for ruptured disc   POLYPECTOMY  01/31/2023   Procedure: POLYPECTOMY;  Surgeon: [name], MD;  Location: [facility];  Service: Gastroenterology;;   SPINE SURGERY N/A    Phreesia 03/01/2020   TONSILLECTOMY      Allergies: Lipitor [atorvastatin calcium], Sulfa antibiotics, and Zocor [simvastatin - high dose]  Medications: Prior to Admission medications   Medication Sig Start Date End Date Taking? Authorizing  Provider  acetaminophen (TYLENOL) 500 MG tablet Take 1,000 mg by mouth every 8 (eight) hours as needed for moderate pain.    [provider]  albuterol (VENTOLIN HFA) 108 (90 Base) MCG/ACT inhaler Inhale into the lungs every 6 (six) hours as needed for wheezing or shortness of breath.    [provider]  carvedilol (COREG) 3.125 MG tablet Take 1 tablet (3.125 mg total) by mouth 2 (two) times daily  with a meal. 06/01/23   Marguerita Merles, Reuel Boom, MD  Clobetasol Propionate 0.05 % lotion Apply 1 Application topically 2 (two) times daily as needed (psoriasis). 11/24/22   [provider]  ezetimibe (ZETIA) 10 MG tablet TAKE 1 TABLET EVERY DAY 08/30/21   Donita Brooks, MD  hydrOXYzine (ATARAX/VISTARIL) 25 MG tablet TAKE 1 TABLET (25 MG TOTAL) BY MOUTH 3 (THREE) TIMES DAILY AS NEEDED. FOR ITCHING Patient not taking: Reported on 01/29/2023 10/06/20   Donita Brooks, MD  lactulose (CHRONULAC) 10 GM/15ML solution Take 30 mLs (20 g total) by mouth 2 (two) times daily. 05/10/23   [name], MD  omeprazole (PRILOSEC) 40 MG capsule Take 1 capsule (40 mg total) by mouth daily. 06/01/23   [name], MD     Family History  Problem Relation Age of Onset   Dementia Mother    Heart disease Father     Social History   Socioeconomic History   Marital status: Married    Spouse name: Fleet Contras   Number of children: 2   Years of education: Not on file   Highest education level: Not on file  Occupational History   Occupation: Disabled    Employer: Naval architect  Tobacco Use   Smoking status: Every Day    Current packs/day: 2.00    Types: Cigarettes   Smokeless tobacco: Never   Tobacco comments:    Vaping more then cigs  Vaping Use   Vaping status: Some Days  Substance and Sexual Activity   Alcohol use: No   Drug use: No   Sexual activity: Not on file  Other Topics Concern   Not on file  Social History Narrative   1 step-son. 1 daughter.    4 grandchildren.   Social Determinants of Health   Financial Resource Strain: Medium Risk (06/02/2021)   Overall Financial Resource Strain (CARDIA)    Difficulty of Paying Living Expenses: Somewhat hard  Food Insecurity: Food Insecurity Present (06/02/2021)   Hunger Vital Sign    Worried About Running Out of Food in the Last Year: Sometimes true    Ran Out of Food in the Last Year: Sometimes true   Transportation Needs: No Transportation Needs (06/02/2021)   PRAPARE - Administrator, Civil Service (Medical): No    Lack of Transportation (Non-Medical): No  Physical Activity: Insufficiently Active (06/02/2021)   Exercise Vital Sign    Days of Exercise per Week: 2 days    Minutes of Exercise per Session: 10 min  Stress: Stress Concern Present (06/02/2021)   [employer] of Occupational Health - Occupational Stress Questionnaire    Feeling of Stress : To some extent  Social Connections: Socially Integrated (06/02/2021)   Social Connection and Isolation Panel [NHANES]    Frequency of Communication with Friends and Family: More than three times a week    Frequency of Social Gatherings with Friends and Family: Once a week    Attends Religious Services: 1 to 4 times per year    Active Member of  Clubs or Organizations: Yes    Attends Banker Meetings: 1 to 4 times per year    Marital Status: Married     Review of Systems: A 12 point ROS discussed and pertinent positives are indicated in the HPI above.  All other systems are negative.   Vital Signs: There were no vitals taken for this visit.  Advance Care Plan: The advanced care plan/surrogate decision maker was discussed at the time of visit and documented in the medical record.    Physical Exam Constitutional:      General: He is not in acute distress. HENT:     Head: Normocephalic.     Mouth/Throat:     Mouth: Mucous membranes are moist.  Eyes:     General: No scleral icterus. Cardiovascular:     Rate and Rhythm: Normal rate.  Pulmonary:     Effort: Pulmonary effort is normal. No respiratory distress.  Abdominal:     General: There is no distension.  Musculoskeletal:     Right lower leg: No edema.     Left lower leg: No edema.  Skin:    General: Skin is warm and dry.     Coloration: Skin is not jaundiced.  Neurological:     Mental Status: He is alert and oriented to person, place, and  time.     Imaging: CT Chest 02/16/23   Cirrhosis, splenomegaly, suggestion of large gastric varices with gastrorenal shunt.  US Hepatic Doppler (05/21/23) IMPRESSION: Directed duplex of the hepatic vasculature is unremarkable   Labs:  CBC: Recent Labs    05/10/23 1028  WBC 5.2  HGB 13.1*  HCT 39.6  PLT 92*    COAGS: Recent Labs    05/10/23 1028  INR 1.1    BMP: Recent Labs    05/10/23 1028  NA 141  K 3.9  CL 108  CO2 24  GLUCOSE 120*  BUN 12  CALCIUM 9.1  CREATININE 0.98    LIVER FUNCTION TESTS: Recent Labs    05/10/23 1028  BILITOT 0.7  AST 25  ALT 15  PROT 7.0    TUMOR MARKERS: Recent Labs    05/10/23 1028  AFPTM 4.4    Child Pugh A(6) MELD 7 (1.9% estimated 3 month mortality) FIPS -0.50 (overall survival after TIPS estimated at 1 month 97.2%, 3 months 90.9%, and 6 months 86.5%)   EGD 06/01/23 -grade II esophageal varices -type II gastroesophageal varices extending along the fundus -non-bleeding gastric ulcers   Assessment and Plan: 72 year old male with a history of compensated MASH cirrhosis (CP A6, MELD 7) with portal hypertension and gastroesophageal varices with 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  No prior episodes of hematemesis or significant gastrointestinal hemorrhage.    -CTA abdomen pelvis BRTO protocol -Echocardiogram -consideration of non-selective beta blocker -follow up in 1 month to review studies and determine candidacy for intervention  Marliss Coots, MD Pager: [phone] Clinic: [phone]    I spent a total of  30 Minutes   in face to face in clinical consultation, greater than 50% of which was counseling/coordinating care for portal hypertension.

## 2023-06-18 ENCOUNTER — Telehealth: Payer: Self-pay | Admitting: Licensed Clinical Social Worker

## 2023-06-18 NOTE — Telephone Encounter (Signed)
Patient called to ask questions about upcoming genetic counseling visit. I explained purpose for visit and briefly explained genetic testing. Patient states he has a lot going on right now and would prefer to wait until next year after colonoscopy to do a genetics visit. Cancelled appointment.   Lacy Duverney, MS, [facility] Genetic Counselor [email or web address] Phone: [phone]

## 2023-06-19 ENCOUNTER — Inpatient Hospital Stay: Payer: Medicare HMO | Admitting: Genetic Counselor

## 2023-06-19 ENCOUNTER — Inpatient Hospital Stay: Payer: Medicare HMO

## 2023-06-20 ENCOUNTER — Other Ambulatory Visit: Payer: Self-pay | Admitting: Interventional Radiology

## 2023-06-20 ENCOUNTER — Ambulatory Visit
Admission: RE | Admit: 2023-06-20 | Discharge: 2023-06-20 | Disposition: A | Discharge status: Home / Self Care | Payer: Medicare HMO | Source: Ambulatory Visit | Attending: Gastroenterology | Admitting: Gastroenterology

## 2023-06-20 DIAGNOSIS — I864 Gastric varices: Secondary | ICD-10-CM | POA: Diagnosis not present

## 2023-06-20 DIAGNOSIS — I8500 Esophageal varices without bleeding: Secondary | ICD-10-CM

## 2023-06-20 DIAGNOSIS — I8510 Secondary esophageal varices without bleeding: Secondary | ICD-10-CM

## 2023-06-20 DIAGNOSIS — K766 Portal hypertension: Secondary | ICD-10-CM | POA: Diagnosis not present

## 2023-06-20 DIAGNOSIS — K7460 Unspecified cirrhosis of liver: Secondary | ICD-10-CM

## 2023-06-20 HISTORY — PX: IR RADIOLOGIST EVAL & MGMT: IMG5224

## 2023-06-27 DIAGNOSIS — Z23 Encounter for immunization: Secondary | ICD-10-CM | POA: Diagnosis not present

## 2023-07-02 ENCOUNTER — Other Ambulatory Visit: Payer: Self-pay | Admitting: Interventional Radiology

## 2023-07-02 DIAGNOSIS — K766 Portal hypertension: Secondary | ICD-10-CM

## 2023-07-02 DIAGNOSIS — K7460 Unspecified cirrhosis of liver: Secondary | ICD-10-CM

## 2023-07-05 ENCOUNTER — Inpatient Hospital Stay: Payer: Medicare HMO | Admitting: Licensed Clinical Social Worker

## 2023-07-06 ENCOUNTER — Other Ambulatory Visit: Payer: Self-pay | Admitting: Interventional Radiology

## 2023-07-06 DIAGNOSIS — K7460 Unspecified cirrhosis of liver: Secondary | ICD-10-CM

## 2023-07-06 DIAGNOSIS — I8510 Secondary esophageal varices without bleeding: Secondary | ICD-10-CM

## 2023-07-06 DIAGNOSIS — K766 Portal hypertension: Secondary | ICD-10-CM

## 2023-07-12 ENCOUNTER — Ambulatory Visit (HOSPITAL_COMMUNITY)
Admission: RE | Admit: 2023-07-12 | Discharge: 2023-07-12 | Disposition: A | Discharge status: Home / Self Care | Payer: Medicare HMO | Source: Ambulatory Visit | Attending: Interventional Radiology | Admitting: Interventional Radiology

## 2023-07-12 DIAGNOSIS — R161 Splenomegaly, not elsewhere classified: Secondary | ICD-10-CM | POA: Diagnosis not present

## 2023-07-12 DIAGNOSIS — K766 Portal hypertension: Secondary | ICD-10-CM | POA: Insufficient documentation

## 2023-07-12 DIAGNOSIS — I8510 Secondary esophageal varices without bleeding: Secondary | ICD-10-CM | POA: Diagnosis not present

## 2023-07-12 DIAGNOSIS — I8500 Esophageal varices without bleeding: Secondary | ICD-10-CM | POA: Diagnosis not present

## 2023-07-12 DIAGNOSIS — K7581 Nonalcoholic steatohepatitis (NASH): Secondary | ICD-10-CM | POA: Insufficient documentation

## 2023-07-12 DIAGNOSIS — K7460 Unspecified cirrhosis of liver: Secondary | ICD-10-CM | POA: Insufficient documentation

## 2023-07-12 LAB — POCT I-STAT CREATININE: Creatinine, Ser: 1.2 mg/dL (ref 0.61–1.24)

## 2023-07-12 MED ORDER — IOHEXOL 300 MG/ML  SOLN
100.0000 mL | Freq: Once | INTRAMUSCULAR | Status: AC | PRN
  Administered 2023-07-12: 100 mL via INTRAVENOUS

## 2023-07-12 MED ORDER — SODIUM CHLORIDE (PF) 0.9 % IJ SOLN
INTRAMUSCULAR | Status: AC
  Filled 2023-07-12: qty 50

## 2023-07-16 ENCOUNTER — Ambulatory Visit (HOSPITAL_COMMUNITY): Payer: Medicare HMO | Attending: Cardiovascular Disease | Admitting: Cardiovascular Disease

## 2023-07-16 ENCOUNTER — Ambulatory Visit (HOSPITAL_COMMUNITY)
Admission: RE | Admit: 2023-07-16 | Discharge: 2023-07-16 | Disposition: A | Discharge status: Home / Self Care | Payer: Medicare HMO | Source: Ambulatory Visit | Attending: Interventional Radiology

## 2023-07-16 ENCOUNTER — Encounter: Payer: Self-pay | Admitting: Cardiovascular Disease

## 2023-07-16 VITALS — BP 136/74 | HR 61 | Ht 72.0 in | Wt 259.4 lb

## 2023-07-16 DIAGNOSIS — I350 Nonrheumatic aortic (valve) stenosis: Secondary | ICD-10-CM | POA: Insufficient documentation

## 2023-07-16 DIAGNOSIS — E785 Hyperlipidemia, unspecified: Secondary | ICD-10-CM

## 2023-07-16 DIAGNOSIS — I10 Essential (primary) hypertension: Secondary | ICD-10-CM | POA: Diagnosis not present

## 2023-07-16 DIAGNOSIS — E119 Type 2 diabetes mellitus without complications: Secondary | ICD-10-CM | POA: Insufficient documentation

## 2023-07-16 DIAGNOSIS — Z01810 Encounter for preprocedural cardiovascular examination: Secondary | ICD-10-CM | POA: Insufficient documentation

## 2023-07-16 DIAGNOSIS — K7460 Unspecified cirrhosis of liver: Secondary | ICD-10-CM

## 2023-07-16 DIAGNOSIS — R0989 Other specified symptoms and signs involving the circulatory and respiratory systems: Secondary | ICD-10-CM | POA: Diagnosis not present

## 2023-07-16 DIAGNOSIS — K766 Portal hypertension: Secondary | ICD-10-CM

## 2023-07-16 DIAGNOSIS — F17200 Nicotine dependence, unspecified, uncomplicated: Secondary | ICD-10-CM | POA: Diagnosis not present

## 2023-07-16 LAB — ECHOCARDIOGRAM COMPLETE
AR max vel: 1.63 cm2
AV Area VTI: 1.54 cm2
AV Area mean vel: 1.54 cm2
AV Mean grad: 18.8 mm[Hg]
AV Peak grad: 34 mm[Hg]
Ao pk vel: 2.92 m/s
Area-P 1/2: 3.31 cm2
Calc EF: 60.9 %
S' Lateral: 3.3 cm
Single Plane A2C EF: 62.4 %
Single Plane A4C EF: 57.9 %

## 2023-07-16 NOTE — Assessment & Plan Note (Signed)
History of hyperlipidemia on statin therapy with lipid profile performed 11/24/2022 revealing total cholesterol 147, LDL 72 and HDL 33.

## 2023-07-16 NOTE — Assessment & Plan Note (Signed)
Patient has a high-pitched outflow tract murmur consistent with aortic stenosis.  2D echo performed earlier today revealed normal LV systolic function, grade 1 diastolic dysfunction with mild aortic stenosis.  His aortic valve area measured 1.54 cm with a peak gradient of 34 mmHg.  Will continue to follow this on annual basis.

## 2023-07-16 NOTE — Assessment & Plan Note (Signed)
History of essential hypertension her blood pressure measured today at 136/74.  He is on low-dose carvedilol.

## 2023-07-16 NOTE — Progress Notes (Signed)
  Echocardiogram 2D Echocardiogram has been performed.  Janalyn Harder 07/16/2023, 8:49 AM

## 2023-07-16 NOTE — Patient Instructions (Signed)
Medication Instructions:  Your physician recommends that you continue on your current medications as directed. Please refer to the Current Medication list given to you today.  *If you need a refill on your cardiac medications before your next appointment, please call your pharmacy*   Testing/Procedures: Your physician has requested that you have a carotid duplex. This test is an ultrasound of the carotid arteries in your neck. It looks at blood flow through these arteries that supply the brain with blood. Allow one hour for this exam. There are no restrictions or special instructions. This will take place at [address] [facility], [address].  Please note: We ask at that you not bring children with you during ultrasound (echo/ vascular) testing. Due to room size and safety concerns, children are not allowed in the ultrasound rooms during exams. Our front office staff cannot provide observation of children in our lobby area while testing is being conducted. An adult accompanying a patient to their appointment will only be allowed in the ultrasound room at the discretion of the ultrasound technician under special circumstances. We apologize for any inconvenience.   Your physician has requested that you have an echocardiogram. Echocardiography is a painless test that uses sound waves to create images of your heart. It provides your doctor with information about the size and shape of your heart and how well your heart's chambers and valves are working. This procedure takes approximately one hour. There are no restrictions for this procedure. Please do NOT wear cologne, perfume, aftershave, or lotions (deodorant is allowed). Please arrive 15 minutes prior to your appointment time. **To do in December 2025**  Please note: We ask at that you not bring children with you during ultrasound (echo/ vascular) testing. Due to room size and safety concerns, children are not allowed in the ultrasound rooms during exams.  Our front office staff cannot provide observation of children in our lobby area while testing is being conducted. An adult accompanying a patient to their appointment will only be allowed in the ultrasound room at the discretion of the ultrasound technician under special circumstances. We apologize for any inconvenience.    Follow-Up: At [facility], you and your health needs are our priority.  As part of our continuing mission to provide you with exceptional heart care, we have created designated Provider Care Teams.  These Care Teams include your primary Cardiologist (physician) and Advanced Practice Providers (APPs -  Physician Assistants and Nurse Practitioners) who all work together to provide you with the care you need, when you need it.  We recommend signing up for the patient portal called "MyChart".  Sign up information is provided on this After Visit Summary.  MyChart is used to connect with patients for Virtual Visits (Telemedicine).  Patients are able to view lab/test results, encounter notes, upcoming appointments, etc.  Non-urgent messages can be sent to your provider as well.   To learn more about what you can do with MyChart, go to [email or web address].    Your next appointment:   12 month(s)  Provider:   Nanetta Batty, MD

## 2023-07-16 NOTE — Assessment & Plan Note (Signed)
Ongoing tobacco use of 1 pack/day for the last 50 years plus.  He does have a history of COPD.

## 2023-07-16 NOTE — Progress Notes (Signed)
07/16/2023 Ricky Rios   13-Apr-1951  [id number]  Primary Physician Irven Coe, MD Primary Cardiologist: Runell Gess MD Milagros Loll, [place], [place]  HPI:  Ricky Rios is a 72 y.o. moderately overweight married Caucasian male father of 5 children (2 biologic), grandfather to 48 grandchildren who is accompanied by his wife Fleet Contras today.  He is retired from being in drug delivery.  He was referred by his PCP, Dr. Irven Coe, because of an auscultated murmur.  History factors include over 50 pack years of tobacco abuse currently smoking 1 pack/day with a diagnosis of COPD and dyspnea.  He has treated hypertension and hyperlipidemia.  He did have type 2 diabetes in the past his father had bypass surgery.  He is never had a heart attack or stroke.  I did a heart catheterization on him 05/04/2011 for chest pain revealing normal coronary arteries and normal LV function as well as a normal abdominal aorta.   Current Meds  Medication Sig   acetaminophen (TYLENOL) 500 MG tablet Take 1,000 mg by mouth every 8 (eight) hours as needed for moderate pain.   carvedilol (COREG) 3.125 MG tablet Take 1 tablet (3.125 mg total) by mouth 2 (two) times daily with a meal.   Clobetasol Propionate 0.05 % lotion Apply 1 Application topically 2 (two) times daily as needed (psoriasis).   ezetimibe (ZETIA) 10 MG tablet TAKE 1 TABLET EVERY DAY   lactulose (CHRONULAC) 10 GM/15ML solution Take 30 mLs (20 g total) by mouth 2 (two) times daily.   omeprazole (PRILOSEC) 40 MG capsule Take 1 capsule (40 mg total) by mouth daily.     Allergies  Allergen Reactions   Lipitor [Atorvastatin Calcium] Other (See Comments)    shaking   Sulfa Antibiotics Other (See Comments)    unknown   Zocor [Simvastatin - High Dose] Other (See Comments)    shaking    Social History   Socioeconomic History   Marital status: Married    Spouse name: Fleet Contras   Number of children: 2   Years of education: Not on file    Highest education level: Not on file  Occupational History   Occupation: Disabled    Employer: Naval architect  Tobacco Use   Smoking status: Every Day    Current packs/day: 2.00    Types: Cigarettes   Smokeless tobacco: Never   Tobacco comments:    Vaping more then cigs  Vaping Use   Vaping status: Some Days  Substance and Sexual Activity   Alcohol use: No   Drug use: No   Sexual activity: Not on file  Other Topics Concern   Not on file  Social History Narrative   1 step-son. 1 daughter.    4 grandchildren.   Social Drivers of Health   Financial Resource Strain: Medium Risk (06/02/2021)   Overall Financial Resource Strain (CARDIA)    Difficulty of Paying Living Expenses: Somewhat hard  Food Insecurity: Food Insecurity Present (06/02/2021)   Hunger Vital Sign    Worried About Running Out of Food in the Last Year: Sometimes true    Ran Out of Food in the Last Year: Sometimes true  Transportation Needs: No Transportation Needs (06/02/2021)   PRAPARE - Administrator, Civil Service (Medical): No    Lack of Transportation (Non-Medical): No  Physical Activity: Insufficiently Active (06/02/2021)   Exercise Vital Sign    Days of Exercise per Week: 2 days    Minutes of Exercise  per Session: 10 min  Stress: Stress Concern Present (06/02/2021)   [employer] of Occupational Health - Occupational Stress Questionnaire    Feeling of Stress : To some extent  Social Connections: Socially Integrated (06/02/2021)   Social Connection and Isolation Panel [NHANES]    Frequency of Communication with Friends and Family: More than three times a week    Frequency of Social Gatherings with Friends and Family: Once a week    Attends Religious Services: 1 to 4 times per year    Active Member of [employer] or Organizations: Yes    Attends Banker Meetings: 1 to 4 times per year    Marital Status: Married  Catering manager Violence: Not At Risk (06/02/2021)    Humiliation, Afraid, Rape, and Kick questionnaire    Fear of Current or Ex-Partner: No    Emotionally Abused: No    Physically Abused: No    Sexually Abused: No     Review of Systems: General: negative for chills, fever, night sweats or weight changes.  Cardiovascular: negative for chest pain, dyspnea on exertion, edema, orthopnea, palpitations, paroxysmal nocturnal dyspnea or shortness of breath Dermatological: negative for rash Respiratory: negative for cough or wheezing Urologic: negative for hematuria Abdominal: negative for nausea, vomiting, diarrhea, bright red blood per rectum, melena, or hematemesis Neurologic: negative for visual changes, syncope, or dizziness All other systems reviewed and are otherwise negative except as noted above.    Blood pressure 136/74, pulse 61, height 6' (1.829 m), weight 259 lb 6.4 oz (117.7 kg), SpO2 91%.  General appearance: alert and no distress Neck: no adenopathy, no JVD, supple, symmetrical, trachea midline, thyroid not enlarged, symmetric, no tenderness/mass/nodules, and bilateral carotid bruits versus transmitted murmur. Lungs: clear to auscultation bilaterally Heart: 3/6 high-pitched outflow tract murmur consistent with aortic stenosis Extremities: extremities normal, atraumatic, no cyanosis or edema Pulses: 2+ and symmetric Skin: Skin color, texture, turgor normal. No rashes or lesions Neurologic: Grossly normal  EKG EKG Interpretation Date/Time:  Monday July 16 2023 14:55:10 EST Ventricular Rate:  64 PR Interval:  180 QRS Duration:  92 QT Interval:  436 QTC Calculation: 449 R Axis:   -21  Text Interpretation: Normal sinus rhythm with sinus arrhythmia Normal ECG When compared with ECG of 29-May-2023 14:01, No significant change was found Confirmed by Nanetta Batty [phone]) on 07/16/2023 3:01:07 PM    ASSESSMENT AND PLAN:   Hypertension History of essential hypertension her blood pressure measured today at 136/74.  He is  on low-dose carvedilol.  Smoker Ongoing tobacco use of 1 pack/day for the last 50 years plus.  He does have a history of COPD.  HLD (hyperlipidemia) History of hyperlipidemia on statin therapy with lipid profile performed 11/24/2022 revealing total cholesterol 147, LDL 72 and HDL 33.  Aortic stenosis Patient has a high-pitched outflow tract murmur consistent with aortic stenosis.  2D echo performed earlier today revealed normal LV systolic function, grade 1 diastolic dysfunction with mild aortic stenosis.  His aortic valve area measured 1.54 cm with a peak gradient of 34 mmHg.  Will continue to follow this on annual basis.     Runell Gess MD FACP,FACC,FAHA, [facility] 07/16/2023 3:16 PM

## 2023-07-27 ENCOUNTER — Encounter (INDEPENDENT_AMBULATORY_CARE_PROVIDER_SITE_OTHER): Payer: Self-pay | Admitting: *Deleted

## 2023-07-30 ENCOUNTER — Inpatient Hospital Stay
Admission: RE | Admit: 2023-07-30 | Discharge: 2023-07-30 | Disposition: A | Discharge status: Home / Self Care | Payer: Medicare HMO | Source: Ambulatory Visit | Attending: Interventional Radiology | Admitting: Interventional Radiology

## 2023-08-03 ENCOUNTER — Ambulatory Visit (HOSPITAL_COMMUNITY): Admission: RE | Admit: 2023-08-03 | Payer: Medicare HMO | Source: Ambulatory Visit

## 2023-08-08 NOTE — Progress Notes (Signed)
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 male with a medical history significant for DM, COPD, HTN, sleep apnea, colonic polyps and NASH cirrhosis. The patient is also a smoker and had a lung cancer screening CT in July 2024. This showed cirrhosis with splenomegaly and vascular collateralization in the left abdomen. His GI doctor brought him in for an EDG 06/01/23 which revealed Grade II esophageal and gastric varices with no stigmata of recent bleeding. During a clinic visit the patient's wife reported that the patient was having trouble with his memory and he was started on lactulose.   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 He presents today alongside his wife.  He denies any prior episodes of hematemesis.  He does have occasional melena, however confounded by known hemorrhoids.  Additional imaging was needed to determine his candidacy for intervention and he underwent CT imaging and echocardiogram. He presents today to discuss these results and potential TIPS procedure. No changes in symptoms since last visit.  He has been seen by Cardiology and diagnosed with mild aortic stenosis.  He has good ejection fraction.  CTA demonstrates a large gastric varix with gastrorenal shunt.   Past Medical History:  Diagnosis Date   Allergy    Phreesia 03/01/2020   Arthritis    Phreesia 03/01/2020   Cataract    Phreesia 03/01/2020   Chest pain    Diagnostic cardiac cath October 2012. Normal coronary arteries, left ventricular function   COPD (chronic obstructive pulmonary disease) (HCC)    Phreesia 03/01/2020   DDD (degenerative disc disease), lumbar     Diabetes mellitus    Diabetes mellitus without complication (HCC)    Phreesia 03/01/2020   Heart murmur    History of back surgery    History of neck surgery    HLD (hyperlipidemia) 12/09/2012   Hyperlipidemia    Phreesia 03/01/2020   Hypertension    Obesity    Right knee injury    Sleep apnea    Smoker     Past Surgical History:  Procedure Laterality Date   APPENDECTOMY     BACK SURGERY     for ruptured disc lumbar area   BIOPSY  06/01/2023   Procedure: BIOPSY;  Surgeon: [name], MD;  Location: [facility];  Service: Gastroenterology;;   CARDIAC CATHETERIZATION  Oct 2012   nml coronary arteries, aorta, LV fcn   COLONOSCOPY WITH PROPOFOL N/A 01/31/2023   Procedure: COLONOSCOPY WITH PROPOFOL;  Surgeon: [name], MD;  Location: [facility];  Service: Gastroenterology;  Laterality: N/A;  12:45pm;ASA 1-2   ESOPHAGOGASTRODUODENOSCOPY (EGD) WITH PROPOFOL N/A 06/01/2023   Procedure: ESOPHAGOGASTRODUODENOSCOPY (EGD) WITH PROPOFOL;  Surgeon: [name], MD;  Location: [facility];  Service: Gastroenterology;  Laterality: N/A;  9:15AM;ASA 3   EYE SURGERY N/A    Phreesia 03/01/2020   HEMOSTASIS CLIP PLACEMENT  01/31/2023   Procedure: HEMOSTASIS CLIP PLACEMENT;  Surgeon: [name], MD;  Location: [facility];  Service: Gastroenterology;;   I & D EXTREMITY Right 02/06/2013   Procedure: IRRIGATION AND DEBRIDEMENT INDEX FINGER;  Surgeon: Tami Ribas, MD;  Location: [facility];  Service: Orthopedics;  Laterality: Right;   IR RADIOLOGIST EVAL &  MGMT  06/20/2023   NECK SURGERY     for ruptured disc   POLYPECTOMY  01/31/2023   Procedure: POLYPECTOMY;  Surgeon: [name], MD;  Location: [facility];  Service: Gastroenterology;;   SPINE SURGERY N/A    Phreesia 03/01/2020   TONSILLECTOMY      Allergies: Lipitor [atorvastatin calcium], Sulfa antibiotics, and Zocor [simvastatin - high dose]  Medications: Prior  to Admission medications   Medication Sig Start Date End Date Taking? Authorizing Provider  acetaminophen (TYLENOL) 500 MG tablet Take 1,000 mg by mouth every 8 (eight) hours as needed for moderate pain.    [provider]  carvedilol (COREG) 3.125 MG tablet Take 1 tablet (3.125 mg total) by mouth 2 (two) times daily with a meal. 06/01/23   Marguerita Merles, Reuel Boom, MD  Clobetasol Propionate 0.05 % lotion Apply 1 Application topically 2 (two) times daily as needed (psoriasis). 11/24/22   [provider]  ezetimibe (ZETIA) 10 MG tablet TAKE 1 TABLET EVERY DAY 08/30/21   Donita Brooks, MD  lactulose (CHRONULAC) 10 GM/15ML solution Take 30 mLs (20 g total) by mouth 2 (two) times daily. 05/10/23   [name], MD  omeprazole (PRILOSEC) 40 MG capsule Take 1 capsule (40 mg total) by mouth daily. 06/01/23   [name], MD     Family History  Problem Relation Age of Onset   Dementia Mother    Heart disease Father     Social History   Socioeconomic History   Marital status: Married    Spouse name: Fleet Contras   Number of children: 2   Years of education: Not on file   Highest education level: Not on file  Occupational History   Occupation: Disabled    Employer: [employer]  Tobacco Use   Smoking status: Every Day    Current packs/day: 2.00    Types: Cigarettes   Smokeless tobacco: Never   Tobacco comments:    Vaping more then cigs  Vaping Use   Vaping status: Some Days  Substance and Sexual Activity   Alcohol use: No   Drug use: No   Sexual activity: Not on file  Other Topics Concern   Not on file  Social History Narrative   1 step-son. 1 daughter.    4 grandchildren.   Social Drivers of Health   Financial Resource Strain: Medium Risk (06/02/2021)   Overall Financial Resource Strain (CARDIA)    Difficulty of Paying Living Expenses: Somewhat hard  Food Insecurity: Food Insecurity Present (06/02/2021)   Hunger Vital Sign     Worried About Running Out of Food in the Last Year: Sometimes true    Ran Out of Food in the Last Year: Sometimes true  Transportation Needs: No Transportation Needs (06/02/2021)   PRAPARE - Administrator, Civil Service (Medical): No    Lack of Transportation (Non-Medical): No  Physical Activity: Insufficiently Active (06/02/2021)   Exercise Vital Sign    Days of Exercise per Week: 2 days    Minutes of Exercise per Session: 10 min  Stress: Stress Concern Present (06/02/2021)   [employer] of Occupational Health - Occupational Stress Questionnaire    Feeling of Stress : To some extent  Social Connections: Socially Integrated (06/02/2021)   Social Connection and Isolation Panel [NHANES]    Frequency of Communication with Friends and Family: More than three times a week    Frequency of Social Gatherings with Friends and Family: Once a week  Attends Religious Services: 1 to 4 times per year    Active Member of Clubs or Organizations: Yes    Attends Banker Meetings: 1 to 4 times per year    Marital Status: Married     Vital Signs: There were no vitals taken for this visit.  No physical exam was performed in lieu of virtual telephone visit.    Imaging: CT Chest 02/16/23   Cirrhosis, splenomegaly, suggestion of large gastric varices with gastrorenal shunt.   US Hepatic Doppler (05/21/23) IMPRESSION: Directed duplex of the hepatic vasculature is unremarkable   CTA [facility] 07/12/23  Patent portal system   Large gastric varix draining via left renal vein   Echocardiogram 07/16/23 IMPRESSIONS   1. Left ventricular ejection fraction, by estimation, is 60 to 65%. Left  ventricular ejection fraction by 3D volume is 59 %. The left ventricle has  normal function. The left ventricle has no regional wall motion  abnormalities. The left ventricular  internal cavity size was mildly dilated. Left ventricular diastolic  parameters are consistent with Grade  I diastolic dysfunction (impaired  relaxation). The average left ventricular global longitudinal strain is  -22.5 %. The global longitudinal strain is  normal.   2. Right ventricular systolic function is low normal. The right  ventricular size is mildly enlarged. There is normal pulmonary artery  systolic pressure. The estimated right ventricular systolic pressure is  28.1 mmHg.   3. Left atrial size was moderately dilated.   4. Right atrial size was moderately dilated.   5. The mitral valve is degenerative. Mild mitral valve regurgitation. No  evidence of mitral stenosis.   6. The aortic valve is calcified. There is moderate calcification of the  aortic valve. There is moderate thickening of the aortic valve. Aortic  valve regurgitation is not visualized. Mild aortic valve stenosis. Aortic  valve area, by VTI measures 1.54  cm. Aortic valve mean gradient measures 18.8 mmHg. Aortic valve Vmax  measures 2.92 m/s.   7. The inferior vena cava is dilated in size with >50% respiratory  variability, suggesting right atrial pressure of 8 mmHg.   Comparison(s): 2022 AV mean gradient 17 mmHg.   Labs:  CBC: Recent Labs    05/10/23 1028  WBC 5.2  HGB 13.1*  HCT 39.6  PLT 92*    COAGS: Recent Labs    05/10/23 1028  INR 1.1    BMP: Recent Labs    05/10/23 1028 07/12/23 1336  NA 141  --   K 3.9  --   CL 108  --   CO2 24  --   GLUCOSE 120*  --   BUN 12  --   CALCIUM 9.1  --   CREATININE 0.98 1.20    LIVER FUNCTION TESTS: Recent Labs    05/10/23 1028  BILITOT 0.7  AST 25  ALT 15  PROT 7.0    Assessment and Plan: 73 year old male with a history of compensated MASH cirrhosis (CP A6, MELD 7) with portal hypertension 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  No prior episodes of hematemesis or significant gastrointestinal hemorrhage. 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  We discussed in depth the rationale of transvenous obliteration of gastric varices in addition to TIPS creation.  We discussed that obliteration of gastric would significantly decrease chances of fatal gastrointestinal hemorrhage, however would not improve his underlying  portal hypertension, which would be corrected with TIPS.  We discussed that TIPS introduces another portosystemic shunt, increasing his risk of worsening hepatic encephalopathy, but with embolization of the gastrorenal shunt we expect him to have no greate encephalopathy than he does now, which is minimal. He and his wife are in favor of the more comprehensive approach of combined TIPS + retrograde transvenous obliteration of gastric varix.    Risks and benefits of TIPS, BRTO and/or additional variceal embolization were discussed with the patient and/or the patient's family including, but not limited to, infection, bleeding, damage to adjacent structures, worsening hepatic and/or cardiac function, worsening and/or the development of altered mental status/encephalopathy, non-target embolization and death.   All of the patient's questions were answered, patient is agreeable to proceed.   Plan for combined TIPS + retrograde transvenous obliteration of gastric varix at [facility] with general anesthesia.  Plan for inpatient only procedure overnight observation.  Plan for Foley + standard TIPS antibiotic prophylaxis.     Marliss Coots, MD Pager: [phone] Clinic: [phone]     I spent a total of 25 Minutes in virtual telephone clinical consultation, greater than 50% of which was counseling/coordinating care for portal hypertension.

## 2023-08-10 ENCOUNTER — Ambulatory Visit
Admission: RE | Admit: 2023-08-10 | Discharge: 2023-08-10 | Disposition: A | Discharge status: Home / Self Care | Payer: Medicare HMO | Source: Ambulatory Visit | Attending: Interventional Radiology | Admitting: Interventional Radiology

## 2023-08-10 DIAGNOSIS — K766 Portal hypertension: Secondary | ICD-10-CM

## 2023-08-10 DIAGNOSIS — K7460 Unspecified cirrhosis of liver: Secondary | ICD-10-CM

## 2023-08-10 DIAGNOSIS — I864 Gastric varices: Secondary | ICD-10-CM | POA: Diagnosis not present

## 2023-08-10 DIAGNOSIS — I8510 Secondary esophageal varices without bleeding: Secondary | ICD-10-CM

## 2023-08-10 DIAGNOSIS — I8500 Esophageal varices without bleeding: Secondary | ICD-10-CM | POA: Diagnosis not present

## 2023-08-10 HISTORY — PX: IR RADIOLOGIST EVAL & MGMT: IMG5224

## 2023-08-14 ENCOUNTER — Encounter: Payer: Self-pay | Admitting: Acute Care

## 2023-08-14 ENCOUNTER — Other Ambulatory Visit (HOSPITAL_COMMUNITY): Payer: Self-pay | Admitting: Interventional Radiology

## 2023-08-14 DIAGNOSIS — Z122 Encounter for screening for malignant neoplasm of respiratory organs: Secondary | ICD-10-CM

## 2023-08-14 DIAGNOSIS — K7460 Unspecified cirrhosis of liver: Secondary | ICD-10-CM

## 2023-08-14 DIAGNOSIS — Z87891 Personal history of nicotine dependence: Secondary | ICD-10-CM

## 2023-08-14 DIAGNOSIS — F17210 Nicotine dependence, cigarettes, uncomplicated: Secondary | ICD-10-CM

## 2023-08-16 DIAGNOSIS — J449 Chronic obstructive pulmonary disease, unspecified: Secondary | ICD-10-CM | POA: Diagnosis not present

## 2023-08-16 DIAGNOSIS — J988 Other specified respiratory disorders: Secondary | ICD-10-CM | POA: Diagnosis not present

## 2023-08-17 ENCOUNTER — Ambulatory Visit (HOSPITAL_COMMUNITY)
Admission: RE | Admit: 2023-08-17 | Discharge: 2023-08-17 | Disposition: A | Discharge status: Home / Self Care | Payer: Medicare HMO | Source: Ambulatory Visit | Attending: Cardiovascular Disease | Admitting: Cardiovascular Disease

## 2023-08-17 DIAGNOSIS — R0989 Other specified symptoms and signs involving the circulatory and respiratory systems: Secondary | ICD-10-CM | POA: Diagnosis not present

## 2023-08-27 ENCOUNTER — Encounter (HOSPITAL_COMMUNITY): Payer: Self-pay | Admitting: Interventional Radiology

## 2023-08-27 ENCOUNTER — Other Ambulatory Visit: Payer: Self-pay | Admitting: Radiology

## 2023-08-27 ENCOUNTER — Other Ambulatory Visit: Payer: Self-pay

## 2023-08-27 DIAGNOSIS — K766 Portal hypertension: Secondary | ICD-10-CM

## 2023-08-27 NOTE — Progress Notes (Signed)
PCP - Dr. Irven Coe Cardiologist - Dr. Nanetta Batty  PPM/ICD - denies   Chest x-ray - 05/02/11 EKG - 07/16/23 Stress Test - denies ECHO - 07/16/23 Cardiac Cath - 05/04/11 CPAP - OSA+, denies CPAP  DM- pt denies that he is diabetic, he does not check CBG  ASA/Blood Thinner Instructions: n/a   ERAS Protcol - clears until 0915  COVID TEST- n/a  Anesthesia review: yes  Patient verbally denies any shortness of breath, fever, cough and chest pain during phone call      Questions were answered. Patient verbalized understanding of instructions.

## 2023-08-27 NOTE — Pre-Procedure Instructions (Signed)
-------------    SDW INSTRUCTIONS given:  Your procedure is scheduled on 2/4.  Report to [facility] Main Entrance "A" at 09:45 A.M., and check in at the Admitting office.  Any questions or running late day of surgery: call [phone]    Remember:  Do not eat after midnight the night before your surgery  You may drink clear liquids until 09:15 AM the morning of your surgery.   Clear liquids allowed are: Water, Non-Citrus Juices (without pulp), Carbonated Beverages, Clear Tea, Black Coffee Only, and Gatorade    Take these medicines the morning of surgery with A SIP OF WATER  Tylenol PRN, albuterol PRN (bring inhaler), coreg, zetia, atrovent, prilosec    As of today, STOP taking any Aspirin (unless otherwise instructed by your surgeon) Aleve, Naproxen, Ibuprofen, Motrin, Advil, Goody's, BC's, all herbal medications, fish oil, and all vitamins.   Do NOT Smoke (Tobacco/Vaping) 24 hours prior to your procedure  If you use a CPAP at night, you may bring all equipment for your overnight stay.     You will be asked to remove any contacts, glasses, piercing's, hearing aid's, dentures/partials prior to surgery. Please bring cases for these items if needed.     Patients discharged the day of surgery will not be allowed to drive home, and someone needs to stay with them for 24 hours.  SURGICAL WAITING ROOM VISITATION Patients may have no more than 2 support people in the waiting area - these visitors may rotate.   Pre-op nurse will coordinate an appropriate time for 1 ADULT support person, who may not rotate, to accompany patient in pre-op.  Children under the age of 80 must have an adult with them who is not the patient and must remain in the main waiting area with an adult.  If the patient needs to stay at the hospital during part of their recovery, the visitor guidelines for inpatient rooms apply.  Please refer to the [facility] website for the visitor guidelines for any additional  information.   Special instructions:   [facility]- Preparing For Surgery   Please follow these instructions carefully.   Shower the NIGHT BEFORE SURGERY and the MORNING OF SURGERY with DIAL Soap.   Pat yourself dry with a CLEAN TOWEL.  Wear CLEAN PAJAMAS to bed the night before surgery  Place CLEAN SHEETS on your bed the night of your first shower and DO NOT SLEEP WITH PETS.   Additional instructions for the day of surgery: DO NOT APPLY any lotions, deodorants, cologne, or perfumes.   Do not wear jewelry or makeup Do not wear nail polish, gel polish, artificial nails, or any other type of covering on natural nails (fingers and toes) Do not bring valuables to the hospital. [facility] is not responsible for valuables/personal belongings. Put on clean/comfortable clothes.  Please brush your teeth.  Ask your nurse before applying any prescription medications to the skin.

## 2023-08-27 NOTE — Progress Notes (Signed)
Patient reports that he has recently had an URI and completed antibiotics on 2/2.  Patient reports that he still has head congestion and a cough.   IR scheduler and Dr. Elby Showers were notified and no new orders received at this time.

## 2023-08-28 ENCOUNTER — Encounter (HOSPITAL_COMMUNITY): Payer: Self-pay

## 2023-08-28 ENCOUNTER — Encounter (HOSPITAL_COMMUNITY)
Admission: RE | Disposition: A | Discharge status: Home / Self Care | Payer: Self-pay | Source: Home / Self Care | Attending: Interventional Radiology

## 2023-08-28 ENCOUNTER — Inpatient Hospital Stay (HOSPITAL_COMMUNITY)
Admission: RE | Admit: 2023-08-28 | Discharge: 2023-08-29 | DRG: 406 | Disposition: A | Discharge status: Home / Self Care | Payer: Medicare HMO | Attending: Interventional Radiology | Admitting: Interventional Radiology

## 2023-08-28 ENCOUNTER — Inpatient Hospital Stay (HOSPITAL_COMMUNITY): Payer: Self-pay | Admitting: Physician Assistant

## 2023-08-28 ENCOUNTER — Other Ambulatory Visit: Payer: Self-pay

## 2023-08-28 ENCOUNTER — Encounter (HOSPITAL_COMMUNITY): Payer: Self-pay | Admitting: Interventional Radiology

## 2023-08-28 ENCOUNTER — Inpatient Hospital Stay (HOSPITAL_COMMUNITY)
Admission: RE | Admit: 2023-08-28 | Discharge: 2023-08-28 | Disposition: A | Discharge status: Home / Self Care | Payer: Medicare HMO | Source: Ambulatory Visit | Attending: Interventional Radiology | Admitting: Interventional Radiology

## 2023-08-28 DIAGNOSIS — I864 Gastric varices: Secondary | ICD-10-CM | POA: Diagnosis present

## 2023-08-28 DIAGNOSIS — G4730 Sleep apnea, unspecified: Secondary | ICD-10-CM | POA: Diagnosis present

## 2023-08-28 DIAGNOSIS — E785 Hyperlipidemia, unspecified: Secondary | ICD-10-CM | POA: Diagnosis not present

## 2023-08-28 DIAGNOSIS — K766 Portal hypertension: Secondary | ICD-10-CM | POA: Diagnosis not present

## 2023-08-28 DIAGNOSIS — K7030 Alcoholic cirrhosis of liver without ascites: Secondary | ICD-10-CM | POA: Diagnosis not present

## 2023-08-28 DIAGNOSIS — K7581 Nonalcoholic steatohepatitis (NASH): Secondary | ICD-10-CM | POA: Diagnosis not present

## 2023-08-28 DIAGNOSIS — K7460 Unspecified cirrhosis of liver: Secondary | ICD-10-CM | POA: Diagnosis not present

## 2023-08-28 DIAGNOSIS — I10 Essential (primary) hypertension: Secondary | ICD-10-CM | POA: Diagnosis not present

## 2023-08-28 DIAGNOSIS — Z79899 Other long term (current) drug therapy: Secondary | ICD-10-CM | POA: Diagnosis not present

## 2023-08-28 DIAGNOSIS — K219 Gastro-esophageal reflux disease without esophagitis: Secondary | ICD-10-CM | POA: Diagnosis not present

## 2023-08-28 DIAGNOSIS — F17210 Nicotine dependence, cigarettes, uncomplicated: Secondary | ICD-10-CM | POA: Diagnosis present

## 2023-08-28 DIAGNOSIS — Z860100 Personal history of colon polyps, unspecified: Secondary | ICD-10-CM | POA: Diagnosis not present

## 2023-08-28 DIAGNOSIS — Z6834 Body mass index (BMI) 34.0-34.9, adult: Secondary | ICD-10-CM | POA: Diagnosis not present

## 2023-08-28 DIAGNOSIS — G4733 Obstructive sleep apnea (adult) (pediatric): Secondary | ICD-10-CM

## 2023-08-28 DIAGNOSIS — R161 Splenomegaly, not elsewhere classified: Secondary | ICD-10-CM | POA: Diagnosis present

## 2023-08-28 DIAGNOSIS — E119 Type 2 diabetes mellitus without complications: Secondary | ICD-10-CM | POA: Diagnosis not present

## 2023-08-28 DIAGNOSIS — E669 Obesity, unspecified: Secondary | ICD-10-CM | POA: Diagnosis not present

## 2023-08-28 DIAGNOSIS — Z95828 Presence of other vascular implants and grafts: Secondary | ICD-10-CM

## 2023-08-28 DIAGNOSIS — J449 Chronic obstructive pulmonary disease, unspecified: Secondary | ICD-10-CM

## 2023-08-28 DIAGNOSIS — Z8249 Family history of ischemic heart disease and other diseases of the circulatory system: Secondary | ICD-10-CM

## 2023-08-28 DIAGNOSIS — I8500 Esophageal varices without bleeding: Secondary | ICD-10-CM | POA: Diagnosis not present

## 2023-08-28 HISTORY — PX: IR US GUIDE VASC ACCESS RIGHT: IMG2390

## 2023-08-28 HISTORY — PX: IR EMBO ART  VEN HEMORR LYMPH EXTRAV  INC GUIDE ROADMAPPING: IMG5450

## 2023-08-28 HISTORY — PX: IR EMBO VENOUS NOT HEMORR HEMANG  INC GUIDE ROADMAPPING: IMG5447

## 2023-08-28 HISTORY — PX: TIPS PROCEDURE: SHX808

## 2023-08-28 HISTORY — PX: IR ANGIOGRAM SELECTIVE EACH ADDITIONAL VESSEL: IMG667

## 2023-08-28 HISTORY — PX: IR TIPS: IMG2295

## 2023-08-28 HISTORY — PX: IR INTRAVASCULAR ULTRASOUND NON CORONARY: IMG6085

## 2023-08-28 LAB — COMPREHENSIVE METABOLIC PANEL
ALT: 17 U/L (ref 0–44)
AST: 24 U/L (ref 15–41)
Albumin: 3.2 g/dL — ABNORMAL LOW (ref 3.5–5.0)
Alkaline Phosphatase: 61 U/L (ref 38–126)
Anion gap: 9 (ref 5–15)
BUN: 16 mg/dL (ref 8–23)
CO2: 24 mmol/L (ref 22–32)
Calcium: 8.8 mg/dL — ABNORMAL LOW (ref 8.9–10.3)
Chloride: 105 mmol/L (ref 98–111)
Creatinine, Ser: 1.07 mg/dL (ref 0.61–1.24)
GFR, Estimated: >60 mL/min (ref >60)
Glucose, Bld: 95 mg/dL (ref 70–99)
Potassium: 3.7 mmol/L (ref 3.5–5.1)
Sodium: 138 mmol/L (ref 135–145)
Total Bilirubin: 0.9 mg/dL (ref 0.0–1.2)
Total Protein: 6.5 g/dL (ref 6.5–8.1)

## 2023-08-28 LAB — ABO/RH: ABO/RH(D): O POS

## 2023-08-28 LAB — CBC
HCT: 41.8 % (ref 39.0–52.0)
Hemoglobin: 13.9 g/dL (ref 13.0–17.0)
MCH: 30.2 pg (ref 26.0–34.0)
MCHC: 33.3 g/dL (ref 30.0–36.0)
MCV: 90.9 fL (ref 80.0–100.0)
Platelets: 70 10*3/uL — ABNORMAL LOW (ref 150–400)
RBC: 4.6 MIL/uL (ref 4.22–5.81)
RDW: 15.3 % (ref 11.5–15.5)
WBC: 5.7 10*3/uL (ref 4.0–10.5)
nRBC: 0 % (ref 0.0–0.2)

## 2023-08-28 LAB — PROTIME-INR
INR: 1.2 (ref 0.8–1.2)
Prothrombin Time: 15.1 s (ref 11.4–15.2)

## 2023-08-28 LAB — GLUCOSE, CAPILLARY
Glucose-Capillary: 92 mg/dL (ref 70–99)
Glucose-Capillary: 110 mg/dL — ABNORMAL HIGH (ref 70–99)
Glucose-Capillary: 136 mg/dL — ABNORMAL HIGH (ref 70–99)

## 2023-08-28 LAB — TYPE AND SCREEN
ABO/RH(D): O POS
Antibody Screen: NEGATIVE

## 2023-08-28 SURGERY — TRANS-JUGULAR INTRAHEPATIC PORTAL SHUNT (TIPS)
Anesthesia: General

## 2023-08-28 MED ORDER — ONDANSETRON HCL 4 MG/2ML IJ SOLN
INTRAMUSCULAR | Status: DC | PRN
  Administered 2023-08-28: 4 mg via INTRAVENOUS

## 2023-08-28 MED ORDER — PHENYLEPHRINE HCL-NACL 20-0.9 MG/250ML-% IV SOLN
INTRAVENOUS | Status: DC | PRN
  Administered 2023-08-28: 40 ug/min via INTRAVENOUS

## 2023-08-28 MED ORDER — FENTANYL CITRATE (PF) 100 MCG/2ML IJ SOLN
INTRAMUSCULAR | Status: AC
  Filled 2023-08-28: qty 2

## 2023-08-28 MED ORDER — PROPOFOL 10 MG/ML IV BOLUS
INTRAVENOUS | Status: DC | PRN
  Administered 2023-08-28: 150 mg via INTRAVENOUS

## 2023-08-28 MED ORDER — ALBUMIN HUMAN 5 % IV SOLN: INTRAVENOUS | Status: DC | PRN

## 2023-08-28 MED ORDER — ZINC GLUCONATE 50 MG PO TABS: 50.0000 mg | ORAL_TABLET | Freq: Every day | ORAL | Status: DC

## 2023-08-28 MED ORDER — SUCCINYLCHOLINE CHLORIDE 200 MG/10ML IV SOSY
PREFILLED_SYRINGE | INTRAVENOUS | Status: DC | PRN
  Administered 2023-08-28: 120 mg via INTRAVENOUS

## 2023-08-28 MED ORDER — ROCURONIUM BROMIDE 10 MG/ML (PF) SYRINGE
PREFILLED_SYRINGE | INTRAVENOUS | Status: DC | PRN
  Administered 2023-08-28: 50 mg via INTRAVENOUS
  Administered 2023-08-28: 10 mg via INTRAVENOUS
  Administered 2023-08-28: 20 mg via INTRAVENOUS

## 2023-08-28 MED ORDER — SUGAMMADEX SODIUM 200 MG/2ML IV SOLN
INTRAVENOUS | Status: DC | PRN
  Administered 2023-08-28: 400 mg via INTRAVENOUS

## 2023-08-28 MED ORDER — LIPIODOL ULTRAFLUID INJECTION
10.0000 mL | Freq: Once | INTRAMUSCULAR | Status: AC
  Administered 2023-08-28: 2 mL via INTRA_ARTERIAL

## 2023-08-28 MED ORDER — FENTANYL CITRATE (PF) 100 MCG/2ML IJ SOLN
25.0000 ug | INTRAMUSCULAR | Status: DC | PRN
Start: 2023-08-28 — End: 2023-08-28

## 2023-08-28 MED ORDER — ZINC SULFATE 220 (50 ZN) MG PO CAPS
220.0000 mg | ORAL_CAPSULE | Freq: Every day | ORAL | Status: DC
  Administered 2023-08-28 – 2023-08-29 (×2): 220 mg via ORAL
  Filled 2023-08-28 (×2): qty 1

## 2023-08-28 MED ORDER — ACETAMINOPHEN 500 MG PO TABS
1000.0000 mg | ORAL_TABLET | Freq: Four times a day (QID) | ORAL | Status: DC | PRN
  Administered 2023-08-28 – 2023-08-29 (×2): 1000 mg via ORAL
  Filled 2023-08-28 (×2): qty 2

## 2023-08-28 MED ORDER — CHLORHEXIDINE GLUCONATE 0.12 % MT SOLN: 15.0000 mL | Freq: Once | OROMUCOSAL | Status: AC

## 2023-08-28 MED ORDER — LACTULOSE 10 GM/15ML PO SOLN
20.0000 g | Freq: Three times a day (TID) | ORAL | Status: DC
Start: 2023-08-28 — End: 2023-08-29
  Administered 2023-08-28 – 2023-08-29 (×3): 20 g via ORAL
  Filled 2023-08-28 (×3): qty 30

## 2023-08-28 MED ORDER — IOHEXOL 300 MG/ML  SOLN
100.0000 mL | Freq: Once | INTRAMUSCULAR | Status: AC | PRN
  Administered 2023-08-28: 75 mL via INTRAVENOUS

## 2023-08-28 MED ORDER — EZETIMIBE 10 MG PO TABS
10.0000 mg | ORAL_TABLET | Freq: Every day | ORAL | Status: DC
  Administered 2023-08-29: 10 mg via ORAL
  Filled 2023-08-28: qty 1

## 2023-08-28 MED ORDER — IOHEXOL 300 MG/ML  SOLN
100.0000 mL | Freq: Once | INTRAMUSCULAR | Status: AC | PRN
  Administered 2023-08-28: 25 mL via INTRAVENOUS

## 2023-08-28 MED ORDER — PHENYLEPHRINE 80 MCG/ML (10ML) SYRINGE FOR IV PUSH (FOR BLOOD PRESSURE SUPPORT)
PREFILLED_SYRINGE | INTRAVENOUS | Status: DC | PRN
  Administered 2023-08-28: 160 ug via INTRAVENOUS
  Administered 2023-08-28: 80 ug via INTRAVENOUS

## 2023-08-28 MED ORDER — SODIUM CHLORIDE 0.9 % IV SOLN
2.0000 g | INTRAVENOUS | Status: AC
  Administered 2023-08-28: 2 g via INTRAVENOUS

## 2023-08-28 MED ORDER — PANTOPRAZOLE SODIUM 40 MG PO TBEC: 40.0000 mg | DELAYED_RELEASE_TABLET | Freq: Every day | ORAL | Status: DC

## 2023-08-28 MED ORDER — SODIUM CHLORIDE 0.9 % IV SOLN: INTRAVENOUS | Status: DC

## 2023-08-28 MED ORDER — OXYCODONE HCL 5 MG PO TABS: 10.0000 mg | ORAL_TABLET | ORAL | Status: DC | PRN

## 2023-08-28 MED ORDER — DEXAMETHASONE SODIUM PHOSPHATE 10 MG/ML IJ SOLN
INTRAMUSCULAR | Status: DC | PRN
  Administered 2023-08-28: 5 mg via INTRAVENOUS

## 2023-08-28 MED ORDER — IPRATROPIUM BROMIDE 0.06 % NA SOLN
2.0000 | Freq: Two times a day (BID) | NASAL | Status: DC
  Administered 2023-08-28 – 2023-08-29 (×2): 2 via NASAL
  Filled 2023-08-28: qty 15
  Filled 2023-08-28: qty 0.2
  Filled 2023-08-28: qty 30

## 2023-08-28 MED ORDER — CARVEDILOL 3.125 MG PO TABS
3.1250 mg | ORAL_TABLET | Freq: Two times a day (BID) | ORAL | Status: DC
  Administered 2023-08-28 – 2023-08-29 (×2): 3.125 mg via ORAL
  Filled 2023-08-28 (×2): qty 1

## 2023-08-28 MED ORDER — HYDRALAZINE HCL 20 MG/ML IJ SOLN: 10.0000 mg | Freq: Four times a day (QID) | INTRAMUSCULAR | Status: DC | PRN

## 2023-08-28 MED ORDER — LIDOCAINE 2% (20 MG/ML) 5 ML SYRINGE
INTRAMUSCULAR | Status: DC | PRN
  Administered 2023-08-28: 80 mg via INTRAVENOUS

## 2023-08-28 MED ORDER — CHLORHEXIDINE GLUCONATE 0.12 % MT SOLN
OROMUCOSAL | Status: AC
  Administered 2023-08-28: 15 mL via OROMUCOSAL
  Filled 2023-08-28: qty 15

## 2023-08-28 MED ORDER — PANTOPRAZOLE SODIUM 40 MG PO TBEC
40.0000 mg | DELAYED_RELEASE_TABLET | Freq: Every day | ORAL | Status: DC
  Administered 2023-08-28 – 2023-08-29 (×2): 40 mg via ORAL
  Filled 2023-08-28 (×2): qty 1

## 2023-08-28 MED ORDER — FENTANYL CITRATE (PF) 250 MCG/5ML IJ SOLN
INTRAMUSCULAR | Status: DC | PRN
  Administered 2023-08-28 (×2): 50 ug via INTRAVENOUS

## 2023-08-28 MED ORDER — LIDOCAINE-EPINEPHRINE 1 %-1:100000 IJ SOLN
INTRAMUSCULAR | Status: AC
  Filled 2023-08-28: qty 1

## 2023-08-28 MED ORDER — SODIUM CHLORIDE 0.9 % IV SOLN
INTRAVENOUS | Status: AC
  Filled 2023-08-28: qty 20

## 2023-08-28 MED ORDER — OXYCODONE HCL 5 MG PO TABS
5.0000 mg | ORAL_TABLET | ORAL | Status: DC | PRN
  Administered 2023-08-28: 5 mg via ORAL
  Filled 2023-08-28: qty 1

## 2023-08-28 MED ORDER — VITAMIN E 45 MG (100 UNIT) PO CAPS
400.0000 [IU] | ORAL_CAPSULE | Freq: Every day | ORAL | Status: DC
  Administered 2023-08-28 – 2023-08-29 (×2): 400 [IU] via ORAL
  Filled 2023-08-28 (×3): qty 4

## 2023-08-28 MED ORDER — ORAL CARE MOUTH RINSE: 15.0000 mL | Freq: Once | OROMUCOSAL | Status: AC

## 2023-08-28 MED ORDER — FAMOTIDINE IN NACL 20-0.9 MG/50ML-% IV SOLN: 20.0000 mg | Freq: Once | INTRAVENOUS | Status: DC

## 2023-08-28 MED ORDER — ALBUTEROL SULFATE (2.5 MG/3ML) 0.083% IN NEBU: 3.0000 mL | INHALATION_SOLUTION | RESPIRATORY_TRACT | Status: DC | PRN

## 2023-08-28 MED ORDER — ACETAMINOPHEN 10 MG/ML IV SOLN: 1000.0000 mg | Freq: Once | INTRAVENOUS | Status: DC | PRN

## 2023-08-28 MED ORDER — EPHEDRINE SULFATE-NACL 50-0.9 MG/10ML-% IV SOSY
PREFILLED_SYRINGE | INTRAVENOUS | Status: DC | PRN
  Administered 2023-08-28 (×2): 5 mg via INTRAVENOUS

## 2023-08-28 MED ORDER — LACTATED RINGERS IV SOLN: INTRAVENOUS | Status: DC | PRN

## 2023-08-28 MED ORDER — SODIUM CHLORIDE 0.9 % IV SOLN: 8.0000 mg | Freq: Four times a day (QID) | INTRAVENOUS | Status: DC | PRN

## 2023-08-28 MED ORDER — SODIUM TETRADECYL SULFATE 3 % IV SOLN
4.0000 mL | Freq: Once | INTRAVENOUS | Status: DC
  Filled 2023-08-28: qty 4

## 2023-08-28 NOTE — Anesthesia Postprocedure Evaluation (Signed)
 Anesthesia Post Note  Patient: Ricky Rios  Procedure(s) Performed: TRANS-JUGULAR INTRAHEPATIC PORTAL SHUNT (TIPS)     Patient location during evaluation: PACU Anesthesia Type: General Level of consciousness: awake and alert Pain management: pain level controlled Vital Signs Assessment: post-procedure vital signs reviewed and stable Respiratory status: spontaneous breathing, nonlabored ventilation, respiratory function stable and patient connected to nasal cannula oxygen Cardiovascular status: blood pressure returned to baseline and stable Postop Assessment: no apparent nausea or vomiting Anesthetic complications: no   There were no known notable events for this encounter.  Last Vitals:  Vitals:   08/28/23 1600 08/28/23 1641  BP: 135/62 (!) 141/69  Pulse: 63 66  Resp: 14 16  Temp: 37 C 36.9 C  SpO2: 97% 97%    Last Pain:  Vitals:   08/28/23 1641  TempSrc: Oral  PainSc: 4                  Izsak Meir P Evert Wenrich

## 2023-08-28 NOTE — Anesthesia Preprocedure Evaluation (Addendum)
Anesthesia Evaluation  Patient identified by MRN, date of birth, ID band Patient awake    Reviewed: Allergy & Precautions, NPO status , Patient's Chart, lab work & pertinent test results  Airway Mallampati: II  TM Distance: >3 FB Neck ROM: Full    Dental no notable dental hx.    Pulmonary sleep apnea , COPD,  COPD inhaler, Current Smoker and Patient abstained from smoking.   Pulmonary exam normal        Cardiovascular hypertension, Pt. on medications and Pt. on home beta blockers  Rhythm:Regular Rate:Normal  ECHO 2024:  1. Left ventricular ejection fraction, by estimation, is 60 to 65%. Left  ventricular ejection fraction by 3D volume is 59 %. The left ventricle has  normal function. The left ventricle has no regional wall motion  abnormalities. The left ventricular  internal cavity size was mildly dilated. Left ventricular diastolic  parameters are consistent with Grade I diastolic dysfunction (impaired  relaxation). The average left ventricular global longitudinal strain is  -22.5 %. The global longitudinal strain is  normal.   2. Right ventricular systolic function is low normal. The right  ventricular size is mildly enlarged. There is normal pulmonary artery  systolic pressure. The estimated right ventricular systolic pressure is  28.1 mmHg.   3. Left atrial size was moderately dilated.   4. Right atrial size was moderately dilated.   5. The mitral valve is degenerative. Mild mitral valve regurgitation. No  evidence of mitral stenosis.   6. The aortic valve is calcified. There is moderate calcification of the  aortic valve. There is moderate thickening of the aortic valve. Aortic  valve regurgitation is not visualized. Mild aortic valve stenosis. Aortic  valve area, by VTI measures 1.54  cm. Aortic valve mean gradient measures 18.8 mmHg. Aortic valve Vmax  measures 2.92 m/s.   7. The inferior vena cava is dilated in  size with >50% respiratory  variability, suggesting right atrial pressure of 8 mmHg.   Comparison(s): 2022 AV mean gradient 17 mmHg.     Neuro/Psych negative neurological ROS  negative psych ROS   GI/Hepatic ,GERD  Medicated,,(+) Cirrhosis         Endo/Other  diabetes    Renal/GU negative Renal ROS  negative genitourinary   Musculoskeletal  (+) Arthritis , Osteoarthritis,    Abdominal Normal abdominal exam  (+)   Peds  Hematology   Anesthesia Other Findings   Reproductive/Obstetrics                              Anesthesia Physical Anesthesia Plan  ASA: 3  Anesthesia Plan: General   Post-op Pain Management:    Induction: Intravenous  PONV Risk Score and Plan: 1 and Ondansetron, Dexamethasone and Treatment may vary due to age or medical condition  Airway Management Planned: Mask and Oral ETT  Additional Equipment: Arterial line  Intra-op Plan:   Post-operative Plan: Possible Post-op intubation/ventilation  Informed Consent: I have reviewed the patients History and Physical, chart, labs and discussed the procedure including the risks, benefits and alternatives for the proposed anesthesia with the patient or authorized representative who has indicated his/her understanding and acceptance.     Dental advisory given  Plan Discussed with: CRNA  Anesthesia Plan Comments:         Anesthesia Quick Evaluation

## 2023-08-28 NOTE — Sedation Documentation (Addendum)
 Patient moved to table by staff , secured, hooked to monitors, and now under the care of anesthesia. Please see charting in vitals per CRNA.

## 2023-08-28 NOTE — H&P (Signed)
 Chief Complaint: Patient was seen in consultation today for portal hypertension/ gastric varices-- for Transjugular Intrahepatic Portal system Shunt Creation and Balloon- occluded Retrograde Transvenous Obliteration  Referring Physician(s): Dr JONETTA Eartha Flavors  Supervising Physician: Jennefer Rover  Patient Status: [name] - Out-pt  History of Present Illness: Ricky Rios is a 73 y.o. male   FULL Code status per pt  Known to IR Dx portal hypertension  Consultation with Dr Jennefer 08/09/22: DM, COPD, HTN, sleep apnea, colonic polyps and NASH cirrhosis. The patient is also a smoker and had a lung cancer screening CT in July 2024. This showed cirrhosis with splenomegaly and vascular collateralization in the left abdomen. His GI doctor brought him in for an EDG 06/01/23 which revealed Grade II esophageal and gastric varices with no stigmata of recent bleeding. During a clinic visit the patient's wife reported that the patient was having trouble with his memory and he was started on lactulose .  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 He presents today alongside his wife. He denies any prior episodes of hematemesis. He does have occasional melena, however confounded by known hemorrhoids.   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  No prior episodes of hematemesis or significant gastrointestinal hemorrhage. 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     We discussed in depth the rationale of transvenous obliteration of gastric varices in addition to TIPS creation.  We discussed that obliteration of gastric would significantly decrease chances of fatal gastrointestinal  hemorrhage, however would not improve his underlying portal hypertension, which would be corrected with TIPS.  We discussed that TIPS introduces another portosystemic shunt, increasing his risk of worsening hepatic encephalopathy, but with embolization of the gastrorenal shunt we expect him to have no greate encephalopathy than he does now, which is minimal. He and his wife are in favor of the more comprehensive approach of combined TIPS + retrograde transvenous obliteration of gastric varix.        Risks and benefits of TIPS, BRTO and/or additional variceal embolization were discussed with the patient and/or the patient's family including, but not limited to, infection, bleeding, damage to adjacent structures, worsening hepatic and/or cardiac function, worsening and/or the development of altered mental status/encephalopathy, non-target embolization and death.  All of the patient's questions were answered, patient is agreeable to proceed.      Plan for combined TIPS + retrograde transvenous obliteration of gastric varix at [facility] with general anesthesia.  Plan for inpatient only procedure overnight observation.  Plan for Foley + standard TIPS antibiotic prophylaxis.   Pt here today for TIPS/BRTO procedure in IR  Past Medical History:  Diagnosis Date   Allergy    Phreesia 03/01/2020   Arthritis    Phreesia 03/01/2020   Cataract    Phreesia 03/01/2020   Chest pain    Diagnostic cardiac cath October 2012. Normal coronary arteries, left ventricular function   COPD (chronic obstructive pulmonary disease) (HCC)    Phreesia 03/01/2020   DDD (degenerative disc disease), lumbar    Diabetes mellitus    Diabetes mellitus without complication (HCC)    Phreesia 03/01/2020   Heart murmur    History of back surgery    History of neck surgery  HLD (hyperlipidemia) 12/09/2012   Hyperlipidemia    Phreesia 03/01/2020   Hypertension    Obesity    Right knee injury    Sleep apnea    Smoker      Past Surgical History:  Procedure Laterality Date   APPENDECTOMY     BACK SURGERY     for ruptured disc lumbar area   BIOPSY  06/01/2023   Procedure: BIOPSY;  Surgeon: Eartha Angelia Sieving, MD;  Location: [facility];  Service: Gastroenterology;;   CARDIAC CATHETERIZATION  Oct 2012   nml coronary arteries, aorta, LV fcn   COLONOSCOPY WITH PROPOFOL  N/A 01/31/2023   Procedure: COLONOSCOPY WITH PROPOFOL ;  Surgeon: Eartha Angelia Sieving, MD;  Location: [facility];  Service: Gastroenterology;  Laterality: N/A;  12:45pm;ASA 1-2   ESOPHAGOGASTRODUODENOSCOPY (EGD) WITH PROPOFOL  N/A 06/01/2023   Procedure: ESOPHAGOGASTRODUODENOSCOPY (EGD) WITH PROPOFOL ;  Surgeon: Eartha Angelia Sieving, MD;  [facility]: [facility];  Service: Gastroenterology;  Laterality: N/A;  9:15AM;ASA 3   EYE SURGERY N/A    Phreesia 03/01/2020   HEMOSTASIS CLIP PLACEMENT  01/31/2023   Procedure: HEMOSTASIS CLIP PLACEMENT;  Surgeon: Eartha Angelia Sieving, MD;  [facility]: [facility];  Service: Gastroenterology;;   I & D EXTREMITY Right 02/06/2013   Procedure: IRRIGATION AND DEBRIDEMENT INDEX FINGER;  Surgeon: Franky JONELLE Curia, MD;  Location: [facility];  Service: Orthopedics;  Laterality: Right;   IR RADIOLOGIST EVAL & MGMT  06/20/2023   IR RADIOLOGIST EVAL & MGMT  08/10/2023   NECK SURGERY     for ruptured disc   POLYPECTOMY  01/31/2023   Procedure: POLYPECTOMY;  Surgeon: Eartha Angelia Sieving, MD;  Location: [facility];  Service: Gastroenterology;;   SPINE SURGERY N/A    Phreesia 03/01/2020   TONSILLECTOMY      Allergies: Lipitor [atorvastatin calcium], Sulfa antibiotics, and Zocor [simvastatin - high dose]  Medications: Prior to Admission medications   Medication Sig Start Date End Date Taking? Authorizing Provider  acetaminophen  (TYLENOL ) 500 MG tablet Take 1,000 mg by mouth every 8 (eight) hours as needed for moderate pain.    [provider]  albuterol  (VENTOLIN  HFA) 108 (90 Base)  MCG/ACT inhaler Inhale 1-2 puffs into the lungs every 4 (four) hours as needed for wheezing or shortness of breath.    [provider]  carvedilol  (COREG ) 3.125 MG tablet Take 1 tablet (3.125 mg total) by mouth 2 (two) times daily with a meal. 06/01/23   Eartha Angelia, Sieving, MD  Clobetasol Propionate 0.05 % lotion Apply 1 Application topically 2 (two) times daily as needed (psoriasis). 11/24/22   [provider]  ezetimibe  (ZETIA ) 10 MG tablet TAKE 1 TABLET EVERY DAY 08/30/21   Duanne Butler DASEN, MD  ipratropium (ATROVENT ) 0.03 % nasal spray Place 2 sprays into both nostrils 2 (two) times daily. 08/16/23   [provider]  lactulose  (CHRONULAC ) 10 GM/15ML solution Take 30 mLs (20 g total) by mouth 2 (two) times daily. Patient taking differently: Take 20 g by mouth daily as needed for moderate constipation. 05/10/23   Eartha Angelia Sieving, MD  omeprazole  (PRILOSEC) 40 MG capsule Take 1 capsule (40 mg total) by mouth daily. 06/01/23   Eartha Angelia Sieving, MD  vitamin E  180 MG (400 UNITS) capsule Take 400 Units by mouth daily.    [provider]  zinc  gluconate 50 MG tablet Take 50 mg by mouth daily.    [provider]     Family History  Problem Relation Age of Onset  Dementia Mother    Heart disease Father     Social History   Socioeconomic History   Marital status: Married    Spouse name: Vernell   Number of children: 2   Years of education: Not on file   Highest education level: Not on file  Occupational History   Occupation: Disabled    Employer: [employer]  Tobacco Use   Smoking status: Every Day    Current packs/day: 2.00    Types: Cigarettes   Smokeless tobacco: Never   Tobacco comments:    Vaping more then cigs  Vaping Use   Vaping status: Former   Quit date: 07/24/2021  Substance and Sexual Activity   Alcohol use: No   Drug use: No   Sexual activity: Not on file  Other Topics Concern   Not on file   Social History Narrative   1 step-son. 1 daughter.    4 grandchildren.   Social Drivers of Health   Financial Resource Strain: Medium Risk (06/02/2021)   Overall Financial Resource Strain (CARDIA)    Difficulty of Paying Living Expenses: Somewhat hard  Food Insecurity: Food Insecurity Present (06/02/2021)   Hunger Vital Sign    Worried About Running Out of Food in the Last Year: Sometimes true    Ran Out of Food in the Last Year: Sometimes true  Transportation Needs: No Transportation Needs (06/02/2021)   PRAPARE - Administrator, Civil Service (Medical): No    Lack of Transportation (Non-Medical): No  Physical Activity: Insufficiently Active (06/02/2021)   Exercise Vital Sign    Days of Exercise per Week: 2 days    Minutes of Exercise per Session: 10 min  Stress: Stress Concern Present (06/02/2021)   [employer] of Occupational Health - Occupational Stress Questionnaire    Feeling of Stress : To some extent  Social Connections: Socially Integrated (06/02/2021)   Social Connection and Isolation Panel [NHANES]    Frequency of Communication with Friends and Family: More than three times a week    Frequency of Social Gatherings with Friends and Family: Once a week    Attends Religious Services: 1 to 4 times per year    Active Member of [employer] or Organizations: Yes    Attends Banker Meetings: 1 to 4 times per year    Marital Status: Married    Review of Systems: A 12 point ROS discussed and pertinent positives are indicated in the HPI above.  All other systems are negative.  Vital Signs: BP (!) 150/64 (BP Location: Right Arm)   Pulse (!) 59   Temp 98.2 F (36.8 C) (Oral)   Resp 18   Ht 6' (1.829 m)   Wt 257 lb (116.6 kg)   SpO2 94%   BMI 34.86 kg/m   Advance Care Plan: The advanced care plan/surrogate decision maker was discussed at the time of visit and documented in the medical record.    Physical Exam Vitals reviewed.  HENT:      Mouth/Throat:     Mouth: Mucous membranes are moist.  Cardiovascular:     Rate and Rhythm: Normal rate and regular rhythm.     Heart sounds: Murmur heard.  Pulmonary:     Effort: Pulmonary effort is normal.     Breath sounds: Normal breath sounds. No wheezing.  Abdominal:     General: There is distension.     Palpations: Abdomen is soft.     Tenderness: There is no abdominal tenderness.  Musculoskeletal:        General: Normal range of motion.     Right lower leg: Edema present.     Left lower leg: Edema present.     Comments: Minimal edema  Skin:    General: Skin is warm.  Neurological:     Mental Status: He is alert and oriented to person, place, and time.     Comments: Pt answers all questions correctly and appropriately Wanted wife to sign consent no matter  Psychiatric:        Behavior: Behavior normal.     Imaging: VAS US  CAROTID Result Date: 08/18/2023 Carotid Arterial Duplex Study Patient Name:  CARLON CHALOUX [facility]  Date of Exam:   08/17/2023 Medical Rec #: [id number]           Accession #:    [id number] Date of Birth: [birth year]           Patient Gender: M Patient Age:   45 years Exam Location:  [facility] Procedure:      VAS US  CAROTID Referring Phys: DORN BERRY --------------------------------------------------------------------------------  Indications:       Bilateral bruits. Risk Factors:      Hypertension, hyperlipidemia, Diabetes, current smoker. Comparison Study:  None Performing Technologist: [employer] BS, RVT, RDCS  Examination Guidelines: A complete evaluation includes B-mode imaging, spectral Doppler, color Doppler, and power Doppler as needed of all accessible portions of each vessel. Bilateral testing is considered an integral part of a complete examination. Limited examinations for reoccurring indications may be performed as noted.  Right Carotid Findings: +----------+--------+--------+--------+------------------+------------------+           PSV cm/sEDV  cm/sStenosisPlaque DescriptionComments           +----------+--------+--------+--------+------------------+------------------+ CCA Prox  97      2                                                    +----------+--------+--------+--------+------------------+------------------+ CCA Distal51      12                                intimal thickening +----------+--------+--------+--------+------------------+------------------+ ICA Prox  113     27              homogeneous                          +----------+--------+--------+--------+------------------+------------------+ ICA Mid   152     40      40-59%  homogeneous                          +----------+--------+--------+--------+------------------+------------------+ ICA Distal80      27                                                   +----------+--------+--------+--------+------------------+------------------+ ECA       79      7                                                    +----------+--------+--------+--------+------------------+------------------+ +----------+--------+-------+----------------+-------------------+  PSV cm/sEDV cmsDescribe        Arm Pressure (mmHG) +----------+--------+-------+----------------+-------------------+ Subclavian129            Multiphasic, [id number]                 +----------+--------+-------+----------------+-------------------+ +---------+--------+--+--------+--+---------+ VertebralPSV cm/s41EDV cm/s10Antegrade +---------+--------+--+--------+--+---------+  Left Carotid Findings: +----------+-------+--------+--------+-----------------------+-----------------+           PSV    EDV cm/sStenosisPlaque Description     Comments                    cm/s                                                            +----------+-------+--------+--------+-----------------------+-----------------+ CCA Prox  64     7                                                         +----------+-------+--------+--------+-----------------------+-----------------+ CCA Mid                                                 intimal                                                                   thickening        +----------+-------+--------+--------+-----------------------+-----------------+ CCA Distal64     15                                     intimal                                                                   thickening        +----------+-------+--------+--------+-----------------------+-----------------+ ICA Prox  66     14      1-39%   irregular and                                                             homogeneous                              +----------+-------+--------+--------+-----------------------+-----------------+ ICA Mid   75     18                                                       +----------+-------+--------+--------+-----------------------+-----------------+  ICA Distal78     22                                                       +----------+-------+--------+--------+-----------------------+-----------------+ ECA       99     11                                                       +----------+-------+--------+--------+-----------------------+-----------------+ +----------+--------+--------+----------------+-------------------+           PSV cm/sEDV cm/sDescribe        Arm Pressure (mmHG) +----------+--------+--------+----------------+-------------------+ [id number]             Multiphasic, [id number]                 +----------+--------+--------+----------------+-------------------+ +---------+--------+--+--------+--+---------+ VertebralPSV cm/s81EDV cm/s12Antegrade +---------+--------+--+--------+--+---------+   Summary: Right Carotid: Velocities in the right ICA are consistent with a 40-59%                stenosis. Left Carotid: Velocities in the left ICA are  consistent with a 1-39% stenosis. Vertebrals:  Bilateral vertebral arteries demonstrate antegrade flow. Subclavians: Normal flow hemodynamics were seen in bilateral subclavian              arteries. *See table(s) above for measurements and observations. Suggest follow up study in 12 months. Study reveals bilateral homogeneous plaque of moderate degree. Electronically signed by Erick Bergamo on 08/18/2023 at 5:32:07 PM.    Final    IR Radiologist Eval & Mgmt Result Date: 08/10/2023 EXAM: ESTABLISHED PATIENT OFFICE VISIT CHIEF COMPLAINT: See Epic note. HISTORY OF PRESENT ILLNESS: See Epic note. REVIEW OF SYSTEMS: See Epic note. PHYSICAL EXAMINATION: See Epic note. ASSESSMENT AND PLAN: See Epic note. Ester Sides, MD [facility] Electronically Signed   By: Ester Sides M.D.   On: 08/10/2023 15:53    Labs:  CBC: Recent Labs    05/10/23 1028 08/28/23 1107  WBC 5.2 5.7  HGB 13.1* 13.9  HCT 39.6 41.8  PLT 92* 70*    COAGS: Recent Labs    05/10/23 1028 08/28/23 1107  INR 1.1 1.2    BMP: Recent Labs    05/10/23 1028 07/12/23 1336 08/28/23 1107  NA 141  --  138  K 3.9  --  3.7  CL 108  --  105  CO2 24  --  24  GLUCOSE 120*  --  95  BUN 12  --  16  CALCIUM 9.1  --  8.8*  CREATININE 0.98 1.20 1.07  GFRNONAA  --   --  >60    LIVER FUNCTION TESTS: Recent Labs    05/10/23 1028 08/28/23 1107  BILITOT 0.7 0.9  AST 25 24  ALT 15 17  ALKPHOS  --  61  PROT 7.0 6.5  ALBUMIN   --  3.2*    TUMOR MARKERS: Recent Labs    05/10/23 1028  AFPTM 4.4    Assessment and Plan:  Scheduled for TIPS/BRTO Risks and benefits of TIPS, BRTO and/or additional variceal embolization were discussed with the patient and/or the patient's family including, but not limited to, infection, bleeding, damage to adjacent structures, worsening hepatic and/or  cardiac function, worsening and/or the development of altered mental  status/encephalopathy, non-target embolization and death.   This interventional procedure involves the use of X-rays and because of the nature of the planned procedure, it is possible that we will have prolonged use of X-ray fluoroscopy.  Potential radiation risks to you include (but are not limited to) the following: - A slightly elevated risk for cancer  several years later in life. This risk is typically less than 0.5% percent. This risk is low in comparison to the normal incidence of human cancer, which is 33% for women and 50% for men according to the American Cancer Society. - Radiation induced injury can include skin redness, resembling a rash, tissue breakdown / ulcers and hair loss (which can be temporary or permanent).   The likelihood of either of these occurring depends on the difficulty of the procedure and whether you are sensitive to radiation due to previous procedures, disease, or genetic conditions.   IF your procedure requires a prolonged use of radiation, you will be notified and given written instructions for further action.  It is your responsibility to monitor the irradiated area for the 2 weeks following the procedure and to notify your physician if you are concerned that you have suffered a radiation induced injury.    All of the patient's questions were answered, patient is agreeable to proceed.  Consent signed and in chart.  Planned for overnight observation DC in am if stable Pt and wife aware and agreeable  Thank you for this interesting consult.  I greatly enjoyed meeting Ricky Rios and look forward to participating in their care.  A copy of this report was sent to the requesting provider on this date.  Electronically Signed: Sharlet DELENA Candle, PA-C 08/28/2023, 12:03 PM   I spent a total of    25 Minutes in face to face in clinical consultation, greater than 50% of which was counseling/coordinating care for TIPS/BRTO

## 2023-08-28 NOTE — OR PostOp (Incomplete)
PACU TO INPATIENT HANDOFF REPORT  Name/Age/Gender Ricky Rios 73 y.o. male  Code Status    Code Status Orders  (From admission, onward)           Start     Ordered   08/28/23 1613  Full code  (Code Status)  Continuous       Question:  By:  Answer:  Consent: discussion documented in EHR   08/28/23 1612           Code Status History     This patient has a current code status but no historical code status.       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 Normal coronary arteries, left ventricular function   COPD (chronic obstructive pulmonary disease) (HCC)    Phreesia 03/01/2020   DDD (degenerative disc disease), lumbar    Diabetes mellitus    Diabetes mellitus without complication (HCC)    Phreesia 03/01/2020   Heart murmur    History of back surgery    History of neck surgery    HLD (hyperlipidemia) 12/09/2012   Hyperlipidemia    Phreesia 03/01/2020   Hypertension    Obesity    Right knee injury    Sleep apnea    Smoker     Allergies Allergies  Allergen Reactions   Lipitor [Atorvastatin Calcium] Other (See Comments)    shaking   Sulfa Antibiotics Other (See Comments)    unknown   Zocor [Simvastatin - High Dose] Other (See Comments)    shaking    IV Location/Drains/Wounds Patient Lines/Drains/Airways Status     Active Line/Drains/Airways     Name Placement date Placement time Site Days   Peripheral IV 08/28/23 18 G Left Hand 08/28/23  1045  Hand  less than 1   Peripheral IV 08/28/23 16 G Left Hand 08/28/23  1236  Hand  less than  1   Incision (Closed) 08/28/23 Groin Right 08/28/23  1453  -- less than 1   Incision (Closed) 08/28/23 Throat Right 08/28/23  1453  -- less than 1            Labs/Imaging Results for orders placed or performed during the hospital encounter of 08/28/23 (from the past 48 hours)  Type and screen [facility]     Status: None   Collection Time: 08/28/23 10:45 AM  Result Value Ref Range   ABO/RH(D) O POS    Antibody Screen NEG    Sample Expiration      08/31/2023,2359 Performed at [facility], [address], [place], [place] [address]   ABO/Rh     Status: None   Collection Time: 08/28/23 10:50 AM  Result Value Ref Range   ABO/RH(D)      O POS Performed at [facility], [address], [place], [place] [address]   CBC     Status: Abnormal   Collection Time: 08/28/23 11:07 AM  Result Value Ref Range   WBC 5.7 4.0 - 10.5 K/uL   RBC 4.60 4.22 - 5.81 MIL/uL   Hemoglobin 13.9 13.0 -  17.0 g/dL   HCT 16.1 09.6 - 04.5 %   MCV 90.9 80.0 - 100.0 fL   MCH 30.2 26.0 - 34.0 pg   MCHC 33.3 30.0 - 36.0 g/dL   RDW 40.9 81.1 - 91.4 %   Platelets 70 (L) 150 - 400 K/uL    Comment: Immature Platelet Fraction may be clinically indicated, consider ordering this additional test [id number] REPEATED TO VERIFY PLATELET COUNT CONFIRMED BY SMEAR    nRBC 0.0 0.0 - 0.2 %    Comment: Performed at [facility], [address], [place], [place] [address]  Protime-INR     Status: None   Collection Time: 08/28/23 11:07 AM  Result Value Ref Range   Prothrombin Time 15.1 11.4 - 15.2 seconds   INR 1.2 0.8 - 1.2    Comment: (NOTE) INR goal varies based on device and disease states. Performed at [facility], [address] [place], [place] [address]   Comprehensive metabolic panel     Status: Abnormal   Collection Time: 08/28/23 11:07 AM  Result Value Ref Range   Sodium 138 135 - 145 mmol/L   Potassium 3.7 3.5 - 5.1 mmol/L   Chloride 105 98 - 111 mmol/L    CO2 24 22 - 32 mmol/L   Glucose, Bld 95 70 - 99 mg/dL    Comment: Glucose reference range applies only to samples taken after fasting for at least 8 hours.   BUN 16 8 - 23 mg/dL   Creatinine, Ser 6.29 0.61 - 1.24 mg/dL   Calcium 8.8 (L) 8.9 - 10.3 mg/dL   Total Protein 6.5 6.5 - 8.1 g/dL   Albumin 3.2 (L) 3.5 - 5.0 g/dL   AST 24 15 - 41 U/L   ALT 17 0 - 44 U/L   Alkaline Phosphatase 61 38 - 126 U/L   Total Bilirubin 0.9 0.0 - 1.2 mg/dL   GFR, Estimated >52 >84 mL/min    Comment: (NOTE) Calculated using the CKD-EPI Creatinine Equation (2021)    Anion gap 9 5 - 15    Comment: Performed at [facility], [address], [place], [place] [address]   No results found.  Pending Labs   Vitals/Pain Today's Vitals   08/28/23 1515 08/28/23 1530 08/28/23 1545 08/28/23 1600  BP: 123/60 122/66 138/67 135/62  Pulse: 74 69 66 63  Resp: 17 15 (!) 21 14  Temp: 98.6 F (37 C)     SpO2: 99% 94% 97% 97%  Weight:      Height:      PainSc: Asleep  5      Isolation Precautions @ISOLATION @  Administered Medications Periop Administered Meds from 08/28/2023 0958 to 08/28/2023 1612       Date/Time Order Dose Route Action Action by Comments    08/28/2023 1443 EST 0.9 %  sodium chloride infusion 0  Intravenous Stopped Sharyn Dross, CRNA --    08/28/2023 1225 EST 0.9 %  sodium chloride infusion -- Intravenous Restarted Sharyn Dross, CRNA --    08/28/2023 1224 EST 0.9 %  sodium chloride infusion -- Intravenous Lenetta Quaker, CRNA Switch to gravity    08/28/2023 1110 EST 0.9 %  sodium chloride infusion -- Intravenous New Bag/Given Johny Shears, RN --    08/28/2023 1348 EST albumin human 5 % solution 0  Intravenous Lind Guest, CRNA --    08/28/2023 1332 EST albumin human 5 % solution -- Intravenous New Bag/Given Sharyn Dross, CRNA --  08/28/2023 1259 EST albumin human 5 % solution 0  Intravenous Lind Guest, CRNA --    08/28/2023 1248 EST albumin human 5 % solution  -- Intravenous New Bag/Given Sharyn Dross, CRNA --    08/28/2023 1236 EST cefTRIAXone (ROCEPHIN) 2 g in sodium chloride 0.9 % 100 mL IVPB 2 g Intravenous New Bag/Given Sharyn Dross, CRNA --    08/28/2023 1038 EST chlorhexidine (PERIDEX) 0.12 % solution 15 mL 15 mL Mouth/Throat Given Johny Shears, RN --    08/28/2023 1248 EST dexamethasone (DECADRON) injection 5 mg Intravenous Given Sharyn Dross, CRNA --    08/28/2023 1334 EST ePHEDrine sulfate (PF) 5mg /mL syringe 5 mg Intravenous Given Sharyn Dross, CRNA --    08/28/2023 1309 EST ePHEDrine sulfate (PF) 5mg /mL syringe 5 mg Intravenous Given Sharyn Dross, CRNA --    08/28/2023 1232 EST fentaNYL citrate (PF) (SUBLIMAZE) injection 50 mcg Intravenous Given Sharyn Dross, CRNA --    08/28/2023 1225 EST fentaNYL citrate (PF) (SUBLIMAZE) injection 50 mcg Intravenous Given Sharyn Dross, CRNA --    08/28/2023 1438 EST iohexol (OMNIPAQUE) 300 MG/ML solution 100 mL 75 mL Intravenous Contrast Given Carpentieri, Christina E, RT --    08/28/2023 1441 EST iohexol (OMNIPAQUE) 300 MG/ML solution 100 mL 25 mL Intravenous Contrast Given Carpentieri, Christina E, RT --    08/28/2023 1443 EST lactated ringers infusion -- Intravenous Anesthesia Volume Adjustment Sharyn Dross, CRNA --    08/28/2023 1236 EST lactated ringers infusion -- Intravenous New Bag/Given Sharyn Dross, CRNA --    08/28/2023 1232 EST lidocaine 2% (20 mg/mL) 5 mL syringe 80 mg Intravenous Given Sharyn Dross, CRNA --    08/28/2023 1433 EST lipiodol ultrafluid injection 10 mL 2 mL Intra-arterial Contrast Given Carpentieri, Christina E, RT --    08/28/2023 1441 EST ondansetron (ZOFRAN) injection 4 mg Intravenous Given Sharyn Dross, CRNA --    08/28/2023 1038 EST Oral care mouth rinse -- Mouth Rinse See Alternative Johny Shears, RN --    08/28/2023 1445 EST phenylephrine (NEO-SYNEPHRINE) 20mg /NS premix infusion 0 mcg/min Intravenous Lind Guest, CRNA --    08/28/2023 1338 EST phenylephrine  (NEO-SYNEPHRINE) 20mg /NS premix infusion 20 mcg/min Intravenous Rate/Dose Change Sharyn Dross, CRNA --    08/28/2023 1312 EST phenylephrine (NEO-SYNEPHRINE) 20mg /NS premix infusion 40 mcg/min Intravenous Rate/Dose Change Sharyn Dross, CRNA --    08/28/2023 1309 EST phenylephrine (NEO-SYNEPHRINE) 20mg /NS premix infusion 60 mcg/min Intravenous Rate/Dose Change Sharyn Dross, CRNA --    08/28/2023 1244 EST phenylephrine (NEO-SYNEPHRINE) 20mg /NS premix infusion 40 mcg/min Intravenous New Bag/Given Sharyn Dross, CRNA --    08/28/2023 1334 EST PHENYLephrine 80 mcg/ml in normal saline Adult IV Push Syringe (For Blood Pressure Support) 80 mcg Intravenous Given Sharyn Dross, CRNA --    08/28/2023 1244 EST PHENYLephrine 80 mcg/ml in normal saline Adult IV Push Syringe (For Blood Pressure Support) 160 mcg Intravenous Given Sharyn Dross, CRNA --    08/28/2023 1232 EST propofol (DIPRIVAN) 10 mg/mL bolus/IV push 150 mg Intravenous Given Sharyn Dross, CRNA --    08/28/2023 1418 EST rocuronium (ZEMURON) injection 10 mg Intravenous Given Sharyn Dross, CRNA --    08/28/2023 1332 EST rocuronium (ZEMURON) injection 20 mg Intravenous Given Sharyn Dross, CRNA --    08/28/2023 1241 EST rocuronium (ZEMURON) injection 50 mg Intravenous Given Sharyn Dross, CRNA --    08/28/2023 1314 EST sodium chloride 0.9 % with cefTRIAXone (ROCEPHIN) ADS Med --  Override pull for Anesthesia Sharyn Dross, CRNA Filed by anesthesia medication administration from  clinical order [id number]    08/28/2023 1232 EST succinylcholine (ANECTINE) syringe 120 mg Intravenous Given Sharyn Dross, CRNA --    08/28/2023 1454 EST sugammadex sodium (BRIDION) injection 400 mg Intravenous Given Sharyn Dross, CRNA --       Mobility {Mobility:20148}

## 2023-08-28 NOTE — Sedation Documentation (Signed)
Right Atrium Mean : 

## 2023-08-28 NOTE — Progress Notes (Signed)
Pt arrived to 6 north room 31 alert and oriented x4. Pain level 4/10. Right neck gauze with scant drainage noted. Right groin gauze is clean dry and intact. Bed in lowest position. Call light in reach. Will continue to monitor pt.

## 2023-08-28 NOTE — Procedures (Signed)
 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  Middle hepatic vein to left portal vein 7+2 cm Viatorr.  Portosystemic gradient decreased from 10 mmHg to 2 mmHg, likely erroneous given variceal/shunt embolizations.  Coil embolization of left gastric and short gastric veins.  Coil assisted RTO. 10 Fr right internal jugular and right CFV accesses.  Complications: None immediate  Estimated Blood Loss: < 5 mL  Recommendations: Admit to IR for overnight observation. Resume home medications. Initiate lactulose . CBC, CMP, INR in am.  Ester Sides, MD Pager: [phone] Clinic: [phone]

## 2023-08-28 NOTE — Progress Notes (Signed)
 Dinner tray ordered for pt

## 2023-08-28 NOTE — Anesthesia Procedure Notes (Signed)
 Procedure Name: Intubation Date/Time: 08/28/2023 12:35 PM  Performed by: Christopher Comings, CRNAPre-anesthesia Checklist: Patient identified, Emergency Drugs available, Suction available and Patient being monitored Patient Re-evaluated:Patient Re-evaluated prior to induction Oxygen Delivery Method: Circle system utilized Preoxygenation: Pre-oxygenation with 100% oxygen Induction Type: IV induction Ventilation: Mask ventilation without difficulty Laryngoscope Size: Mac and 4 Grade View: Grade I Tube type: Oral Tube size: 7.5 mm Number of attempts: 1 Airway Equipment and Method: Stylet and Oral airway Placement Confirmation: ETT inserted through vocal cords under direct vision, positive ETCO2 and breath sounds checked- equal and bilateral Secured at: 23 cm Tube secured with: Tape Dental Injury: Teeth and Oropharynx as per pre-operative assessment

## 2023-08-28 NOTE — Sedation Documentation (Signed)
Main Portal Vein Post Tips: 30 mmHg

## 2023-08-28 NOTE — Sedation Documentation (Signed)
 Patient transported to recovery area via stretcher with CRNA. Bedside report given to RN. Femoral site assessed - Level 0, no hematoma, dressing is clean, dry, and intact. RIGHT jugular site assessed - Level 0, no hematoma, dressing is clean, dry, and intact.

## 2023-08-28 NOTE — Anesthesia Procedure Notes (Signed)
 Arterial Line Insertion Start/End2/10/2023 11:45 AM, 08/28/2023 11:50 AM Performed by: Dorethea Cordella SQUIBB, DO, Christopher Comings, CRNA, CRNA  Patient location: Pre-op. Preanesthetic checklist: patient identified, IV checked, site marked, risks and benefits discussed, surgical consent, monitors and equipment checked, pre-op evaluation, timeout performed and anesthesia consent Lidocaine  1% used for infiltration Left, radial was placed Catheter size: 20 G Hand hygiene performed  and maximum sterile barriers used   Attempts: 2 Procedure performed without using ultrasound guided technique. Following insertion, dressing applied. Post procedure assessment: normal and unchanged  Patient tolerated the procedure well with no immediate complications.

## 2023-08-28 NOTE — Sedation Documentation (Signed)
Right Atrium Post Tips: 28 mmHg

## 2023-08-28 NOTE — Transfer of Care (Signed)
 Immediate Anesthesia Transfer of Care Note  Patient: Ricky Rios  Procedure(s) Performed: TRANS-JUGULAR INTRAHEPATIC PORTAL SHUNT (TIPS)  Patient Location: PACU  Anesthesia Type:General  Level of Consciousness: awake, alert , and oriented  Airway & Oxygen Therapy: Patient Spontanous Breathing and Patient connected to face mask oxygen  Post-op Assessment: Report given to RN and Post -op Vital signs reviewed and stable  Post vital signs: Reviewed and stable  Last Vitals:  Vitals Value Taken Time  BP 123/60 08/28/23 1515  Temp 37 C 08/28/23 1515  Pulse 74 08/28/23 1516  Resp 20 08/28/23 1516  SpO2 95 % 08/28/23 1516  Vitals shown include unfiled device data.  Last Pain:  Vitals:   08/28/23 1034  PainSc: 0-No pain         Complications: There were no known notable events for this encounter.

## 2023-08-28 NOTE — Progress Notes (Signed)
   08/28/23 1641  Vitals  Temp 98.4 F (36.9 C)  Temp Source Oral  BP (!) 141/69  MAP (mmHg) 91  BP Location Right Arm  BP Method Automatic  Patient Position (if appropriate) Lying  Pulse Rate 66  Pulse Rate Source Monitor  Resp 16  Level of Consciousness  Level of Consciousness Alert  MEWS COLOR  MEWS Score Color Green  Oxygen Therapy  SpO2 97 %  O2 Device Room Air  Pain Assessment  Pain Scale 0-10  Pain Score 4  MEWS Score  MEWS Temp 0  MEWS Systolic 0  MEWS Pulse 0  MEWS RR 0  MEWS LOC 0  MEWS Score 0

## 2023-08-28 NOTE — Sedation Documentation (Addendum)
Main Portal Vein: 35 mmHg

## 2023-08-29 ENCOUNTER — Encounter (HOSPITAL_COMMUNITY): Payer: Self-pay | Admitting: Interventional Radiology

## 2023-08-29 ENCOUNTER — Inpatient Hospital Stay (HOSPITAL_COMMUNITY): Payer: Medicare HMO

## 2023-08-29 LAB — CBC
HCT: 37.9 % — ABNORMAL LOW (ref 39.0–52.0)
Hemoglobin: 12.4 g/dL — ABNORMAL LOW (ref 13.0–17.0)
MCH: 30 pg (ref 26.0–34.0)
MCHC: 32.7 g/dL (ref 30.0–36.0)
MCV: 91.5 fL (ref 80.0–100.0)
Platelets: 74 10*3/uL — ABNORMAL LOW (ref 150–400)
RBC: 4.14 MIL/uL — ABNORMAL LOW (ref 4.22–5.81)
RDW: 15.1 % (ref 11.5–15.5)
WBC: 7.9 10*3/uL (ref 4.0–10.5)
nRBC: 0 % (ref 0.0–0.2)

## 2023-08-29 LAB — COMPREHENSIVE METABOLIC PANEL
ALT: 17 U/L (ref 0–44)
AST: 26 U/L (ref 15–41)
Albumin: 3.1 g/dL — ABNORMAL LOW (ref 3.5–5.0)
Alkaline Phosphatase: 55 U/L (ref 38–126)
Anion gap: 11 (ref 5–15)
BUN: 10 mg/dL (ref 8–23)
CO2: 21 mmol/L — ABNORMAL LOW (ref 22–32)
Calcium: 9 mg/dL (ref 8.9–10.3)
Chloride: 103 mmol/L (ref 98–111)
Creatinine, Ser: 0.88 mg/dL (ref 0.61–1.24)
GFR, Estimated: >60 mL/min (ref >60)
Glucose, Bld: 103 mg/dL — ABNORMAL HIGH (ref 70–99)
Potassium: 4.2 mmol/L (ref 3.5–5.1)
Sodium: 135 mmol/L (ref 135–145)
Total Bilirubin: 1.1 mg/dL (ref 0.0–1.2)
Total Protein: 6.2 g/dL — ABNORMAL LOW (ref 6.5–8.1)

## 2023-08-29 LAB — PROTIME-INR
INR: 1.3 — ABNORMAL HIGH (ref 0.8–1.2)
Prothrombin Time: 16.4 s — ABNORMAL HIGH (ref 11.4–15.2)

## 2023-08-29 NOTE — Discharge Summary (Signed)
 Patient ID: Ricky Rios MRN: [id number] DOB/AGE: [birth year] 73 y.o.  Admit date: 08/28/2023 Discharge date: 08/29/2023  Supervising Physician: Jennefer Rover  Patient Status: [facility] - In-pt  Admission Diagnoses: Portal hypertension with gastric varices  Discharge Diagnoses:  Principal Problem:   Cirrhosis (HCC) Active Problems:   S/P TIPS (transjugular intrahepatic portosystemic shunt)   Discharged Condition: good  Hospital Course:  Ricky Rios is a 73 y.o. male with a medical history significant for DM, COPD, HTN, sleep apnea, colonic polyps and NASH cirrhosis. The patient is also a smoker and had a lung cancer screening CT in July 2024. This showed cirrhosis with splenomegaly and vascular collateralization in the left abdomen. His GI doctor brought him in for an EDG 06/01/23 which revealed Grade II esophageal and gastric varices with no stigmata of recent bleeding. During a clinic visit the patient's wife reported that the patient was having trouble with his memory and he was started on lactulose . He was referred to Interventional Radiology to discuss possible TIPS with variceal embolization. He met with Dr. Jennefer and underwent additional work up/imaging before presenting to [facility] 08/28/23 for his procedure. He underwent TIPS creation with coil embolizations of the left gastric and short gastric veins. He also underwent coil-assisted retrograde transvenous obliteration of gastrorenal shunt and varices.   The patient was admitted for overnight observation and he had an uneventful evening. He was assessed this morning and he has ambulated, eaten, voided and had a bowel movement. He denies any significant pain or discomfort. His labs and vitals are within acceptable ranges. His wife was at the bedside and we discussed discharge and follow up instructions. We discussed the importance of continuing to take lactulose  and the signs/symptoms of hepatic encephalopathy. The patient will be  followed by the Portal Hypertension Clinic with several routine phone calls and he will meet with Dr. Jennefer in approximately one month. The patient has the number to the IR APP office at [facility] and he has been encouraged to call this number with any questions/concerns.  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 No prior episodes of hematemesis or significant gastrointestinal hemorrhage. 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 EXAM: 1. Ultrasound-guided access of the right internal jugular vein 2. Ultrasound-guided access of the right common femoral vein 3. Hepatic venogram 4. Intravascular ultrasound 5. Catheterization of the portal vein 6. Portal venous and central manometry 7. Portal venogram 8. Creation of a transhepatic portal vein to hepatic vein shunt 9. Selective catheterization and venography of the left gastric vein. 10. Coil embolization of the left gastric vein. 11. Selective catheterization and venography of the short gastric vein. 12. Coil embolization of the short gastric vein. 13. Selective catheterization and venography of left renal vein and gastrorenal shunt. 14. Coil assisted retrograde transvenous obliteration of gastrorenal shunt and varices. MEDICATIONS: As antibiotic prophylaxis, Rocephin  2 gm IV was ordered pre-procedure and administered intravenously within one hour of incision. ANESTHESIA/SEDATION: General - as administered by the Anesthesia department CONTRAST:  100 mL Omnipaque  300, intravenous FLUOROSCOPY TIME:  One thousand three hundred seven mGy COMPLICATIONS: None immediate. PROCEDURE: Informed written consent was obtained from the patient after  a thorough discussion of the procedural risks, benefits  and alternatives. All questions were addressed. Maximal Sterile Barrier Technique was utilized including caps, mask, sterile gowns, sterile gloves, sterile drape, hand hygiene and skin antiseptic. A timeout was performed prior to the initiation of the procedure. A preliminary ultrasound of the right groin was performed and demonstrates a patent right common femoral vein. A permanent ultrasound image was recorded. Using a combination of fluoroscopy and ultrasound, an access site was determined. A small dermatotomy was made at the planned puncture site. Using ultrasound guidance, access into the right common femoral vein was obtained with visualization of needle entry into the vessel using a standard micropuncture technique. A wire was advanced into the IVC insert all fascial dilation performed. An 8 French, 11 cm vascular sheath was placed into the external iliac vein. Through this access site, an 38 French Accunav ICE catheter was advanced with ease under fluoroscopic guidance to the level of the intrahepatic inferior vena cava. A preliminary ultrasound of the right neck was performed and demonstrates a patent internal jugular vein. A permanent ultrasound image was recorded. Using a combination of fluoroscopy and ultrasound, an access site was determined. A small dermatotomy was made at the planned puncture site. Using ultrasound guidance, access into the right internal jugular vein was obtained with visualization of needle entry into the vessel using a standard micropuncture technique. A wire was advanced into the IVC and serial fascial dilation performed. A 10 French tips sheath was placed into the internal jugular vein and advanced to the IVC. The jugular sheath was retracted into the right atrium and manometry was performed. A 5 French angled tip catheter was then directed into the middle hepatic vein. Hepatic venogram was performed. These images  demonstrated patent hepatic vein with no stenosis. The catheter was advanced to a wedge portion of the a patent vein over which the 10 French sheath was advanced into the middle hepatic vein. Using ICE ultrasound visualization of the catheter within middle hepatic vein as well as the portal anatomy was defined. A planned exit site from the hepatic vein and puncture site from the portal vein was placed into a single sonographic plane. Under direct ultrasound visualization, the ScorpionX needle was advanced into the central left portal vein. Hand injection of contrast confirmed position within the portal system. A Glidewire Advantage was then advanced into the splenic vein. A 5 French marking pigtail catheter was then advanced over the wire into the main portal vein and wire removed. Portal venogram was performed which demonstrated a patent portal vein. Portal manometry was then performed. The tract was then dilated to 8 mm with an 8 mm x 8 cm Athletis balloon. A 8-10 mm by 7 + 2 cm of Viatorr endograft was placed. No postop limits balloon molding was performed after placement of the shunt, right atrial and portal pressures were repeated. Completion portal venogram demonstrates a patent TIPS endograft. Pre-TIPS Mean Pressures (mmHg): Right atrium: 25 Portal vein: 35 Portosystemic gradient: 10 Post-TIPS Mean Pressures (mmHg): Right atrium:28 Portal vein: 30 Portosystemic gradient: 2 The left gastric vein was then selected within the MPA catheter and Glidewire Advantage. Left gastric venogram demonstrated significant hepatic fecal flow into the network of gastric and a few small esophageal varices. Coil embolization was then performed with as assortment of 0.035 detachable Azur coils. The splenic vein was then selected splenic venogram was performed. Short gastric vein was identified with hepatofugal flow into the large gastric varix. The short gastric vein was then selected and short gastric venogram was performed  which demonstrated large network of gastric varices draining via the gastro renal shunt into the left renal vein. Coil embolization was then performed with an assortment of 0.035 detachable Azur coils. The catheter and sheath within retracted from within the tips endograft into the right atrium. A guidewire was advanced into the inferior vena cava followed by advancement of the MPA catheter and 10 French sheath. The left renal vein was selected. Left renal venogram demonstrated patency and normal caliber. Using a coaxial 7 French Tourguide sheath, the gastro renal shunt was then cannulated. Venogram demonstrated persistent hepatofugal flow from the gastro renal shunt to the left renal vein. Therefore, occlusion balloon was inserted and inflated to occlude the central aspect of the gastro renal shunt. Contrast was injected which demonstrated contrast trapped solely within the gastric shunts and no evidence of phrenic or esophageal involvement. The balloon was released and the contrast flowed into the left renal vein demonstrating complete evacuation. The balloon was then reinflated and the gastric varix was obliterated with a 3:2:1 air:3% sotradecol :lipiodol  solution. This was followed by placement of an assortment of 0.035 detachable Azur coils. The balloon was slowly deflated in there is no evidence of sclerosis aunt migration. The balloon catheter was retracted and completion venogram demonstrated complete embolization of the gastro renal shunt with preserved patency of the left renal vein. The catheters and sheath were removed and manual compression was applied to the right internal jugular and right common femoral venous access sites until hemostasis was achieved. The patient was transferred to the PACU in stable condition. IMPRESSION: 1. Successful transjugular portosystemic shunt creation. 2. Portosystemic gradient of 10 mm Hg (absolute portal venous pressure 35 mm Hg) before shunt placement and 2 mm Hg  (absolute portal venous pressure 30 mm Hg) after shunt placement. These measurements are likely erroneous given complex native portosystemic shunting. 3. Technically successful coil embolization of the left and short gastric veins. 4. Technically successful coil assisted retrograde transvenous obliteration of large gastric varix. Ester Sides, MD [facility] Electronically Signed   By: Ester Sides M.D.   On: 08/28/2023 21:48   IR US  Guide Vasc Access Right Result Date: 08/28/2023 CLINICAL DATA:  73 year old male with a history of compensated MASH cirrhosis (CP A6, MELD 7) with portal hypertension 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 No prior episodes of hematemesis or significant gastrointestinal hemorrhage. 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 EXAM: 1. Ultrasound-guided access of the right internal jugular vein 2. Ultrasound-guided access of the right common femoral vein 3. Hepatic venogram 4. Intravascular ultrasound 5. Catheterization of the portal vein 6. Portal venous and central manometry 7. Portal venogram 8. Creation of a transhepatic portal vein to hepatic vein shunt 9. Selective catheterization and venography of the left gastric vein. 10. Coil embolization of the left gastric vein. 11. Selective catheterization and venography of the short gastric vein. 12. Coil embolization of the short gastric vein. 13. Selective catheterization and venography of left renal vein and gastrorenal shunt. 14. Coil assisted retrograde transvenous obliteration of gastrorenal shunt and varices. MEDICATIONS: As antibiotic prophylaxis, Rocephin  2 gm IV was ordered pre-procedure and administered intravenously within one hour of incision. ANESTHESIA/SEDATION: General - as administered by the Anesthesia department CONTRAST:   100 mL Omnipaque  300, intravenous FLUOROSCOPY TIME:  One thousand three hundred seven mGy COMPLICATIONS: None immediate. PROCEDURE: Informed written consent was  obtained from the patient after a thorough discussion of the procedural risks, benefits and alternatives. All questions were addressed. Maximal Sterile Barrier Technique was utilized including caps, mask, sterile gowns, sterile gloves, sterile drape, hand hygiene and skin antiseptic. A timeout was performed prior to the initiation of the procedure. A preliminary ultrasound of the right groin was performed and demonstrates a patent right common femoral vein. A permanent ultrasound image was recorded. Using a combination of fluoroscopy and ultrasound, an access site was determined. A small dermatotomy was made at the planned puncture site. Using ultrasound guidance, access into the right common femoral vein was obtained with visualization of needle entry into the vessel using a standard micropuncture technique. A wire was advanced into the IVC insert all fascial dilation performed. An 8 French, 11 cm vascular sheath was placed into the external iliac vein. Through this access site, an 68 French Accunav ICE catheter was advanced with ease under fluoroscopic guidance to the level of the intrahepatic inferior vena cava. A preliminary ultrasound of the right neck was performed and demonstrates a patent internal jugular vein. A permanent ultrasound image was recorded. Using a combination of fluoroscopy and ultrasound, an access site was determined. A small dermatotomy was made at the planned puncture site. Using ultrasound guidance, access into the right internal jugular vein was obtained with visualization of needle entry into the vessel using a standard micropuncture technique. A wire was advanced into the IVC and serial fascial dilation performed. A 10 French tips sheath was placed into the internal jugular vein and advanced to the IVC. The jugular sheath was  retracted into the right atrium and manometry was performed. A 5 French angled tip catheter was then directed into the middle hepatic vein. Hepatic venogram was performed. These images demonstrated patent hepatic vein with no stenosis. The catheter was advanced to a wedge portion of the a patent vein over which the 10 French sheath was advanced into the middle hepatic vein. Using ICE ultrasound visualization of the catheter within middle hepatic vein as well as the portal anatomy was defined. A planned exit site from the hepatic vein and puncture site from the portal vein was placed into a single sonographic plane. Under direct ultrasound visualization, the ScorpionX needle was advanced into the central left portal vein. Hand injection of contrast confirmed position within the portal system. A Glidewire Advantage was then advanced into the splenic vein. A 5 French marking pigtail catheter was then advanced over the wire into the main portal vein and wire removed. Portal venogram was performed which demonstrated a patent portal vein. Portal manometry was then performed. The tract was then dilated to 8 mm with an 8 mm x 8 cm Athletis balloon. A 8-10 mm by 7 + 2 cm of Viatorr endograft was placed. No postop limits balloon molding was performed after placement of the shunt, right atrial and portal pressures were repeated. Completion portal venogram demonstrates a patent TIPS endograft. Pre-TIPS Mean Pressures (mmHg): Right atrium: 25 Portal vein: 35 Portosystemic gradient: 10 Post-TIPS Mean Pressures (mmHg): Right atrium:28 Portal vein: 30 Portosystemic gradient: 2 The left gastric vein was then selected within the MPA catheter and Glidewire Advantage. Left gastric venogram demonstrated significant hepatic fecal flow into the network of gastric and a few small esophageal varices. Coil embolization was then performed with as assortment of 0.035 detachable Azur coils. The splenic vein was then selected splenic venogram  was performed. Short gastric vein was identified with hepatofugal flow into the large gastric varix.  The short gastric vein was then selected and short gastric venogram was performed which demonstrated large network of gastric varices draining via the gastro renal shunt into the left renal vein. Coil embolization was then performed with an assortment of 0.035 detachable Azur coils. The catheter and sheath within retracted from within the tips endograft into the right atrium. A guidewire was advanced into the inferior vena cava followed by advancement of the MPA catheter and 10 French sheath. The left renal vein was selected. Left renal venogram demonstrated patency and normal caliber. Using a coaxial 7 French Tourguide sheath, the gastro renal shunt was then cannulated. Venogram demonstrated persistent hepatofugal flow from the gastro renal shunt to the left renal vein. Therefore, occlusion balloon was inserted and inflated to occlude the central aspect of the gastro renal shunt. Contrast was injected which demonstrated contrast trapped solely within the gastric shunts and no evidence of phrenic or esophageal involvement. The balloon was released and the contrast flowed into the left renal vein demonstrating complete evacuation. The balloon was then reinflated and the gastric varix was obliterated with a 3:2:1 air:3% sotradecol :lipiodol  solution. This was followed by placement of an assortment of 0.035 detachable Azur coils. The balloon was slowly deflated in there is no evidence of sclerosis aunt migration. The balloon catheter was retracted and completion venogram demonstrated complete embolization of the gastro renal shunt with preserved patency of the left renal vein. The catheters and sheath were removed and manual compression was applied to the right internal jugular and right common femoral venous access sites until hemostasis was achieved. The patient was transferred to the PACU in stable condition.  IMPRESSION: 1. Successful transjugular portosystemic shunt creation. 2. Portosystemic gradient of 10 mm Hg (absolute portal venous pressure 35 mm Hg) before shunt placement and 2 mm Hg (absolute portal venous pressure 30 mm Hg) after shunt placement. These measurements are likely erroneous given complex native portosystemic shunting. 3. Technically successful coil embolization of the left and short gastric veins. 4. Technically successful coil assisted retrograde transvenous obliteration of large gastric varix. Ester Sides, MD [facility] Radiology Electronically Signed   By: Ester Sides M.D.   On: 08/28/2023 21:48   IR US  Guide Vasc Access Right Result Date: 08/28/2023 CLINICAL DATA:  73 year old male with a history of compensated MASH cirrhosis (CP A6, MELD 7) with portal hypertension 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 No prior episodes of hematemesis or significant gastrointestinal hemorrhage. 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 EXAM: 1. Ultrasound-guided access of the right internal jugular vein 2. Ultrasound-guided access of the right common femoral vein 3. Hepatic venogram 4. Intravascular ultrasound 5. Catheterization of the portal vein 6. Portal venous and central manometry 7. Portal venogram 8. Creation of a transhepatic portal vein to hepatic vein shunt 9. Selective catheterization and venography of the left gastric vein. 10. Coil embolization of the left gastric vein. 11. Selective catheterization and venography of the short gastric vein. 12. Coil embolization of the short gastric vein. 13. Selective catheterization and venography of left renal vein and gastrorenal shunt. 14. Coil assisted retrograde transvenous obliteration of gastrorenal shunt and varices. MEDICATIONS: As antibiotic prophylaxis,  Rocephin  2 gm IV was ordered pre-procedure and administered intravenously within one hour of incision. ANESTHESIA/SEDATION: General - as administered by the Anesthesia department CONTRAST:  100 mL Omnipaque  300, intravenous FLUOROSCOPY TIME:  One  thousand three hundred seven mGy COMPLICATIONS: None immediate. PROCEDURE: Informed written consent was obtained from the patient after a thorough discussion of the procedural risks, benefits and alternatives. All questions were addressed. Maximal Sterile Barrier Technique was utilized including caps, mask, sterile gowns, sterile gloves, sterile drape, hand hygiene and skin antiseptic. A timeout was performed prior to the initiation of the procedure. A preliminary ultrasound of the right groin was performed and demonstrates a patent right common femoral vein. A permanent ultrasound image was recorded. Using a combination of fluoroscopy and ultrasound, an access site was determined. A small dermatotomy was made at the planned puncture site. Using ultrasound guidance, access into the right common femoral vein was obtained with visualization of needle entry into the vessel using a standard micropuncture technique. A wire was advanced into the IVC insert all fascial dilation performed. An 8 French, 11 cm vascular sheath was placed into the external iliac vein. Through this access site, an 26 French Accunav ICE catheter was advanced with ease under fluoroscopic guidance to the level of the intrahepatic inferior vena cava. A preliminary ultrasound of the right neck was performed and demonstrates a patent internal jugular vein. A permanent ultrasound image was recorded. Using a combination of fluoroscopy and ultrasound, an access site was determined. A small dermatotomy was made at the planned puncture site. Using ultrasound guidance, access into the right internal jugular vein was obtained with visualization of needle entry into the vessel using a standard micropuncture technique.  A wire was advanced into the IVC and serial fascial dilation performed. A 10 French tips sheath was placed into the internal jugular vein and advanced to the IVC. The jugular sheath was retracted into the right atrium and manometry was performed. A 5 French angled tip catheter was then directed into the middle hepatic vein. Hepatic venogram was performed. These images demonstrated patent hepatic vein with no stenosis. The catheter was advanced to a wedge portion of the a patent vein over which the 10 French sheath was advanced into the middle hepatic vein. Using ICE ultrasound visualization of the catheter within middle hepatic vein as well as the portal anatomy was defined. A planned exit site from the hepatic vein and puncture site from the portal vein was placed into a single sonographic plane. Under direct ultrasound visualization, the ScorpionX needle was advanced into the central left portal vein. Hand injection of contrast confirmed position within the portal system. A Glidewire Advantage was then advanced into the splenic vein. A 5 French marking pigtail catheter was then advanced over the wire into the main portal vein and wire removed. Portal venogram was performed which demonstrated a patent portal vein. Portal manometry was then performed. The tract was then dilated to 8 mm with an 8 mm x 8 cm Athletis balloon. A 8-10 mm by 7 + 2 cm of Viatorr endograft was placed. No postop limits balloon molding was performed after placement of the shunt, right atrial and portal pressures were repeated. Completion portal venogram demonstrates a patent TIPS endograft. Pre-TIPS Mean Pressures (mmHg): Right atrium: 25 Portal vein: 35 Portosystemic gradient: 10 Post-TIPS Mean Pressures (mmHg): Right atrium:28 Portal vein: 30 Portosystemic gradient: 2 The left gastric vein was then selected within the MPA catheter and Glidewire Advantage. Left gastric venogram demonstrated significant hepatic fecal flow into the network of  gastric and a few small esophageal varices. Coil embolization was then performed with as assortment of 0.035 detachable Azur coils. The splenic vein was then selected splenic venogram was performed.  Short gastric vein was identified with hepatofugal flow into the large gastric varix. The short gastric vein was then selected and short gastric venogram was performed which demonstrated large network of gastric varices draining via the gastro renal shunt into the left renal vein. Coil embolization was then performed with an assortment of 0.035 detachable Azur coils. The catheter and sheath within retracted from within the tips endograft into the right atrium. A guidewire was advanced into the inferior vena cava followed by advancement of the MPA catheter and 10 French sheath. The left renal vein was selected. Left renal venogram demonstrated patency and normal caliber. Using a coaxial 7 French Tourguide sheath, the gastro renal shunt was then cannulated. Venogram demonstrated persistent hepatofugal flow from the gastro renal shunt to the left renal vein. Therefore, occlusion balloon was inserted and inflated to occlude the central aspect of the gastro renal shunt. Contrast was injected which demonstrated contrast trapped solely within the gastric shunts and no evidence of phrenic or esophageal involvement. The balloon was released and the contrast flowed into the left renal vein demonstrating complete evacuation. The balloon was then reinflated and the gastric varix was obliterated with a 3:2:1 air:3% sotradecol :lipiodol  solution. This was followed by placement of an assortment of 0.035 detachable Azur coils. The balloon was slowly deflated in there is no evidence of sclerosis aunt migration. The balloon catheter was retracted and completion venogram demonstrated complete embolization of the gastro renal shunt with preserved patency of the left renal vein. The catheters and sheath were removed and manual compression  was applied to the right internal jugular and right common femoral venous access sites until hemostasis was achieved. The patient was transferred to the PACU in stable condition. IMPRESSION: 1. Successful transjugular portosystemic shunt creation. 2. Portosystemic gradient of 10 mm Hg (absolute portal venous pressure 35 mm Hg) before shunt placement and 2 mm Hg (absolute portal venous pressure 30 mm Hg) after shunt placement. These measurements are likely erroneous given complex native portosystemic shunting. 3. Technically successful coil embolization of the left and short gastric veins. 4. Technically successful coil assisted retrograde transvenous obliteration of large gastric varix. Ester Sides, MD [facility] Radiology Electronically Signed   By: Ester Sides M.D.   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 No prior episodes of hematemesis or significant gastrointestinal hemorrhage. 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 EXAM: 1. Ultrasound-guided access of the right internal jugular vein 2. Ultrasound-guided access of the right common femoral vein 3. Hepatic venogram 4. Intravascular ultrasound 5. Catheterization of the portal vein 6. Portal venous and central manometry 7. Portal venogram 8. Creation of a transhepatic portal vein to hepatic vein shunt 9. Selective catheterization and venography of the left gastric vein. 10. Coil embolization of the left gastric vein. 11. Selective catheterization and venography of the short gastric vein. 12. Coil embolization of the short gastric vein. 13. Selective catheterization  and venography of left renal vein and gastrorenal shunt. 14. Coil assisted retrograde transvenous obliteration of gastrorenal shunt and varices. MEDICATIONS: As antibiotic prophylaxis, Rocephin  2 gm IV was ordered pre-procedure and administered intravenously within one hour of incision. ANESTHESIA/SEDATION: General - as administered by the Anesthesia  department CONTRAST:  100 mL Omnipaque  300, intravenous FLUOROSCOPY TIME:  One thousand three hundred seven mGy COMPLICATIONS: None immediate. PROCEDURE: Informed written consent was obtained from the patient after a thorough discussion of the procedural risks, benefits and alternatives. All questions were addressed. Maximal Sterile Barrier Technique was utilized including caps, mask, sterile gowns, sterile gloves, sterile drape, hand hygiene and skin antiseptic. A timeout was performed prior to the initiation of the procedure. A preliminary ultrasound of the right groin was performed and demonstrates a patent right common femoral vein. A permanent ultrasound image was recorded. Using a combination of fluoroscopy and ultrasound, an access site was determined. A small dermatotomy was made at the planned puncture site. Using ultrasound guidance, access into the right common femoral vein was obtained with visualization of needle entry into the vessel using a standard micropuncture technique. A wire was advanced into the IVC insert all fascial dilation performed. An 8 French, 11 cm vascular sheath was placed into the external iliac vein. Through this access site, an 48 French Accunav ICE catheter was advanced with ease under fluoroscopic guidance to the level of the intrahepatic inferior vena cava. A preliminary ultrasound of the right neck was performed and demonstrates a patent internal jugular vein. A permanent ultrasound image was recorded. Using a combination of fluoroscopy and ultrasound, an access site was determined. A small dermatotomy was made at the planned  puncture site. Using ultrasound guidance, access into the right internal jugular vein was obtained with visualization of needle entry into the vessel using a standard micropuncture technique. A wire was advanced into the IVC and serial fascial dilation performed. A 10 French tips sheath was placed into the internal jugular vein and advanced to the IVC. The jugular sheath was retracted into the right atrium and manometry was performed. A 5 French angled tip catheter was then directed into the middle hepatic vein. Hepatic venogram was performed. These images demonstrated patent hepatic vein with no stenosis. The catheter was advanced to a wedge portion of the a patent vein over which the 10 French sheath was advanced into the middle hepatic vein. Using ICE ultrasound visualization of the catheter within middle hepatic vein as well as the portal anatomy was defined. A planned exit site from the hepatic vein and puncture site from the portal vein was placed into a single sonographic plane. Under direct ultrasound visualization, the ScorpionX needle was advanced into the central left portal vein. Hand injection of contrast confirmed position within the portal system. A Glidewire Advantage was then advanced into the splenic vein. A 5 French marking pigtail catheter was then advanced over the wire into the main portal vein and wire removed. Portal venogram was performed which demonstrated a patent portal vein. Portal manometry was then performed. The tract was then dilated to 8 mm with an 8 mm x 8 cm Athletis balloon. A 8-10 mm by 7 + 2 cm of Viatorr endograft was placed. No postop limits balloon molding was performed after placement of the shunt, right atrial and portal pressures were repeated. Completion portal venogram demonstrates a patent TIPS endograft. Pre-TIPS Mean Pressures (mmHg): Right atrium: 25 Portal vein: 35 Portosystemic gradient: 10 Post-TIPS Mean Pressures (mmHg): Right atrium:28 Portal vein: 30  Portosystemic gradient: 2 The left gastric vein was then selected within the MPA catheter and Glidewire Advantage. Left gastric venogram demonstrated significant hepatic fecal flow into the network of gastric and a few small esophageal varices. Coil embolization was then performed with as assortment of 0.035 detachable  Azur coils. The splenic vein was then selected splenic venogram was performed. Short gastric vein was identified with hepatofugal flow into the large gastric varix. The short gastric vein was then selected and short gastric venogram was performed which demonstrated large network of gastric varices draining via the gastro renal shunt into the left renal vein. Coil embolization was then performed with an assortment of 0.035 detachable Azur coils. The catheter and sheath within retracted from within the tips endograft into the right atrium. A guidewire was advanced into the inferior vena cava followed by advancement of the MPA catheter and 10 French sheath. The left renal vein was selected. Left renal venogram demonstrated patency and normal caliber. Using a coaxial 7 French Tourguide sheath, the gastro renal shunt was then cannulated. Venogram demonstrated persistent hepatofugal flow from the gastro renal shunt to the left renal vein. Therefore, occlusion balloon was inserted and inflated to occlude the central aspect of the gastro renal shunt. Contrast was injected which demonstrated contrast trapped solely within the gastric shunts and no evidence of phrenic or esophageal involvement. The balloon was released and the contrast flowed into the left renal vein demonstrating complete evacuation. The balloon was then reinflated and the gastric varix was obliterated with a 3:2:1 air:3% sotradecol :lipiodol  solution. This was followed by placement of an assortment of 0.035 detachable Azur coils. The balloon was slowly deflated in there is no evidence of sclerosis aunt migration. The balloon catheter was  retracted and completion venogram demonstrated complete embolization of the gastro renal shunt with preserved patency of the left renal vein. The catheters and sheath were removed and manual compression was applied to the right internal jugular and right common femoral venous access sites until hemostasis was achieved. The patient was transferred to the PACU in stable condition. IMPRESSION: 1. Successful transjugular portosystemic shunt creation. 2. Portosystemic gradient of 10 mm Hg (absolute portal venous pressure 35 mm Hg) before shunt placement and 2 mm Hg (absolute portal venous pressure 30 mm Hg) after shunt placement. These measurements are likely erroneous given complex native portosystemic shunting. 3. Technically successful coil embolization of the left and short gastric veins. 4. Technically successful coil assisted retrograde transvenous obliteration of large gastric varix. Ester Sides, MD [facility] Radiology Electronically Signed   By: Ester Sides M.D.   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 No prior episodes of hematemesis or significant gastrointestinal hemorrhage. 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 EXAM: 1. Ultrasound-guided access of the right internal jugular vein 2. Ultrasound-guided access of the right common femoral vein 3. Hepatic venogram 4. Intravascular ultrasound 5. Catheterization of the portal vein 6. Portal venous and central manometry 7. Portal venogram 8. Creation of a transhepatic portal vein to hepatic vein shunt 9. Selective catheterization and venography of the  left gastric vein. 10. Coil embolization of the left gastric vein. 11. Selective catheterization and venography of the short gastric vein. 12. Coil embolization of the short gastric vein. 13. Selective catheterization and venography of left renal vein and gastrorenal shunt. 14. Coil assisted retrograde transvenous obliteration of gastrorenal shunt and varices. MEDICATIONS: As antibiotic prophylaxis, Rocephin  2 gm IV was ordered  pre-procedure and administered intravenously within one hour of incision. ANESTHESIA/SEDATION: General - as administered by the Anesthesia department CONTRAST:  100 mL Omnipaque  300, intravenous FLUOROSCOPY TIME:  One thousand three hundred seven mGy COMPLICATIONS: None immediate. PROCEDURE: Informed written consent was obtained from the patient after a thorough discussion of the procedural risks, benefits and alternatives. All questions were addressed. Maximal Sterile Barrier Technique was utilized including caps, mask, sterile gowns, sterile gloves, sterile drape, hand hygiene and skin antiseptic. A timeout was performed prior to the initiation of the procedure. A preliminary ultrasound of the right groin was performed and demonstrates a patent right common femoral vein. A permanent ultrasound image was recorded. Using a combination of fluoroscopy and ultrasound, an access site was determined. A small dermatotomy was made at the planned puncture site. Using ultrasound guidance, access into the right common femoral vein was obtained with visualization of needle entry into the vessel using a standard micropuncture technique. A wire was advanced into the IVC insert all fascial dilation performed. An 8 French, 11 cm vascular sheath was placed into the external iliac vein. Through this access site, an 51 French Accunav ICE catheter was advanced with ease under fluoroscopic guidance to the level of the intrahepatic inferior vena cava. A preliminary ultrasound of the right neck was performed and  demonstrates a patent internal jugular vein. A permanent ultrasound image was recorded. Using a combination of fluoroscopy and ultrasound, an access site was determined. A small dermatotomy was made at the planned puncture site. Using ultrasound guidance, access into the right internal jugular vein was obtained with visualization of needle entry into the vessel using a standard micropuncture technique. A wire was advanced into the IVC and serial fascial dilation performed. A 10 French tips sheath was placed into the internal jugular vein and advanced to the IVC. The jugular sheath was retracted into the right atrium and manometry was performed. A 5 French angled tip catheter was then directed into the middle hepatic vein. Hepatic venogram was performed. These images demonstrated patent hepatic vein with no stenosis. The catheter was advanced to a wedge portion of the a patent vein over which the 10 French sheath was advanced into the middle hepatic vein. Using ICE ultrasound visualization of the catheter within middle hepatic vein as well as the portal anatomy was defined. A planned exit site from the hepatic vein and puncture site from the portal vein was placed into a single sonographic plane. Under direct ultrasound visualization, the ScorpionX needle was advanced into the central left portal vein. Hand injection of contrast confirmed position within the portal system. A Glidewire Advantage was then advanced into the splenic vein. A 5 French marking pigtail catheter was then advanced over the wire into the main portal vein and wire removed. Portal venogram was performed which demonstrated a patent portal vein. Portal manometry was then performed. The tract was then dilated to 8 mm with an 8 mm x 8 cm Athletis balloon. A 8-10 mm by 7 + 2 cm of Viatorr endograft was placed. No postop limits balloon molding was performed after placement of the shunt, right atrial and portal pressures were repeated. Completion portal  venogram demonstrates a patent TIPS endograft. Pre-TIPS Mean Pressures (mmHg): Right atrium: 25 Portal vein: 35 Portosystemic gradient: 10 Post-TIPS Mean Pressures (mmHg): Right atrium:28 Portal vein: 30 Portosystemic gradient: 2 The left gastric vein was then selected within the MPA catheter and Glidewire Advantage. Left gastric venogram demonstrated significant hepatic fecal flow into the network of gastric  and a few small esophageal varices. Coil embolization was then performed with as assortment of 0.035 detachable Azur coils. The splenic vein was then selected splenic venogram was performed. Short gastric vein was identified with hepatofugal flow into the large gastric varix. The short gastric vein was then selected and short gastric venogram was performed which demonstrated large network of gastric varices draining via the gastro renal shunt into the left renal vein. Coil embolization was then performed with an assortment of 0.035 detachable Azur coils. The catheter and sheath within retracted from within the tips endograft into the right atrium. A guidewire was advanced into the inferior vena cava followed by advancement of the MPA catheter and 10 French sheath. The left renal vein was selected. Left renal venogram demonstrated patency and normal caliber. Using a coaxial 7 French Tourguide sheath, the gastro renal shunt was then cannulated. Venogram demonstrated persistent hepatofugal flow from the gastro renal shunt to the left renal vein. Therefore, occlusion balloon was inserted and inflated to occlude the central aspect of the gastro renal shunt. Contrast was injected which demonstrated contrast trapped solely within the gastric shunts and no evidence of phrenic or esophageal involvement. The balloon was released and the contrast flowed into the left renal vein demonstrating complete evacuation. The balloon was then reinflated and the gastric varix was obliterated with a 3:2:1 air:3% sotradecol :lipiodol   solution. This was followed by placement of an assortment of 0.035 detachable Azur coils. The balloon was slowly deflated in there is no evidence of sclerosis aunt migration. The balloon catheter was retracted and completion venogram demonstrated complete embolization of the gastro renal shunt with preserved patency of the left renal vein. The catheters and sheath were removed and manual compression was applied to the right internal jugular and right common femoral venous access sites until hemostasis was achieved. The patient was transferred to the PACU in stable condition. IMPRESSION: 1. Successful transjugular portosystemic shunt creation. 2. Portosystemic gradient of 10 mm Hg (absolute portal venous pressure 35 mm Hg) before shunt placement and 2 mm Hg (absolute portal venous pressure 30 mm Hg) after shunt placement. These measurements are likely erroneous given complex native portosystemic shunting. 3. Technically successful coil embolization of the left and short gastric veins. 4. Technically successful coil assisted retrograde transvenous obliteration of large gastric varix. Ester Sides, MD [facility] Radiology Electronically Signed   By: Ester Sides M.D.   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 No prior episodes of hematemesis or significant gastrointestinal hemorrhage. 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 EXAM: 1. Ultrasound-guided access of the right internal jugular vein 2. Ultrasound-guided access of the right common femoral vein 3. Hepatic venogram 4. Intravascular ultrasound 5.  Catheterization of the portal vein 6. Portal venous and central manometry 7. Portal venogram 8. Creation of a transhepatic portal vein to hepatic vein shunt 9. Selective catheterization and venography of the left gastric vein. 10. Coil embolization of the left gastric vein. 11. Selective catheterization and venography of the short gastric vein. 12. Coil embolization of the short gastric vein. 13. Selective catheterization and venography of left renal vein and gastrorenal shunt. 14. Coil  assisted retrograde transvenous obliteration of gastrorenal shunt and varices. MEDICATIONS: As antibiotic prophylaxis, Rocephin  2 gm IV was ordered pre-procedure and administered intravenously within one hour of incision. ANESTHESIA/SEDATION: General - as administered by the Anesthesia department CONTRAST:  100 mL Omnipaque  300, intravenous FLUOROSCOPY TIME:  One thousand three hundred seven mGy COMPLICATIONS: None immediate. PROCEDURE: Informed written consent was obtained from the patient after a thorough discussion of the procedural risks, benefits and alternatives. All questions were addressed. Maximal Sterile Barrier Technique was utilized including caps, mask, sterile gowns, sterile gloves, sterile drape, hand hygiene and skin antiseptic. A timeout was performed prior to the initiation of the procedure. A preliminary ultrasound of the right groin was performed and demonstrates a patent right common femoral vein. A permanent ultrasound image was recorded. Using a combination of fluoroscopy and ultrasound, an access site was determined. A small dermatotomy was made at the planned puncture site. Using ultrasound guidance, access into the right common femoral vein was obtained with visualization of needle entry into the vessel using a standard micropuncture technique. A wire was advanced into the IVC insert all fascial dilation performed. An 8 French, 11 cm vascular sheath was placed into the external iliac vein. Through this  access site, an 57 French Accunav ICE catheter was advanced with ease under fluoroscopic guidance to the level of the intrahepatic inferior vena cava. A preliminary ultrasound of the right neck was performed and demonstrates a patent internal jugular vein. A permanent ultrasound image was recorded. Using a combination of fluoroscopy and ultrasound, an access site was determined. A small dermatotomy was made at the planned puncture site. Using ultrasound guidance, access into the right internal jugular vein was obtained with visualization of needle entry into the vessel using a standard micropuncture technique. A wire was advanced into the IVC and serial fascial dilation performed. A 10 French tips sheath was placed into the internal jugular vein and advanced to the IVC. The jugular sheath was retracted into the right atrium and manometry was performed. A 5 French angled tip catheter was then directed into the middle hepatic vein. Hepatic venogram was performed. These images demonstrated patent hepatic vein with no stenosis. The catheter was advanced to a wedge portion of the a patent vein over which the 10 French sheath was advanced into the middle hepatic vein. Using ICE ultrasound visualization of the catheter within middle hepatic vein as well as the portal anatomy was defined. A planned exit site from the hepatic vein and puncture site from the portal vein was placed into a single sonographic plane. Under direct ultrasound visualization, the ScorpionX needle was advanced into the central left portal vein. Hand injection of contrast confirmed position within the portal system. A Glidewire Advantage was then advanced into the splenic vein. A 5 French marking pigtail catheter was then advanced over the wire into the main portal vein and wire removed. Portal venogram was performed which demonstrated a patent portal vein. Portal manometry was then performed. The tract was then dilated to 8 mm with an 8 mm x 8 cm  Athletis balloon. A 8-10 mm by 7 + 2 cm of Viatorr endograft was placed. No postop limits balloon molding was performed after placement of the shunt, right atrial and portal pressures were repeated. Completion portal venogram demonstrates a patent TIPS endograft. Pre-TIPS Mean Pressures (mmHg): Right atrium: 25 Portal vein: 35 Portosystemic gradient: 10 Post-TIPS Mean Pressures (mmHg): Right atrium:28 Portal vein: 30 Portosystemic gradient: 2 The left gastric vein was then selected within  the MPA catheter and Glidewire Advantage. Left gastric venogram demonstrated significant hepatic fecal flow into the network of gastric and a few small esophageal varices. Coil embolization was then performed with as assortment of 0.035 detachable Azur coils. The splenic vein was then selected splenic venogram was performed. Short gastric vein was identified with hepatofugal flow into the large gastric varix. The short gastric vein was then selected and short gastric venogram was performed which demonstrated large network of gastric varices draining via the gastro renal shunt into the left renal vein. Coil embolization was then performed with an assortment of 0.035 detachable Azur coils. The catheter and sheath within retracted from within the tips endograft into the right atrium. A guidewire was advanced into the inferior vena cava followed by advancement of the MPA catheter and 10 French sheath. The left renal vein was selected. Left renal venogram demonstrated patency and normal caliber. Using a coaxial 7 French Tourguide sheath, the gastro renal shunt was then cannulated. Venogram demonstrated persistent hepatofugal flow from the gastro renal shunt to the left renal vein. Therefore, occlusion balloon was inserted and inflated to occlude the central aspect of the gastro renal shunt. Contrast was injected which demonstrated contrast trapped solely within the gastric shunts and no evidence of phrenic or esophageal involvement.  The balloon was released and the contrast flowed into the left renal vein demonstrating complete evacuation. The balloon was then reinflated and the gastric varix was obliterated with a 3:2:1 air:3% sotradecol :lipiodol  solution. This was followed by placement of an assortment of 0.035 detachable Azur coils. The balloon was slowly deflated in there is no evidence of sclerosis aunt migration. The balloon catheter was retracted and completion venogram demonstrated complete embolization of the gastro renal shunt with preserved patency of the left renal vein. The catheters and sheath were removed and manual compression was applied to the right internal jugular and right common femoral venous access sites until hemostasis was achieved. The patient was transferred to the PACU in stable condition. IMPRESSION: 1. Successful transjugular portosystemic shunt creation. 2. Portosystemic gradient of 10 mm Hg (absolute portal venous pressure 35 mm Hg) before shunt placement and 2 mm Hg (absolute portal venous pressure 30 mm Hg) after shunt placement. These measurements are likely erroneous given complex native portosystemic shunting. 3. Technically successful coil embolization of the left and short gastric veins. 4. Technically successful coil assisted retrograde transvenous obliteration of large gastric varix. Ester Sides, MD [facility] Radiology Electronically Signed   By: Ester Sides M.D.   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 No prior episodes of hematemesis or significant gastrointestinal hemorrhage. 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 EXAM: 1. Ultrasound-guided access of the right internal jugular vein 2. Ultrasound-guided access of the right common femoral vein 3. Hepatic venogram 4. Intravascular ultrasound 5. Catheterization of the portal vein 6. Portal venous and central manometry 7. Portal venogram 8. Creation of a transhepatic portal vein to hepatic vein shunt 9. Selective catheterization and venography of the left gastric vein. 10. Coil embolization of the left gastric vein. 11. Selective catheterization and venography of the short gastric vein. 12. Coil embolization  of the short gastric vein. 13. Selective catheterization and venography of left renal vein and gastrorenal shunt. 14. Coil assisted retrograde transvenous obliteration of gastrorenal shunt and varices. MEDICATIONS: As antibiotic prophylaxis, Rocephin  2 gm IV was ordered pre-procedure and administered intravenously within one hour of incision. ANESTHESIA/SEDATION: General - as administered by the Anesthesia department CONTRAST:  100 mL Omnipaque  300, intravenous FLUOROSCOPY TIME:  One thousand three hundred seven mGy COMPLICATIONS: None immediate. PROCEDURE: Informed written consent was obtained from the patient after a thorough discussion of the procedural risks, benefits and alternatives. All questions were addressed. Maximal Sterile Barrier Technique was utilized including caps, mask, sterile gowns, sterile gloves, sterile drape, hand hygiene and skin antiseptic. A timeout was performed prior to the initiation of the procedure. A preliminary ultrasound of the right groin was performed and demonstrates a patent right common femoral vein. A permanent ultrasound image was recorded. Using a combination of fluoroscopy and ultrasound, an access site was determined. A small dermatotomy was made at the planned puncture site. Using ultrasound guidance, access into the right common femoral vein was obtained with visualization of needle entry into  the vessel using a standard micropuncture technique. A wire was advanced into the IVC insert all fascial dilation performed. An 8 French, 11 cm vascular sheath was placed into the external iliac vein. Through this access site, an 76 French Accunav ICE catheter was advanced with ease under fluoroscopic guidance to the level of the intrahepatic inferior vena cava. A preliminary ultrasound of the right neck was performed and demonstrates a patent internal jugular vein. A permanent ultrasound image was recorded. Using a combination of fluoroscopy and ultrasound, an access site was determined. A small dermatotomy was made at the planned puncture site. Using ultrasound guidance, access into the right internal jugular vein was obtained with visualization of needle entry into the vessel using a standard micropuncture technique. A wire was advanced into the IVC and serial fascial dilation performed. A 10 French tips sheath was placed into the internal jugular vein and advanced to the IVC. The jugular sheath was retracted into the right atrium and manometry was performed. A 5 French angled tip catheter was then directed into the middle hepatic vein. Hepatic venogram was performed. These images demonstrated patent hepatic vein with no stenosis. The catheter was advanced to a wedge portion of the a patent vein over which the 10 French sheath was advanced into the middle hepatic vein. Using ICE ultrasound visualization of the catheter within middle hepatic vein as well as the portal anatomy was defined. A planned exit site from the hepatic vein and puncture site from the portal vein was placed into a single sonographic plane. Under direct ultrasound visualization, the ScorpionX needle was advanced into the central left portal vein. Hand injection of contrast confirmed position within the portal system. A Glidewire Advantage was then advanced into the splenic vein. A 5 French marking pigtail catheter was then advanced over the  wire into the main portal vein and wire removed. Portal venogram was performed which demonstrated a patent portal vein. Portal manometry was then performed. The tract was then dilated to 8 mm with an 8 mm x 8 cm Athletis balloon. A 8-10 mm by 7 + 2 cm of Viatorr endograft was placed. No postop limits balloon molding was performed after placement of the shunt, right atrial and portal pressures were repeated. Completion portal venogram demonstrates a patent TIPS endograft. Pre-TIPS Mean Pressures (mmHg): Right atrium: 25 Portal vein: 35 Portosystemic gradient: 10 Post-TIPS  Mean Pressures (mmHg): Right atrium:28 Portal vein: 30 Portosystemic gradient: 2 The left gastric vein was then selected within the MPA catheter and Glidewire Advantage. Left gastric venogram demonstrated significant hepatic fecal flow into the network of gastric and a few small esophageal varices. Coil embolization was then performed with as assortment of 0.035 detachable Azur coils. The splenic vein was then selected splenic venogram was performed. Short gastric vein was identified with hepatofugal flow into the large gastric varix. The short gastric vein was then selected and short gastric venogram was performed which demonstrated large network of gastric varices draining via the gastro renal shunt into the left renal vein. Coil embolization was then performed with an assortment of 0.035 detachable Azur coils. The catheter and sheath within retracted from within the tips endograft into the right atrium. A guidewire was advanced into the inferior vena cava followed by advancement of the MPA catheter and 10 French sheath. The left renal vein was selected. Left renal venogram demonstrated patency and normal caliber. Using a coaxial 7 French Tourguide sheath, the gastro renal shunt was then cannulated. Venogram demonstrated persistent hepatofugal flow from the gastro renal shunt to the left renal vein. Therefore, occlusion balloon was inserted and  inflated to occlude the central aspect of the gastro renal shunt. Contrast was injected which demonstrated contrast trapped solely within the gastric shunts and no evidence of phrenic or esophageal involvement. The balloon was released and the contrast flowed into the left renal vein demonstrating complete evacuation. The balloon was then reinflated and the gastric varix was obliterated with a 3:2:1 air:3% sotradecol :lipiodol  solution. This was followed by placement of an assortment of 0.035 detachable Azur coils. The balloon was slowly deflated in there is no evidence of sclerosis aunt migration. The balloon catheter was retracted and completion venogram demonstrated complete embolization of the gastro renal shunt with preserved patency of the left renal vein. The catheters and sheath were removed and manual compression was applied to the right internal jugular and right common femoral venous access sites until hemostasis was achieved. The patient was transferred to the PACU in stable condition. IMPRESSION: 1. Successful transjugular portosystemic shunt creation. 2. Portosystemic gradient of 10 mm Hg (absolute portal venous pressure 35 mm Hg) before shunt placement and 2 mm Hg (absolute portal venous pressure 30 mm Hg) after shunt placement. These measurements are likely erroneous given complex native portosystemic shunting. 3. Technically successful coil embolization of the left and short gastric veins. 4. Technically successful coil assisted retrograde transvenous obliteration of large gastric varix. Ester Sides, MD [facility] Radiology Electronically Signed   By: Ester Sides M.D.   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 No prior episodes of hematemesis or significant gastrointestinal hemorrhage. 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 EXAM: 1. Ultrasound-guided access of the right internal jugular vein 2. Ultrasound-guided access of the right common femoral vein 3. Hepatic venogram 4. Intravascular ultrasound 5. Catheterization of the portal vein 6. Portal venous and central manometry 7. Portal venogram 8. Creation of a transhepatic portal vein to hepatic vein shunt 9. Selective catheterization and venography of the left gastric vein. 10. Coil embolization of the left gastric vein.  11. Selective catheterization and venography of the short gastric vein. 12. Coil embolization of the short gastric vein. 13. Selective catheterization and venography of left renal vein and gastrorenal shunt. 14. Coil assisted retrograde transvenous obliteration of gastrorenal shunt and varices. MEDICATIONS: As antibiotic prophylaxis, Rocephin  2 gm IV was ordered pre-procedure and administered intravenously within one hour of incision. ANESTHESIA/SEDATION: General - as administered by the Anesthesia department CONTRAST:  100 mL Omnipaque  300, intravenous FLUOROSCOPY TIME:  One thousand three hundred seven mGy COMPLICATIONS: None immediate. PROCEDURE: Informed written consent was obtained from the patient after a thorough discussion of the procedural risks, benefits and alternatives. All questions were addressed. Maximal Sterile Barrier Technique was utilized including caps, mask, sterile gowns, sterile gloves, sterile drape, hand hygiene and skin antiseptic. A timeout was performed prior to the initiation of the procedure. A preliminary ultrasound of the right groin was performed and demonstrates a patent right common femoral vein. A permanent ultrasound image was recorded. Using a combination of fluoroscopy and ultrasound, an access site was  determined. A small dermatotomy was made at the planned puncture site. Using ultrasound guidance, access into the right common femoral vein was obtained with visualization of needle entry into the vessel using a standard micropuncture technique. A wire was advanced into the IVC insert all fascial dilation performed. An 8 French, 11 cm vascular sheath was placed into the external iliac vein. Through this access site, an 3 French Accunav ICE catheter was advanced with ease under fluoroscopic guidance to the level of the intrahepatic inferior vena cava. A preliminary ultrasound of the right neck was performed and demonstrates a patent internal jugular vein. A permanent ultrasound image was recorded. Using a combination of fluoroscopy and ultrasound, an access site was determined. A small dermatotomy was made at the planned puncture site. Using ultrasound guidance, access into the right internal jugular vein was obtained with visualization of needle entry into the vessel using a standard micropuncture technique. A wire was advanced into the IVC and serial fascial dilation performed. A 10 French tips sheath was placed into the internal jugular vein and advanced to the IVC. The jugular sheath was retracted into the right atrium and manometry was performed. A 5 French angled tip catheter was then directed into the middle hepatic vein. Hepatic venogram was performed. These images demonstrated patent hepatic vein with no stenosis. The catheter was advanced to a wedge portion of the a patent vein over which the 10 French sheath was advanced into the middle hepatic vein. Using ICE ultrasound visualization of the catheter within middle hepatic vein as well as the portal anatomy was defined. A planned exit site from the hepatic vein and puncture site from the portal vein was placed into a single sonographic plane. Under direct ultrasound visualization, the ScorpionX needle was advanced into the central left portal vein. Hand  injection of contrast confirmed position within the portal system. A Glidewire Advantage was then advanced into the splenic vein. A 5 French marking pigtail catheter was then advanced over the wire into the main portal vein and wire removed. Portal venogram was performed which demonstrated a patent portal vein. Portal manometry was then performed. The tract was then dilated to 8 mm with an 8 mm x 8 cm Athletis balloon. A 8-10 mm by 7 + 2 cm of Viatorr endograft was placed. No postop limits balloon molding was performed after placement of the shunt, right atrial and portal pressures were repeated. Completion portal venogram demonstrates a patent TIPS endograft. Pre-TIPS  Mean Pressures (mmHg): Right atrium: 25 Portal vein: 35 Portosystemic gradient: 10 Post-TIPS Mean Pressures (mmHg): Right atrium:28 Portal vein: 30 Portosystemic gradient: 2 The left gastric vein was then selected within the MPA catheter and Glidewire Advantage. Left gastric venogram demonstrated significant hepatic fecal flow into the network of gastric and a few small esophageal varices. Coil embolization was then performed with as assortment of 0.035 detachable Azur coils. The splenic vein was then selected splenic venogram was performed. Short gastric vein was identified with hepatofugal flow into the large gastric varix. The short gastric vein was then selected and short gastric venogram was performed which demonstrated large network of gastric varices draining via the gastro renal shunt into the left renal vein. Coil embolization was then performed with an assortment of 0.035 detachable Azur coils. The catheter and sheath within retracted from within the tips endograft into the right atrium. A guidewire was advanced into the inferior vena cava followed by advancement of the MPA catheter and 10 French sheath. The left renal vein was selected. Left renal venogram demonstrated patency and normal caliber. Using a coaxial 7 French Tourguide sheath,  the gastro renal shunt was then cannulated. Venogram demonstrated persistent hepatofugal flow from the gastro renal shunt to the left renal vein. Therefore, occlusion balloon was inserted and inflated to occlude the central aspect of the gastro renal shunt. Contrast was injected which demonstrated contrast trapped solely within the gastric shunts and no evidence of phrenic or esophageal involvement. The balloon was released and the contrast flowed into the left renal vein demonstrating complete evacuation. The balloon was then reinflated and the gastric varix was obliterated with a 3:2:1 air:3% sotradecol :lipiodol  solution. This was followed by placement of an assortment of 0.035 detachable Azur coils. The balloon was slowly deflated in there is no evidence of sclerosis aunt migration. The balloon catheter was retracted and completion venogram demonstrated complete embolization of the gastro renal shunt with preserved patency of the left renal vein. The catheters and sheath were removed and manual compression was applied to the right internal jugular and right common femoral venous access sites until hemostasis was achieved. The patient was transferred to the PACU in stable condition. IMPRESSION: 1. Successful transjugular portosystemic shunt creation. 2. Portosystemic gradient of 10 mm Hg (absolute portal venous pressure 35 mm Hg) before shunt placement and 2 mm Hg (absolute portal venous pressure 30 mm Hg) after shunt placement. These measurements are likely erroneous given complex native portosystemic shunting. 3. Technically successful coil embolization of the left and short gastric veins. 4. Technically successful coil assisted retrograde transvenous obliteration of large gastric varix. Ester Sides, MD [facility] Radiology Electronically Signed   By: Ester Sides M.D.   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 No prior episodes of hematemesis or significant gastrointestinal hemorrhage. 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 EXAM: 1. Ultrasound-guided access of the right internal jugular vein 2. Ultrasound-guided access of the right common femoral vein 3. Hepatic venogram 4. Intravascular ultrasound 5. Catheterization of the portal vein 6. Portal venous and central manometry 7. Portal venogram 8. Creation of a transhepatic portal vein to hepatic vein shunt 9. Selective catheterization and venography  of the left gastric vein. 10. Coil embolization of the left gastric vein. 11. Selective catheterization and venography of the short gastric vein. 12. Coil embolization of the short gastric vein. 13. Selective catheterization and venography of left renal vein and gastrorenal shunt. 14. Coil assisted retrograde transvenous obliteration of gastrorenal shunt and varices. MEDICATIONS: As antibiotic prophylaxis, Rocephin  2 gm IV was ordered pre-procedure and administered intravenously within one hour of incision. ANESTHESIA/SEDATION: General - as administered by the Anesthesia department CONTRAST:  100 mL Omnipaque  300, intravenous FLUOROSCOPY TIME:  One thousand three hundred seven mGy COMPLICATIONS: None immediate. PROCEDURE: Informed written consent was obtained from the patient after a thorough discussion of the procedural risks, benefits and alternatives. All questions were addressed. Maximal Sterile Barrier Technique was utilized including caps, mask, sterile gowns, sterile gloves, sterile drape, hand hygiene and skin antiseptic. A timeout was performed prior to the initiation of the procedure. A preliminary ultrasound  of the right groin was performed and demonstrates a patent right common femoral vein. A permanent ultrasound image was recorded. Using a combination of fluoroscopy and ultrasound, an access site was determined. A small dermatotomy was made at the planned puncture site. Using ultrasound guidance, access into the right common femoral vein was obtained with visualization of needle entry into the vessel using a standard micropuncture technique. A wire was advanced into the IVC insert all fascial dilation performed. An 8 French, 11 cm vascular sheath was placed into the external iliac vein. Through this access site, an 53 French Accunav ICE catheter was advanced with ease under fluoroscopic guidance to the level of the intrahepatic inferior vena cava. A preliminary ultrasound of the right neck was performed and demonstrates a patent internal jugular vein. A permanent ultrasound image was recorded. Using a combination of fluoroscopy and ultrasound, an access site was determined. A small dermatotomy was made at the planned puncture site. Using ultrasound guidance, access into the right internal jugular vein was obtained with visualization of needle entry into the vessel using a standard micropuncture technique. A wire was advanced into the IVC and serial fascial dilation performed. A 10 French tips sheath was placed into the internal jugular vein and advanced to the IVC. The jugular sheath was retracted into the right atrium and manometry was performed. A 5 French angled tip catheter was then directed into the middle hepatic vein. Hepatic venogram was performed. These images demonstrated patent hepatic vein with no stenosis. The catheter was advanced to a wedge portion of the a patent vein over which the 10 French sheath was advanced into the middle hepatic vein. Using ICE ultrasound visualization of the catheter within middle hepatic vein as well as the portal anatomy was defined. A planned exit site from the hepatic vein  and puncture site from the portal vein was placed into a single sonographic plane. Under direct ultrasound visualization, the ScorpionX needle was advanced into the central left portal vein. Hand injection of contrast confirmed position within the portal system. A Glidewire Advantage was then advanced into the splenic vein. A 5 French marking pigtail catheter was then advanced over the wire into the main portal vein and wire removed. Portal venogram was performed which demonstrated a patent portal vein. Portal manometry was then performed. The tract was then dilated to 8 mm with an 8 mm x 8 cm Athletis balloon. A 8-10 mm by 7 + 2 cm of Viatorr endograft was placed. No postop limits balloon molding was performed after placement of the shunt, right atrial and  portal pressures were repeated. Completion portal venogram demonstrates a patent TIPS endograft. Pre-TIPS Mean Pressures (mmHg): Right atrium: 25 Portal vein: 35 Portosystemic gradient: 10 Post-TIPS Mean Pressures (mmHg): Right atrium:28 Portal vein: 30 Portosystemic gradient: 2 The left gastric vein was then selected within the MPA catheter and Glidewire Advantage. Left gastric venogram demonstrated significant hepatic fecal flow into the network of gastric and a few small esophageal varices. Coil embolization was then performed with as assortment of 0.035 detachable Azur coils. The splenic vein was then selected splenic venogram was performed. Short gastric vein was identified with hepatofugal flow into the large gastric varix. The short gastric vein was then selected and short gastric venogram was performed which demonstrated large network of gastric varices draining via the gastro renal shunt into the left renal vein. Coil embolization was then performed with an assortment of 0.035 detachable Azur coils. The catheter and sheath within retracted from within the tips endograft into the right atrium. A guidewire was advanced into the inferior vena cava  followed by advancement of the MPA catheter and 10 French sheath. The left renal vein was selected. Left renal venogram demonstrated patency and normal caliber. Using a coaxial 7 French Tourguide sheath, the gastro renal shunt was then cannulated. Venogram demonstrated persistent hepatofugal flow from the gastro renal shunt to the left renal vein. Therefore, occlusion balloon was inserted and inflated to occlude the central aspect of the gastro renal shunt. Contrast was injected which demonstrated contrast trapped solely within the gastric shunts and no evidence of phrenic or esophageal involvement. The balloon was released and the contrast flowed into the left renal vein demonstrating complete evacuation. The balloon was then reinflated and the gastric varix was obliterated with a 3:2:1 air:3% sotradecol :lipiodol  solution. This was followed by placement of an assortment of 0.035 detachable Azur coils. The balloon was slowly deflated in there is no evidence of sclerosis aunt migration. The balloon catheter was retracted and completion venogram demonstrated complete embolization of the gastro renal shunt with preserved patency of the left renal vein. The catheters and sheath were removed and manual compression was applied to the right internal jugular and right common femoral venous access sites until hemostasis was achieved. The patient was transferred to the PACU in stable condition. IMPRESSION: 1. Successful transjugular portosystemic shunt creation. 2. Portosystemic gradient of 10 mm Hg (absolute portal venous pressure 35 mm Hg) before shunt placement and 2 mm Hg (absolute portal venous pressure 30 mm Hg) after shunt placement. These measurements are likely erroneous given complex native portosystemic shunting. 3. Technically successful coil embolization of the left and short gastric veins. 4. Technically successful coil assisted retrograde transvenous obliteration of large gastric varix. Ester Sides, MD  [facility] Radiology Electronically Signed   By: Ester Sides M.D.   On: 08/28/2023 21:48   VAS US  CAROTID Result Date: 08/18/2023 Carotid Arterial Duplex Study Patient Name:  ARIHANT PENNINGS [facility]  Date of Exam:   08/17/2023 Medical Rec #: [id number]           Accession #:    [id number] Date of Birth: 23-Jul-1951           Patient Gender: M Patient Age:   47 years Exam Location:  [facility] Procedure:      VAS US  CAROTID Referring Phys: DORN BERRY --------------------------------------------------------------------------------  Indications:       Bilateral bruits. Risk Factors:      Hypertension, hyperlipidemia, Diabetes, current smoker. Comparison Study:  None Performing Technologist: [employer]  BS, RVT, RDCS  Examination Guidelines: A complete evaluation includes B-mode imaging, spectral Doppler, color Doppler, and power Doppler as needed of all accessible portions of each vessel. Bilateral testing is considered an integral part of a complete examination. Limited examinations for reoccurring indications may be performed as noted.  Right Carotid Findings: +----------+--------+--------+--------+------------------+------------------+           PSV cm/sEDV cm/sStenosisPlaque DescriptionComments           +----------+--------+--------+--------+------------------+------------------+ CCA Prox  97      2                                                    +----------+--------+--------+--------+------------------+------------------+ CCA Distal51      12                                intimal thickening +----------+--------+--------+--------+------------------+------------------+ ICA Prox  113     27              homogeneous                          +----------+--------+--------+--------+------------------+------------------+ ICA Mid   152     40      40-59%  homogeneous                           +----------+--------+--------+--------+------------------+------------------+ ICA Distal80      27                                                   +----------+--------+--------+--------+------------------+------------------+ ECA       79      7                                                    +----------+--------+--------+--------+------------------+------------------+ +----------+--------+-------+----------------+-------------------+           PSV cm/sEDV cmsDescribe        Arm Pressure (mmHG) +----------+--------+-------+----------------+-------------------+ Subclavian129            Multiphasic, [id number]                 +----------+--------+-------+----------------+-------------------+ +---------+--------+--+--------+--+---------+ VertebralPSV cm/s41EDV cm/s10Antegrade +---------+--------+--+--------+--+---------+  Left Carotid Findings: +----------+-------+--------+--------+-----------------------+-----------------+           PSV    EDV cm/sStenosisPlaque Description     Comments                    cm/s                                                            +----------+-------+--------+--------+-----------------------+-----------------+ CCA Prox  64     7                                                        +----------+-------+--------+--------+-----------------------+-----------------+  CCA Mid                                                 intimal                                                                   thickening        +----------+-------+--------+--------+-----------------------+-----------------+ CCA Distal64     15                                     intimal                                                                   thickening        +----------+-------+--------+--------+-----------------------+-----------------+ ICA Prox  66     14      1-39%   irregular and                                                              homogeneous                              +----------+-------+--------+--------+-----------------------+-----------------+ ICA Mid   75     18                                                       +----------+-------+--------+--------+-----------------------+-----------------+ ICA Distal78     22                                                       +----------+-------+--------+--------+-----------------------+-----------------+ ECA       99     11                                                       +----------+-------+--------+--------+-----------------------+-----------------+ +----------+--------+--------+----------------+-------------------+           PSV cm/sEDV cm/sDescribe        Arm Pressure (mmHG) +----------+--------+--------+----------------+-------------------+ [id number]             Multiphasic, [id number]                 +----------+--------+--------+----------------+-------------------+ +---------+--------+--+--------+--+---------+  VertebralPSV cm/s81EDV cm/s12Antegrade +---------+--------+--+--------+--+---------+   Summary: Right Carotid: Velocities in the right ICA are consistent with a 40-59%                stenosis. Left Carotid: Velocities in the left ICA are consistent with a 1-39% stenosis. Vertebrals:  Bilateral vertebral arteries demonstrate antegrade flow. Subclavians: Normal flow hemodynamics were seen in bilateral subclavian              arteries. *See table(s) above for measurements and observations. Suggest follow up study in 12 months. Study reveals bilateral homogeneous plaque of moderate degree. Electronically signed by Erick Bergamo on 08/18/2023 at 5:32:07 PM.    Final    IR Radiologist Eval & Mgmt Result Date: 08/10/2023 EXAM: ESTABLISHED PATIENT OFFICE VISIT CHIEF COMPLAINT: See Epic note. HISTORY OF PRESENT ILLNESS: See Epic note. REVIEW OF SYSTEMS: See Epic note. PHYSICAL EXAMINATION: See Epic note.  ASSESSMENT AND PLAN: See Epic note. Ester Sides, MD [facility] Radiology Electronically Signed   By: Ester Sides M.D.   On: 08/10/2023 15:53    Treatments: Observation  Discharge Exam: Blood pressure 117/85, pulse 60, temperature 98.1 F (36.7 C), temperature source Oral, resp. rate 16, height 6' (1.829 m), weight 257 lb (116.6 kg), SpO2 99%. Physical Exam  Disposition: Discharge disposition: 01-Home or Self Care        Allergies as of 08/29/2023       Reactions   Lipitor [atorvastatin Calcium] Other (See Comments)   shaking   Sulfa Antibiotics Other (See Comments)   unknown   Zocor [simvastatin - High Dose] Other (See Comments)   shaking        Medication List     TAKE these medications    acetaminophen  500 MG tablet Commonly known as: TYLENOL  Take 1,000 mg by mouth every 8 (eight) hours as needed for moderate pain.   albuterol  108 (90 Base) MCG/ACT inhaler Commonly known as: VENTOLIN  HFA Inhale 1-2 puffs into the lungs every 4 (four) hours as needed for wheezing or shortness of breath.   carvedilol  3.125 MG tablet Commonly known as: Coreg  Take 1 tablet (3.125 mg total) by mouth 2 (two) times daily with a meal.   Clobetasol Propionate 0.05 % lotion Apply 1 Application topically 2 (two) times daily as needed (psoriasis).   ezetimibe  10 MG tablet Commonly known as: ZETIA  TAKE 1 TABLET EVERY DAY   ipratropium 0.03 % nasal spray Commonly known as: ATROVENT  Place 2 sprays into both nostrils 2 (two) times daily.   lactulose  10 GM/15ML solution Commonly known as: CHRONULAC  Take 30 mLs (20 g total) by mouth 2 (two) times daily. What changed:  when to take this reasons to take this   omeprazole  40 MG capsule Commonly known as: PRILOSEC Take 1 capsule (40 mg total) by mouth daily.   vitamin E  180 MG (400 UNITS) capsule Take 400 Units by mouth daily.   zinc  gluconate 50 MG tablet Take 50 mg by mouth  daily.        Follow-up Information     [facility] Follow up.   Why: Please follow up with Dr. Sides in one month. A scheduler from our clinic will call you with a date/time of your appointment. Please call Warren Dais, NP [phone] with any questions/concerns prior to your visit. Contact information: [address] [place] [address] [phone]  Electronically Signed: Warren JONELLE Dais, NP 08/29/2023, 9:38 AM   I have spent Less Than 30 Minutes discharging Ricky Rios.

## 2023-08-29 NOTE — Progress Notes (Signed)
Pt discharged to home with wife via wheelchair with all belongings and paperwork

## 2023-08-30 ENCOUNTER — Encounter: Payer: Self-pay | Admitting: Physical Medicine and Rehabilitation

## 2023-08-31 ENCOUNTER — Telehealth (HOSPITAL_COMMUNITY): Payer: Self-pay | Admitting: Student

## 2023-08-31 NOTE — Telephone Encounter (Signed)
   [facility] TIPS post-procedure phone call follow-up   Ricky Rios is a 73 y.o. male who underwent TIPS creation at [facility] 08/28/23 with Dr. Jennefer.   Patient is 3 days from procedure.   # of paracentesis in last month: 0 # of paracentesis in last 2 months: 0  Current diuretic regimen: n/a Current pharmacologic encephalopathy prophylaxis/treatment: Lactulose  20 g BID  Post-TIPS Imaging: None    Labs: Creatinine: 0.88 Total Bilirubin: 1.1 INR: 1.3 Sodium: 135 Albumin : 3.1 Ammonia: n/a  Assessment: Ricky Rios is a 73 y.o. male with history of Cirrhosis with varices. Patient is 3 days from TIPS creation with coil embolizations of the left gastric and short gastric veins. He also underwent coil-assisted retrograde transvenous obliteration of gastrorenal shunt and varices. I spoke with his wife on the phone today and she states that the patient is doing well with the exception of some abdominal tenderness and fatigue. He is eating and drinking well and doesn't have any lower extremity swelling or shortness of breath. He is taking all of his medications as directed.   Recommendation:  Continue current treatment plan with next Clinic follow up scheduled for 09/07/23.  Electronically Signed: Warren JONELLE Dais, NP 08/31/2023, 3:50 PM

## 2023-09-05 DIAGNOSIS — R809 Proteinuria, unspecified: Secondary | ICD-10-CM | POA: Diagnosis not present

## 2023-09-07 ENCOUNTER — Telehealth (HOSPITAL_COMMUNITY): Payer: Self-pay | Admitting: Student

## 2023-09-07 NOTE — Telephone Encounter (Signed)
Post-TIPS phone call attempted. Voice message left.   Alwyn Ren, AGACNP-BC 09/07/2023, 12:23 PM

## 2023-09-12 ENCOUNTER — Encounter (HOSPITAL_COMMUNITY): Payer: Self-pay | Admitting: Emergency Medicine

## 2023-09-12 ENCOUNTER — Emergency Department (HOSPITAL_COMMUNITY): Payer: Medicare HMO

## 2023-09-12 ENCOUNTER — Telehealth (HOSPITAL_COMMUNITY): Payer: Self-pay | Admitting: Student

## 2023-09-12 ENCOUNTER — Other Ambulatory Visit: Payer: Self-pay

## 2023-09-12 ENCOUNTER — Emergency Department (HOSPITAL_COMMUNITY)
Admission: EM | Admit: 2023-09-12 | Discharge: 2023-09-12 | Discharge status: Against Medical Advice | Payer: Medicare HMO | Attending: Emergency Medicine | Admitting: Emergency Medicine

## 2023-09-12 DIAGNOSIS — R079 Chest pain, unspecified: Secondary | ICD-10-CM | POA: Diagnosis not present

## 2023-09-12 DIAGNOSIS — R0602 Shortness of breath: Secondary | ICD-10-CM | POA: Insufficient documentation

## 2023-09-12 DIAGNOSIS — R0789 Other chest pain: Secondary | ICD-10-CM | POA: Diagnosis not present

## 2023-09-12 DIAGNOSIS — Z5321 Procedure and treatment not carried out due to patient leaving prior to being seen by health care provider: Secondary | ICD-10-CM | POA: Insufficient documentation

## 2023-09-12 DIAGNOSIS — R0689 Other abnormalities of breathing: Secondary | ICD-10-CM | POA: Diagnosis not present

## 2023-09-12 DIAGNOSIS — R9389 Abnormal findings on diagnostic imaging of other specified body structures: Secondary | ICD-10-CM | POA: Diagnosis not present

## 2023-09-12 LAB — CBC WITH DIFFERENTIAL/PLATELET
Abs Immature Granulocytes: 0.03 10*3/uL (ref 0.00–0.07)
Basophils Absolute: 0.1 10*3/uL (ref 0.0–0.1)
Basophils Relative: 1 %
Eosinophils Absolute: 0.7 10*3/uL — ABNORMAL HIGH (ref 0.0–0.5)
Eosinophils Relative: 11 %
HCT: 37.7 % — ABNORMAL LOW (ref 39.0–52.0)
Hemoglobin: 12.5 g/dL — ABNORMAL LOW (ref 13.0–17.0)
Immature Granulocytes: 1 %
Lymphocytes Relative: 19 %
Lymphs Abs: 1.2 10*3/uL (ref 0.7–4.0)
MCH: 30 pg (ref 26.0–34.0)
MCHC: 33.2 g/dL (ref 30.0–36.0)
MCV: 90.6 fL (ref 80.0–100.0)
Monocytes Absolute: 0.5 10*3/uL (ref 0.1–1.0)
Monocytes Relative: 8 %
Neutro Abs: 3.9 10*3/uL (ref 1.7–7.7)
Neutrophils Relative %: 60 %
Platelets: 143 10*3/uL — ABNORMAL LOW (ref 150–400)
RBC: 4.16 MIL/uL — ABNORMAL LOW (ref 4.22–5.81)
RDW: 18.3 % — ABNORMAL HIGH (ref 11.5–15.5)
WBC: 6.3 10*3/uL (ref 4.0–10.5)
nRBC: 0 % (ref 0.0–0.2)

## 2023-09-12 LAB — COMPREHENSIVE METABOLIC PANEL
ALT: 18 U/L (ref 0–44)
AST: 32 U/L (ref 15–41)
Albumin: 3.4 g/dL — ABNORMAL LOW (ref 3.5–5.0)
Alkaline Phosphatase: 79 U/L (ref 38–126)
Anion gap: 9 (ref 5–15)
BUN: 13 mg/dL (ref 8–23)
CO2: 23 mmol/L (ref 22–32)
Calcium: 9.2 mg/dL (ref 8.9–10.3)
Chloride: 109 mmol/L (ref 98–111)
Creatinine, Ser: 1.1 mg/dL (ref 0.61–1.24)
GFR, Estimated: >60 mL/min (ref >60)
Glucose, Bld: 104 mg/dL — ABNORMAL HIGH (ref 70–99)
Potassium: 3.7 mmol/L (ref 3.5–5.1)
Sodium: 141 mmol/L (ref 135–145)
Total Bilirubin: 1.6 mg/dL — ABNORMAL HIGH (ref 0.0–1.2)
Total Protein: 7 g/dL (ref 6.5–8.1)

## 2023-09-12 LAB — URINALYSIS, ROUTINE W REFLEX MICROSCOPIC
Bacteria, UA: NONE SEEN
Bilirubin Urine: NEGATIVE
Glucose, UA: NEGATIVE mg/dL
Hgb urine dipstick: NEGATIVE
Ketones, ur: NEGATIVE mg/dL
Leukocytes,Ua: NEGATIVE
Nitrite: NEGATIVE
Protein, ur: 100 mg/dL — AB
Specific Gravity, Urine: 1.016 (ref 1.005–1.030)
pH: 7 (ref 5.0–8.0)

## 2023-09-12 LAB — BRAIN NATRIURETIC PEPTIDE: B Natriuretic Peptide: 53 pg/mL (ref 0.0–100.0)

## 2023-09-12 LAB — TROPONIN I (HIGH SENSITIVITY)
Troponin I (High Sensitivity): 3 ng/L (ref <18)
Troponin I (High Sensitivity): 3 ng/L (ref <18)

## 2023-09-12 LAB — RESP PANEL BY RT-PCR (RSV, FLU A&B, COVID)  RVPGX2
Influenza A by PCR: NEGATIVE
Influenza B by PCR: NEGATIVE
Resp Syncytial Virus by PCR: NEGATIVE
SARS Coronavirus 2 by RT PCR: NEGATIVE

## 2023-09-12 NOTE — ED Provider Triage Note (Signed)
Emergency Medicine Provider Triage Evaluation Note  Ricky Rios , a 73 y.o. male  was evaluated in triage.  Pt complains of chest pain since last evening.  Describes central sharp chest pain that is fleeting.  Last few seconds.  Denies any neck arm or jaw pain.  No nausea vomiting.  Shortness of breath earlier but denies any currently.  No fever or chills cough, abdominal pain.  Feels "wobbly" with standing.  Does not describe dizziness.  Had TIPS procedure on 08/28/2023  Review of Systems  Positive: Chest pain, shortness of breath, "off balance" with standing Negative: Fever, cough, vomiting , abd pain  Physical Exam  BP (!) 154/91 (BP Location: Right Arm)   Pulse (!) 59   Temp 98.8 F (37.1 C) (Oral)   Resp 16   Ht 6' (1.829 m)   Wt 116.1 kg   SpO2 98%   BMI 34.72 kg/m  Gen:   Awake, no distress   Resp:  Normal effort  MSK:   Moves extremities without difficulty  Other:  Pitting edema bilateral lower extremities  Medical Decision Making  Medically screening exam initiated at 2:34 PM.  Appropriate orders placed.  Ricky Rios was informed that the remainder of the evaluation will be completed by another provider, this initial triage assessment does not replace that evaluation, and the importance of remaining in the ED until their evaluation is complete.     Pauline Aus, PA-C 09/12/23 1440

## 2023-09-12 NOTE — ED Triage Notes (Signed)
Pt reports chest pain started yesterday. Reports intermittent sharp pain of 6/10. Pt reports sob. Pt had a stent placed in his liver Tuesday of last week. Pt reports rust colored urine. Pts skin appears yellow in triage.

## 2023-09-12 NOTE — Telephone Encounter (Signed)
Patient s/p TIPS creation with coil embolizations of the left gastric and short gastric veins as well as coil-assisted retrograde transvenous obliteration of gastrorenal shunt and varices 08/28/23. The patient's wife called IR today with concerns about Ricky Rios having chest pain, shortness of breath and lethargy. I was able to speak with Ricky Rios and the chest pain started yesterday. He rates it a 3/10 and says it it a constant, dull ache. He also feels short of breath and is having difficulty walking. He states he is "stumbling around."  Patient encouraged to go to the ED for further evaluation. The patient and his wife verbalized understanding.   Alwyn Ren, AGACNP-BC 09/12/2023, 11:33 AM

## 2023-09-19 ENCOUNTER — Emergency Department (HOSPITAL_COMMUNITY): Payer: Medicare HMO

## 2023-09-19 ENCOUNTER — Other Ambulatory Visit: Payer: Self-pay

## 2023-09-19 ENCOUNTER — Observation Stay (HOSPITAL_COMMUNITY)
Admission: EM | Admit: 2023-09-19 | Discharge: 2023-09-20 | Disposition: A | Discharge status: Home / Self Care | Payer: Medicare HMO | Attending: Internal Medicine | Admitting: Internal Medicine

## 2023-09-19 ENCOUNTER — Encounter (HOSPITAL_COMMUNITY): Payer: Self-pay

## 2023-09-19 DIAGNOSIS — J449 Chronic obstructive pulmonary disease, unspecified: Secondary | ICD-10-CM | POA: Diagnosis not present

## 2023-09-19 DIAGNOSIS — I350 Nonrheumatic aortic (valve) stenosis: Secondary | ICD-10-CM | POA: Diagnosis not present

## 2023-09-19 DIAGNOSIS — K7682 Hepatic encephalopathy: Secondary | ICD-10-CM | POA: Diagnosis not present

## 2023-09-19 DIAGNOSIS — I6523 Occlusion and stenosis of bilateral carotid arteries: Secondary | ICD-10-CM | POA: Diagnosis not present

## 2023-09-19 DIAGNOSIS — E785 Hyperlipidemia, unspecified: Secondary | ICD-10-CM | POA: Insufficient documentation

## 2023-09-19 DIAGNOSIS — R410 Disorientation, unspecified: Secondary | ICD-10-CM | POA: Diagnosis not present

## 2023-09-19 DIAGNOSIS — D649 Anemia, unspecified: Secondary | ICD-10-CM | POA: Diagnosis not present

## 2023-09-19 DIAGNOSIS — Z79899 Other long term (current) drug therapy: Secondary | ICD-10-CM | POA: Diagnosis not present

## 2023-09-19 DIAGNOSIS — D696 Thrombocytopenia, unspecified: Secondary | ICD-10-CM | POA: Diagnosis not present

## 2023-09-19 DIAGNOSIS — I10 Essential (primary) hypertension: Secondary | ICD-10-CM | POA: Insufficient documentation

## 2023-09-19 DIAGNOSIS — Z95828 Presence of other vascular implants and grafts: Secondary | ICD-10-CM

## 2023-09-19 DIAGNOSIS — K7460 Unspecified cirrhosis of liver: Secondary | ICD-10-CM | POA: Insufficient documentation

## 2023-09-19 DIAGNOSIS — R4182 Altered mental status, unspecified: Principal | ICD-10-CM

## 2023-09-19 DIAGNOSIS — S0990XA Unspecified injury of head, initial encounter: Secondary | ICD-10-CM | POA: Diagnosis not present

## 2023-09-19 DIAGNOSIS — W19XXXA Unspecified fall, initial encounter: Secondary | ICD-10-CM | POA: Diagnosis not present

## 2023-09-19 DIAGNOSIS — R0600 Dyspnea, unspecified: Secondary | ICD-10-CM | POA: Diagnosis not present

## 2023-09-19 DIAGNOSIS — I959 Hypotension, unspecified: Secondary | ICD-10-CM | POA: Diagnosis not present

## 2023-09-19 DIAGNOSIS — I6782 Cerebral ischemia: Secondary | ICD-10-CM | POA: Diagnosis not present

## 2023-09-19 DIAGNOSIS — K5904 Chronic idiopathic constipation: Secondary | ICD-10-CM

## 2023-09-19 DIAGNOSIS — E119 Type 2 diabetes mellitus without complications: Secondary | ICD-10-CM | POA: Insufficient documentation

## 2023-09-19 DIAGNOSIS — F17210 Nicotine dependence, cigarettes, uncomplicated: Secondary | ICD-10-CM | POA: Diagnosis not present

## 2023-09-19 DIAGNOSIS — K7290 Hepatic failure, unspecified without coma: Secondary | ICD-10-CM | POA: Diagnosis not present

## 2023-09-19 LAB — COMPREHENSIVE METABOLIC PANEL
ALT: 23 U/L (ref 0–44)
AST: 41 U/L (ref 15–41)
Albumin: 3.6 g/dL (ref 3.5–5.0)
Alkaline Phosphatase: 65 U/L (ref 38–126)
Anion gap: 10 (ref 5–15)
BUN: 9 mg/dL (ref 8–23)
CO2: 22 mmol/L (ref 22–32)
Calcium: 9.5 mg/dL (ref 8.9–10.3)
Chloride: 110 mmol/L (ref 98–111)
Creatinine, Ser: 1.03 mg/dL (ref 0.61–1.24)
GFR, Estimated: >60 mL/min (ref >60)
Glucose, Bld: 97 mg/dL (ref 70–99)
Potassium: 4.1 mmol/L (ref 3.5–5.1)
Sodium: 142 mmol/L (ref 135–145)
Total Bilirubin: 1.8 mg/dL — ABNORMAL HIGH (ref 0.0–1.2)
Total Protein: 6.9 g/dL (ref 6.5–8.1)

## 2023-09-19 LAB — CBC WITH DIFFERENTIAL/PLATELET
Abs Immature Granulocytes: 0.02 10*3/uL (ref 0.00–0.07)
Basophils Absolute: 0.1 10*3/uL (ref 0.0–0.1)
Basophils Relative: 2 %
Eosinophils Absolute: 0.6 10*3/uL — ABNORMAL HIGH (ref 0.0–0.5)
Eosinophils Relative: 9 %
HCT: 36.1 % — ABNORMAL LOW (ref 39.0–52.0)
Hemoglobin: 12.5 g/dL — ABNORMAL LOW (ref 13.0–17.0)
Immature Granulocytes: 0 %
Lymphocytes Relative: 29 %
Lymphs Abs: 1.8 10*3/uL (ref 0.7–4.0)
MCH: 30.6 pg (ref 26.0–34.0)
MCHC: 34.6 g/dL (ref 30.0–36.0)
MCV: 88.5 fL (ref 80.0–100.0)
Monocytes Absolute: 0.4 10*3/uL (ref 0.1–1.0)
Monocytes Relative: 7 %
Neutro Abs: 3.3 10*3/uL (ref 1.7–7.7)
Neutrophils Relative %: 53 %
Platelets: 133 10*3/uL — ABNORMAL LOW (ref 150–400)
RBC: 4.08 MIL/uL — ABNORMAL LOW (ref 4.22–5.81)
RDW: 19 % — ABNORMAL HIGH (ref 11.5–15.5)
WBC: 6.2 10*3/uL (ref 4.0–10.5)
nRBC: 0 % (ref 0.0–0.2)

## 2023-09-19 LAB — PROTIME-INR
INR: 1.3 — ABNORMAL HIGH (ref 0.8–1.2)
Prothrombin Time: 16.7 s — ABNORMAL HIGH (ref 11.4–15.2)

## 2023-09-19 LAB — AMMONIA: Ammonia: 97 umol/L — ABNORMAL HIGH (ref 9–35)

## 2023-09-19 LAB — APTT: aPTT: 23 s — ABNORMAL LOW (ref 24–36)

## 2023-09-19 MED ORDER — LACTULOSE 10 GM/15ML PO SOLN
30.0000 g | Freq: Once | ORAL | Status: AC
  Administered 2023-09-19: 30 g via ORAL
  Filled 2023-09-19: qty 45

## 2023-09-19 NOTE — ED Notes (Signed)
 Pt had pulled all monitoring off and changed back into his clothes.

## 2023-09-19 NOTE — ED Provider Notes (Signed)
 [place] EMERGENCY DEPARTMENT AT [facility] Provider Note   CSN: [id number] Arrival date & time: 09/19/23  1409     History {Add pertinent medical, surgical, social history, OB history to HPI:1} Chief Complaint  Patient presents with   Altered Mental Status    CURTIS CAIN is a 73 y.o. male.  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   Per EMS, he had an unwitnessed fall on Friday and wife had reported he been confused since then.  He reports he is here because he did not remember what day it was and his wife became concerned.  His wife reports that he has been often not himself.  He reports he missed 1 dose of lactulose.  Nursing reported that he had been taking it as needed.  He reports he has had some diarrhea this morning and it has been dark in color.  Reports he has had some very mild dyspnea that has been ongoing for several weeks and not acutely changed.  Denies any fever, nausea, vomiting, abdominal pain, acute cough or other concerns.   Past Medical History:  Diagnosis Date   Allergy    Phreesia 03/01/2020   Arthritis    Phreesia 03/01/2020   Cataract    Phreesia 03/01/2020   Chest pain    Diagnostic cardiac cath October 2012. Normal coronary arteries, left ventricular function   COPD (chronic obstructive pulmonary disease) (HCC)    Phreesia 03/01/2020   DDD (degenerative disc disease), lumbar    Diabetes mellitus    Diabetes mellitus without complication (HCC)    Phreesia 03/01/2020   Heart murmur    History of back surgery    History of neck surgery    HLD (hyperlipidemia) 12/09/2012   Hyperlipidemia    Phreesia 03/01/2020   Hypertension    Obesity    Right knee injury    Sleep apnea    Smoker      Home Medications Prior to Admission  medications   Medication Sig Start Date End Date Taking? Authorizing Provider  acetaminophen (TYLENOL) 500 MG tablet Take 1,000 mg by mouth every 8 (eight) hours as needed for moderate pain.    [provider]  albuterol (VENTOLIN HFA) 108 (90 Base) MCG/ACT inhaler Inhale 1-2 puffs into the lungs every 4 (four) hours as needed for wheezing or shortness of breath.    [provider]  carvedilol (COREG) 3.125 MG tablet Take 1 tablet (3.125 mg total) by mouth 2 (two) times daily with a meal. 06/01/23   Marguerita Merles, Reuel Boom, MD  Clobetasol Propionate 0.05 % lotion Apply 1 Application topically 2 (two) times daily as needed (psoriasis). 11/24/22   [provider]  doxycycline (MONODOX) 100 MG capsule Take 100 mg by mouth 2 (two) times daily. 08/16/23   [provider]  ezetimibe (ZETIA) 10 MG tablet TAKE 1 TABLET EVERY DAY 08/30/21   Donita Brooks, MD  ipratropium (ATROVENT) 0.03 % nasal spray Place 2 sprays into both nostrils 2 (two) times daily. 08/16/23   [provider]  lactulose (CHRONULAC) 10 GM/15ML solution Take 30 mLs (20 g total) by mouth 2 (two) times daily. Patient taking differently: Take 20 g by mouth daily as needed for moderate constipation. 05/10/23   [name], MD  omeprazole (PRILOSEC) 40 MG capsule  Take 1 capsule (40 mg total) by mouth daily. 06/01/23   [name], MD  vitamin E 180 MG (400 UNITS) capsule Take 400 Units by mouth daily.    [provider]  zinc gluconate 50 MG tablet Take 50 mg by mouth daily.    [provider]      Allergies    Lipitor [atorvastatin calcium], Sulfa antibiotics, and Zocor [simvastatin - high dose]    Review of Systems   Review of Systems  Physical Exam Updated Vital Signs BP (!) 153/78 (BP Location: Right Arm)   Pulse (!) 59   Temp 98 F (36.7 C) (Oral)   Resp 18   SpO2 100%  Physical Exam Vitals and nursing note reviewed.  Constitutional:       General: He is not in acute distress.    Appearance: Normal appearance. He is well-developed. He is not ill-appearing or diaphoretic.  HENT:     Head: Normocephalic and atraumatic.  Eyes:     General: No visual field deficit.    Extraocular Movements: Extraocular movements intact.     Conjunctiva/sclera: Conjunctivae normal.     Pupils: Pupils are equal, round, and reactive to light.  Cardiovascular:     Rate and Rhythm: Normal rate and regular rhythm.     Pulses: Normal pulses.     Heart sounds: Normal heart sounds. No murmur heard.    No friction rub. No gallop.  Pulmonary:     Effort: Pulmonary effort is normal. No respiratory distress.     Breath sounds: Normal breath sounds. No wheezing or rales.  Abdominal:     General: There is no distension.     Palpations: Abdomen is soft.     Tenderness: There is no abdominal tenderness. There is no guarding.  Musculoskeletal:        General: No swelling or tenderness.     Cervical back: Normal range of motion.  Skin:    General: Skin is warm and dry.     Findings: No erythema or rash.  Neurological:     General: No focal deficit present.     Mental Status: He is alert.     GCS: GCS eye subscore is 4. GCS verbal subscore is 5. GCS motor subscore is 6.     Cranial Nerves: No cranial nerve deficit, dysarthria or facial asymmetry.     Sensory: No sensory deficit.     Motor: No weakness or tremor.     Coordination: Coordination normal. Finger-Nose-Finger Test normal.     Gait: Gait normal.     Comments: Oriented to location, self not date. Confusion when asked date states birthdate. Does answer some questions appropriately but appears sleepy and confused  at times Noted asterixis      ED Results / Procedures / Treatments   Labs (all labs ordered are listed, but only abnormal results are displayed) Labs Reviewed  COMPREHENSIVE METABOLIC PANEL - Abnormal; Notable for the following components:      Result Value   Total Bilirubin  1.8 (*)    All other components within normal limits  PROTIME-INR - Abnormal; Notable for the following components:   Prothrombin Time 16.7 (*)    INR 1.3 (*)    All other components within normal limits  APTT - Abnormal; Notable for the following components:   aPTT 23 (*)    All other components within normal limits  AMMONIA - Abnormal; Notable for the following components:   Ammonia 97 (*)  All other components within normal limits  CBC WITH DIFFERENTIAL/PLATELET  CBC WITH DIFFERENTIAL/PLATELET    EKG None  Radiology No results found.  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  Differential diagnosis includes encephalopathy secondary to infection, metabolic or hepatic encephalopathy, medication effect, ICH, anemia, arrhythmia.   Labs completed and personally evaluated and interpreted by me show no clinically significant electrolyte abnormalities, normal transaminases, INR 1.3, CBC was no leukocytosis, hemoglobin stable at 12.5, similar thrombocytopenia to previous.  He did have bowel movement while in the emergency department which was brown in color and not consistent with acute upper GI bleed.  Chest x-ray completed to evaluate for underlying pneumonia or other abnormalities was evaluated interpreted by me and radiology shows no acute findings in comparison to prior.  CT head completed given trauma and AMS and personally evaluated and interpreted by me shows***  Ammonia is  elevated to 97 without any prior.  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  He was given lactulose in the emergency department and wife reports he has had some improvement but is not back to his baseline.  Will plan on admission.   {Document critical care time when appropriate:1} {Document review of labs and clinical decision tools ie heart score, Chads2Vasc2 etc:1}  {Document your independent review of radiology images, and any outside records:1} {Document your discussion with family members, caretakers, and with consultants:1} {Document social determinants of health affecting pt's care:1} {Document your decision making why or why not admission, treatments were needed:1} Final Clinical Impression(s) / ED Diagnoses Final diagnoses:  None    Rx / DC Orders ED Discharge Orders     None

## 2023-09-19 NOTE — ED Notes (Signed)
 Pt going to CT

## 2023-09-19 NOTE — ED Notes (Signed)
 Pt's wife said she is going to take pt home. Provider aware and bedside.

## 2023-09-19 NOTE — ED Notes (Signed)
 Assisted pt to the bathroom via wheelchair.  Pt unhappy about the wait. RN explained that we are waiting for the results of the head CT.  Provider aware.

## 2023-09-19 NOTE — ED Notes (Signed)
 Pt was able to have several BM in bathroom.  He is attempting to leave as he wants to be with his wife.  Called his wife (pt did not recall phone number) and she states that she will come.

## 2023-09-19 NOTE — ED Triage Notes (Addendum)
 Pt arrives via EMS from home. Wife reported to EMS that the patient has had confusion since Friday. Pt was found sitting in his car for about 1.5 hours by his wife. EMS states that the patient did have an unwitnessed fall on Friday. Pt is oriented to name, dob and knows that he is at the hospital. Pt unsure the current date.

## 2023-09-19 NOTE — ED Provider Triage Note (Signed)
 Emergency Medicine Provider Triage Evaluation Note  Ricky Rios , a 73 y.o. male  was evaluated in triage.  Pt complains of altered mental status was reported by EMS.  Patient reportedly had multiple falls recently.  Patient had TIPS procedure earlier this month..  Review of Systems  Positive:  Negative:   Physical Exam  BP (!) 153/78 (BP Location: Right Arm)   Pulse (!) 59   Temp 98 F (36.7 C) (Oral)   Resp 18   SpO2 100%  Gen:   Awake, no distress   Resp:  Normal effort  MSK:   Moves extremities without difficulty  Other:    Medical Decision Making  Medically screening exam initiated at 2:19 PM.  Appropriate orders placed.  Ricky Rios was informed that the remainder of the evaluation will be completed by another provider, this initial triage assessment does not replace that evaluation, and the importance of remaining in the ED until their evaluation is complete.  Recent TIPS procedure, altered mental status.  Ammonia ordered.  CT imaging of the head ordered.  Patient pleasant, is more confused than reported baseline.  Knows name, date of birth and knows he is at the hospital.  Attempted to check for asterixis on the patient, however I think he did not quite understand what I was doing and kept trying to forcibly push his hand down.   [name] T, DO 09/19/23 1420

## 2023-09-19 NOTE — ED Notes (Signed)
 EDP at bedside

## 2023-09-19 NOTE — ED Notes (Signed)
 CBC clotted, please redraw

## 2023-09-20 ENCOUNTER — Observation Stay (HOSPITAL_COMMUNITY): Payer: Medicare HMO

## 2023-09-20 ENCOUNTER — Encounter (HOSPITAL_COMMUNITY): Payer: Self-pay | Admitting: Internal Medicine

## 2023-09-20 DIAGNOSIS — K769 Liver disease, unspecified: Secondary | ICD-10-CM | POA: Diagnosis not present

## 2023-09-20 DIAGNOSIS — I81 Portal vein thrombosis: Secondary | ICD-10-CM | POA: Diagnosis not present

## 2023-09-20 DIAGNOSIS — K8020 Calculus of gallbladder without cholecystitis without obstruction: Secondary | ICD-10-CM | POA: Diagnosis not present

## 2023-09-20 DIAGNOSIS — I10 Essential (primary) hypertension: Secondary | ICD-10-CM

## 2023-09-20 DIAGNOSIS — R109 Unspecified abdominal pain: Secondary | ICD-10-CM | POA: Diagnosis not present

## 2023-09-20 DIAGNOSIS — K7689 Other specified diseases of liver: Secondary | ICD-10-CM | POA: Diagnosis not present

## 2023-09-20 DIAGNOSIS — K7682 Hepatic encephalopathy: Secondary | ICD-10-CM

## 2023-09-20 DIAGNOSIS — K7460 Unspecified cirrhosis of liver: Secondary | ICD-10-CM

## 2023-09-20 LAB — HEPATIC FUNCTION PANEL
ALT: 20 U/L (ref 0–44)
AST: 35 U/L (ref 15–41)
Albumin: 3.1 g/dL — ABNORMAL LOW (ref 3.5–5.0)
Alkaline Phosphatase: 58 U/L (ref 38–126)
Bilirubin, Direct: 0.5 mg/dL — ABNORMAL HIGH (ref 0.0–0.2)
Indirect Bilirubin: 1.5 mg/dL — ABNORMAL HIGH (ref 0.3–0.9)
Total Bilirubin: 2 mg/dL — ABNORMAL HIGH (ref 0.0–1.2)
Total Protein: 6.3 g/dL — ABNORMAL LOW (ref 6.5–8.1)

## 2023-09-20 LAB — CBC
HCT: 34 % — ABNORMAL LOW (ref 39.0–52.0)
Hemoglobin: 11.9 g/dL — ABNORMAL LOW (ref 13.0–17.0)
MCH: 31.2 pg (ref 26.0–34.0)
MCHC: 35 g/dL (ref 30.0–36.0)
MCV: 89.2 fL (ref 80.0–100.0)
Platelets: 100 10*3/uL — ABNORMAL LOW (ref 150–400)
RBC: 3.81 MIL/uL — ABNORMAL LOW (ref 4.22–5.81)
RDW: 19.1 % — ABNORMAL HIGH (ref 11.5–15.5)
WBC: 6.7 10*3/uL (ref 4.0–10.5)
nRBC: 0 % (ref 0.0–0.2)

## 2023-09-20 LAB — CBC WITH DIFFERENTIAL/PLATELET
Abs Immature Granulocytes: 0.01 10*3/uL (ref 0.00–0.07)
Basophils Absolute: 0.2 10*3/uL — ABNORMAL HIGH (ref 0.0–0.1)
Basophils Relative: 2 %
Eosinophils Absolute: 0.7 10*3/uL — ABNORMAL HIGH (ref 0.0–0.5)
Eosinophils Relative: 11 %
HCT: 34.1 % — ABNORMAL LOW (ref 39.0–52.0)
Hemoglobin: 11.7 g/dL — ABNORMAL LOW (ref 13.0–17.0)
Immature Granulocytes: 0 %
Lymphocytes Relative: 32 %
Lymphs Abs: 2 10*3/uL (ref 0.7–4.0)
MCH: 30.5 pg (ref 26.0–34.0)
MCHC: 34.3 g/dL (ref 30.0–36.0)
MCV: 89 fL (ref 80.0–100.0)
Monocytes Absolute: 0.5 10*3/uL (ref 0.1–1.0)
Monocytes Relative: 7 %
Neutro Abs: 3 10*3/uL (ref 1.7–7.7)
Neutrophils Relative %: 48 %
Platelets: 100 10*3/uL — ABNORMAL LOW (ref 150–400)
RBC: 3.83 MIL/uL — ABNORMAL LOW (ref 4.22–5.81)
RDW: 19 % — ABNORMAL HIGH (ref 11.5–15.5)
WBC: 6.2 10*3/uL (ref 4.0–10.5)
nRBC: 0 % (ref 0.0–0.2)

## 2023-09-20 LAB — IRON AND TIBC
Iron: 114 ug/dL (ref 45–182)
Saturation Ratios: 45 % — ABNORMAL HIGH (ref 17.9–39.5)
TIBC: 253 ug/dL (ref 250–450)
UIBC: 139 ug/dL

## 2023-09-20 LAB — FERRITIN: Ferritin: 202 ng/mL (ref 24–336)

## 2023-09-20 LAB — BASIC METABOLIC PANEL
Anion gap: 10 (ref 5–15)
BUN: 9 mg/dL (ref 8–23)
CO2: 21 mmol/L — ABNORMAL LOW (ref 22–32)
Calcium: 9.1 mg/dL (ref 8.9–10.3)
Chloride: 109 mmol/L (ref 98–111)
Creatinine, Ser: 0.95 mg/dL (ref 0.61–1.24)
GFR, Estimated: >60 mL/min (ref >60)
Glucose, Bld: 94 mg/dL (ref 70–99)
Potassium: 3.4 mmol/L — ABNORMAL LOW (ref 3.5–5.1)
Sodium: 140 mmol/L (ref 135–145)

## 2023-09-20 LAB — FOLATE: Folate: 13.5 ng/mL (ref >5.9)

## 2023-09-20 LAB — RETICULOCYTES
Immature Retic Fract: 16.6 % — ABNORMAL HIGH (ref 2.3–15.9)
RBC.: 3.87 MIL/uL — ABNORMAL LOW (ref 4.22–5.81)
Retic Count, Absolute: 150.9 10*3/uL (ref 19.0–186.0)
Retic Ct Pct: 3.9 % — ABNORMAL HIGH (ref 0.4–3.1)

## 2023-09-20 LAB — CBG MONITORING, ED: Glucose-Capillary: 93 mg/dL (ref 70–99)

## 2023-09-20 LAB — VITAMIN B12: Vitamin B-12: 780 pg/mL (ref 180–914)

## 2023-09-20 MED ORDER — POTASSIUM CHLORIDE 20 MEQ PO PACK
40.0000 meq | PACK | Freq: Once | ORAL | Status: AC
Start: 2023-09-20 — End: 2023-09-20
  Administered 2023-09-20: 40 meq via ORAL
  Filled 2023-09-20: qty 2

## 2023-09-20 MED ORDER — LACTULOSE 10 GM/15ML PO SOLN
30.0000 g | Freq: Three times a day (TID) | ORAL | Status: DC
  Administered 2023-09-20: 30 g via ORAL
  Filled 2023-09-20: qty 45

## 2023-09-20 MED ORDER — CARVEDILOL 3.125 MG PO TABS
3.1250 mg | ORAL_TABLET | Freq: Two times a day (BID) | ORAL | Status: DC
  Administered 2023-09-20: 3.125 mg via ORAL
  Filled 2023-09-20: qty 1

## 2023-09-20 MED ORDER — HEPARIN SODIUM (PORCINE) 5000 UNIT/ML IJ SOLN
5000.0000 [IU] | Freq: Three times a day (TID) | INTRAMUSCULAR | Status: DC
  Administered 2023-09-20: 5000 [IU] via SUBCUTANEOUS
  Filled 2023-09-20: qty 1

## 2023-09-20 MED ORDER — PANTOPRAZOLE SODIUM 40 MG PO TBEC
40.0000 mg | DELAYED_RELEASE_TABLET | Freq: Every day | ORAL | Status: DC
  Administered 2023-09-20: 40 mg via ORAL
  Filled 2023-09-20: qty 1

## 2023-09-20 MED ORDER — IOHEXOL 350 MG/ML SOLN
75.0000 mL | Freq: Once | INTRAVENOUS | Status: AC | PRN
  Administered 2023-09-20: 75 mL via INTRAVENOUS

## 2023-09-20 MED ORDER — ALBUTEROL SULFATE (2.5 MG/3ML) 0.083% IN NEBU: 3.0000 mL | INHALATION_SOLUTION | RESPIRATORY_TRACT | Status: DC | PRN

## 2023-09-20 MED ORDER — LACTULOSE 10 GM/15ML PO SOLN: 20.0000 g | Freq: Three times a day (TID) | ORAL | Status: DC

## 2023-09-20 MED ORDER — EZETIMIBE 10 MG PO TABS
10.0000 mg | ORAL_TABLET | Freq: Every day | ORAL | Status: DC
  Administered 2023-09-20: 10 mg via ORAL
  Filled 2023-09-20: qty 1

## 2023-09-20 NOTE — ED Notes (Signed)
 Pt states that wife went outside to smoke a little while ago. Attempted to call wife regarding pt discharge with no answer at this time. Will attempt to call again shortly.

## 2023-09-20 NOTE — ED Notes (Signed)
 Patient transported to Ultrasound

## 2023-09-20 NOTE — H&P (Signed)
 History and Physical    Ricky Rios UXL:[id number] DOB: [birth year] DOA: 09/19/2023  Patient coming from: Home.  Chief Complaint: Confusion.  HPI: Ricky Rios is a 73 y.o. male with history of liver cirrhosis secondary to Elita Boone status post TIPS on 08/28/2023 was brought to the ER by patient's wife as patient was found to be increasingly confused.  Patient's wife states that they are in the process of moving to a new house and patient has not taken his lactulose almost for 3 to 4 days.  Also has been complaining of increasing left upper quadrant pain but denies any fever chills nausea vomiting diarrhea.  Did not have any falls.  ED Course: In the ER CT head is unremarkable.  On exam patient has some asterixis and also ammonia level was 97.  Patient was afebrile.  Total bilirubin was 1.8 INR is 1.3 platelets 133.  Patient was given lactulose and admitted for further management of her hepatic encephalopathy.  Review of Systems: As per HPI, rest all negative.   Past Medical History:  Diagnosis Date   Allergy    Phreesia 03/01/2020   Arthritis    Phreesia 03/01/2020   Cataract    Phreesia 03/01/2020   Chest pain    Diagnostic cardiac cath October 2012. Normal coronary arteries, left ventricular function   COPD (chronic obstructive pulmonary disease) (HCC)    Phreesia 03/01/2020   DDD (degenerative disc disease), lumbar    Diabetes mellitus    Diabetes mellitus without complication (HCC)    Phreesia 03/01/2020   Heart murmur    History of back surgery    History of neck surgery    HLD (hyperlipidemia) 12/09/2012   Hyperlipidemia    Phreesia 03/01/2020   Hypertension    Obesity    Right knee injury    Sleep apnea    Smoker     Past Surgical History:  Procedure Laterality Date   APPENDECTOMY     BACK SURGERY     for ruptured disc lumbar area   BIOPSY  06/01/2023   Procedure: BIOPSY;  Surgeon: [name], MD;  Location: [facility];  Service:  Gastroenterology;;   CARDIAC CATHETERIZATION  Oct 2012   nml coronary arteries, aorta, LV fcn   COLONOSCOPY WITH PROPOFOL N/A 01/31/2023   Procedure: COLONOSCOPY WITH PROPOFOL;  Surgeon: [name], MD;  Location: [facility];  Service: Gastroenterology;  Laterality: N/A;  12:45pm;ASA 1-2   ESOPHAGOGASTRODUODENOSCOPY (EGD) WITH PROPOFOL N/A 06/01/2023   Procedure: ESOPHAGOGASTRODUODENOSCOPY (EGD) WITH PROPOFOL;  Surgeon: [name], MD;  Location: [facility];  Service: Gastroenterology;  Laterality: N/A;  9:15AM;ASA 3   EYE SURGERY N/A    Phreesia 03/01/2020   HEMOSTASIS CLIP PLACEMENT  01/31/2023   Procedure: HEMOSTASIS CLIP PLACEMENT;  Surgeon: [name], MD;  Location: [facility];  Service: Gastroenterology;;   I & D EXTREMITY Right 02/06/2013   Procedure: IRRIGATION AND DEBRIDEMENT INDEX FINGER;  Surgeon: Tami Ribas, MD;  Location: [facility];  Service: Orthopedics;  Laterality: Right;   IR ANGIOGRAM SELECTIVE EACH ADDITIONAL VESSEL  08/28/2023   IR ANGIOGRAM SELECTIVE EACH ADDITIONAL VESSEL  08/28/2023   IR EMBO ART  VEN HEMORR LYMPH EXTRAV  INC GUIDE ROADMAPPING  08/28/2023   IR EMBO ART  VEN HEMORR LYMPH EXTRAV  INC GUIDE ROADMAPPING  08/28/2023   IR EMBO VENOUS NOT HEMORR HEMANG  INC GUIDE ROADMAPPING  08/28/2023   IR INTRAVASCULAR ULTRASOUND NON CORONARY  08/28/2023  IR RADIOLOGIST EVAL & MGMT  06/20/2023   IR RADIOLOGIST EVAL & MGMT  08/10/2023   IR TIPS  08/28/2023   IR US GUIDE VASC ACCESS RIGHT  08/28/2023   IR US GUIDE VASC ACCESS RIGHT  08/28/2023   NECK SURGERY     for ruptured disc   POLYPECTOMY  01/31/2023   Procedure: POLYPECTOMY;  Surgeon: [name], MD;  Location: [facility];  Service: Gastroenterology;;   SPINE SURGERY N/A    Phreesia 03/01/2020   TIPS PROCEDURE N/A 08/28/2023   Procedure: TRANS-JUGULAR INTRAHEPATIC PORTAL SHUNT (TIPS);  Surgeon: Bennie Dallas, MD;  Location[facility];  Service: Radiology;  Laterality:  N/A;   TONSILLECTOMY       reports that he has been smoking cigarettes. He has never used smokeless tobacco. He reports that he does not drink alcohol and does not use drugs.  Allergies  Allergen Reactions   Lipitor [Atorvastatin Calcium] Other (See Comments)    shaking   Sulfa Antibiotics Other (See Comments)    unknown   Zocor [Simvastatin - High Dose] Other (See Comments)    shaking    Family History  Problem Relation Age of Onset   Dementia Mother    Heart disease Father     Prior to Admission medications   Medication Sig Start Date End Date Taking? Authorizing Provider  acetaminophen (TYLENOL) 500 MG tablet Take 1,000 mg by mouth every 8 (eight) hours as needed for moderate pain.    [provider]  albuterol (VENTOLIN HFA) 108 (90 Base) MCG/ACT inhaler Inhale 1-2 puffs into the lungs every 4 (four) hours as needed for wheezing or shortness of breath.    [provider]  carvedilol (COREG) 3.125 MG tablet Take 1 tablet (3.125 mg total) by mouth 2 (two) times daily with a meal. 06/01/23   Marguerita Merles, Reuel Boom, MD  Clobetasol Propionate 0.05 % lotion Apply 1 Application topically 2 (two) times daily as needed (psoriasis). 11/24/22   [provider]  doxycycline (MONODOX) 100 MG capsule Take 100 mg by mouth 2 (two) times daily. 08/16/23   [provider]  ezetimibe (ZETIA) 10 MG tablet TAKE 1 TABLET EVERY DAY 08/30/21   Donita Brooks, MD  ipratropium (ATROVENT) 0.03 % nasal spray Place 2 sprays into both nostrils 2 (two) times daily. 08/16/23   [provider]  lactulose (CHRONULAC) 10 GM/15ML solution Take 30 mLs (20 g total) by mouth 2 (two) times daily. Patient taking differently: Take 20 g by mouth daily as needed for moderate constipation. 05/10/23   [name], MD  omeprazole (PRILOSEC) 40 MG capsule Take 1 capsule (40 mg total) by mouth daily. 06/01/23   [name], MD  vitamin E 180 MG (400 UNITS)  capsule Take 400 Units by mouth daily.    [provider]  zinc gluconate 50 MG tablet Take 50 mg by mouth daily.    [provider]    Physical Exam: Constitutional: Moderately built and nourished. Vitals:   09/19/23 1714 09/19/23 1801 09/19/23 1930 09/19/23 2300  BP: (!) 166/51  (!) 170/67 (!) 170/65  Pulse: 60  67 (!) 105  Resp: 17  (!) 21 14  Temp:  98.5 F (36.9 C)  98.2 F (36.8 C)  TempSrc:  Axillary    SpO2: 97%  96% 99%   Eyes: Mild icterus present.  No pallor. ENMT: No discharge from the ears eyes nose or mouth. Neck: No mass felt.  No neck  rigidity. Respiratory: No rhonchi or crepitations. Cardiovascular: S1-S2 heard. Abdomen: Soft nontender bowel sound present. Musculoskeletal: No edema. Skin: No rash. Neurologic: Alert awake oriented to time place and person but appears confused.  Moves all extremities. Psychiatric: Appears mildly confused.   Labs on Admission: I have personally reviewed following labs and imaging studies  CBC: Recent Labs  Lab 09/19/23 1617  WBC 6.2  NEUTROABS 3.3  HGB 12.5*  HCT 36.1*  MCV 88.5  PLT 133*   Basic Metabolic Panel: Recent Labs  Lab 09/19/23 1418  NA 142  K 4.1  CL 110  CO2 22  GLUCOSE 97  BUN 9  CREATININE 1.03  CALCIUM 9.5   GFR: Estimated Creatinine Clearance: 86.5 mL/min (by C-G formula based on SCr of 1.03 mg/dL). Liver Function Tests: Recent Labs  Lab 09/19/23 1418  AST 41  ALT 23  ALKPHOS 65  BILITOT 1.8*  PROT 6.9  ALBUMIN 3.6   No results for input(s): "LIPASE", "AMYLASE" in the last 168 hours. Recent Labs  Lab 09/19/23 1430  AMMONIA 97*   Coagulation Profile: Recent Labs  Lab 09/19/23 1418  INR 1.3*   Cardiac Enzymes: No results for input(s): "CKTOTAL", "CKMB", "CKMBINDEX", "TROPONINI" in the last 168 hours. BNP (last 3 results) No results for input(s): "PROBNP" in the last 8760 hours. HbA1C: No results for input(s): "HGBA1C" in the last 72 hours. CBG: No  results for input(s): "GLUCAP" in the last 168 hours. Lipid Profile: No results for input(s): "CHOL", "HDL", "LDLCALC", "TRIG", "CHOLHDL", "LDLDIRECT" in the last 72 hours. Thyroid Function Tests: No results for input(s): "TSH", "T4TOTAL", "FREET4", "T3FREE", "THYROIDAB" in the last 72 hours. Anemia Panel: No results for input(s): "VITAMINB12", "FOLATE", "FERRITIN", "TIBC", "IRON", "RETICCTPCT" in the last 72 hours. Urine analysis:    Component Value Date/Time   COLORURINE YELLOW 09/12/2023 1410   APPEARANCEUR CLEAR 09/12/2023 1410   LABSPEC 1.016 09/12/2023 1410   PHURINE 7.0 09/12/2023 1410   GLUCOSEU NEGATIVE 09/12/2023 1410   HGBUR NEGATIVE 09/12/2023 1410   BILIRUBINUR NEGATIVE 09/12/2023 1410   KETONESUR NEGATIVE 09/12/2023 1410   PROTEINUR 100 (A) 09/12/2023 1410   UROBILINOGEN 0.2 01/31/2014 2306   NITRITE NEGATIVE 09/12/2023 1410   LEUKOCYTESUR NEGATIVE 09/12/2023 1410   Sepsis Labs: @LABRCNTIP (procalcitonin:4,lacticidven:4) ) Recent Results (from the past 240 hours)  Resp panel by RT-PCR (RSV, Flu A&B, Covid) Anterior Nasal Swab     Status: None   Collection Time: 09/12/23 12:45 PM   Specimen: Anterior Nasal Swab  Result Value Ref Range Status   SARS Coronavirus 2 by RT PCR NEGATIVE NEGATIVE Final    Comment: (NOTE) SARS-CoV-2 target nucleic acids are NOT DETECTED.  The SARS-CoV-2 RNA is generally detectable in upper respiratory specimens during the acute phase of infection. The lowest concentration of SARS-CoV-2 viral copies this assay can detect is 138 copies/mL. A negative result does not preclude SARS-Cov-2 infection and should not be used as the sole basis for treatment or other patient management decisions. A negative result may occur with  improper specimen collection/handling, submission of specimen other than nasopharyngeal swab, presence of viral mutation(s) within the areas targeted by this assay, and inadequate number of viral copies(<138 copies/mL).  A negative result must be combined with clinical observations, patient history, and epidemiological information. The expected result is Negative.  Fact Sheet for Patients:  BloggerCourse.com  Fact Sheet for Healthcare Providers:  SeriousBroker.it  This test is no t yet approved or cleared by the [place] and  has been authorized for  detection and/or diagnosis of SARS-CoV-2 by FDA under an Emergency Use Authorization (EUA). This EUA will remain  in effect (meaning this test can be used) for the duration of the COVID-19 declaration under Section 564(b)(1) of the Act, 21 U.S.C.section 360bbb-3(b)(1), unless the authorization is terminated  or revoked sooner.       Influenza A by PCR NEGATIVE NEGATIVE Final   Influenza B by PCR NEGATIVE NEGATIVE Final    Comment: (NOTE) The Xpert Xpress SARS-CoV-2/FLU/RSV plus assay is intended as an aid in the diagnosis of influenza from Nasopharyngeal swab specimens and should not be used as a sole basis for treatment. Nasal washings and aspirates are unacceptable for Xpert Xpress SARS-CoV-2/FLU/RSV testing.  Fact Sheet for Patients: BloggerCourse.com  Fact Sheet for Healthcare Providers: SeriousBroker.it  This test is not yet approved or cleared by the [place] FDA and has been authorized for detection and/or diagnosis of SARS-CoV-2 by FDA under an Emergency Use Authorization (EUA). This EUA will remain in effect (meaning this test can be used) for the duration of the COVID-19 declaration under Section 564(b)(1) of the Act, 21 U.S.C. section 360bbb-3(b)(1), unless the authorization is terminated or revoked.     Resp Syncytial Virus by PCR NEGATIVE NEGATIVE Final    Comment: (NOTE) Fact Sheet for Patients: BloggerCourse.com  Fact Sheet for Healthcare  Providers: SeriousBroker.it  This test is not yet approved or cleared by the [place] FDA and has been authorized for detection and/or diagnosis of SARS-CoV-2 by FDA under an Emergency Use Authorization (EUA). This EUA will remain in effect (meaning this test can be used) for the duration of the COVID-19 declaration under Section 564(b)(1) of the Act, 21 U.S.C. section 360bbb-3(b)(1), unless the authorization is terminated or revoked.  Performed at [facility], [address], [place], [place] [address]      Radiological Exams on Admission: CT Head Wo Contrast Result Date: 09/19/2023 CLINICAL DATA:  Trauma EXAM: CT HEAD WITHOUT CONTRAST TECHNIQUE: Contiguous axial images were obtained from the base of the skull through the vertex without intravenous contrast. RADIATION DOSE REDUCTION: This exam was performed according to the departmental dose-optimization program which includes automated exposure control, adjustment of the mA and/or kV according to patient size and/or use of iterative reconstruction technique. COMPARISON:  None Available. FINDINGS: Brain: No evidence of acute infarction, hemorrhage, hydrocephalus, extra-axial collection or mass lesion/mass effect. There is mild patchy periventricular and deep white matter hypodensity, likely chronic small vessel ischemic change. Vascular: Atherosclerotic calcifications are present within the cavernous internal carotid arteries. Skull: Normal. Negative for fracture or focal lesion. Sinuses/Orbits: No acute finding. Other: None. IMPRESSION: 1. No acute intracranial process. 2. Mild chronic small vessel ischemic change. Electronically Signed   By: Darliss Cheney M.D.   On: 09/19/2023 23:15   DG Chest 2 View Result Date: 09/19/2023 CLINICAL DATA:  Altered mental status, dyspnea. EXAM: CHEST - 2 VIEW COMPARISON:  September 12, 2023. FINDINGS: The heart size and mediastinal contours are within normal limits. Both lungs are  clear. The visualized skeletal structures are unremarkable. IMPRESSION: No active cardiopulmonary disease. Electronically Signed   By: Lupita Raider M.D.   On: 09/19/2023 18:14    EKG: Independently reviewed.  Normal sinus rhythm.  Assessment/Plan Principal Problem:   Hepatic encephalopathy (HCC) Active Problems:   Diabetes mellitus without complication (HCC)   Hypertension   HLD (hyperlipidemia)   Thrombocytopenia (HCC)   Aortic stenosis   Cirrhosis (HCC)   S/P TIPS (transjugular intrahepatic portosystemic shunt)  Hepatic encephalopathy with history of liver cirrhosis status post recent TIPS on 08/28/2023 -   patient had not taken his scheduled dose of lactulose likely precipitating cause.  Will keep patient on lactulose 30 g p.o. 3 times daily.  Since patient is complaining of increasing pain in the left upper quadrant CT abdomen pelvis has been ordered.  Will check Dopplers of the TIPS to check for gradients.  And since patient had recent TIPS with hepatic encephalopathy will consult gastroenterologist.  MELD score of 12. Hypertension on carvedilol. Hyperlipidemia on Zetia. Anemia appears to be chronic follow CBC.  Anemia panel. Thrombocytopenia likely from cirrhosis.  Follow CBC. History of tobacco abuse and COPD mentioned in the chart.  As needed albuterol.  Not wheezing at this time.  Since patient has hepatic encephalopathy will need close monitoring and more than 2 midnight stay.   DVT prophylaxis: Heparin. Code Status: Full code. Family Communication: Patient's wife. Disposition Plan: Medical floor. Consults called: Gastroenterologist. Admission status: Observation.

## 2023-09-20 NOTE — Discharge Summary (Signed)
 Physician Discharge Summary   Patient: Ricky Rios MRN: [id number] DOB: 09-06-1950  Admit date:     09/19/2023  Discharge date: 09/20/23  Discharge Physician: Dorcas Carrow   PCP: Irven Coe, MD   Recommendations at discharge:  Take lactulose 30 g 3 times a day and as was needed to have 2-3 loose bowel movements a day.   Discharge Diagnoses: Principal Problem:   Hepatic encephalopathy (HCC) Active Problems:   Diabetes mellitus without complication (HCC)   Hypertension   HLD (hyperlipidemia)   Thrombocytopenia (HCC)   Aortic stenosis   Cirrhosis (HCC)   S/P TIPS (transjugular intrahepatic portosystemic shunt)  Resolved Problems:   * No resolved hospital problems. *  Hospital Course: 73 year old male with history of liver cirrhosis secondary to NASH, status post TIPS on 08/28/2023 with no clear instructions on lactulose only taking for constipation comes back to the emergency room with 2 to 3 days of increasing lethargy and confusion.  Apparently, they were moving to a new apartment so in order to avoid going to the bathroom he did not take any lactulose for the last 4 days.  In the emergency room hemodynamically stable.  Ammonia was 97.  CT scan of the head was normal.  CT scan abdomen pelvis was without any acute findings.  INR 1.3.  Platelets 133.  Patient was given 2 doses of lactulose 30 g with frequent bowel movements overnight.  Seen in the morning rounds.  He is asymptomatic.  No tremors.  Mobilizing around.  Plan: Currently symptoms improved.  Right upper quadrant ultrasound is done to look for TIPS, the results are pending.  He is due to follow-up at radiology next week and they can follow-up the results. Extensive discussion done, counseling provided and return instructions provided to use lactulose with goal of making 2-3 loose bowel movements a day without interruption. Currently no indication for additional antibiotics as per GI. Stable to discharge home with  outpatient follow-up. Resume long-term medications.        Consultants: Gastroenterology Procedures performed: CT scan abdomen pelvis.  Right upper quadrant ultrasound. Disposition: Home Diet recommendation:  Discharge Diet Orders (From admission, onward)     Start     Ordered   09/20/23 0000  Diet - low sodium heart healthy        09/20/23 1009           Cardiac diet DISCHARGE MEDICATION: Allergies as of 09/20/2023       Reactions   Lipitor [atorvastatin Calcium] Other (See Comments)   shaking   Sulfa Antibiotics Other (See Comments)   unknown   Zocor [simvastatin - High Dose] Other (See Comments)   shaking        Medication List     STOP taking these medications    doxycycline 100 MG capsule Commonly known as: MONODOX   predniSONE 50 MG tablet Commonly known as: DELTASONE       TAKE these medications    acetaminophen 500 MG tablet Commonly known as: TYLENOL Take 1,000 mg by mouth every 8 (eight) hours as needed for moderate pain.   albuterol 108 (90 Base) MCG/ACT inhaler Commonly known as: VENTOLIN HFA Inhale 1-2 puffs into the lungs every 4 (four) hours as needed for wheezing or shortness of breath.   carvedilol 3.125 MG tablet Commonly known as: Coreg Take 1 tablet (3.125 mg total) by mouth 2 (two) times daily with a meal.   Clobetasol Propionate 0.05 % lotion Apply 1 Application topically as needed (  psoriasis).   ezetimibe 10 MG tablet Commonly known as: ZETIA TAKE 1 TABLET EVERY DAY   ipratropium 0.03 % nasal spray Commonly known as: ATROVENT Place 1-2 sprays into both nostrils at bedtime.   lactulose 10 GM/15ML solution Commonly known as: CHRONULAC Take 30 mLs (20 g total) by mouth 3 (three) times daily. To make at least 2-3 bowel movements a day What changed:  when to take this additional instructions   omeprazole 40 MG capsule Commonly known as: PRILOSEC Take 1 capsule (40 mg total) by mouth daily.   vitamin E 180 MG (400  UNITS) capsule Take 400 Units by mouth daily.   zinc gluconate 50 MG tablet Take 50 mg by mouth daily.        Discharge Exam: There were no vitals filed for this visit. General: Fairly comfortable.  On room air.  Alert awake and oriented.  Interactive. Cardiovascular: S1-S2 normal.   Respiratory: Bilateral clear.  No added sounds. Gastrointestinal: Soft.  Nontender.  Bowel sound present.  Obese and pendulous, without any fluid thrill. Ext: No swelling or edema.  No cyanosis. Neuro: Alert awake oriented.  No tremors.  No asterixis.   Condition at discharge: good  The results of significant diagnostics from this hospitalization (including imaging, microbiology, ancillary and laboratory) are listed below for reference.   Imaging Studies: CT ABDOMEN PELVIS W CONTRAST Result Date: 09/20/2023 CLINICAL DATA:  Acute abdominal pain, history of prior TIPS EXAM: CT ABDOMEN AND PELVIS WITH CONTRAST TECHNIQUE: Multidetector CT imaging of the abdomen and pelvis was performed using the standard protocol following bolus administration of intravenous contrast. RADIATION DOSE REDUCTION: This exam was performed according to the departmental dose-optimization program which includes automated exposure control, adjustment of the mA and/or kV according to patient size and/or use of iterative reconstruction technique. CONTRAST:  75mL OMNIPAQUE IOHEXOL 350 MG/ML SOLN COMPARISON:  07/12/2023 FINDINGS: Lower chest: No acute abnormality. Hepatobiliary: Cirrhotic change of the liver is noted. Tips is seen in place. Gallbladder shows multiple gallstones without evidence of complicating factors. Pancreas: Unremarkable. No pancreatic ductal dilatation or surrounding inflammatory changes. Spleen: Normal in size without focal abnormality. 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 Left adrenal gland is less well visualized due to interval embolus therapy. Kidneys  demonstrate a normal enhancement pattern. No obstructive changes are seen. The bladder is decompressed. Stomach/Bowel: No obstructive or inflammatory changes of the colon are seen. Appendix has been surgically removed. Small bowel and stomach are within normal limits. Vascular/Lymphatic: Atherosclerotic calcifications of the aorta are noted. No aneurysmal dilatation is seen. No significant lymphadenopathy is noted. Changes of embolization are seen involving left gastric vein as well as a gastrorenal shunt. Reproductive: Prostate is unremarkable. Other: No abdominal wall hernia or abnormality. No abdominopelvic ascites. Musculoskeletal: No acute or significant osseous findings. IMPRESSION: Changes consistent with the known history of recent TIPS with embolotherapy Cirrhosis of the liver. Cholelithiasis without complicating factors. Chronic changes as described above.  No acute abnormality noted. Electronically Signed   By: Alcide Clever M.D.   On: 09/20/2023 02:07   CT Head Wo Contrast Result Date: 09/19/2023 CLINICAL DATA:  Trauma EXAM: CT HEAD WITHOUT CONTRAST TECHNIQUE: Contiguous axial images were obtained from the base of the skull through the vertex without intravenous contrast. RADIATION DOSE REDUCTION: This exam was performed according to the departmental dose-optimization program which includes automated exposure control, adjustment of the mA and/or kV according to patient size and/or use of iterative reconstruction technique.  COMPARISON:  None Available. FINDINGS: Brain: No evidence of acute infarction, hemorrhage, hydrocephalus, extra-axial collection or mass lesion/mass effect. There is mild patchy periventricular and deep white matter hypodensity, likely chronic small vessel ischemic change. Vascular: Atherosclerotic calcifications are present within the cavernous internal carotid arteries. Skull: Normal. Negative for fracture or focal lesion. Sinuses/Orbits: No acute finding. Other: None. IMPRESSION:  1. No acute intracranial process. 2. Mild chronic small vessel ischemic change. Electronically Signed   By: Darliss Cheney M.D.   On: 09/19/2023 23:15   DG Chest 2 View Result Date: 09/19/2023 CLINICAL DATA:  Altered mental status, dyspnea. EXAM: CHEST - 2 VIEW COMPARISON:  September 12, 2023. FINDINGS: The heart size and mediastinal contours are within normal limits. Both lungs are clear. The visualized skeletal structures are unremarkable. IMPRESSION: No active cardiopulmonary disease. Electronically Signed   By: Lupita Raider M.D.   On: 09/19/2023 18:14   DG Chest Port 1 View Result Date: 09/12/2023 CLINICAL DATA:  Difficulty breathing.  Sharp chest pain. EXAM: PORTABLE CHEST 1 VIEW COMPARISON:  05/02/2011. FINDINGS: Bilateral lung fields are clear. Bilateral costophrenic angles are clear. Note is made of elevated right hemidiaphragm. Normal cardio-mediastinal silhouette. No acute osseous abnormalities. The soft tissues are within normal limits. IMPRESSION: No active disease. Electronically Signed   By: Jules Schick M.D.   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 No prior episodes of hematemesis or significant gastrointestinal hemorrhage. 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 EXAM: 1. Ultrasound-guided access of the right internal jugular vein 2. Ultrasound-guided access of the right common femoral vein 3. Hepatic venogram 4. Intravascular ultrasound 5. Catheterization of the portal vein 6. Portal venous and central manometry 7. Portal venogram 8. Creation of a transhepatic portal vein to hepatic vein shunt 9. Selective catheterization and venography of the left gastric vein. 10. Coil  embolization of the left gastric vein. 11. Selective catheterization and venography of the short gastric vein. 12. Coil embolization of the short gastric vein. 13. Selective catheterization and venography of left renal vein and gastrorenal shunt. 14. Coil assisted retrograde transvenous obliteration of gastrorenal shunt and varices. MEDICATIONS: As antibiotic prophylaxis, Rocephin 2 gm IV was ordered pre-procedure and administered intravenously within one hour of incision. ANESTHESIA/SEDATION: General - as administered by the Anesthesia department CONTRAST:  100 mL Omnipaque 300, intravenous FLUOROSCOPY TIME:  One thousand three hundred seven mGy COMPLICATIONS: None immediate. PROCEDURE: Informed written consent was obtained from the patient after a thorough discussion of the procedural risks, benefits and alternatives. All questions were addressed. Maximal Sterile Barrier Technique was utilized including caps, mask, sterile gowns, sterile gloves, sterile drape, hand hygiene and skin antiseptic. A timeout was performed prior to the initiation of the procedure. A preliminary ultrasound of the right groin was performed and demonstrates a patent right common femoral vein. A permanent ultrasound image was recorded. Using a combination of fluoroscopy and ultrasound, an access site was determined. A small dermatotomy was made at the planned puncture site. Using ultrasound guidance, access into the right common femoral vein was obtained with visualization of needle entry into the vessel using a standard micropuncture technique. A wire was advanced into the IVC insert all fascial dilation performed. An 8 [place], 38  cm vascular sheath was placed into the external iliac vein. Through this access site, an 44 [place] ICE catheter was advanced with ease under fluoroscopic guidance to the level of the intrahepatic inferior vena cava. A preliminary ultrasound of the right neck was performed and demonstrates a patent internal  jugular vein. A permanent ultrasound image was recorded. Using a combination of fluoroscopy and ultrasound, an access site was determined. A small dermatotomy was made at the planned puncture site. Using ultrasound guidance, access into the right internal jugular vein was obtained with visualization of needle entry into the vessel using a standard micropuncture technique. A wire was advanced into the IVC and serial fascial dilation performed. A 10 French tips sheath was placed into the internal jugular vein and advanced to the IVC. The jugular sheath was retracted into the right atrium and manometry was performed. A 5 French angled tip catheter was then directed into the middle hepatic vein. Hepatic venogram was performed. These images demonstrated patent hepatic vein with no stenosis. The catheter was advanced to a wedge portion of the a patent vein over which the 10 French sheath was advanced into the middle hepatic vein. Using ICE ultrasound visualization of the catheter within middle hepatic vein as well as the portal anatomy was defined. A planned exit site from the hepatic vein and puncture site from the portal vein was placed into a single sonographic plane. Under direct ultrasound visualization, the ScorpionX needle was advanced into the central left portal vein. Hand injection of contrast confirmed position within the portal system. A Glidewire Advantage was then advanced into the splenic vein. A 5 French marking pigtail catheter was then advanced over the wire into the main portal vein and wire removed. Portal venogram was performed which demonstrated a patent portal vein. Portal manometry was then performed. The tract was then dilated to 8 mm with an 8 mm x 8 cm Athletis balloon. A 8-10 mm by 7 + 2 cm of Viatorr endograft was placed. No postop limits balloon molding was performed after placement of the shunt, right atrial and portal pressures were repeated. Completion portal venogram demonstrates a patent  TIPS endograft. Pre-TIPS Mean Pressures (mmHg): Right atrium: 25 Portal vein: 35 Portosystemic gradient: 10 Post-TIPS Mean Pressures (mmHg): Right atrium:28 Portal vein: 30 Portosystemic gradient: 2 The left gastric vein was then selected within the MPA catheter and Glidewire Advantage. Left gastric venogram demonstrated significant hepatic fecal flow into the network of gastric and a few small esophageal varices. Coil embolization was then performed with as assortment of 0.035" detachable Azur coils. The splenic vein was then selected splenic venogram was performed. Short gastric vein was identified with hepatofugal flow into the large gastric varix. The short gastric vein was then selected and short gastric venogram was performed which demonstrated large network of gastric varices draining via the gastro renal shunt into the left renal vein. Coil embolization was then performed with an assortment of 0.035" detachable Azur coils. The catheter and sheath within retracted from within the tips endograft into the right atrium. A guidewire was advanced into the inferior vena cava followed by advancement of the MPA catheter and 10 French sheath. The left renal vein was selected. Left renal venogram demonstrated patency and normal caliber. Using a coaxial 7 [place] Tourguide sheath, the gastro renal shunt was then cannulated. Venogram demonstrated persistent hepatofugal flow from the gastro renal shunt to the left renal vein. Therefore, occlusion balloon was inserted and inflated to occlude the central aspect of the  gastro renal shunt. Contrast was injected which demonstrated contrast trapped solely within the gastric shunts and no evidence of phrenic or esophageal involvement. The balloon was released and the contrast flowed into the left renal vein demonstrating complete evacuation. The balloon was then reinflated and the gastric varix was obliterated with a 3:2:1 air:3% sotradecol:lipiodol solution. This was followed by  placement of an assortment of 0.035 "detachable Azur coils. The balloon was slowly deflated in there is no evidence of sclerosis aunt migration. The balloon catheter was retracted and completion venogram demonstrated complete embolization of the gastro renal shunt with preserved patency of the left renal vein. The catheters and sheath were removed and manual compression was applied to the right internal jugular and right common femoral venous access sites until hemostasis was achieved. The patient was transferred to the PACU in stable condition. IMPRESSION: 1. Successful transjugular portosystemic shunt creation. 2. Portosystemic gradient of 10 mm Hg (absolute portal venous pressure 35 mm Hg) before shunt placement and 2 mm Hg (absolute portal venous pressure 30 mm Hg) after shunt placement. These measurements are likely erroneous given complex native portosystemic shunting. 3. Technically successful coil embolization of the left and short gastric veins. 4. Technically successful coil assisted retrograde transvenous obliteration of large gastric varix. Marliss Coots, MD [facility] Electronically Signed   By: Marliss Coots M.D.   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 No prior episodes of hematemesis or significant gastrointestinal hemorrhage. 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 EXAM: 1. Ultrasound-guided access of the right internal jugular vein 2. Ultrasound-guided access of the right common femoral vein 3. Hepatic venogram 4. Intravascular ultrasound 5. Catheterization of the portal vein 6. Portal venous and central  manometry 7. Portal venogram 8. Creation of a transhepatic portal vein to hepatic vein shunt 9. Selective catheterization and venography of the left gastric vein. 10. Coil embolization of the left gastric vein. 11. Selective catheterization and venography of the short gastric vein. 12. Coil embolization of the short gastric vein. 13. Selective catheterization and venography of left renal vein and gastrorenal shunt. 14. Coil assisted retrograde transvenous obliteration of gastrorenal shunt and varices. MEDICATIONS: As antibiotic prophylaxis, Rocephin 2 gm IV was ordered pre-procedure and administered intravenously within one hour of incision. ANESTHESIA/SEDATION: General - as administered by the Anesthesia department CONTRAST:  100 mL Omnipaque 300, intravenous FLUOROSCOPY TIME:  One thousand three hundred seven mGy COMPLICATIONS: None immediate. PROCEDURE: Informed written consent was obtained from the patient after a thorough discussion of the procedural risks, benefits and alternatives. All questions were addressed. Maximal Sterile Barrier Technique was utilized including caps, mask, sterile gowns, sterile gloves, sterile drape, hand hygiene and skin antiseptic. A timeout was performed prior to the initiation of the procedure. A preliminary ultrasound of the right groin was performed and demonstrates a patent right common femoral vein. A permanent ultrasound image was recorded. Using a combination of fluoroscopy and ultrasound, an access site was determined. A small dermatotomy was made at the planned puncture site. Using ultrasound guidance, access into the right common femoral vein was obtained with visualization of needle entry into the vessel using a standard micropuncture technique. A wire was  advanced into the IVC insert all fascial dilation performed. An 8 [place], 11 cm vascular sheath was placed into the external iliac vein. Through this access site, an 39 [place] ICE catheter was advanced with ease  under fluoroscopic guidance to the level of the intrahepatic inferior vena cava. A preliminary ultrasound of the right neck was performed and demonstrates a patent internal jugular vein. A permanent ultrasound image was recorded. Using a combination of fluoroscopy and ultrasound, an access site was determined. A small dermatotomy was made at the planned puncture site. Using ultrasound guidance, access into the right internal jugular vein was obtained with visualization of needle entry into the vessel using a standard micropuncture technique. A wire was advanced into the IVC and serial fascial dilation performed. A 10 French tips sheath was placed into the internal jugular vein and advanced to the IVC. The jugular sheath was retracted into the right atrium and manometry was performed. A 5 French angled tip catheter was then directed into the middle hepatic vein. Hepatic venogram was performed. These images demonstrated patent hepatic vein with no stenosis. The catheter was advanced to a wedge portion of the a patent vein over which the 10 French sheath was advanced into the middle hepatic vein. Using ICE ultrasound visualization of the catheter within middle hepatic vein as well as the portal anatomy was defined. A planned exit site from the hepatic vein and puncture site from the portal vein was placed into a single sonographic plane. Under direct ultrasound visualization, the ScorpionX needle was advanced into the central left portal vein. Hand injection of contrast confirmed position within the portal system. A Glidewire Advantage was then advanced into the splenic vein. A 5 French marking pigtail catheter was then advanced over the wire into the main portal vein and wire removed. Portal venogram was performed which demonstrated a patent portal vein. Portal manometry was then performed. The tract was then dilated to 8 mm with an 8 mm x 8 cm Athletis balloon. A 8-10 mm by 7 + 2 cm of Viatorr endograft was placed.  No postop limits balloon molding was performed after placement of the shunt, right atrial and portal pressures were repeated. Completion portal venogram demonstrates a patent TIPS endograft. Pre-TIPS Mean Pressures (mmHg): Right atrium: 25 Portal vein: 35 Portosystemic gradient: 10 Post-TIPS Mean Pressures (mmHg): Right atrium:28 Portal vein: 30 Portosystemic gradient: 2 The left gastric vein was then selected within the MPA catheter and Glidewire Advantage. Left gastric venogram demonstrated significant hepatic fecal flow into the network of gastric and a few small esophageal varices. Coil embolization was then performed with as assortment of 0.035" detachable Azur coils. The splenic vein was then selected splenic venogram was performed. Short gastric vein was identified with hepatofugal flow into the large gastric varix. The short gastric vein was then selected and short gastric venogram was performed which demonstrated large network of gastric varices draining via the gastro renal shunt into the left renal vein. Coil embolization was then performed with an assortment of 0.035" detachable Azur coils. The catheter and sheath within retracted from within the tips endograft into the right atrium. A guidewire was advanced into the inferior vena cava followed by advancement of the MPA catheter and 10 French sheath. The left renal vein was selected. Left renal venogram demonstrated patency and normal caliber. Using a coaxial 7 [place] Tourguide sheath, the gastro renal shunt was then cannulated. Venogram demonstrated persistent hepatofugal flow from the gastro renal shunt to the left renal vein. Therefore,  occlusion balloon was inserted and inflated to occlude the central aspect of the gastro renal shunt. Contrast was injected which demonstrated contrast trapped solely within the gastric shunts and no evidence of phrenic or esophageal involvement. The balloon was released and the contrast flowed into the left renal vein  demonstrating complete evacuation. The balloon was then reinflated and the gastric varix was obliterated with a 3:2:1 air:3% sotradecol:lipiodol solution. This was followed by placement of an assortment of 0.035 "detachable Azur coils. The balloon was slowly deflated in there is no evidence of sclerosis aunt migration. The balloon catheter was retracted and completion venogram demonstrated complete embolization of the gastro renal shunt with preserved patency of the left renal vein. The catheters and sheath were removed and manual compression was applied to the right internal jugular and right common femoral venous access sites until hemostasis was achieved. The patient was transferred to the PACU in stable condition. IMPRESSION: 1. Successful transjugular portosystemic shunt creation. 2. Portosystemic gradient of 10 mm Hg (absolute portal venous pressure 35 mm Hg) before shunt placement and 2 mm Hg (absolute portal venous pressure 30 mm Hg) after shunt placement. These measurements are likely erroneous given complex native portosystemic shunting. 3. Technically successful coil embolization of the left and short gastric veins. 4. Technically successful coil assisted retrograde transvenous obliteration of large gastric varix. Marliss Coots, MD [facility] Radiology Electronically Signed   By: Marliss Coots M.D.   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 No prior episodes of hematemesis or significant gastrointestinal hemorrhage. 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 EXAM: 1. Ultrasound-guided access of the right internal jugular vein  2. Ultrasound-guided access of the right common femoral vein 3. Hepatic venogram 4. Intravascular ultrasound 5. Catheterization of the portal vein 6. Portal venous and central manometry 7. Portal venogram 8. Creation of a transhepatic portal vein to hepatic vein shunt 9. Selective catheterization and venography of the left gastric vein. 10. Coil embolization of the left gastric vein. 11. Selective catheterization and venography of the short gastric vein. 12. Coil embolization of the short gastric vein. 13. Selective catheterization and venography of left renal vein and gastrorenal shunt. 14. Coil assisted retrograde transvenous obliteration of gastrorenal shunt and varices. MEDICATIONS: As antibiotic prophylaxis, Rocephin 2 gm IV was ordered pre-procedure and administered intravenously within one hour of incision. ANESTHESIA/SEDATION: General - as administered by the Anesthesia department CONTRAST:  100 mL Omnipaque 300, intravenous FLUOROSCOPY TIME:  One thousand three hundred seven mGy COMPLICATIONS: None immediate. PROCEDURE: Informed written consent was obtained from the patient after a thorough discussion of the procedural risks, benefits and alternatives. All questions were addressed. Maximal Sterile Barrier Technique was utilized including caps, mask, sterile gowns, sterile gloves, sterile drape, hand hygiene and skin antiseptic. A timeout was performed prior to the initiation of the procedure. A preliminary ultrasound of the right groin was performed and demonstrates a patent right common femoral vein. A permanent ultrasound image was recorded. Using a combination of fluoroscopy and ultrasound, an access site was determined. A small dermatotomy was made at the planned puncture site. Using ultrasound guidance, access into the right common femoral vein was obtained with visualization of  needle entry into the vessel using a standard micropuncture technique. A wire was advanced into the IVC insert all fascial  dilation performed. An 8 [place], 11 cm vascular sheath was placed into the external iliac vein. Through this access site, an 17 [place] ICE catheter was advanced with ease under fluoroscopic guidance to the level of the intrahepatic inferior vena cava. A preliminary ultrasound of the right neck was performed and demonstrates a patent internal jugular vein. A permanent ultrasound image was recorded. Using a combination of fluoroscopy and ultrasound, an access site was determined. A small dermatotomy was made at the planned puncture site. Using ultrasound guidance, access into the right internal jugular vein was obtained with visualization of needle entry into the vessel using a standard micropuncture technique. A wire was advanced into the IVC and serial fascial dilation performed. A 10 French tips sheath was placed into the internal jugular vein and advanced to the IVC. The jugular sheath was retracted into the right atrium and manometry was performed. A 5 French angled tip catheter was then directed into the middle hepatic vein. Hepatic venogram was performed. These images demonstrated patent hepatic vein with no stenosis. The catheter was advanced to a wedge portion of the a patent vein over which the 10 French sheath was advanced into the middle hepatic vein. Using ICE ultrasound visualization of the catheter within middle hepatic vein as well as the portal anatomy was defined. A planned exit site from the hepatic vein and puncture site from the portal vein was placed into a single sonographic plane. Under direct ultrasound visualization, the ScorpionX needle was advanced into the central left portal vein. Hand injection of contrast confirmed position within the portal system. A Glidewire Advantage was then advanced into the splenic vein. A 5 French marking pigtail catheter was then advanced over the wire into the main portal vein and wire removed. Portal venogram was performed which demonstrated a patent  portal vein. Portal manometry was then performed. The tract was then dilated to 8 mm with an 8 mm x 8 cm Athletis balloon. A 8-10 mm by 7 + 2 cm of Viatorr endograft was placed. No postop limits balloon molding was performed after placement of the shunt, right atrial and portal pressures were repeated. Completion portal venogram demonstrates a patent TIPS endograft. Pre-TIPS Mean Pressures (mmHg): Right atrium: 25 Portal vein: 35 Portosystemic gradient: 10 Post-TIPS Mean Pressures (mmHg): Right atrium:28 Portal vein: 30 Portosystemic gradient: 2 The left gastric vein was then selected within the MPA catheter and Glidewire Advantage. Left gastric venogram demonstrated significant hepatic fecal flow into the network of gastric and a few small esophageal varices. Coil embolization was then performed with as assortment of 0.035" detachable Azur coils. The splenic vein was then selected splenic venogram was performed. Short gastric vein was identified with hepatofugal flow into the large gastric varix. The short gastric vein was then selected and short gastric venogram was performed which demonstrated large network of gastric varices draining via the gastro renal shunt into the left renal vein. Coil embolization was then performed with an assortment of 0.035" detachable Azur coils. The catheter and sheath within retracted from within the tips endograft into the right atrium. A guidewire was advanced into the inferior vena cava followed by advancement of the MPA catheter and 10 French sheath. The left renal vein was selected. Left renal venogram demonstrated patency and normal caliber. Using a coaxial 7 [place] Tourguide sheath, the gastro renal shunt was then cannulated. Venogram demonstrated persistent  hepatofugal flow from the gastro renal shunt to the left renal vein. Therefore, occlusion balloon was inserted and inflated to occlude the central aspect of the gastro renal shunt. Contrast was injected which demonstrated  contrast trapped solely within the gastric shunts and no evidence of phrenic or esophageal involvement. The balloon was released and the contrast flowed into the left renal vein demonstrating complete evacuation. The balloon was then reinflated and the gastric varix was obliterated with a 3:2:1 air:3% sotradecol:lipiodol solution. This was followed by placement of an assortment of 0.035 "detachable Azur coils. The balloon was slowly deflated in there is no evidence of sclerosis aunt migration. The balloon catheter was retracted and completion venogram demonstrated complete embolization of the gastro renal shunt with preserved patency of the left renal vein. The catheters and sheath were removed and manual compression was applied to the right internal jugular and right common femoral venous access sites until hemostasis was achieved. The patient was transferred to the PACU in stable condition. IMPRESSION: 1. Successful transjugular portosystemic shunt creation. 2. Portosystemic gradient of 10 mm Hg (absolute portal venous pressure 35 mm Hg) before shunt placement and 2 mm Hg (absolute portal venous pressure 30 mm Hg) after shunt placement. These measurements are likely erroneous given complex native portosystemic shunting. 3. Technically successful coil embolization of the left and short gastric veins. 4. Technically successful coil assisted retrograde transvenous obliteration of large gastric varix. Marliss Coots, MD [facility] Radiology Electronically Signed   By: Marliss Coots M.D.   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 No prior episodes of hematemesis or significant gastrointestinal hemorrhage. 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 EXAM: 1. Ultrasound-guided access of the right internal jugular vein 2. Ultrasound-guided access of the right common femoral vein 3. Hepatic venogram 4. Intravascular ultrasound 5. Catheterization of the portal vein 6. Portal venous and central manometry 7. Portal venogram 8. Creation of a transhepatic portal vein to hepatic vein shunt 9. Selective catheterization and venography of the left gastric vein. 10. Coil embolization of the left gastric vein. 11. Selective catheterization and venography of the short gastric vein. 12. Coil embolization of the short gastric vein. 13. Selective catheterization and venography of left renal vein and gastrorenal shunt. 14. Coil assisted retrograde transvenous obliteration of gastrorenal shunt and varices. MEDICATIONS: As antibiotic prophylaxis, Rocephin 2 gm IV was ordered pre-procedure and administered intravenously within one hour of incision. ANESTHESIA/SEDATION: General - as administered by the Anesthesia department CONTRAST:  100 mL Omnipaque 300, intravenous FLUOROSCOPY TIME:  One thousand three hundred seven mGy COMPLICATIONS: None immediate. PROCEDURE: Informed written consent was obtained from the patient after a thorough discussion of the procedural risks, benefits and alternatives. All questions were addressed. Maximal Sterile Barrier Technique was utilized including caps, mask, sterile gowns, sterile gloves, sterile drape, hand hygiene and skin antiseptic. A timeout was performed prior to the initiation of the procedure. A preliminary ultrasound of the right groin was performed and demonstrates a patent right common femoral vein. A permanent ultrasound image was recorded. Using a combination of fluoroscopy and ultrasound, an access site was determined. A small dermatotomy was made at the planned puncture site. Using ultrasound guidance,  access into  the right common femoral vein was obtained with visualization of needle entry into the vessel using a standard micropuncture technique. A wire was advanced into the IVC insert all fascial dilation performed. An 8 [place], 11 cm vascular sheath was placed into the external iliac vein. Through this access site, an 107 [place] ICE catheter was advanced with ease under fluoroscopic guidance to the level of the intrahepatic inferior vena cava. A preliminary ultrasound of the right neck was performed and demonstrates a patent internal jugular vein. A permanent ultrasound image was recorded. Using a combination of fluoroscopy and ultrasound, an access site was determined. A small dermatotomy was made at the planned puncture site. Using ultrasound guidance, access into the right internal jugular vein was obtained with visualization of needle entry into the vessel using a standard micropuncture technique. A wire was advanced into the IVC and serial fascial dilation performed. A 10 French tips sheath was placed into the internal jugular vein and advanced to the IVC. The jugular sheath was retracted into the right atrium and manometry was performed. A 5 French angled tip catheter was then directed into the middle hepatic vein. Hepatic venogram was performed. These images demonstrated patent hepatic vein with no stenosis. The catheter was advanced to a wedge portion of the a patent vein over which the 10 French sheath was advanced into the middle hepatic vein. Using ICE ultrasound visualization of the catheter within middle hepatic vein as well as the portal anatomy was defined. A planned exit site from the hepatic vein and puncture site from the portal vein was placed into a single sonographic plane. Under direct ultrasound visualization, the ScorpionX needle was advanced into the central left portal vein. Hand injection of contrast confirmed position within the portal system. A Glidewire Advantage was then advanced into  the splenic vein. A 5 French marking pigtail catheter was then advanced over the wire into the main portal vein and wire removed. Portal venogram was performed which demonstrated a patent portal vein. Portal manometry was then performed. The tract was then dilated to 8 mm with an 8 mm x 8 cm Athletis balloon. A 8-10 mm by 7 + 2 cm of Viatorr endograft was placed. No postop limits balloon molding was performed after placement of the shunt, right atrial and portal pressures were repeated. Completion portal venogram demonstrates a patent TIPS endograft. Pre-TIPS Mean Pressures (mmHg): Right atrium: 25 Portal vein: 35 Portosystemic gradient: 10 Post-TIPS Mean Pressures (mmHg): Right atrium:28 Portal vein: 30 Portosystemic gradient: 2 The left gastric vein was then selected within the MPA catheter and Glidewire Advantage. Left gastric venogram demonstrated significant hepatic fecal flow into the network of gastric and a few small esophageal varices. Coil embolization was then performed with as assortment of 0.035" detachable Azur coils. The splenic vein was then selected splenic venogram was performed. Short gastric vein was identified with hepatofugal flow into the large gastric varix. The short gastric vein was then selected and short gastric venogram was performed which demonstrated large network of gastric varices draining via the gastro renal shunt into the left renal vein. Coil embolization was then performed with an assortment of 0.035" detachable Azur coils. The catheter and sheath within retracted from within the tips endograft into the right atrium. A guidewire was advanced into the inferior vena cava followed by advancement of the MPA catheter and 10 French sheath. The left renal vein was selected. Left renal venogram demonstrated patency and normal caliber. Using a coaxial 7 [place]  Tourguide sheath, the gastro renal shunt was then cannulated. Venogram demonstrated persistent hepatofugal flow from the gastro  renal shunt to the left renal vein. Therefore, occlusion balloon was inserted and inflated to occlude the central aspect of the gastro renal shunt. Contrast was injected which demonstrated contrast trapped solely within the gastric shunts and no evidence of phrenic or esophageal involvement. The balloon was released and the contrast flowed into the left renal vein demonstrating complete evacuation. The balloon was then reinflated and the gastric varix was obliterated with a 3:2:1 air:3% sotradecol:lipiodol solution. This was followed by placement of an assortment of 0.035 "detachable Azur coils. The balloon was slowly deflated in there is no evidence of sclerosis aunt migration. The balloon catheter was retracted and completion venogram demonstrated complete embolization of the gastro renal shunt with preserved patency of the left renal vein. The catheters and sheath were removed and manual compression was applied to the right internal jugular and right common femoral venous access sites until hemostasis was achieved. The patient was transferred to the PACU in stable condition. IMPRESSION: 1. Successful transjugular portosystemic shunt creation. 2. Portosystemic gradient of 10 mm Hg (absolute portal venous pressure 35 mm Hg) before shunt placement and 2 mm Hg (absolute portal venous pressure 30 mm Hg) after shunt placement. These measurements are likely erroneous given complex native portosystemic shunting. 3. Technically successful coil embolization of the left and short gastric veins. 4. Technically successful coil assisted retrograde transvenous obliteration of large gastric varix. Marliss Coots, MD [facility] Radiology Electronically Signed   By: Marliss Coots M.D.   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 No prior episodes of hematemesis or significant gastrointestinal hemorrhage. 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 EXAM: 1. Ultrasound-guided access of the right internal jugular vein 2. Ultrasound-guided access of the right common femoral vein 3. Hepatic venogram 4. Intravascular ultrasound 5. Catheterization of the portal vein 6. Portal venous and central manometry 7. Portal venogram 8. Creation of a transhepatic portal vein to hepatic vein shunt 9. Selective catheterization and venography of the left gastric vein. 10. Coil embolization of the left gastric vein. 11. Selective catheterization and venography of the short gastric vein. 12. Coil embolization of the short gastric vein. 13. Selective catheterization and venography of left renal vein and gastrorenal shunt. 14. Coil assisted retrograde transvenous obliteration of gastrorenal shunt and varices. MEDICATIONS: As antibiotic prophylaxis, Rocephin 2 gm IV was ordered pre-procedure and administered intravenously within one hour of incision. ANESTHESIA/SEDATION: General - as administered by the Anesthesia department CONTRAST:  100 mL Omnipaque 300, intravenous FLUOROSCOPY TIME:  One thousand three hundred seven mGy COMPLICATIONS: None immediate. PROCEDURE: Informed written consent was obtained from the patient after a thorough discussion of the procedural risks, benefits and alternatives. All questions were addressed. Maximal Sterile Barrier Technique was utilized including caps, mask, sterile gowns, sterile gloves, sterile drape, hand hygiene and skin antiseptic. A timeout was performed prior to the initiation of the procedure. A preliminary ultrasound of the right groin was performed and demonstrates a patent right common femoral vein. A permanent  ultrasound image was recorded. Using a combination of fluoroscopy and ultrasound,  an access site was determined. A small dermatotomy was made at the planned puncture site. Using ultrasound guidance, access into the right common femoral vein was obtained with visualization of needle entry into the vessel using a standard micropuncture technique. A wire was advanced into the IVC insert all fascial dilation performed. An 8 [place], 11 cm vascular sheath was placed into the external iliac vein. Through this access site, an 11 [place] ICE catheter was advanced with ease under fluoroscopic guidance to the level of the intrahepatic inferior vena cava. A preliminary ultrasound of the right neck was performed and demonstrates a patent internal jugular vein. A permanent ultrasound image was recorded. Using a combination of fluoroscopy and ultrasound, an access site was determined. A small dermatotomy was made at the planned puncture site. Using ultrasound guidance, access into the right internal jugular vein was obtained with visualization of needle entry into the vessel using a standard micropuncture technique. A wire was advanced into the IVC and serial fascial dilation performed. A 10 French tips sheath was placed into the internal jugular vein and advanced to the IVC. The jugular sheath was retracted into the right atrium and manometry was performed. A 5 French angled tip catheter was then directed into the middle hepatic vein. Hepatic venogram was performed. These images demonstrated patent hepatic vein with no stenosis. The catheter was advanced to a wedge portion of the a patent vein over which the 10 French sheath was advanced into the middle hepatic vein. Using ICE ultrasound visualization of the catheter within middle hepatic vein as well as the portal anatomy was defined. A planned exit site from the hepatic vein and puncture site from the portal vein was placed into a single sonographic plane. Under direct  ultrasound visualization, the ScorpionX needle was advanced into the central left portal vein. Hand injection of contrast confirmed position within the portal system. A Glidewire Advantage was then advanced into the splenic vein. A 5 French marking pigtail catheter was then advanced over the wire into the main portal vein and wire removed. Portal venogram was performed which demonstrated a patent portal vein. Portal manometry was then performed. The tract was then dilated to 8 mm with an 8 mm x 8 cm Athletis balloon. A 8-10 mm by 7 + 2 cm of Viatorr endograft was placed. No postop limits balloon molding was performed after placement of the shunt, right atrial and portal pressures were repeated. Completion portal venogram demonstrates a patent TIPS endograft. Pre-TIPS Mean Pressures (mmHg): Right atrium: 25 Portal vein: 35 Portosystemic gradient: 10 Post-TIPS Mean Pressures (mmHg): Right atrium:28 Portal vein: 30 Portosystemic gradient: 2 The left gastric vein was then selected within the MPA catheter and Glidewire Advantage. Left gastric venogram demonstrated significant hepatic fecal flow into the network of gastric and a few small esophageal varices. Coil embolization was then performed with as assortment of 0.035" detachable Azur coils. The splenic vein was then selected splenic venogram was performed. Short gastric vein was identified with hepatofugal flow into the large gastric varix. The short gastric vein was then selected and short gastric venogram was performed which demonstrated large network of gastric varices draining via the gastro renal shunt into the left renal vein. Coil embolization was then performed with an assortment of 0.035" detachable Azur coils. The catheter and sheath within retracted from within the tips endograft into the right atrium. A guidewire was advanced into the inferior vena cava followed by advancement of the MPA catheter and 10 French sheath. The left  renal vein was selected.  Left renal venogram demonstrated patency and normal caliber. Using a coaxial 7 [place] Tourguide sheath, the gastro renal shunt was then cannulated. Venogram demonstrated persistent hepatofugal flow from the gastro renal shunt to the left renal vein. Therefore, occlusion balloon was inserted and inflated to occlude the central aspect of the gastro renal shunt. Contrast was injected which demonstrated contrast trapped solely within the gastric shunts and no evidence of phrenic or esophageal involvement. The balloon was released and the contrast flowed into the left renal vein demonstrating complete evacuation. The balloon was then reinflated and the gastric varix was obliterated with a 3:2:1 air:3% sotradecol:lipiodol solution. This was followed by placement of an assortment of 0.035 "detachable Azur coils. The balloon was slowly deflated in there is no evidence of sclerosis aunt migration. The balloon catheter was retracted and completion venogram demonstrated complete embolization of the gastro renal shunt with preserved patency of the left renal vein. The catheters and sheath were removed and manual compression was applied to the right internal jugular and right common femoral venous access sites until hemostasis was achieved. The patient was transferred to the PACU in stable condition. IMPRESSION: 1. Successful transjugular portosystemic shunt creation. 2. Portosystemic gradient of 10 mm Hg (absolute portal venous pressure 35 mm Hg) before shunt placement and 2 mm Hg (absolute portal venous pressure 30 mm Hg) after shunt placement. These measurements are likely erroneous given complex native portosystemic shunting. 3. Technically successful coil embolization of the left and short gastric veins. 4. Technically successful coil assisted retrograde transvenous obliteration of large gastric varix. Marliss Coots, MD [facility] Radiology Electronically Signed   By: Marliss Coots M.D.   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 No prior episodes of hematemesis or significant gastrointestinal hemorrhage. 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 EXAM: 1. Ultrasound-guided access of the right internal jugular vein 2. Ultrasound-guided access of the right common femoral vein 3. Hepatic venogram 4. Intravascular ultrasound 5. Catheterization of the portal vein 6. Portal venous and central manometry 7. Portal venogram 8. Creation of a transhepatic portal vein to hepatic vein shunt 9. Selective catheterization and venography of the left gastric vein. 10. Coil embolization of the left gastric vein. 11. Selective catheterization and venography of the short gastric vein. 12. Coil embolization of the short gastric vein. 13. Selective catheterization and venography of left renal vein and gastrorenal shunt. 14. Coil assisted retrograde transvenous obliteration of gastrorenal shunt and varices. MEDICATIONS: As antibiotic prophylaxis, Rocephin 2 gm IV was ordered pre-procedure and administered intravenously within one hour of incision. ANESTHESIA/SEDATION: General - as administered by the Anesthesia department CONTRAST:  100 mL Omnipaque 300, intravenous FLUOROSCOPY TIME:  One thousand three hundred seven mGy COMPLICATIONS: None immediate. PROCEDURE: Informed written consent was obtained from the patient after a thorough discussion of the procedural risks, benefits and alternatives. All questions were addressed. Maximal Sterile Barrier Technique was utilized including caps, mask, sterile gowns, sterile gloves, sterile drape, hand hygiene  and skin antiseptic. A timeout was performed prior to the initiation of the procedure. A preliminary ultrasound of the right groin was performed and demonstrates  a patent right common femoral vein. A permanent ultrasound image was recorded. Using a combination of fluoroscopy and ultrasound, an access site was determined. A small dermatotomy was made at the planned puncture site. Using ultrasound guidance, access into the right common femoral vein was obtained with visualization of needle entry into the vessel using a standard micropuncture technique. A wire was advanced into the IVC insert all fascial dilation performed. An 8 [place], 11 cm vascular sheath was placed into the external iliac vein. Through this access site, an 31 [place] ICE catheter was advanced with ease under fluoroscopic guidance to the level of the intrahepatic inferior vena cava. A preliminary ultrasound of the right neck was performed and demonstrates a patent internal jugular vein. A permanent ultrasound image was recorded. Using a combination of fluoroscopy and ultrasound, an access site was determined. A small dermatotomy was made at the planned puncture site. Using ultrasound guidance, access into the right internal jugular vein was obtained with visualization of needle entry into the vessel using a standard micropuncture technique. A wire was advanced into the IVC and serial fascial dilation performed. A 10 French tips sheath was placed into the internal jugular vein and advanced to the IVC. The jugular sheath was retracted into the right atrium and manometry was performed. A 5 French angled tip catheter was then directed into the middle hepatic vein. Hepatic venogram was performed. These images demonstrated patent hepatic vein with no stenosis. The catheter was advanced to a wedge portion of the a patent vein over which the 10 French sheath was advanced into the middle hepatic vein. Using ICE ultrasound visualization of the catheter  within middle hepatic vein as well as the portal anatomy was defined. A planned exit site from the hepatic vein and puncture site from the portal vein was placed into a single sonographic plane. Under direct ultrasound visualization, the ScorpionX needle was advanced into the central left portal vein. Hand injection of contrast confirmed position within the portal system. A Glidewire Advantage was then advanced into the splenic vein. A 5 French marking pigtail catheter was then advanced over the wire into the main portal vein and wire removed. Portal venogram was performed which demonstrated a patent portal vein. Portal manometry was then performed. The tract was then dilated to 8 mm with an 8 mm x 8 cm Athletis balloon. A 8-10 mm by 7 + 2 cm of Viatorr endograft was placed. No postop limits balloon molding was performed after placement of the shunt, right atrial and portal pressures were repeated. Completion portal venogram demonstrates a patent TIPS endograft. Pre-TIPS Mean Pressures (mmHg): Right atrium: 25 Portal vein: 35 Portosystemic gradient: 10 Post-TIPS Mean Pressures (mmHg): Right atrium:28 Portal vein: 30 Portosystemic gradient: 2 The left gastric vein was then selected within the MPA catheter and Glidewire Advantage. Left gastric venogram demonstrated significant hepatic fecal flow into the network of gastric and a few small esophageal varices. Coil embolization was then performed with as assortment of 0.035" detachable Azur coils. The splenic vein was then selected splenic venogram was performed. Short gastric vein was identified with hepatofugal flow into the large gastric varix. The short gastric vein was then selected and short gastric venogram was performed which demonstrated large network of gastric varices draining via the gastro renal shunt into the left renal vein. Coil embolization was then performed with an assortment of 0.035" detachable Azur coils. The catheter and sheath within retracted  from within the tips endograft into the right atrium. A guidewire  was advanced into the inferior vena cava followed by advancement of the MPA catheter and 10 French sheath. The left renal vein was selected. Left renal venogram demonstrated patency and normal caliber. Using a coaxial 7 [place] Tourguide sheath, the gastro renal shunt was then cannulated. Venogram demonstrated persistent hepatofugal flow from the gastro renal shunt to the left renal vein. Therefore, occlusion balloon was inserted and inflated to occlude the central aspect of the gastro renal shunt. Contrast was injected which demonstrated contrast trapped solely within the gastric shunts and no evidence of phrenic or esophageal involvement. The balloon was released and the contrast flowed into the left renal vein demonstrating complete evacuation. The balloon was then reinflated and the gastric varix was obliterated with a 3:2:1 air:3% sotradecol:lipiodol solution. This was followed by placement of an assortment of 0.035 "detachable Azur coils. The balloon was slowly deflated in there is no evidence of sclerosis aunt migration. The balloon catheter was retracted and completion venogram demonstrated complete embolization of the gastro renal shunt with preserved patency of the left renal vein. The catheters and sheath were removed and manual compression was applied to the right internal jugular and right common femoral venous access sites until hemostasis was achieved. The patient was transferred to the PACU in stable condition. IMPRESSION: 1. Successful transjugular portosystemic shunt creation. 2. Portosystemic gradient of 10 mm Hg (absolute portal venous pressure 35 mm Hg) before shunt placement and 2 mm Hg (absolute portal venous pressure 30 mm Hg) after shunt placement. These measurements are likely erroneous given complex native portosystemic shunting. 3. Technically successful coil embolization of the left and short gastric veins. 4. Technically  successful coil assisted retrograde transvenous obliteration of large gastric varix. Marliss Coots, MD [facility] Radiology Electronically Signed   By: Marliss Coots M.D.   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 No prior episodes of hematemesis or significant gastrointestinal hemorrhage. 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 EXAM: 1. Ultrasound-guided access of the right internal jugular vein 2. Ultrasound-guided access of the right common femoral vein 3. Hepatic venogram 4. Intravascular ultrasound 5. Catheterization of the portal vein 6. Portal venous and central manometry 7. Portal venogram 8. Creation of a transhepatic portal vein to hepatic vein shunt 9. Selective catheterization and venography of the left gastric vein. 10. Coil embolization of the left gastric vein. 11. Selective catheterization and venography of the short gastric vein. 12. Coil embolization of the short gastric vein. 13. Selective catheterization and venography of left renal vein and gastrorenal shunt. 14. Coil assisted retrograde transvenous obliteration of gastrorenal shunt and varices. MEDICATIONS: As antibiotic prophylaxis, Rocephin 2 gm IV was ordered pre-procedure and administered intravenously within one hour of incision. ANESTHESIA/SEDATION: General - as administered by the Anesthesia department CONTRAST:  100 mL Omnipaque 300, intravenous FLUOROSCOPY TIME:  One thousand three hundred seven mGy COMPLICATIONS: None immediate. PROCEDURE: Informed written consent was obtained from the patient after a thorough discussion  of the procedural risks, benefits and alternatives. All questions were addressed. Maximal Sterile Barrier Technique was utilized including caps, mask, sterile gowns, sterile gloves, sterile drape, hand hygiene and skin antiseptic. A timeout was  performed prior to the initiation of the procedure. A preliminary ultrasound of the right groin was performed and demonstrates a patent right common femoral vein. A permanent ultrasound image was recorded. Using a combination of fluoroscopy and ultrasound, an access site was determined. A small dermatotomy was made at the planned puncture site. Using ultrasound guidance, access into the right common femoral vein was obtained with visualization of needle entry into the vessel using a standard micropuncture technique. A wire was advanced into the IVC insert all fascial dilation performed. An 8 [place], 11 cm vascular sheath was placed into the external iliac vein. Through this access site, an 34 [place] ICE catheter was advanced with ease under fluoroscopic guidance to the level of the intrahepatic inferior vena cava. A preliminary ultrasound of the right neck was performed and demonstrates a patent internal jugular vein. A permanent ultrasound image was recorded. Using a combination of fluoroscopy and ultrasound, an access site was determined. A small dermatotomy was made at the planned puncture site. Using ultrasound guidance, access into the right internal jugular vein was obtained with visualization of needle entry into the vessel using a standard micropuncture technique. A wire was advanced into the IVC and serial fascial dilation performed. A 10 French tips sheath was placed into the internal jugular vein and advanced to the IVC. The jugular sheath was retracted into the right atrium and manometry was performed. A 5 French angled tip catheter was then directed into the middle hepatic vein. Hepatic venogram was performed. These images demonstrated patent hepatic vein  with no stenosis. The catheter was advanced to a wedge portion of the a patent vein over which the 10 French sheath was advanced into the middle hepatic vein. Using ICE ultrasound visualization of the catheter within middle hepatic vein as well as the portal anatomy was defined. A planned exit site from the hepatic vein and puncture site from the portal vein was placed into a single sonographic plane. Under direct ultrasound visualization, the ScorpionX needle was advanced into the central left portal vein. Hand injection of contrast confirmed position within the portal system. A Glidewire Advantage was then advanced into the splenic vein. A 5 French marking pigtail catheter was then advanced over the wire into the main portal vein and wire removed. Portal venogram was performed which demonstrated a patent portal vein. Portal manometry was then performed. The tract was then dilated to 8 mm with an 8 mm x 8 cm Athletis balloon. A 8-10 mm by 7 + 2 cm of Viatorr endograft was placed. No postop limits balloon molding was performed after placement of the shunt, right atrial and portal pressures were repeated. Completion portal venogram demonstrates a patent TIPS endograft. Pre-TIPS Mean Pressures (mmHg): Right atrium: 25 Portal vein: 35 Portosystemic gradient: 10 Post-TIPS Mean Pressures (mmHg): Right atrium:28 Portal vein: 30 Portosystemic gradient: 2 The left gastric vein was then selected within the MPA catheter and Glidewire Advantage. Left gastric venogram demonstrated significant hepatic fecal flow into the network of gastric and a few small esophageal varices. Coil embolization was then performed with as assortment of 0.035" detachable Azur coils. The splenic vein was then selected splenic venogram was performed. Short gastric vein was identified with hepatofugal flow into the large gastric varix. The short gastric vein was then selected and short gastric venogram was performed which demonstrated large network of  gastric varices draining via the gastro renal shunt into the left renal vein. Coil embolization was then performed with an assortment of 0.035" detachable  Azur coils. The catheter and sheath within retracted from within the tips endograft into the right atrium. A guidewire was advanced into the inferior vena cava followed by advancement of the MPA catheter and 10 French sheath. The left renal vein was selected. Left renal venogram demonstrated patency and normal caliber. Using a coaxial 7 [place] Tourguide sheath, the gastro renal shunt was then cannulated. Venogram demonstrated persistent hepatofugal flow from the gastro renal shunt to the left renal vein. Therefore, occlusion balloon was inserted and inflated to occlude the central aspect of the gastro renal shunt. Contrast was injected which demonstrated contrast trapped solely within the gastric shunts and no evidence of phrenic or esophageal involvement. The balloon was released and the contrast flowed into the left renal vein demonstrating complete evacuation. The balloon was then reinflated and the gastric varix was obliterated with a 3:2:1 air:3% sotradecol:lipiodol solution. This was followed by placement of an assortment of 0.035 "detachable Azur coils. The balloon was slowly deflated in there is no evidence of sclerosis aunt migration. The balloon catheter was retracted and completion venogram demonstrated complete embolization of the gastro renal shunt with preserved patency of the left renal vein. The catheters and sheath were removed and manual compression was applied to the right internal jugular and right common femoral venous access sites until hemostasis was achieved. The patient was transferred to the PACU in stable condition. IMPRESSION: 1. Successful transjugular portosystemic shunt creation. 2. Portosystemic gradient of 10 mm Hg (absolute portal venous pressure 35 mm Hg) before shunt placement and 2 mm Hg (absolute portal venous pressure 30 mm  Hg) after shunt placement. These measurements are likely erroneous given complex native portosystemic shunting. 3. Technically successful coil embolization of the left and short gastric veins. 4. Technically successful coil assisted retrograde transvenous obliteration of large gastric varix. Marliss Coots, MD [facility] Radiology Electronically Signed   By: Marliss Coots M.D.   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 No prior episodes of hematemesis or significant gastrointestinal hemorrhage. 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 EXAM: 1. Ultrasound-guided access of the right internal jugular vein 2. Ultrasound-guided access of the right common femoral vein 3. Hepatic venogram 4. Intravascular ultrasound 5. Catheterization of the portal vein 6. Portal venous and central manometry 7. Portal venogram 8. Creation of a transhepatic portal vein to hepatic vein shunt 9. Selective catheterization and venography of the left gastric vein. 10. Coil embolization of the left gastric vein. 11. Selective catheterization and venography of the short gastric vein. 12. Coil embolization of the short gastric vein. 13. Selective catheterization and venography of left renal vein and gastrorenal shunt. 14. Coil assisted retrograde transvenous obliteration of gastrorenal shunt and varices. MEDICATIONS: As antibiotic prophylaxis, Rocephin 2 gm IV was ordered pre-procedure and administered intravenously within one hour of incision. ANESTHESIA/SEDATION: General - as administered by the Anesthesia department CONTRAST:  100 mL Omnipaque 300,  intravenous FLUOROSCOPY TIME:  One thousand three hundred seven mGy COMPLICATIONS: None immediate. PROCEDURE: Informed written consent was obtained from the patient after a thorough discussion of the procedural risks, benefits and alternatives. All questions were addressed. Maximal Sterile Barrier Technique was utilized including caps, mask, sterile  gowns, sterile gloves, sterile drape, hand hygiene and skin antiseptic. A timeout was performed prior to the initiation of the procedure. A preliminary ultrasound of the right groin was performed and demonstrates a patent right common femoral vein. A permanent ultrasound image was recorded. Using a combination of fluoroscopy and ultrasound, an access site was determined. A small dermatotomy was made at the planned puncture site. Using ultrasound guidance, access into the right common femoral vein was obtained with visualization of needle entry into the vessel using a standard micropuncture technique. A wire was advanced into the IVC insert all fascial dilation performed. An 8 [place], 11 cm vascular sheath was placed into the external iliac vein. Through this access site, an 13 [place] ICE catheter was advanced with ease under fluoroscopic guidance to the level of the intrahepatic inferior vena cava. A preliminary ultrasound of the right neck was performed and demonstrates a patent internal jugular vein. A permanent ultrasound image was recorded. Using a combination of fluoroscopy and ultrasound, an access site was determined. A small dermatotomy was made at the planned puncture site. Using ultrasound guidance, access into the right internal jugular vein was obtained with visualization of needle entry into the vessel using a standard micropuncture technique. A wire was advanced into the IVC and serial fascial dilation performed. A 10 French tips sheath was placed into the internal jugular vein and advanced to the IVC. The jugular sheath was retracted into the right  atrium and manometry was performed. A 5 French angled tip catheter was then directed into the middle hepatic vein. Hepatic venogram was performed. These images demonstrated patent hepatic vein with no stenosis. The catheter was advanced to a wedge portion of the a patent vein over which the 10 French sheath was advanced into the middle hepatic vein. Using ICE ultrasound visualization of the catheter within middle hepatic vein as well as the portal anatomy was defined. A planned exit site from the hepatic vein and puncture site from the portal vein was placed into a single sonographic plane. Under direct ultrasound visualization, the ScorpionX needle was advanced into the central left portal vein. Hand injection of contrast confirmed position within the portal system. A Glidewire Advantage was then advanced into the splenic vein. A 5 French marking pigtail catheter was then advanced over the wire into the main portal vein and wire removed. Portal venogram was performed which demonstrated a patent portal vein. Portal manometry was then performed. The tract was then dilated to 8 mm with an 8 mm x 8 cm Athletis balloon. A 8-10 mm by 7 + 2 cm of Viatorr endograft was placed. No postop limits balloon molding was performed after placement of the shunt, right atrial and portal pressures were repeated. Completion portal venogram demonstrates a patent TIPS endograft. Pre-TIPS Mean Pressures (mmHg): Right atrium: 25 Portal vein: 35 Portosystemic gradient: 10 Post-TIPS Mean Pressures (mmHg): Right atrium:28 Portal vein: 30 Portosystemic gradient: 2 The left gastric vein was then selected within the MPA catheter and Glidewire Advantage. Left gastric venogram demonstrated significant hepatic fecal flow into the network of gastric and a few small esophageal varices. Coil embolization was then performed with as assortment of 0.035" detachable Azur coils. The splenic vein was then selected splenic venogram was performed. Short  gastric vein was identified with hepatofugal flow into the large gastric varix. The short gastric vein was then selected and short gastric venogram was performed which demonstrated large network of gastric varices draining via the gastro renal shunt into the left  renal vein. Coil embolization was then performed with an assortment of 0.035" detachable Azur coils. The catheter and sheath within retracted from within the tips endograft into the right atrium. A guidewire was advanced into the inferior vena cava followed by advancement of the MPA catheter and 10 French sheath. The left renal vein was selected. Left renal venogram demonstrated patency and normal caliber. Using a coaxial 7 [place] Tourguide sheath, the gastro renal shunt was then cannulated. Venogram demonstrated persistent hepatofugal flow from the gastro renal shunt to the left renal vein. Therefore, occlusion balloon was inserted and inflated to occlude the central aspect of the gastro renal shunt. Contrast was injected which demonstrated contrast trapped solely within the gastric shunts and no evidence of phrenic or esophageal involvement. The balloon was released and the contrast flowed into the left renal vein demonstrating complete evacuation. The balloon was then reinflated and the gastric varix was obliterated with a 3:2:1 air:3% sotradecol:lipiodol solution. This was followed by placement of an assortment of 0.035 "detachable Azur coils. The balloon was slowly deflated in there is no evidence of sclerosis aunt migration. The balloon catheter was retracted and completion venogram demonstrated complete embolization of the gastro renal shunt with preserved patency of the left renal vein. The catheters and sheath were removed and manual compression was applied to the right internal jugular and right common femoral venous access sites until hemostasis was achieved. The patient was transferred to the PACU in stable condition. IMPRESSION: 1. Successful  transjugular portosystemic shunt creation. 2. Portosystemic gradient of 10 mm Hg (absolute portal venous pressure 35 mm Hg) before shunt placement and 2 mm Hg (absolute portal venous pressure 30 mm Hg) after shunt placement. These measurements are likely erroneous given complex native portosystemic shunting. 3. Technically successful coil embolization of the left and short gastric veins. 4. Technically successful coil assisted retrograde transvenous obliteration of large gastric varix. Marliss Coots, MD [facility] Rad[facility] Electronically Signed   By: Marliss Coots M.D.   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 No prior episodes of hematemesis or significant gastrointestinal hemorrhage. 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 EXAM: 1. Ultrasound-guided access of the right internal jugular vein 2. Ultrasound-guided access of the right common femoral vein 3. Hepatic venogram 4. Intravascular ultrasound 5. Catheterization of the portal vein 6. Portal venous and central manometry 7. Portal venogram 8. Creation of a transhepatic portal vein to hepatic vein shunt 9. Selective catheterization and venography of the left gastric vein. 10. Coil embolization of the left gastric vein. 11. Selective catheterization and venography of the short gastric vein. 12. Coil embolization of the short gastric vein. 13. Selective catheterization and venography of left renal vein and gastrorenal shunt. 14. Coil assisted retrograde transvenous obliteration of gastrorenal shunt and varices. MEDICATIONS: As antibiotic prophylaxis, Rocephin 2 gm  IV was ordered pre-procedure and administered intravenously within one hour of incision. ANESTHESIA/SEDATION: General - as administered by the Anesthesia department CONTRAST:  100 mL Omnipaque 300, intravenous FLUOROSCOPY TIME:  One thousand three hundred seven mGy COMPLICATIONS: None immediate. PROCEDURE: Informed written consent was obtained from the patient after a thorough discussion of the procedural risks, benefits and alternatives. All  questions were addressed. Maximal Sterile Barrier Technique was utilized including caps, mask, sterile gowns, sterile gloves, sterile drape, hand hygiene and skin antiseptic. A timeout was performed prior to the initiation of the procedure. A preliminary ultrasound of the right groin was performed and demonstrates a patent right common femoral vein. A permanent ultrasound image was recorded. Using a combination of fluoroscopy and ultrasound, an access site was determined. A small dermatotomy was made at the planned puncture site. Using ultrasound guidance, access into the right common femoral vein was obtained with visualization of needle entry into the vessel using a standard micropuncture technique. A wire was advanced into the IVC insert all fascial dilation performed. An 8 [place], 11 cm vascular sheath was placed into the external iliac vein. Through this access site, an 65 [place] ICE catheter was advanced with ease under fluoroscopic guidance to the level of the intrahepatic inferior vena cava. A preliminary ultrasound of the right neck was performed and demonstrates a patent internal jugular vein. A permanent ultrasound image was recorded. Using a combination of fluoroscopy and ultrasound, an access site was determined. A small dermatotomy was made at the planned puncture site. Using ultrasound guidance, access into the right internal jugular vein was obtained with visualization of needle entry into the vessel using a standard micropuncture technique. A wire was  advanced into the IVC and serial fascial dilation performed. A 10 French tips sheath was placed into the internal jugular vein and advanced to the IVC. The jugular sheath was retracted into the right atrium and manometry was performed. A 5 French angled tip catheter was then directed into the middle hepatic vein. Hepatic venogram was performed. These images demonstrated patent hepatic vein with no stenosis. The catheter was advanced to a wedge portion of the a patent vein over which the 10 French sheath was advanced into the middle hepatic vein. Using ICE ultrasound visualization of the catheter within middle hepatic vein as well as the portal anatomy was defined. A planned exit site from the hepatic vein and puncture site from the portal vein was placed into a single sonographic plane. Under direct ultrasound visualization, the ScorpionX needle was advanced into the central left portal vein. Hand injection of contrast confirmed position within the portal system. A Glidewire Advantage was then advanced into the splenic vein. A 5 French marking pigtail catheter was then advanced over the wire into the main portal vein and wire removed. Portal venogram was performed which demonstrated a patent portal vein. Portal manometry was then performed. The tract was then dilated to 8 mm with an 8 mm x 8 cm Athletis balloon. A 8-10 mm by 7 + 2 cm of Viatorr endograft was placed. No postop limits balloon molding was performed after placement of the shunt, right atrial and portal pressures were repeated. Completion portal venogram demonstrates a patent TIPS endograft. Pre-TIPS Mean Pressures (mmHg): Right atrium: 25 Portal vein: 35 Portosystemic gradient: 10 Post-TIPS Mean Pressures (mmHg): Right atrium:28 Portal vein: 30 Portosystemic gradient: 2 The left gastric vein was then selected within the MPA catheter and Glidewire Advantage. Left gastric venogram demonstrated significant hepatic fecal flow into the network of gastric and  a few small esophageal varices. Coil embolization was then performed with as assortment of 0.035" detachable Azur coils. The splenic vein was then selected splenic venogram was performed. Short gastric vein was identified with hepatofugal flow into the large gastric varix. The short gastric vein was then selected and short gastric venogram was performed which demonstrated large  network of gastric varices draining via the gastro renal shunt into the left renal vein. Coil embolization was then performed with an assortment of 0.035" detachable Azur coils. The catheter and sheath within retracted from within the tips endograft into the right atrium. A guidewire was advanced into the inferior vena cava followed by advancement of the MPA catheter and 10 French sheath. The left renal vein was selected. Left renal venogram demonstrated patency and normal caliber. Using a coaxial 7 [place] Tourguide sheath, the gastro renal shunt was then cannulated. Venogram demonstrated persistent hepatofugal flow from the gastro renal shunt to the left renal vein. Therefore, occlusion balloon was inserted and inflated to occlude the central aspect of the gastro renal shunt. Contrast was injected which demonstrated contrast trapped solely within the gastric shunts and no evidence of phrenic or esophageal involvement. The balloon was released and the contrast flowed into the left renal vein demonstrating complete evacuation. The balloon was then reinflated and the gastric varix was obliterated with a 3:2:1 air:3% sotradecol:lipiodol solution. This was followed by placement of an assortment of 0.035 "detachable Azur coils. The balloon was slowly deflated in there is no evidence of sclerosis aunt migration. The balloon catheter was retracted and completion venogram demonstrated complete embolization of the gastro renal shunt with preserved patency of the left renal vein. The catheters and sheath were removed and manual compression was applied  to the right internal jugular and right common femoral venous access sites until hemostasis was achieved. The patient was transferred to the PACU in stable condition. IMPRESSION: 1. Successful transjugular portosystemic shunt creation. 2. Portosystemic gradient of 10 mm Hg (absolute portal venous pressure 35 mm Hg) before shunt placement and 2 mm Hg (absolute portal venous pressure 30 mm Hg) after shunt placement. These measurements are likely erroneous given complex native portosystemic shunting. 3. Technically successful coil embolization of the left and short gastric veins. 4. Technically successful coil assisted retrograde transvenous obliteration of large gastric varix. Marliss Coots, MD [facility] Radiology Electronically Signed   By: Marliss Coots M.D.   On: 08/28/2023 21:48    Microbiology: Results for orders placed or performed during the hospital encounter of 09/12/23  Resp panel by RT-PCR (RSV, Flu A&B, Covid) Anterior Nasal Swab     Status: None   Collection Time: 09/12/23 12:45 PM   Specimen: Anterior Nasal Swab  Result Value Ref Range Status   SARS Coronavirus 2 by RT PCR NEGATIVE NEGATIVE Final    Comment: (NOTE) SARS-CoV-2 target nucleic acids are NOT DETECTED.  The SARS-CoV-2 RNA is generally detectable in upper respiratory specimens during the acute phase of infection. The lowest concentration of SARS-CoV-2 viral copies this assay can detect is 138 copies/mL. A negative result does not preclude SARS-Cov-2 infection and should not be used as the sole basis for treatment or other patient management decisions. A negative result may occur with  improper specimen collection/handling, submission of specimen other than nasopharyngeal swab, presence of viral mutation(s) within the areas targeted by this assay, and inadequate number of viral copies(<138 copies/mL). A negative result must be combined with clinical observations, patient  history, and epidemiological information. The expected result is Negative.  Fact Sheet for Patients:  BloggerCourse.com  Fact Sheet for Healthcare Providers:  SeriousBroker.it  This test is no t yet approved or cleared by the [place] FDA and  has been authorized for detection and/or diagnosis of SARS-CoV-2 by FDA under an Emergency Use Authorization (EUA). This EUA will remain  in effect (meaning this test can be used) for the duration of the COVID-19 declaration under Section 564(b)(1) of the Act, 21 U.S.C.section 360bbb-3(b)(1), unless the authorization is terminated  or revoked sooner.       Influenza A by PCR NEGATIVE NEGATIVE Final   Influenza B by PCR NEGATIVE NEGATIVE Final    Comment: (NOTE) The Xpert Xpress SARS-CoV-2/FLU/RSV plus assay is intended as an aid in the diagnosis of influenza from Nasopharyngeal swab specimens and should not be used as a sole basis for treatment. Nasal washings and aspirates are unacceptable for Xpert Xpress SARS-CoV-2/FLU/RSV testing.  Fact Sheet for Patients: BloggerCourse.com  Fact Sheet for Healthcare Providers: SeriousBroker.it  This test is not yet approved or cleared by the [place] FDA and has been authorized for detection and/or diagnosis of SARS-CoV-2 by FDA under an Emergency Use Authorization (EUA). This EUA will remain in effect (meaning this test can be used) for the duration of the COVID-19 declaration under Section 564(b)(1) of the Act, 21 U.S.C. section 360bbb-3(b)(1), unless the authorization is terminated or revoked.     Resp Syncytial Virus by PCR NEGATIVE NEGATIVE Final    Comment: (NOTE) Fact Sheet for Patients: BloggerCourse.com  Fact Sheet for Healthcare Providers: SeriousBroker.it  This test is not yet approved or cleared by the [place] FDA  and has been authorized for detection and/or diagnosis of SARS-CoV-2 by FDA under an Emergency Use Authorization (EUA). This EUA will remain in effect (meaning this test can be used) for the duration of the COVID-19 declaration under Section 564(b)(1) of the Act, 21 U.S.C. section 360bbb-3(b)(1), unless the authorization is terminated or revoked.  Performed at [facility], [address] [place], [place] [address]     Labs: CBC: Recent Labs  Lab 09/19/23 1617 09/20/23 0121 09/20/23 0242  WBC 6.2 6.7 6.2  NEUTROABS 3.3  --  3.0  HGB 12.5* 11.9* 11.7*  HCT 36.1* 34.0* 34.1*  MCV 88.5 89.2 89.0  PLT 133* 100* 100*   Basic Metabolic Panel: Recent Labs  Lab 09/19/23 1418 09/20/23 0121  NA 142 140  K 4.1 3.4*  CL 110 109  CO2 22 21*  GLUCOSE 97 94  BUN 9 9  CREATININE 1.03 0.95  CALCIUM 9.5 9.1   Liver Function Tests: Recent Labs  Lab 09/19/23 1418 09/20/23 0121  AST 41 35  ALT 23 20  ALKPHOS 65 58  BILITOT 1.8* 2.0*  PROT 6.9 6.3*  ALBUMIN 3.6 3.1*   CBG: Recent Labs  Lab 09/20/23 0123  GLUCAP 93    Discharge time spent: more than 30 minutes.  Signed: Dorcas Carrow, MD [facility] 09/20/2023

## 2023-09-21 NOTE — Progress Notes (Signed)
 Referring Physician(s): [name]   Chief Complaint: The patient is seen in follow up today s/p TIPS 08/28/23  History of present illness: HPI from last clinic visit 08/10/23 Ricky Rios is a 73 y.o. male with a medical history significant for DM, COPD, HTN, sleep apnea, colonic polyps and NASH cirrhosis. The patient is also a smoker and had a lung cancer screening CT in July 2024. This showed cirrhosis with splenomegaly and vascular collateralization in the left abdomen. His GI doctor brought him in for an EDG 06/01/23 which revealed Grade II esophageal and gastric varices with no stigmata of recent bleeding. During a clinic visit the patient's wife reported that the patient was having trouble with his memory and he was started on lactulose.   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 He presents today alongside his wife. He denies any prior episodes of hematemesis. He does have occasional melena, however confounded by known hemorrhoids.   Additional imaging was needed to determine his candidacy for intervention and he underwent CT imaging and echocardiogram. He has been seen by Cardiology and diagnosed with mild aortic stenosis. He has good ejection fraction. CTA demonstrates a large gastric varix with gastrorenal shunt.   We discussed in depth the rationale of transvenous obliteration of gastric varices in addition to TIPS creation. We discussed that obliteration of gastric varices would significantly decrease chances of fatal gastrointestinal hemorrhage, however would not improve his underlying portal hypertension, which would be corrected with TIPS. We discussed that TIPS introduces another portosystemic shunt, increasing his risk of worsening hepatic encephalopathy, but with embolization of the gastrorenal shunt we expect him to have no greater encephalopathy than he does  now, which is minimal. He and his wife were in favor of the more comprehensive approach of combined TIPS + retrograde transvenous obliteration of gastric varix. We discussed additional risks and benefits associated with this procedure and he was in agreement to proceed.   On 08/28/23 he underwent TIPS creation with embolization of the left gastric and short gastric veins as well as coil-assisted retrograde transvenous obliteration of gastrorenal shunt and varices. He tolerated the procedure well and was discharged home the next day. He was instructed on post-procedure care including the importance of taking his lactulose as directed and he was followed with regular phone calls after discharge.   Unfortunately he has been to the ED twice with hepatic encephalopathy and was hospitalized briefly 2/26-2/28. He admitted to not taking his lactulose as directed due to moving to a new apartment.   He presents today for follow up. He has had no encephalopathy since discharge with lactulose compliance.  No blood in stool or vomit.  He has minimal diffuse abdominal discomfort.  No jaundice or scleral icterus.    Past Medical History:  Diagnosis Date   Allergy    Phreesia 03/01/2020   Arthritis    Phreesia 03/01/2020   Cataract    Phreesia 03/01/2020   Chest pain    Diagnostic cardiac cath October 2012. Normal coronary arteries, left ventricular function   COPD (chronic obstructive pulmonary disease) (HCC)    Phreesia 03/01/2020   DDD (degenerative disc disease), lumbar    Diabetes mellitus    Diabetes mellitus without complication (HCC)    Phreesia 03/01/2020   Heart murmur    History of back surgery    History of neck surgery    HLD (hyperlipidemia) 12/09/2012   Hyperlipidemia  Phreesia 03/01/2020   Hypertension    Obesity    Right knee injury    Sleep apnea    Smoker     Past Surgical History:  Procedure Laterality Date   APPENDECTOMY     BACK SURGERY     for ruptured disc lumbar area    BIOPSY  06/01/2023   Procedure: BIOPSY;  Surgeon: [name], MD;  Location: [facility];  Service: Gastroenterology;;   CARDIAC CATHETERIZATION  Oct 2012   nml coronary arteries, aorta, LV fcn   COLONOSCOPY WITH PROPOFOL N/A 01/31/2023   Procedure: COLONOSCOPY WITH PROPOFOL;  Surgeon: [name], MD;  Location: [facility];  Service: Gastroenterology;  Laterality: N/A;  12:45pm;ASA 1-2   ESOPHAGOGASTRODUODENOSCOPY (EGD) WITH PROPOFOL N/A 06/01/2023   Procedure: ESOPHAGOGASTRODUODENOSCOPY (EGD) WITH PROPOFOL;  Surgeon: [name], MD;  Location: [facility];  Service: Gastroenterology;  Laterality: N/A;  9:15AM;ASA 3   EYE SURGERY N/A    Phreesia 03/01/2020   HEMOSTASIS CLIP PLACEMENT  01/31/2023   Procedure: HEMOSTASIS CLIP PLACEMENT;  Surgeon: [name], MD;  Location: [facility];  Service: Gastroenterology;;   I & D EXTREMITY Right 02/06/2013   Procedure: IRRIGATION AND DEBRIDEMENT INDEX FINGER;  Surgeon: Tami Ribas, MD;  Location: [facility];  Service: Orthopedics;  Laterality: Right;   IR ANGIOGRAM SELECTIVE EACH ADDITIONAL VESSEL  08/28/2023   IR ANGIOGRAM SELECTIVE EACH ADDITIONAL VESSEL  08/28/2023   IR EMBO ART  VEN HEMORR LYMPH EXTRAV  INC GUIDE ROADMAPPING  08/28/2023   IR EMBO ART  VEN HEMORR LYMPH EXTRAV  INC GUIDE ROADMAPPING  08/28/2023   IR EMBO VENOUS NOT HEMORR HEMANG  INC GUIDE ROADMAPPING  08/28/2023   IR INTRAVASCULAR ULTRASOUND NON CORONARY  08/28/2023   IR RADIOLOGIST EVAL & MGMT  06/20/2023   IR RADIOLOGIST EVAL & MGMT  08/10/2023   IR TIPS  08/28/2023   IR US GUIDE VASC ACCESS RIGHT  08/28/2023   IR US GUIDE VASC ACCESS RIGHT  08/28/2023   NECK SURGERY     for ruptured disc   POLYPECTOMY  01/31/2023   Procedure: POLYPECTOMY;  Surgeon: [name], MD;  Location: [facility];  Service: Gastroenterology;;   SPINE SURGERY N/A    Phreesia 03/01/2020   TIPS PROCEDURE N/A 08/28/2023   Procedure:  TRANS-JUGULAR INTRAHEPATIC PORTAL SHUNT (TIPS);  Surgeon: Bennie Dallas, MD;  Location: [facility] OR;  Service: Radiology;  Laterality: N/A;   TONSILLECTOMY      Allergies: Lipitor [atorvastatin calcium], Sulfa antibiotics, and Zocor [simvastatin - high dose]  Medications: Prior to Admission medications   Medication Sig Start Date End Date Taking? Authorizing Provider  acetaminophen (TYLENOL) 500 MG tablet Take 1,000 mg by mouth every 8 (eight) hours as needed for moderate pain.    [provider]  albuterol (VENTOLIN HFA) 108 (90 Base) MCG/ACT inhaler Inhale 1-2 puffs into the lungs every 4 (four) hours as needed for wheezing or shortness of breath.    [provider]  carvedilol (COREG) 3.125 MG tablet Take 1 tablet (3.125 mg total) by mouth 2 (two) times daily with a meal. 06/01/23   Marguerita Merles, Reuel Boom, MD  Clobetasol Propionate 0.05 % lotion Apply 1 Application topically as needed (psoriasis). 11/24/22   [provider]  ezetimibe (ZETIA) 10 MG tablet TAKE 1 TABLET EVERY DAY 08/30/21   Donita Brooks, MD  ipratropium (ATROVENT) 0.03 % nasal spray Place 1-2 sprays into both nostrils at bedtime. 08/16/23  [provider]  lactulose (CHRONULAC) 10 GM/15ML solution Take 30 mLs (20 g total) by mouth 3 (three) times daily. To make at least 2-3 bowel movements a day 09/20/23   Dorcas Carrow, MD  omeprazole (PRILOSEC) 40 MG capsule Take 1 capsule (40 mg total) by mouth daily. 06/01/23   [name], MD  vitamin E 180 MG (400 UNITS) capsule Take 400 Units by mouth daily.    [provider]  zinc gluconate 50 MG tablet Take 50 mg by mouth daily.    [provider]     Family History  Problem Relation Age of Onset   Dementia Mother    Heart disease Father     Social History   Socioeconomic History   Marital status: Married    Spouse name: Fleet Contras   Number of children: 2   Years of education: Not on file   Highest education  level: Not on file  Occupational History   Occupation: Disabled    Employer: [employer]  Tobacco Use   Smoking status: Every Day    Current packs/day: 2.00    Types: Cigarettes   Smokeless tobacco: Never   Tobacco comments:    Vaping more then cigs  Vaping Use   Vaping status: Former   Quit date: 07/24/2021  Substance and Sexual Activity   Alcohol use: No   Drug use: No   Sexual activity: Not on file  Other Topics Concern   Not on file  Social History Narrative   1 step-son. 1 daughter.    4 grandchildren.   Social Drivers of Health   Financial Resource Strain: Medium Risk (06/02/2021)   Overall Financial Resource Strain (CARDIA)    Difficulty of Paying Living Expenses: Somewhat hard  Food Insecurity: Patient Declined (08/28/2023)   Hunger Vital Sign    Worried About Running Out of Food in the Last Year: Patient declined    Ran Out of Food in the Last Year: Patient declined  Transportation Needs: No Transportation Needs (08/28/2023)   PRAPARE - Administrator, Civil Service (Medical): No    Lack of Transportation (Non-Medical): No  Physical Activity: Insufficiently Active (06/02/2021)   Exercise Vital Sign    Days of Exercise per Week: 2 days    Minutes of Exercise per Session: 10 min  Stress: Stress Concern Present (06/02/2021)   [employer] of Occupational Health - Occupational Stress Questionnaire    Feeling of Stress : To some extent  Social Connections: Socially Integrated (08/28/2023)   Social Connection and Isolation Panel [NHANES]    Frequency of Communication with Friends and Family: More than three times a week    Frequency of Social Gatherings with Friends and Family: Once a week    Attends Religious Services: 1 to 4 times per year    Active Member of [employer] or Organizations: Yes    Attends Banker Meetings: 1 to 4 times per year    Marital Status: Married     Vital Signs: There were no vitals taken for this  visit.  Physical Exam Constitutional:      General: He is not in acute distress. HENT:     Head: Normocephalic.     Mouth/Throat:     Mouth: Mucous membranes are moist.  Eyes:     General: No scleral icterus. Cardiovascular:     Rate and Rhythm: Normal rate and regular rhythm.  Pulmonary:     Effort: No respiratory distress.  Abdominal:  General: There is no distension.  Musculoskeletal:     Right lower leg: No edema.     Left lower leg: No edema.  Skin:    General: Skin is warm and dry.     Coloration: Skin is not jaundiced.  Neurological:     Mental Status: He is alert and oriented to person, place, and time.     Imaging:  CT Chest 02/16/23   Cirrhosis, splenomegaly, suggestion of large gastric varices with gastrorenal shunt.   US Hepatic Doppler (05/21/23) IMPRESSION: Directed duplex of the hepatic vasculature is unremarkable    CTA [facility] 07/12/23  Patent portal system    Large gastric varix draining via left renal vein  CT Abdomen/Pelvis 09/20/23       Echocardiogram 07/16/23 IMPRESSIONS   1. Left ventricular ejection fraction, by estimation, is 60 to 65%. Left  ventricular ejection fraction by 3D volume is 59 %. The left ventricle has  normal function. The left ventricle has no regional wall motion  abnormalities. The left ventricular  internal cavity size was mildly dilated. Left ventricular diastolic  parameters are consistent with Grade I diastolic dysfunction (impaired  relaxation). The average left ventricular global longitudinal strain is  -22.5 %. The global longitudinal strain is  normal.   2. Right ventricular systolic function is low normal. The right  ventricular size is mildly enlarged. There is normal pulmonary artery  systolic pressure. The estimated right ventricular systolic pressure is  28.1 mmHg.   3. Left atrial size was moderately dilated.   4. Right atrial size was moderately dilated.   5. The mitral valve is degenerative.  Mild mitral valve regurgitation. No  evidence of mitral stenosis.   6. The aortic valve is calcified. There is moderate calcification of the  aortic valve. There is moderate thickening of the aortic valve. Aortic  valve regurgitation is not visualized. Mild aortic valve stenosis. Aortic  valve area, by VTI measures 1.54  cm. Aortic valve mean gradient measures 18.8 mmHg. Aortic valve Vmax  measures 2.92 m/s.   7. The inferior vena cava is dilated in size with >50% respiratory  variability, suggesting right atrial pressure of 8 mmHg.   Comparison(s): 2022 AV mean gradient 17 mmHg.     Labs:  CBC: Recent Labs    09/12/23 1246 09/19/23 1617 09/20/23 0121 09/20/23 0242  WBC 6.3 6.2 6.7 6.2  HGB 12.5* 12.5* 11.9* 11.7*  HCT 37.7* 36.1* 34.0* 34.1*  PLT 143* 133* 100* 100*    COAGS: Recent Labs    05/10/23 1028 08/28/23 1107 08/29/23 0625 09/19/23 1418  INR 1.1 1.2 1.3* 1.3*  APTT  --   --   --  23*    BMP: Recent Labs    08/29/23 0625 09/12/23 1246 09/19/23 1418 09/20/23 0121  NA 135 141 142 140  K 4.2 3.7 4.1 3.4*  CL 103 109 110 109  CO2 21* 23 22 21*  GLUCOSE 103* 104* 97 94  BUN 10 13 9 9   CALCIUM 9.0 9.2 9.5 9.1  CREATININE 0.88 1.10 1.03 0.95  GFRNONAA >60 >60 >60 >60    LIVER FUNCTION TESTS: Recent Labs    08/29/23 0625 09/12/23 1246 09/19/23 1418 09/20/23 0121  BILITOT 1.1 1.6* 1.8* 2.0*  AST 26 32 41 35  ALT 17 18 23 20   ALKPHOS 55 79 65 58  PROT 6.2* 7.0 6.9 6.3*  ALBUMIN 3.1* 3.4* 3.6 3.1*    Assessment and Plan: 73 year old male with a history of compensated  MASH cirrhosis (CP A6, MELD 7) with portal hypertension 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  On 08/28/23 he underwent TIPS creation with embolization of the left gastric and short gastric veins as well as coil-assisted retrograde transvenous obliteration of gastrorenal shunt and varices. Due to lactulose non-compliance required  admission for hepatic encephalopathy, which has been rectified with lactulose compliance.  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  He has had a mild elevation in bilirubin.  -continue lactulose compliance, titrating to 2-3 bowel movements per day -could consider stopping coreg after embolization/obliteration of gastric varices +TIPS, will defer to Dr. Levon Hedger -start aspirin 81 mg daily given portal thrombus -TIPS duplex in 1 month with clinic visit shortly thereafter  Marliss Coots, MD Pager: [phone] Clinic: [phone]    I spent a total of 40 Minutes in face to face in clinical consultation, greater than 50% of which was counseling/coordinating care for portal hypertension.

## 2023-09-24 ENCOUNTER — Ambulatory Visit
Admission: RE | Admit: 2023-09-24 | Discharge: 2023-09-24 | Disposition: A | Discharge status: Home / Self Care | Payer: Medicare HMO | Source: Ambulatory Visit | Attending: Student | Admitting: Student

## 2023-09-24 DIAGNOSIS — Z95828 Presence of other vascular implants and grafts: Secondary | ICD-10-CM

## 2023-09-24 DIAGNOSIS — K766 Portal hypertension: Secondary | ICD-10-CM | POA: Diagnosis not present

## 2023-09-24 HISTORY — PX: IR RADIOLOGIST EVAL & MGMT: IMG5224

## 2023-09-25 ENCOUNTER — Inpatient Hospital Stay (HOSPITAL_COMMUNITY): Payer: Medicare HMO

## 2023-09-25 ENCOUNTER — Other Ambulatory Visit: Payer: Self-pay | Admitting: Interventional Radiology

## 2023-09-25 DIAGNOSIS — Z95828 Presence of other vascular implants and grafts: Secondary | ICD-10-CM

## 2023-09-26 ENCOUNTER — Other Ambulatory Visit (INDEPENDENT_AMBULATORY_CARE_PROVIDER_SITE_OTHER): Payer: Self-pay | Admitting: Gastroenterology

## 2023-09-26 DIAGNOSIS — K5904 Chronic idiopathic constipation: Secondary | ICD-10-CM

## 2023-09-26 DIAGNOSIS — K7460 Unspecified cirrhosis of liver: Secondary | ICD-10-CM

## 2023-10-10 DIAGNOSIS — J069 Acute upper respiratory infection, unspecified: Secondary | ICD-10-CM | POA: Diagnosis not present

## 2023-10-10 DIAGNOSIS — R42 Dizziness and giddiness: Secondary | ICD-10-CM | POA: Diagnosis not present

## 2023-10-26 ENCOUNTER — Ambulatory Visit
Admission: RE | Admit: 2023-10-26 | Discharge: 2023-10-26 | Disposition: A | Discharge status: Home / Self Care | Source: Ambulatory Visit | Attending: Interventional Radiology | Admitting: Interventional Radiology

## 2023-10-26 DIAGNOSIS — Z95828 Presence of other vascular implants and grafts: Secondary | ICD-10-CM

## 2023-10-26 NOTE — Progress Notes (Signed)
 Referring Physician(s): [name]   Chief Complaint: The patient is seen in follow up today s/p TIPS 08/28/23  History of present illness: HPI from last clinic visit 09/24/23 Ricky Rios is a 73 y.o. male with a medical history significant for DM, COPD, HTN, sleep apnea, colonic polyps and NASH cirrhosis. The patient is also a smoker and had a lung cancer screening CT in July 2024. This showed cirrhosis with splenomegaly and vascular collateralization in the left abdomen. His GI doctor brought him in for an EDG 06/01/23 which revealed Grade II esophageal and gastric varices with no stigmata of recent bleeding. During a clinic visit the patient's wife reported that the patient was having trouble with his memory and he was started on lactulose.   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 He presents today alongside his wife. He denies any prior episodes of hematemesis. He does have occasional melena, however confounded by known hemorrhoids.   Additional imaging was needed to determine his candidacy for intervention and he underwent CT imaging and echocardiogram. He has been seen by Cardiology and diagnosed with mild aortic stenosis. He has good ejection fraction. CTA demonstrates a large gastric varix with gastrorenal shunt.   We discussed in depth the rationale of transvenous obliteration of gastric varices in addition to TIPS creation. We discussed that obliteration of gastric varices would significantly decrease chances of fatal gastrointestinal hemorrhage, however would not improve his underlying portal hypertension, which would be corrected with TIPS. We discussed that TIPS introduces another portosystemic shunt, increasing his risk of worsening hepatic encephalopathy, but with embolization of the gastrorenal shunt we expect him to have no greater encephalopathy than he does  now, which is minimal. He and his wife were in favor of the more comprehensive approach of combined TIPS + retrograde transvenous obliteration of gastric varix. We discussed additional risks and benefits associated with this procedure and he was in agreement to proceed.   On 08/28/23 he underwent TIPS creation with embolization of the left gastric and short gastric veins as well as coil-assisted retrograde transvenous obliteration of gastrorenal shunt and varices. He tolerated the procedure well and was discharged home the next day. He was instructed on post-procedure care including the importance of taking his lactulose as directed and he was followed with regular phone calls after discharge.   Unfortunately he has been to the ED twice with hepatic encephalopathy and was hospitalized briefly 2/26-2/28. He admitted to not taking his lactulose as directed due to moving to a new apartment.  Imaging obtained during that hospitalization showed a patent TIPS shunt with a significant decrease in velocity. Imaging also showed areas of thrombus in the left and right intrahepatic portal veins. When he last followed up with me 09/24/23 he reported compliance with lactulose and no issues with encephalopathy since hospital discharge. We discussed lactulose compliance, starting 81 mg aspirin for portal thrombus and repeat imaging in one month with TIPS duplex. The patient was agreeable to this plan and he presents today for follow up.  He had another episode of encephalopathy approximately 3 weeks ago due to lactulose non-compliance.  He has been free of encephalopathy since.  No upper or lower GI bleeding. He endorses some mild edema in his legs.  Past Medical History:  Diagnosis Date   Allergy    Phreesia 03/01/2020   Arthritis    Phreesia 03/01/2020   Cataract    Phreesia  03/01/2020   Chest pain    Diagnostic cardiac cath October 2012. Normal coronary arteries, left ventricular function   COPD (chronic  obstructive pulmonary disease) (HCC)    Phreesia 03/01/2020   DDD (degenerative disc disease), lumbar    Diabetes mellitus    Diabetes mellitus without complication (HCC)    Phreesia 03/01/2020   Heart murmur    History of back surgery    History of neck surgery    HLD (hyperlipidemia) 12/09/2012   Hyperlipidemia    Phreesia 03/01/2020   Hypertension    Obesity    Right knee injury    Sleep apnea    Smoker     Past Surgical History:  Procedure Laterality Date   APPENDECTOMY     BACK SURGERY     for ruptured disc lumbar area   BIOPSY  06/01/2023   Procedure: BIOPSY;  Surgeon: [name], MD;  Location: [facility];  Service: Gastroenterology;;   CARDIAC CATHETERIZATION  Oct 2012   nml coronary arteries, aorta, LV fcn   COLONOSCOPY WITH PROPOFOL N/A 01/31/2023   Procedure: COLONOSCOPY WITH PROPOFOL;  Surgeon: [name], MD;  Location: [facility];  Service: Gastroenterology;  Laterality: N/A;  12:45pm;ASA 1-2   ESOPHAGOGASTRODUODENOSCOPY (EGD) WITH PROPOFOL N/A 06/01/2023   Procedure: ESOPHAGOGASTRODUODENOSCOPY (EGD) WITH PROPOFOL;  Surgeon: [name], MD;  Location: [facility];  Service: Gastroenterology;  Laterality: N/A;  9:15AM;ASA 3   EYE SURGERY N/A    Phreesia 03/01/2020   HEMOSTASIS CLIP PLACEMENT  01/31/2023   Procedure: HEMOSTASIS CLIP PLACEMENT;  Surgeon: [name], MD;  Location: [facility];  Service: Gastroenterology;;   I & D EXTREMITY Right 02/06/2013   Procedure: IRRIGATION AND DEBRIDEMENT INDEX FINGER;  Surgeon: Tami Ribas, MD;  Location: [facility];  Service: Orthopedics;  Laterality: Right;   IR ANGIOGRAM SELECTIVE EACH ADDITIONAL VESSEL  08/28/2023   IR ANGIOGRAM SELECTIVE EACH ADDITIONAL VESSEL  08/28/2023   IR EMBO ART  VEN HEMORR LYMPH EXTRAV  INC GUIDE ROADMAPPING  08/28/2023   IR EMBO ART  VEN HEMORR LYMPH EXTRAV  INC GUIDE ROADMAPPING  08/28/2023   IR EMBO VENOUS NOT HEMORR HEMANG  INC GUIDE  ROADMAPPING  08/28/2023   IR INTRAVASCULAR ULTRASOUND NON CORONARY  08/28/2023   IR RADIOLOGIST EVAL & MGMT  06/20/2023   IR RADIOLOGIST EVAL & MGMT  08/10/2023   IR RADIOLOGIST EVAL & MGMT  09/24/2023   IR TIPS  08/28/2023   IR US GUIDE VASC ACCESS RIGHT  08/28/2023   IR US GUIDE VASC ACCESS RIGHT  08/28/2023   NECK SURGERY     for ruptured disc   POLYPECTOMY  01/31/2023   Procedure: POLYPECTOMY;  Surgeon: [name], MD;  [facility]: [facility];  Service: Gastroenterology;;   SPINE SURGERY N/A    Phreesia 03/01/2020   TIPS PROCEDURE N/A 08/28/2023   Procedure: TRANS-JUGULAR INTRAHEPATIC PORTAL SHUNT (TIPS);  Surgeon: Bennie Dallas, MD;  Location: [facility] OR;  Service: Radiology;  Laterality: N/A;   TONSILLECTOMY      Allergies: Lipitor [atorvastatin calcium], Sulfa antibiotics, and Zocor [simvastatin - high dose]  Medications: Prior to Admission medications   Medication Sig Start Date End Date Taking? Authorizing Provider  acetaminophen (TYLENOL) 500 MG tablet Take 1,000 mg by mouth every 8 (eight) hours as needed for moderate pain.    [provider]  albuterol (VENTOLIN HFA) 108 (90 Base) MCG/ACT inhaler Inhale 1-2 puffs into the lungs every 4 (four)  hours as needed for wheezing or shortness of breath.    [provider]  carvedilol (COREG) 3.125 MG tablet Take 1 tablet (3.125 mg total) by mouth 2 (two) times daily with a meal. 06/01/23   Marguerita Merles, Reuel Boom, MD  Clobetasol Propionate 0.05 % lotion Apply 1 Application topically as needed (psoriasis). 11/24/22   [provider]  ezetimibe (ZETIA) 10 MG tablet TAKE 1 TABLET EVERY DAY 08/30/21   Donita Brooks, MD  ipratropium (ATROVENT) 0.03 % nasal spray Place 1-2 sprays into both nostrils at bedtime. 08/16/23   [provider]  lactulose (CONSTULOSE) 10 GM/15ML solution TAKE BY MOUTH TWICE DAILY 09/26/23   Marguerita Merles, Reuel Boom, MD  omeprazole (PRILOSEC) 40 MG capsule Take 1 capsule (40  mg total) by mouth daily. 06/01/23   [name], MD  vitamin E 180 MG (400 UNITS) capsule Take 400 Units by mouth daily.    [provider]  zinc gluconate 50 MG tablet Take 50 mg by mouth daily.    [provider]     Family History  Problem Relation Age of Onset   Dementia Mother    Heart disease Father     Social History   Socioeconomic History   Marital status: Married    Spouse name: Fleet Contras   Number of children: 2   Years of education: Not on file   Highest education level: Not on file  Occupational History   Occupation: Disabled    Employer: [employer]  Tobacco Use   Smoking status: Every Day    Current packs/day: 2.00    Types: Cigarettes   Smokeless tobacco: Never   Tobacco comments:    Vaping more then cigs  Vaping Use   Vaping status: Former   Quit date: 07/24/2021  Substance and Sexual Activity   Alcohol use: No   Drug use: No   Sexual activity: Not on file  Other Topics Concern   Not on file  Social History Narrative   1 step-son. 1 daughter.    4 grandchildren.   Social Drivers of Health   Financial Resource Strain: Medium Risk (06/02/2021)   Overall Financial Resource Strain (CARDIA)    Difficulty of Paying Living Expenses: Somewhat hard  Food Insecurity: Patient Declined (08/28/2023)   Hunger Vital Sign    Worried About Running Out of Food in the Last Year: Patient declined    Ran Out of Food in the Last Year: Patient declined  Transportation Needs: No Transportation Needs (08/28/2023)   PRAPARE - Administrator, Civil Service (Medical): No    Lack of Transportation (Non-Medical): No  Physical Activity: Insufficiently Active (06/02/2021)   Exercise Vital Sign    Days of Exercise per Week: 2 days    Minutes of Exercise per Session: 10 min  Stress: Stress Concern Present (06/02/2021)   [employer] of Occupational Health - Occupational Stress Questionnaire    Feeling of Stress : To some extent   Social Connections: Socially Integrated (08/28/2023)   Social Connection and Isolation Panel [NHANES]    Frequency of Communication with Friends and Family: More than three times a week    Frequency of Social Gatherings with Friends and Family: Once a week    Attends Religious Services: 1 to 4 times per year    Active Member of [employer] or Organizations: Yes    Attends Banker Meetings: 1 to 4 times per year    Marital Status: Married  Vital Signs: There were no vitals taken for this visit.  Physical Exam  Imaging: CT Chest 02/16/23   Cirrhosis, splenomegaly, suggestion of large gastric varices with gastrorenal shunt.   US Hepatic Doppler (05/21/23) IMPRESSION: Directed duplex of the hepatic vasculature is unremarkable    CTA [facility] 07/12/23  Patent portal system    Large gastric varix draining via left renal vein   CT Abdomen/Pelvis 09/20/23       US Hepatic Duplex 10/26/23 IMPRESSION: Patent TIPS stent with hepatofugal flow noted within the mid and distal portions of the stent.    Echocardiogram 07/16/23 IMPRESSIONS   1. Left ventricular ejection fraction, by estimation, is 60 to 65%. Left  ventricular ejection fraction by 3D volume is 59 %. The left ventricle has  normal function. The left ventricle has no regional wall motion  abnormalities. The left ventricular  internal cavity size was mildly dilated. Left ventricular diastolic  parameters are consistent with Grade I diastolic dysfunction (impaired  relaxation). The average left ventricular global longitudinal strain is  -22.5 %. The global longitudinal strain is  normal.   2. Right ventricular systolic function is low normal. The right  ventricular size is mildly enlarged. There is normal pulmonary artery  systolic pressure. The estimated right ventricular systolic pressure is  28.1 mmHg.   3. Left atrial size was moderately dilated.   4. Right atrial size was moderately dilated.   5. The mitral  valve is degenerative. Mild mitral valve regurgitation. No  evidence of mitral stenosis.   6. The aortic valve is calcified. There is moderate calcification of the  aortic valve. There is moderate thickening of the aortic valve. Aortic  valve regurgitation is not visualized. Mild aortic valve stenosis. Aortic  valve area, by VTI measures 1.54  cm. Aortic valve mean gradient measures 18.8 mmHg. Aortic valve Vmax  measures 2.92 m/s.   7. The inferior vena cava is dilated in size with >50% respiratory  variability, suggesting right atrial pressure of 8 mmHg.   Comparison(s): 2022 AV mean gradient 17 mmHg.    Labs:  CBC: Recent Labs    09/12/23 1246 09/19/23 1617 09/20/23 0121 09/20/23 0242  WBC 6.3 6.2 6.7 6.2  HGB 12.5* 12.5* 11.9* 11.7*  HCT 37.7* 36.1* 34.0* 34.1*  PLT 143* 133* 100* 100*    COAGS: Recent Labs    05/10/23 1028 08/28/23 1107 08/29/23 0625 09/19/23 1418  INR 1.1 1.2 1.3* 1.3*  APTT  --   --   --  23*    BMP: Recent Labs    08/29/23 0625 09/12/23 1246 09/19/23 1418 09/20/23 0121  NA 135 141 142 140  K 4.2 3.7 4.1 3.4*  CL 103 109 110 109  CO2 21* 23 22 21*  GLUCOSE 103* 104* 97 94  BUN 10 13 9 9   CALCIUM 9.0 9.2 9.5 9.1  CREATININE 0.88 1.10 1.03 0.95  GFRNONAA >60 >60 >60 >60    LIVER FUNCTION TESTS: Recent Labs    08/29/23 0625 09/12/23 1246 09/19/23 1418 09/20/23 0121  BILITOT 1.1 1.6* 1.8* 2.0*  AST 26 32 41 35  ALT 17 18 23 20   ALKPHOS 55 79 65 58  PROT 6.2* 7.0 6.9 6.3*  ALBUMIN 3.1* 3.4* 3.6 3.1*    Assessment and Plan: 73 year old male with a history of compensated MASH cirrhosis (CP A6, MELD 7) with portal hypertension 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 On 08/28/23 he underwent TIPS creation with  embolization of the left gastric and short gastric veins as well as coil-assisted retrograde transvenous obliteration of gastrorenal shunt and varices. Due to lactulose  non-compliance he required admission for hepatic encephalopathy, which has been rectified with lactulose compliance. Post procedure CT demonstrated obliteration of the gastric varix and patent TIPS, however non-occlusive thrombus within the intrahepatic right and left portal veins. He began taking 81 mg aspirin daily.  Follow up US demonstrates patency of the TIPS and portal veins.    -continue lactulose compliance, titrating to 2-3 bowel movements per day -will attempt to add rifaximin, pending insurance approval -could consider stopping coreg after embolization/obliteration of gastric varices +TIPS, will defer to Dr. Levon Hedger -continue aspirin 81 mg daily given prior portal thrombus -CTA abdomen/pelvis BRTO protocol in 3 months with IR clinic visit shortly thereafter   Marliss Coots, MD Pager: [phone] Clinic: [phone]    I spent a total of 40 Minutes in face to face in clinical consultation, greater than 50% of which was counseling/coordinating care for portal hypertension

## 2023-10-29 ENCOUNTER — Ambulatory Visit
Admission: RE | Admit: 2023-10-29 | Discharge: 2023-10-29 | Disposition: A | Discharge status: Home / Self Care | Source: Ambulatory Visit | Attending: Interventional Radiology

## 2023-10-29 DIAGNOSIS — Z95828 Presence of other vascular implants and grafts: Secondary | ICD-10-CM | POA: Diagnosis not present

## 2023-10-29 DIAGNOSIS — I81 Portal vein thrombosis: Secondary | ICD-10-CM | POA: Diagnosis not present

## 2023-10-30 ENCOUNTER — Telehealth (HOSPITAL_COMMUNITY): Payer: Self-pay | Admitting: Student

## 2023-10-30 DIAGNOSIS — K7460 Unspecified cirrhosis of liver: Secondary | ICD-10-CM

## 2023-10-30 DIAGNOSIS — K5904 Chronic idiopathic constipation: Secondary | ICD-10-CM

## 2023-10-30 MED ORDER — RIFAXIMIN 550 MG PO TABS
550.0000 mg | ORAL_TABLET | Freq: Two times a day (BID) | ORAL | 6 refills | Status: AC
Start: 2023-10-30 — End: 2024-05-27

## 2023-10-30 MED ORDER — LACTULOSE 10 GM/15ML PO SOLN: 10.0000 g | Freq: Two times a day (BID) | ORAL | 11 refills | Status: DC

## 2023-10-30 NOTE — Telephone Encounter (Signed)
 Rifaximin and lactulose e-prescribed to the patient's pharmacy.   Alwyn Ren, AGACNP-BC 10/30/2023, 9:50 AM

## 2023-11-01 ENCOUNTER — Encounter (INDEPENDENT_AMBULATORY_CARE_PROVIDER_SITE_OTHER): Payer: Self-pay | Admitting: *Deleted

## 2023-11-05 ENCOUNTER — Other Ambulatory Visit: Payer: Self-pay | Admitting: Student

## 2023-11-05 DIAGNOSIS — Z95828 Presence of other vascular implants and grafts: Secondary | ICD-10-CM

## 2023-11-05 MED ORDER — LACTULOSE 10 GM/15ML PO SOLN: 20.0000 g | Freq: Three times a day (TID) | ORAL | 0 refills | Status: AC

## 2023-11-05 NOTE — Progress Notes (Signed)
 Interventional Radiology Brief Note:   Patient's wife called to report patient feeling poorly, tired with swollen legs and ankles.  States it is uncomfortable to walk due to degree of swelling.  Also reports he has not had a bowel movement 2 days.    Wife reports that patient has not started his rifaximin due to pending insurance authorization.  He also continues to take his Coreg twice daily as prescribed.  Patient has been unable to increase his lactulose dosing due to limited quantity given by the pharmacy.   Patient complaints of malaise, fatigue, swollen lower extremities, and ankles.  States he is peeing less despite conitnuing to hydrate well.  Occasionally feels short of breath described as dyspnea on exertion.  Wife denies jaundice, or sceral icterus.  Reports his urine is darker in color.   Patient baseline BMP at TIPS placement showed adequate CrCl and BUN, however patient does have a history of proteinuria.   Reviewed procedure and symptoms again with patient and wife.  Will update pharmacy dosing to allow for more consistent lactulose dosing and usage at home for better bowel movements.   Encouraged patient to call his PCP regarding fluid changes as despite change in regimen (add rifaximin, stop Coreg, increase lactulose) suggested at his most recent follow-up with Dr. Jinx Mourning none of these changes have thus far been implemented and do not appear to be contributing to his current change in status.   Wife will call with questions and plans to reach out to PCP.   Orra Nolde, MS RD PA-C

## 2023-11-08 ENCOUNTER — Encounter (INDEPENDENT_AMBULATORY_CARE_PROVIDER_SITE_OTHER): Payer: Self-pay | Admitting: Gastroenterology

## 2023-11-08 ENCOUNTER — Telehealth (INDEPENDENT_AMBULATORY_CARE_PROVIDER_SITE_OTHER): Payer: Self-pay

## 2023-11-08 ENCOUNTER — Ambulatory Visit (INDEPENDENT_AMBULATORY_CARE_PROVIDER_SITE_OTHER): Payer: Medicare HMO | Admitting: Gastroenterology

## 2023-11-08 VITALS — BP 122/69 | HR 75 | Temp 97.1°F | Ht 73.0 in | Wt 261.2 lb

## 2023-11-08 DIAGNOSIS — R600 Localized edema: Secondary | ICD-10-CM

## 2023-11-08 DIAGNOSIS — K7460 Unspecified cirrhosis of liver: Secondary | ICD-10-CM

## 2023-11-08 DIAGNOSIS — K7581 Nonalcoholic steatohepatitis (NASH): Secondary | ICD-10-CM | POA: Diagnosis not present

## 2023-11-08 DIAGNOSIS — R609 Edema, unspecified: Secondary | ICD-10-CM | POA: Diagnosis not present

## 2023-11-08 NOTE — Patient Instructions (Addendum)
-  We will check some labs today -Please continue with lactulose three times per day -We will see where we are in the process of them getting your xifaxan approved -For now, we will continue with coreg 3.125mg  twice daily - Reduce salt intake to <2 g per day - Can take Tylenol max of 2 g per day (650 mg q8h) for pain - Avoid NSAIDs for pain - Avoid eating raw oysters/shellfish - Ensure every night before going to sleep  Follow up 2 months  It was a pleasure to see you today. I want to create trusting relationships with patients and provide genuine, compassionate, and quality care. I truly value your feedback! please be on the lookout for a survey regarding your visit with me today. I appreciate your input about our visit and your time in completing this!    Doratha Mcswain L. Laloni Rowton, MSN, APRN, AGNP-C Adult-Gerontology Nurse Practitioner [facility] at [facility]

## 2023-11-08 NOTE — Telephone Encounter (Signed)
 Patient started on Xifaxan 550 bid started by Alicia Apa, NP @ Dr. Francisco Irving office. [phone]. Per Gayle Kava Np here at [facility], please call their office and check the status of this.

## 2023-11-08 NOTE — Progress Notes (Signed)
 Referring Provider: Benedetto Brady, MD Primary Care Physician:  Benedetto Brady, MD Primary GI Physician: Dr. Sammi Crick   Chief Complaint  Patient presents with   Follow-up    Patient here today for a follow up on cirrhosis. Patient says he had a recent TIPs procedure on Aug 28, 2023. Patient is having some issues with Left sided abdominal edema, he has had some issues with confusion. He is taking Lactulose  30 ml Tid. He is having at least one bm per day, some days he has between 1-3 per day, and are soft, dark "pudding" consistency. He is also taking Xifaxan  550 bid. He is not on any diuretic. He does taked carvedilol  3.125 bid.   HPI:   Ricky Rios is a 73 y.o. male with past medical history of NASH cirrhosis, COPD, diabetes, hyperlipidemia, hypertension,   Patient presenting today for:  Cirrhosis follow up  Last seen October 2024, at that time presenting as a new patient for cirrhosis management.  Reports some fresh blood in stool intermittently, having to strain with bowel movements.  Reported some issues with his memory.  Recommended start lactulose  20 g daily, schedule EGD, check MELD labs CBC AFP, referral to genetic counselor, schedule liver Doppler, smoking cessation  TIPS procedure done in August 28 2023  Presented to the ED x2 for HE, hospitalized 2/26-2/28, admitted to not taking lactulose  compliantly at that time.   Saw Dr. 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 He is taking lactulose  30ml TID, has not gotten  xifaxan  yet as this is currently still trying to get approved by his insurance by Dr. Aleen Huron office, he is having 1-2 BMs per day with softer stools. Denies episodes of confusion but reports he has some issues with his memory like not remembering certain appointments or where he put something. Ammonia on 2/26 was 97.   He notes some swelling on the Left lower side of his abdomen, he has some discomfort there when laying down as well. He denies tightness in the abdomen but just some fullness in LLQ. Appetite is pretty good. Weight is stable. He has some swelling in LE with pitting edema. Denies SOB. He has some itching from psoriasis. Denies jaundice.   Previous MELD 3.0-February:   Cirrhosis related questions: Episodes of confusion/disorientation:  as above  Taking diuretics? No  Beta blockers? Coreg  3.25mg  BID Prior history of variceal banding?  Prior episodes of SBP? No  no Last liver imaging: 08/2023 Some limitation of the intrahepatic portal system. 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 Nodular heterogeneous liver.    There is a patent tips shunt noted but there is significant decrease in velocity along the course of the tips by more than 50%. Significant stenosis is possible. Please correlate with symptomatology. Alcohol ZOX:WRUE    Last EGD: 05/2023 - Grade II esophageal varices with no stigmata of  recent bleeding.                           - Type 2 gastroesophageal varices (GOV2, esophageal                            varices which extend along the fundus), without                            bleeding.                           - Non-bleeding gastric ulcers with a clean ulcer                            base (Forrest Class III). Biopsied.                           - Normal examined duodenum. Last Colonoscopy: 01/31/2023 - Three 3 to 8 mm polyps in the ascending colon, removed with a cold snare. Resected  and retrieved. Clip was placed. Clip manufacturer: [employer]. - One 14 mm polyp in the ascending colon, removed with a hot snare. Resected and retrieved. Clip was placed. Clip manufacturer: [employer]. - Eight 3 to 10 mm polyps in the transverse colon, removed with a cold snare. Resected and retrieved. - Twelve 2 to 10 mm polyps in the rectum, in the sigmoid colon and in the descending colon, removed with a cold snare. Resected and retrieved. - Non- bleeding internal hemorrhoids.   Pathology showed 24 tubular adenomas   Recommend a repeat colonoscopy in 1 year Filed Weights   11/08/23 0955  Weight: 261 lb 3.2 oz (118.5 kg)     Past Medical History:  Diagnosis Date   Allergy    Phreesia 03/01/2020   Arthritis    Phreesia 03/01/2020   Cataract    Phreesia 03/01/2020   Chest pain    Diagnostic cardiac cath October 2012. Normal coronary arteries, left ventricular function   COPD (chronic obstructive pulmonary disease) (HCC)    Phreesia 03/01/2020   DDD (degenerative disc disease), lumbar    Diabetes mellitus    Diabetes mellitus without complication (HCC)    Phreesia 03/01/2020   Heart murmur    History of back surgery    History of neck surgery    HLD (hyperlipidemia) 12/09/2012   Hyperlipidemia    Phreesia 03/01/2020   Hypertension    Obesity    Right knee injury    Sleep apnea    Smoker     Past Surgical History:  Procedure Laterality Date   APPENDECTOMY     BACK SURGERY     for ruptured disc lumbar area   BIOPSY  06/01/2023   Procedure: BIOPSY;  Surgeon: Urban Garden, MD;  Location: [facility];  Service: Gastroenterology;;   CARDIAC CATHETERIZATION  Oct 2012   nml coronary arteries, aorta, LV fcn   COLONOSCOPY WITH PROPOFOL  N/A 01/31/2023   Procedure: COLONOSCOPY WITH PROPOFOL ;  Surgeon: Urban Garden, MD;  Location: [facility];  Service: Gastroenterology;  Laterality: N/A;  12:45pm;ASA 1-2   ESOPHAGOGASTRODUODENOSCOPY  (EGD) WITH PROPOFOL  N/A 06/01/2023   Procedure: ESOPHAGOGASTRODUODENOSCOPY (EGD) WITH PROPOFOL ;  Surgeon: Urban Garden, MD;  [facility]: [facility];  Service: Gastroenterology;  Laterality: N/A;  9:15AM;ASA 3   EYE SURGERY N/A    Phreesia 03/01/2020   HEMOSTASIS CLIP PLACEMENT  01/31/2023   Procedure: HEMOSTASIS CLIP PLACEMENT;  Surgeon: Urban Garden, MD;  [facility]: [facility];  Service: Gastroenterology;;   I & D EXTREMITY Right 02/06/2013   Procedure: IRRIGATION AND DEBRIDEMENT INDEX FINGER;  Surgeon: Milagros Alf, MD;  Location: [facility];  Service: Orthopedics;  Laterality: Right;   IR ANGIOGRAM SELECTIVE EACH ADDITIONAL VESSEL  08/28/2023   IR ANGIOGRAM SELECTIVE EACH ADDITIONAL VESSEL  08/28/2023   IR EMBO ART  VEN HEMORR LYMPH EXTRAV  INC GUIDE ROADMAPPING  08/28/2023   IR EMBO ART  VEN HEMORR LYMPH EXTRAV  INC GUIDE ROADMAPPING  08/28/2023   IR EMBO VENOUS NOT HEMORR HEMANG  INC GUIDE ROADMAPPING  08/28/2023   IR INTRAVASCULAR ULTRASOUND NON CORONARY  08/28/2023   IR RADIOLOGIST EVAL & MGMT  06/20/2023   IR RADIOLOGIST EVAL & MGMT  08/10/2023   IR RADIOLOGIST EVAL & MGMT  09/24/2023   IR TIPS  08/28/2023   IR US  GUIDE VASC ACCESS RIGHT  08/28/2023   IR US  GUIDE VASC ACCESS RIGHT  08/28/2023   NECK SURGERY     for ruptured disc   POLYPECTOMY  01/31/2023   Procedure: POLYPECTOMY;  Surgeon: Urban Garden, MD;  [facility]: [facility];  Service: Gastroenterology;;   SPINE SURGERY N/A    Phreesia 03/01/2020   TIPS PROCEDURE N/A 08/28/2023   Procedure: TRANS-JUGULAR INTRAHEPATIC PORTAL SHUNT (TIPS);  Surgeon: Federico Hopkins, MD;  Location: [facility] OR;  Service: Radiology;  Laterality: N/A;   TONSILLECTOMY      Current Outpatient Medications  Medication Sig Dispense Refill   acetaminophen  (TYLENOL ) 500 MG tablet Take 1,000 mg by mouth every 8 (eight) hours as needed for moderate pain.     carvedilol  (COREG ) 3.125 MG tablet Take 1 tablet (3.125 mg total) by mouth 2 (two)  times daily with a meal. 180 tablet 3   Clobetasol Propionate 0.05 % lotion Apply 1 Application topically as needed (psoriasis).     ezetimibe  (ZETIA ) 10 MG tablet TAKE 1 TABLET EVERY DAY 90 tablet 3   lactulose  (CHRONULAC ) 10 GM/15ML solution Take 30 mLs (20 g total) by mouth 3 (three) times daily. 2700 mL 0   omeprazole  (PRILOSEC) 40 MG capsule Take 1 capsule (40 mg total) by mouth daily. 90 capsule 3   rifaximin  (XIFAXAN ) 550 MG TABS tablet Take 1 tablet (550 mg total) by mouth 2 (two) times daily. 60 tablet 6   vitamin E  180 MG (400 UNITS) capsule Take 400 Units by mouth daily.     zinc  gluconate 50 MG tablet Take 50 mg by mouth daily.     albuterol  (VENTOLIN  HFA) 108 (90 Base) MCG/ACT inhaler Inhale 1-2 puffs into the lungs every 4 (four) hours as needed for wheezing or shortness of breath. (Patient not taking: Reported on 11/08/2023)     ipratropium (ATROVENT ) 0.03 % nasal spray Place 1-2 sprays into both nostrils at bedtime. (Patient not taking: Reported on 11/08/2023)     No current facility-administered medications for this visit.    Allergies as of 11/08/2023 - Review Complete 11/08/2023  Allergen Reaction Noted   Lipitor [atorvastatin calcium] Other (See Comments) 05/02/2011   Sulfa antibiotics Other (See Comments) 05/02/2011   Zocor [simvastatin - high dose] Other (See Comments) 05/02/2011    Social History   Socioeconomic History   Marital status: Married  Spouse name: Kayleen Party   Number of children: 2   Years of education: Not on file   Highest education level: Not on file  Occupational History   Occupation: Disabled    Employer: [employer]  Tobacco Use   Smoking status: Every Day    Current packs/day: 2.00    Types: Cigarettes   Smokeless tobacco: Never   Tobacco comments:    Vaping more then cigs  Vaping Use   Vaping status: Former   Quit date: 07/24/2021  Substance and Sexual Activity   Alcohol use: No   Drug use: No   Sexual activity: Not on file   Other Topics Concern   Not on file  Social History Narrative   1 step-son. 1 daughter.    4 grandchildren.   Social Drivers of Health   Financial Resource Strain: Medium Risk (06/02/2021)   Overall Financial Resource Strain (CARDIA)    Difficulty of Paying Living Expenses: Somewhat hard  Food Insecurity: Patient Declined (08/28/2023)   Hunger Vital Sign    Worried About Running Out of Food in the Last Year: Patient declined    Ran Out of Food in the Last Year: Patient declined  Transportation Needs: No Transportation Needs (08/28/2023)   PRAPARE - Administrator, Civil Service (Medical): No    Lack of Transportation (Non-Medical): No  Physical Activity: Insufficiently Active (06/02/2021)   Exercise Vital Sign    Days of Exercise per Week: 2 days    Minutes of Exercise per Session: 10 min  Stress: Stress Concern Present (06/02/2021)   [employer] of Occupational Health - Occupational Stress Questionnaire    Feeling of Stress : To some extent  Social Connections: Socially Integrated (08/28/2023)   Social Connection and Isolation Panel [NHANES]    Frequency of Communication with Friends and Family: More than three times a week    Frequency of Social Gatherings with Friends and Family: Once a week    Attends Religious Services: 1 to 4 times per year    Active Member of [employer] or Organizations: Yes    Attends Banker Meetings: 1 to 4 times per year    Marital Status: Married    Review of systems General: negative for malaise, night sweats, fever, chills, weight loss Neck: Negative for lumps, goiter, pain and significant neck swelling Resp: Negative for cough, wheezing, dyspnea at rest CV: Negative for chest pain, leg swelling, palpitations, orthopnea GI: denies melena, hematochezia, nausea, vomiting, diarrhea, constipation, dysphagia, odyonophagia, early satiety or unintentional weight loss. +swelling in LLQ  MSK: +swelling to LEs  Derm: Negative for  itching or rash Psych: Denies depression, anxiety,  No homicidal or suicidal ideation. +episodes of memory lapse Heme: Negative for prolonged bleeding, bruising easily, and swollen nodes. Endocrine: Negative for cold or heat intolerance, polyuria, polydipsia and goiter. Neuro: negative for tremor, gait imbalance, syncope and seizures. The remainder of the review of systems is noncontributory.  Physical Exam: BP 122/69 (BP Location: Left Arm, Patient Position: Sitting, Cuff Size: Large)   Pulse 75   Temp (!) 97.1 F (36.2 C) (Temporal)   Ht 6\' 1"  (1.854 m)   Wt 261 lb 3.2 oz (118.5 kg)   BMI 34.46 kg/m  General:   Alert and oriented. No distress noted. Pleasant and cooperative.  Head:  Normocephalic and atraumatic. Eyes:  sclera somewhat icteric  Mouth:  Oral mucosa pink and moist. Good dentition. No lesions. Heart: Normal rate and rhythm, s1 and s2 heart sounds  present.  Lungs: Clear lung sounds in all lobes. Respirations equal and unlabored. Abdomen:  +BS, soft, non-tender and non-distended. No rebound or guarding. No HSM or masses noted. Notably pitting edema to lower abdomen though abdomen is overall soft and non taut Derm: skin appears more jaundiced Msk:  Symmetrical without gross deformities. Normal posture. Extremities:  3+ pitting edema to RLE, 2+ pitting edema to LLE  Neurologic:  Alert and  oriented x4. No asterixis  Psych:  Alert and cooperative. Normal mood and affect.  Invalid input(s): "6 MONTHS"   ASSESSMENT: Ricky Rios is a 73 y.o. male presenting today for follow up of Cirrhosis, s/p TIPS in February   TIPS procedure done in February with confirmed patency earlier this month. He has had some issues with lactulose  compliance which required admission x2 for HE in February, reports compliance with lactulose  at this time but endorses some intermittent memory lapses, having 1-2 BMs per day. He also endorses some swelling to LLQ and Bilateral Extremities which  reveal pitting edema, R worse than left, notably some pitting edema to lower abdomen, though abdomen overall is soft and non taut or distended. Discontinuation of NSBB was deferred to our team by IR, at follow up.  He is alert and oriented in office today without obvious asterixis. He does appear notably somewhat jaudiced but patient and S/O deny changes in skin color. At this time, given intermittent memory issues, will recheck ammonia level, should continue with latulose 30ml TID at this time, will see if we can find out status of xifaxan  which was attempting to be added on by Dr. Maggie Schooner office but patient has not heard anything regarding status of this yet. If ammonia is elevated will need to optimize lactulose  with higher dosing atleast until xifaxan  gets approved. With his new onset edema, will check CMP, as I would  consider low dose lasix /spironolactone  pending renal function and electrolyte balance. Given the findings above, I am hesitant to stop coreg  at this time, will discuss further with Dr. Sammi Crick on timing of potentially stopping his NSBB.    PLAN:  -AFP tumor marker, CMP, ammonia  -continue coreg  for now, will discuss with Dr. Sammi Crick  -continue lactulose  30ml TID compliantly  -check status of Xifaxan  with IR - consider low dose lasix /spironolactone  pending renal function and electrolytes - Reduce salt intake to <2 g per day - Can take Tylenol  max of 2 g per day (650 mg q8h) for pain - Avoid NSAIDs for pain - Avoid eating raw oysters/shellfish - Ensure every night before going to sleep  All questions were answered, patient verbalized understanding and is in agreement with plan as outlined above.    Follow Up: 2 months   Charlise Giovanetti L. Indiyah Paone, MSN, APRN, AGNP-C Adult-Gerontology Nurse Practitioner [facility]  I have reviewed the note and agree with the APP's assessment as described in this progress note  We will hold Coreg  for now as he status post  TIPS, may need to increase diuretics for management of edema.  Hopefully, diuretic requirement will decrease after third spacing and venous return improves with TIPS .  Samantha Cress, MD Gastroenterology and Hepatology [facility] Gastroenterology

## 2023-11-08 NOTE — Telephone Encounter (Signed)
 I spoke with Ricky Lehmann Np there at Dr. Aleen Rios office and she says they are in the process of doing a Prior Authorization on the Xifaxan, but says when she spoke with the patient on Monday the patient had reported that he had not been taking the Lactulose as they had prescribed,and she says she did send over a new script instructing the patient to take the lactulose as prescribed and they are going to wait for Dr. Jinx Rios to return to see if the patient even needs to be on Xifaxan as the patient was not taking the Lactulose as prescribe, he may not even need the Xifaxan.

## 2023-11-12 ENCOUNTER — Other Ambulatory Visit: Payer: Self-pay | Admitting: Student

## 2023-11-12 LAB — COMPREHENSIVE METABOLIC PANEL WITH GFR
AG Ratio: 1.1 (calc) (ref 1.0–2.5)
ALT: 16 U/L (ref 9–46)
AST: 30 U/L (ref 10–35)
Albumin: 3.1 g/dL — ABNORMAL LOW (ref 3.6–5.1)
Alkaline phosphatase (APISO): 136 U/L (ref 35–144)
BUN: 9 mg/dL (ref 7–25)
CO2: 26 mmol/L (ref 20–32)
Calcium: 8.8 mg/dL (ref 8.6–10.3)
Chloride: 108 mmol/L (ref 98–110)
Creat: 0.97 mg/dL (ref 0.70–1.28)
Globulin: 2.7 g/dL (ref 1.9–3.7)
Glucose, Bld: 99 mg/dL (ref 65–99)
Potassium: 3.4 mmol/L — ABNORMAL LOW (ref 3.5–5.3)
Sodium: 141 mmol/L (ref 135–146)
Total Bilirubin: 1.4 mg/dL — ABNORMAL HIGH (ref 0.2–1.2)
Total Protein: 5.8 g/dL — ABNORMAL LOW (ref 6.1–8.1)
eGFR: 83 mL/min/{1.73_m2} (ref >=60)

## 2023-11-12 LAB — AMMONIA: Ammonia: 117 umol/L — ABNORMAL HIGH (ref <=72)

## 2023-11-12 LAB — AFP TUMOR MARKER: AFP-Tumor Marker: 3 ng/mL (ref <6.1)

## 2023-11-12 NOTE — Telephone Encounter (Signed)
 Hello Dr. Jinx Mourning, In regards to this patient, he was seen by you all recently and your office was going to prescribe Xifaxan . I spoke with your NP last week and she says you all had the Prior Auth there,but was waiting for your return to determine if the patient really needed it as he had not been taking his Lactulose  compliantly when you all saw the Patient. Per Gayle Kava, Np here at the office, she says the patient's ammonia levels are elevated and wanted to make sure the patient is started on the Xifaxan . Any update of this would be appreciated. Thanks,

## 2023-11-13 ENCOUNTER — Other Ambulatory Visit (INDEPENDENT_AMBULATORY_CARE_PROVIDER_SITE_OTHER): Payer: Self-pay

## 2023-11-13 ENCOUNTER — Other Ambulatory Visit (INDEPENDENT_AMBULATORY_CARE_PROVIDER_SITE_OTHER): Payer: Self-pay | Admitting: Gastroenterology

## 2023-11-13 DIAGNOSIS — Z95828 Presence of other vascular implants and grafts: Secondary | ICD-10-CM

## 2023-11-13 DIAGNOSIS — K7460 Unspecified cirrhosis of liver: Secondary | ICD-10-CM

## 2023-11-13 DIAGNOSIS — R188 Other ascites: Secondary | ICD-10-CM

## 2023-11-13 DIAGNOSIS — Z79899 Other long term (current) drug therapy: Secondary | ICD-10-CM

## 2023-11-13 DIAGNOSIS — R600 Localized edema: Secondary | ICD-10-CM

## 2023-11-13 MED ORDER — SPIRONOLACTONE 50 MG PO TABS: 50.0000 mg | ORAL_TABLET | Freq: Every day | ORAL | 2 refills | Status: DC

## 2023-11-13 MED ORDER — FUROSEMIDE 20 MG PO TABS: 20.0000 mg | ORAL_TABLET | Freq: Every day | ORAL | 2 refills | Status: DC

## 2023-11-14 NOTE — Progress Notes (Signed)
 Ricky Rios

## 2023-11-19 ENCOUNTER — Telehealth (HOSPITAL_COMMUNITY): Payer: Self-pay | Admitting: Student

## 2023-11-19 NOTE — Telephone Encounter (Signed)
 Patient started taking Rifaximin  11/15/23 and the patient's wife called IR today with concerns about Ricky Rios's right leg hurting. She said he always has lower extremity swelling but now he's got the right leg pain and she was wondering if it was related to the rifaximin . Case discussed with Dr. Jinx Mourning and it is unlikely his leg pain is related to rifaximin . If his leg pain continues he should be evaluated by his PCP. This information was shared with Ricky Rios wife, and she stated he has an appointment with his PCP in early May.   Jetta Morrow, AGACNP-BC 11/19/2023, 2:51 PM

## 2023-11-23 DIAGNOSIS — K7460 Unspecified cirrhosis of liver: Secondary | ICD-10-CM | POA: Diagnosis not present

## 2023-11-23 DIAGNOSIS — E119 Type 2 diabetes mellitus without complications: Secondary | ICD-10-CM | POA: Diagnosis not present

## 2023-11-27 DIAGNOSIS — E785 Hyperlipidemia, unspecified: Secondary | ICD-10-CM | POA: Diagnosis not present

## 2023-11-27 DIAGNOSIS — J988 Other specified respiratory disorders: Secondary | ICD-10-CM | POA: Diagnosis not present

## 2023-11-27 DIAGNOSIS — R609 Edema, unspecified: Secondary | ICD-10-CM | POA: Diagnosis not present

## 2023-11-27 DIAGNOSIS — Z23 Encounter for immunization: Secondary | ICD-10-CM | POA: Diagnosis not present

## 2023-11-27 DIAGNOSIS — L400 Psoriasis vulgaris: Secondary | ICD-10-CM | POA: Diagnosis not present

## 2023-11-27 DIAGNOSIS — E119 Type 2 diabetes mellitus without complications: Secondary | ICD-10-CM | POA: Diagnosis not present

## 2023-11-27 DIAGNOSIS — R600 Localized edema: Secondary | ICD-10-CM | POA: Diagnosis not present

## 2023-11-27 DIAGNOSIS — J449 Chronic obstructive pulmonary disease, unspecified: Secondary | ICD-10-CM | POA: Diagnosis not present

## 2023-11-27 DIAGNOSIS — M7989 Other specified soft tissue disorders: Secondary | ICD-10-CM | POA: Diagnosis not present

## 2023-11-27 DIAGNOSIS — Z0000 Encounter for general adult medical examination without abnormal findings: Secondary | ICD-10-CM | POA: Diagnosis not present

## 2023-11-27 DIAGNOSIS — Z125 Encounter for screening for malignant neoplasm of prostate: Secondary | ICD-10-CM | POA: Diagnosis not present

## 2023-11-27 DIAGNOSIS — K7460 Unspecified cirrhosis of liver: Secondary | ICD-10-CM | POA: Diagnosis not present

## 2023-12-28 ENCOUNTER — Encounter (INDEPENDENT_AMBULATORY_CARE_PROVIDER_SITE_OTHER): Payer: Self-pay | Admitting: *Deleted

## 2023-12-31 ENCOUNTER — Telehealth (HOSPITAL_COMMUNITY): Payer: Self-pay | Admitting: Student

## 2023-12-31 MED ORDER — LACTULOSE 10 GM/15ML PO SOLN: 20.0000 g | Freq: Four times a day (QID) | ORAL | 11 refills | Status: AC

## 2023-12-31 MED ORDER — LACTULOSE 10 GM/15ML PO SOLN: 20.0000 g | Freq: Four times a day (QID) | ORAL | 11 refills | Status: DC

## 2023-12-31 NOTE — Telephone Encounter (Signed)
 Lactulose  refill e-prescribed.  Jaeli Grubb, AGACNP-BC 12/31/2023, 1:23 PM

## 2023-12-31 NOTE — Telephone Encounter (Signed)
 Lactulose  re-prescribed to the correct pharmacy - [facility]. The other prescription was cancelled.    Shloka Baldridge, AGACNP-BC 12/31/2023, 1:45 PM

## 2024-01-21 ENCOUNTER — Ambulatory Visit (INDEPENDENT_AMBULATORY_CARE_PROVIDER_SITE_OTHER): Admitting: Gastroenterology

## 2024-01-21 DIAGNOSIS — I10 Essential (primary) hypertension: Secondary | ICD-10-CM | POA: Diagnosis not present

## 2024-01-21 DIAGNOSIS — I350 Nonrheumatic aortic (valve) stenosis: Secondary | ICD-10-CM | POA: Diagnosis not present

## 2024-01-21 DIAGNOSIS — E119 Type 2 diabetes mellitus without complications: Secondary | ICD-10-CM | POA: Diagnosis not present

## 2024-01-22 DIAGNOSIS — L400 Psoriasis vulgaris: Secondary | ICD-10-CM | POA: Diagnosis not present

## 2024-01-22 DIAGNOSIS — F322 Major depressive disorder, single episode, severe without psychotic features: Secondary | ICD-10-CM | POA: Diagnosis not present

## 2024-01-22 DIAGNOSIS — K7460 Unspecified cirrhosis of liver: Secondary | ICD-10-CM | POA: Diagnosis not present

## 2024-01-30 ENCOUNTER — Telehealth: Payer: Self-pay

## 2024-01-30 NOTE — Telephone Encounter (Signed)
 Phone rang and dropped to busy signal. No VM. Attempted to schedule annual Lung CT. Letter mailed to home.

## 2024-02-01 DIAGNOSIS — I350 Nonrheumatic aortic (valve) stenosis: Secondary | ICD-10-CM | POA: Diagnosis not present

## 2024-02-09 ENCOUNTER — Other Ambulatory Visit (INDEPENDENT_AMBULATORY_CARE_PROVIDER_SITE_OTHER): Payer: Self-pay | Admitting: Gastroenterology

## 2024-02-11 NOTE — Telephone Encounter (Signed)
 I called and left a Vm, asked that the patient please return call to the office. He will need to have his CMP drawn at [facility]. Lab order is already placed.

## 2024-02-12 NOTE — Telephone Encounter (Signed)
 I called the patient again, VM picked up. I left a message asking that the patient please return call to the office.

## 2024-02-13 NOTE — Telephone Encounter (Signed)
 I spoke with the patient and his spouse they are aware we have submitted the medication request,but made aware for further refills he will have to have the CMP that [facility] ordered done. They state understanding.

## 2024-02-21 DIAGNOSIS — E119 Type 2 diabetes mellitus without complications: Secondary | ICD-10-CM | POA: Diagnosis not present

## 2024-02-21 DIAGNOSIS — I10 Essential (primary) hypertension: Secondary | ICD-10-CM | POA: Diagnosis not present

## 2024-02-21 DIAGNOSIS — I350 Nonrheumatic aortic (valve) stenosis: Secondary | ICD-10-CM | POA: Diagnosis not present

## 2024-02-22 DIAGNOSIS — K7460 Unspecified cirrhosis of liver: Secondary | ICD-10-CM | POA: Diagnosis not present

## 2024-02-22 DIAGNOSIS — L400 Psoriasis vulgaris: Secondary | ICD-10-CM | POA: Diagnosis not present

## 2024-02-22 DIAGNOSIS — F322 Major depressive disorder, single episode, severe without psychotic features: Secondary | ICD-10-CM | POA: Diagnosis not present

## 2024-02-25 DIAGNOSIS — K7460 Unspecified cirrhosis of liver: Secondary | ICD-10-CM | POA: Diagnosis not present

## 2024-02-25 DIAGNOSIS — K7581 Nonalcoholic steatohepatitis (NASH): Secondary | ICD-10-CM | POA: Diagnosis not present

## 2024-02-25 DIAGNOSIS — Z5181 Encounter for therapeutic drug level monitoring: Secondary | ICD-10-CM | POA: Diagnosis not present

## 2024-02-25 DIAGNOSIS — Z79899 Other long term (current) drug therapy: Secondary | ICD-10-CM | POA: Diagnosis not present

## 2024-02-26 ENCOUNTER — Ambulatory Visit (INDEPENDENT_AMBULATORY_CARE_PROVIDER_SITE_OTHER): Payer: Self-pay | Admitting: Gastroenterology

## 2024-02-26 ENCOUNTER — Ambulatory Visit: Payer: Self-pay | Admitting: Cardiovascular Disease

## 2024-02-26 LAB — COMPREHENSIVE METABOLIC PANEL WITH GFR
AG Ratio: 1.3 (calc) (ref 1.0–2.5)
ALT: 19 U/L (ref 9–46)
AST: 39 U/L — ABNORMAL HIGH (ref 10–35)
Albumin: 3.3 g/dL — ABNORMAL LOW (ref 3.6–5.1)
Alkaline phosphatase (APISO): 83 U/L (ref 35–144)
BUN: 15 mg/dL (ref 7–25)
CO2: 25 mmol/L (ref 20–32)
Calcium: 9.1 mg/dL (ref 8.6–10.3)
Chloride: 106 mmol/L (ref 98–110)
Creat: 1.12 mg/dL (ref 0.70–1.28)
Globulin: 2.6 g/dL (ref 1.9–3.7)
Glucose, Bld: 87 mg/dL (ref 65–99)
Potassium: 3.8 mmol/L (ref 3.5–5.3)
Sodium: 139 mmol/L (ref 135–146)
Total Bilirubin: 1.7 mg/dL — ABNORMAL HIGH (ref 0.2–1.2)
Total Protein: 5.9 g/dL — ABNORMAL LOW (ref 6.1–8.1)
eGFR: 70 mL/min/1.73m2 (ref >=60)

## 2024-02-29 ENCOUNTER — Encounter (INDEPENDENT_AMBULATORY_CARE_PROVIDER_SITE_OTHER): Payer: Self-pay

## 2024-03-06 ENCOUNTER — Ambulatory Visit (INDEPENDENT_AMBULATORY_CARE_PROVIDER_SITE_OTHER): Admitting: Gastroenterology

## 2024-03-06 ENCOUNTER — Encounter (INDEPENDENT_AMBULATORY_CARE_PROVIDER_SITE_OTHER): Payer: Self-pay | Admitting: Gastroenterology

## 2024-03-06 VITALS — BP 124/71 | HR 74 | Temp 98.2°F | Ht 73.0 in | Wt 254.5 lb

## 2024-03-06 DIAGNOSIS — K7682 Hepatic encephalopathy: Secondary | ICD-10-CM | POA: Diagnosis not present

## 2024-03-06 DIAGNOSIS — K7460 Unspecified cirrhosis of liver: Secondary | ICD-10-CM | POA: Diagnosis not present

## 2024-03-06 DIAGNOSIS — Z860100 Personal history of colon polyps, unspecified: Secondary | ICD-10-CM | POA: Diagnosis not present

## 2024-03-06 DIAGNOSIS — K7581 Nonalcoholic steatohepatitis (NASH): Secondary | ICD-10-CM | POA: Diagnosis not present

## 2024-03-06 NOTE — Progress Notes (Unsigned)
 Referring Provider: Leonel Cole, MD Primary Care Physician:  Leonel Cole, MD Primary GI Physician: Dr. Eartha   Chief Complaint  Patient presents with   Follow-up    Pt arrives for follow up.    HPI:   KOURTLAND COOPMAN is a 73 y.o. 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 Reported he had been taking lactulose  PRN though recently taking it TID, having issues with his memory, 1-2 BMs per day. Had not yet gotten xifaxan  ordered by Dr. Geno office. Ammonia elevated to 97 in February. Endorses LLQ pain, swelling to LE with pitting edema.   Recommended to have updated MELD labs, AFP, ammonia, can stop coreg , continue lactulose  30g TID, xifaxan , consider low dose spironolactone /lasix  pending labs  Labs done 4/17 with sodium 141, potassium 3.4, T bili 1.4, alk phos 136, ast 30, alt 16, AFP 3, ammonia 117   Patient started on aldactone  50mg /lasix  20mg , recommended repeat CMP in 1 week, this was done on 8/4 with stable electrolyte and renal function  Present:  Patient arrives with his wife who reports he is still having intermittent issues with his memory. Seems to be worse in the evenings. She reports he sometimes cannot recall their address or phone numbers, sometimes cannot remember events that happened or timing of appts. Taking xifaxan  BID, lactulose  20mg  QID, BMs fluctuate, can be 1-3 per day, still endorses some constipation at times. His PCP had recommend miralax which seems to have helped some as he sometimes feels constipated, other times may have more diarrhea like stools. No rectal bleeding or melena. Still with some fluid in LEs, with pitting edema. Denies swelling to the abdomen. Appetite is good.    Last MELD 3.0 April: 11 Cirrhosis related questions: Episodes of confusion/disorientation:  as above  Taking diuretics? No  Beta blockers? Coreg  stopped  Prior history of variceal banding?  Prior episodes of SBP? No  Last liver imaging: 08/2023 Some limitation of the intrahepatic portal system. 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 Nodular heterogeneous liver.   There is a patent tips shunt noted but there is significant decrease in velocity along the course of the tips by more than 50%. Significant stenosis is possible. Please correlate with symptomatology. Alcohol ldz:[id number]    Last EGD: 05/2023 - Grade II esophageal varices with no stigmata of                            recent bleeding.                           - Type 2 gastroesophageal varices (GOV2, esophageal                            varices which extend along the fundus), without                            bleeding.                           -  Non-bleeding gastric ulcers with a clean ulcer                            base (Forrest Class III). Biopsied.                           - Normal examined duodenum. Last Colonoscopy: 01/31/2023 - Three 3 to 8 mm polyps in the ascending colon, removed with a cold snare. Resected and retrieved. Clip was placed. Clip manufacturer: [employer]. - One 14 mm polyp in the ascending colon, removed with a hot snare. Resected and retrieved. Clip was placed. Clip manufacturer: [employer]. - Eight 3 to 10 mm polyps in the transverse colon, removed with a cold snare. Resected and retrieved. - Twelve 2 to 10 mm polyps in the rectum, in the sigmoid colon and in the descending colon, removed with a cold snare. Resected and retrieved. - Non- bleeding internal hemorrhoids.   Pathology showed 24 tubular adenomas Recommend a repeat colonoscopy in 1 year  Past Medical History:  Diagnosis Date   Allergy    Phreesia 03/01/2020   Arthritis     Phreesia 03/01/2020   Cataract    Phreesia 03/01/2020   Chest pain    Diagnostic cardiac cath October 2012. Normal coronary arteries, left ventricular function   COPD (chronic obstructive pulmonary disease) (HCC)    Phreesia 03/01/2020   DDD (degenerative disc disease), lumbar    Diabetes mellitus    Diabetes mellitus without complication (HCC)    Phreesia 03/01/2020   Heart murmur    History of back surgery    History of neck surgery    HLD (hyperlipidemia) 12/09/2012   Hyperlipidemia    Phreesia 03/01/2020   Hypertension    Obesity    Right knee injury    Sleep apnea    Smoker     Past Surgical History:  Procedure Laterality Date   APPENDECTOMY     BACK SURGERY     for ruptured disc lumbar area   BIOPSY  06/01/2023   Procedure: BIOPSY;  Surgeon: Eartha Angelia Sieving, MD;  Location: [facility];  Service: Gastroenterology;;   CARDIAC CATHETERIZATION  Oct 2012   nml coronary arteries, aorta, LV fcn   COLONOSCOPY WITH PROPOFOL  N/A 01/31/2023   Procedure: COLONOSCOPY WITH PROPOFOL ;  Surgeon: Eartha Angelia Sieving, MD;  Location: [facility];  Service: Gastroenterology;  Laterality: N/A;  12:45pm;ASA 1-2   ESOPHAGOGASTRODUODENOSCOPY (EGD) WITH PROPOFOL  N/A 06/01/2023   Procedure: ESOPHAGOGASTRODUODENOSCOPY (EGD) WITH PROPOFOL ;  Surgeon: Eartha Angelia Sieving, MD;  Location: [facility];  Service: Gastroenterology;  Laterality: N/A;  9:15AM;ASA 3   EYE SURGERY N/A    Phreesia 03/01/2020   HEMOSTASIS CLIP PLACEMENT  01/31/2023   Procedure: HEMOSTASIS CLIP PLACEMENT;  Surgeon: Eartha Angelia Sieving, MD;  Location: [facility];  Service: Gastroenterology;;   I & D EXTREMITY Right 02/06/2013   Procedure: IRRIGATION AND DEBRIDEMENT INDEX FINGER;  Surgeon: Franky JONELLE Curia, MD;  Location: [facility];  Service: Orthopedics;  Laterality: Right;   IR ANGIOGRAM SELECTIVE EACH ADDITIONAL VESSEL  08/28/2023   IR ANGIOGRAM SELECTIVE EACH ADDITIONAL VESSEL  08/28/2023   IR EMBO ART   VEN HEMORR LYMPH EXTRAV  INC GUIDE ROADMAPPING  08/28/2023   IR EMBO ART  VEN HEMORR LYMPH EXTRAV  INC GUIDE ROADMAPPING  08/28/2023   IR EMBO VENOUS NOT HEMORR HEMANG  INC GUIDE ROADMAPPING  08/28/2023  IR INTRAVASCULAR ULTRASOUND NON CORONARY  08/28/2023   IR RADIOLOGIST EVAL & MGMT  06/20/2023   IR RADIOLOGIST EVAL & MGMT  08/10/2023   IR RADIOLOGIST EVAL & MGMT  09/24/2023   IR TIPS  08/28/2023   IR US  GUIDE VASC ACCESS RIGHT  08/28/2023   IR US  GUIDE VASC ACCESS RIGHT  08/28/2023   NECK SURGERY     for ruptured disc   POLYPECTOMY  01/31/2023   Procedure: POLYPECTOMY;  Surgeon: Eartha Angelia Sieving, MD;  Location: [facility];  Service: Gastroenterology;;   SPINE SURGERY N/A    Phreesia 03/01/2020   TIPS PROCEDURE N/A 08/28/2023   Procedure: TRANS-JUGULAR INTRAHEPATIC PORTAL SHUNT (TIPS);  Surgeon: Jennefer Ester PARAS, MD;  Location: [facility] OR;  Service: Radiology;  Laterality: N/A;   TONSILLECTOMY      Current Outpatient Medications  Medication Sig Dispense Refill   acetaminophen  (TYLENOL ) 500 MG tablet Take 1,000 mg by mouth every 8 (eight) hours as needed for moderate pain.     albuterol  (VENTOLIN  HFA) 108 (90 Base) MCG/ACT inhaler Inhale 1-2 puffs into the lungs every 4 (four) hours as needed for wheezing or shortness of breath. (Patient not taking: Reported on 11/08/2023)     carvedilol  (COREG ) 3.125 MG tablet Take 1 tablet (3.125 mg total) by mouth 2 (two) times daily with a meal. 180 tablet 3   Clobetasol Propionate 0.05 % lotion Apply 1 Application topically as needed (psoriasis).     ezetimibe  (ZETIA ) 10 MG tablet TAKE 1 TABLET EVERY DAY 90 tablet 3   furosemide  (LASIX ) 20 MG tablet Take 1 tablet (20 mg total) by mouth daily. 30 tablet 2   ipratropium (ATROVENT ) 0.03 % nasal spray Place 1-2 sprays into both nostrils at bedtime. (Patient not taking: Reported on 11/08/2023)     lactulose  (CHRONULAC ) 10 GM/15ML solution Take 30 mLs (20 g total) by mouth 4 (four) times daily. 3600 mL 11    omeprazole  (PRILOSEC) 40 MG capsule Take 1 capsule (40 mg total) by mouth daily. 90 capsule 3   rifaximin  (XIFAXAN ) 550 MG TABS tablet Take 1 tablet (550 mg total) by mouth 2 (two) times daily. 60 tablet 6   spironolactone  (ALDACTONE ) 50 MG tablet Take 1 tablet by mouth once daily 30 tablet 1   vitamin E  180 MG (400 UNITS) capsule Take 400 Units by mouth daily.     zinc  gluconate 50 MG tablet Take 50 mg by mouth daily.     No current facility-administered medications for this visit.    Allergies as of 03/06/2024 - Review Complete 11/08/2023  Allergen Reaction Noted   Lipitor [atorvastatin calcium] Other (See Comments) 05/02/2011   Sulfa antibiotics Other (See Comments) 05/02/2011   Zocor [simvastatin - high dose] Other (See Comments) 05/02/2011    Social History   Socioeconomic History   Marital status: Married    Spouse name: Vernell   Number of children: 2   Years of education: Not on file   Highest education level: Not on file  Occupational History   Occupation: Disabled    Employer: [employer]  Tobacco Use   Smoking status: Every Day    Current packs/day: 2.00    Types: Cigarettes   Smokeless tobacco: Never   Tobacco comments:    Vaping more then cigs  Vaping Use   Vaping status: Former   Quit date: 07/24/2021  Substance and Sexual Activity   Alcohol use: No   Drug use: No   Sexual activity: Not on  file  Other Topics Concern   Not on file  Social History Narrative   1 step-son. 1 daughter.    4 grandchildren.   Social Drivers of Health   Financial Resource Strain: Medium Risk (06/02/2021)   Overall Financial Resource Strain (CARDIA)    Difficulty of Paying Living Expenses: Somewhat hard  Food Insecurity: Patient Declined (08/28/2023)   Hunger Vital Sign    Worried About Running Out of Food in the Last Year: Patient declined    Ran Out of Food in the Last Year: Patient declined  Transportation Needs: No Transportation Needs (08/28/2023)   PRAPARE -  Administrator, Civil Service (Medical): No    Lack of Transportation (Non-Medical): No  Physical Activity: Insufficiently Active (06/02/2021)   Exercise Vital Sign    Days of Exercise per Week: 2 days    Minutes of Exercise per Session: 10 min  Stress: Stress Concern Present (06/02/2021)   [employer] of Occupational Health - Occupational Stress Questionnaire    Feeling of Stress : To some extent  Social Connections: Socially Integrated (08/28/2023)   Social Connection and Isolation Panel    Frequency of Communication with Friends and Family: More than three times a week    Frequency of Social Gatherings with Friends and Family: Once a week    Attends Religious Services: 1 to 4 times per year    Active Member of [employer] or Organizations: Yes    Attends Banker Meetings: 1 to 4 times per year    Marital Status: Married    Review of systems General: negative for malaise, night sweats, fever, chills, weight los Neck: Negative for lumps, goiter, pain and significant neck swelling Resp: Negative for cough, wheezing, dyspnea at rest CV: Negative for chest pain, leg swelling, palpitations, orthopnea GI: denies melena, hematochezia, nausea, vomiting, diarrhea, constipation, dysphagia, odyonophagia, early satiety or unintentional weight loss.  MSK: Negative for joint pain or swelling, back pain, and muscle pain. Derm: Negative for itching or rash Psych: Denies depression, anxiety, memory loss, confusion. No homicidal or suicidal ideation.  Heme: Negative for prolonged bleeding, bruising easily, and swollen nodes. Endocrine: Negative for cold or heat intolerance, polyuria, polydipsia and goiter. Neuro: negative for tremor, gait imbalance, syncope and seizures. The remainder of the review of systems is noncontributory.  Physical Exam: There were no vitals taken for this visit. General:   Alert and oriented. No distress noted. Pleasant and cooperative.  Head:   Normocephalic and atraumatic. Eyes:  Conjuctiva clear without scleral icterus. Mouth:  Oral mucosa pink and moist. Good dentition. No lesions. Heart: Normal rate and rhythm, s1 and s2 heart sounds present.  Lungs: Clear lung sounds in all lobes. Respirations equal and unlabored. Abdomen:  +BS, soft, non-tender and non-distended. No rebound or guarding. No HSM or masses noted. Derm: No palmar erythema or jaundice Msk:  Symmetrical without gross deformities. Normal posture. Extremities:  Without edema. Neurologic:  Alert and  oriented x4 Psych:  Alert and cooperative. Normal mood and affect.  Invalid input(s): 6 MONTHS   ASSESSMENT: CHRIST FULLENWIDER is a 73 y.o. male presenting today    PLAN:  -continue lactulose  -xifaxan  550mg  BID -continue lasix  20mg , spironolactone  50mg  daily -can stop coreg  as he is still taking this  -update MELD labs, AFP, ammonia  -US  RUQ for HCC screening - Reduce salt intake to <2 g per day - Can take Tylenol  max of 2 g per day (650 mg q8h) for pain - Avoid NSAIDs  for pain - Avoid eating raw oysters/shellfish - Ensure every night before going to sleep -schedule repeat Colonoscopy  -referral back to genetic counseling  All questions were answered, patient verbalized understanding and is in agreement with plan as outlined above.    Follow Up: 3 months   Makynzee Tigges L. Rayan Dyal, MSN, APRN, AGNP-C Adult-Gerontology Nurse Practitioner [facility]

## 2024-03-06 NOTE — H&P (View-Only) (Signed)
 Referring Provider: Leonel Cole, MD Primary Care Physician:  Leonel Cole, MD Primary GI Physician: Dr. Eartha   Chief Complaint  Patient presents with   Follow-up    Pt arrives for follow up.    HPI:   Ricky Rios is a 73 y.o. 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 Reported he had been taking lactulose  PRN though recently taking it TID, having issues with his memory, 1-2 BMs per day. Had not yet gotten xifaxan  ordered by Dr. Geno office. Ammonia elevated to 97 in February. Endorses LLQ pain, swelling to LE with pitting edema.   Recommended to have updated MELD labs, AFP, ammonia, can stop coreg , continue lactulose  30g TID, xifaxan , consider low dose spironolactone /lasix  pending labs  Labs done 4/17 with sodium 141, potassium 3.4, T bili 1.4, alk phos 136, ast 30, alt 16, AFP 3, ammonia 117   Patient started on aldactone  50mg /lasix  20mg , recommended repeat CMP in 1 week, this was done on 8/4 with stable electrolyte and renal function  Present:  Patient arrives with his wife who reports he is still having intermittent issues with his memory. Seems to be worse in the evenings. She reports he sometimes cannot recall their address or phone numbers, sometimes cannot remember events that happened or timing of appts. Taking xifaxan  BID, lactulose  20g QID, BMs fluctuate, can be 1-3 per day, still endorses some constipation at times. His PCP had recommend miralax which seems to have helped some as he sometimes feels constipated, other times may have more diarrhea like stools. No rectal bleeding or melena. Still with some fluid in LEs, with pitting edema. Denies swelling to the abdomen. Appetite is good.   Last  MELD 3.0 April: 11 Cirrhosis related questions: Episodes of confusion/disorientation:  as above  Taking diuretics? No  Beta blockers? Coreg  stopped  Prior history of variceal banding?  Prior episodes of SBP? No  Last liver imaging: 08/2023 Some limitation of the intrahepatic portal system. 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 Nodular heterogeneous liver.   There is a patent tips shunt noted but there is significant decrease in velocity along the course of the tips by more than 50%. Significant stenosis is possible. Please correlate with symptomatology. Alcohol ldz:[id number]    Last EGD: 05/2023 - Grade II esophageal varices with no stigmata of                            recent bleeding.                           - Type 2 gastroesophageal varices (GOV2, esophageal                            varices which extend along the fundus), without                            bleeding.                           -  Non-bleeding gastric ulcers with a clean ulcer                            base (Forrest Class III). Biopsied.                           - Normal examined duodenum. Last Colonoscopy: 01/31/2023 - Three 3 to 8 mm polyps in the ascending colon, removed with a cold snare. Resected and retrieved. Clip was placed. Clip manufacturer: [employer]. - One 14 mm polyp in the ascending colon, removed with a hot snare. Resected and retrieved. Clip was placed. Clip manufacturer: [employer]. - Eight 3 to 10 mm polyps in the transverse colon, removed with a cold snare. Resected and retrieved. - Twelve 2 to 10 mm polyps in the rectum, in the sigmoid colon and in the descending colon, removed with a cold snare. Resected and retrieved. - Non- bleeding internal hemorrhoids.   Pathology showed 24 tubular adenomas Recommend a repeat colonoscopy in 1 year  Past Medical History:  Diagnosis Date   Allergy    Phreesia 03/01/2020   Arthritis    Phreesia  03/01/2020   Cataract    Phreesia 03/01/2020   Chest pain    Diagnostic cardiac cath October 2012. Normal coronary arteries, left ventricular function   COPD (chronic obstructive pulmonary disease) (HCC)    Phreesia 03/01/2020   DDD (degenerative disc disease), lumbar    Diabetes mellitus    Diabetes mellitus without complication (HCC)    Phreesia 03/01/2020   Heart murmur    History of back surgery    History of neck surgery    HLD (hyperlipidemia) 12/09/2012   Hyperlipidemia    Phreesia 03/01/2020   Hypertension    Obesity    Right knee injury    Sleep apnea    Smoker     Past Surgical History:  Procedure Laterality Date   APPENDECTOMY     BACK SURGERY     for ruptured disc lumbar area   BIOPSY  06/01/2023   Procedure: BIOPSY;  Surgeon: Eartha Angelia Sieving, MD;  [facility]: [facility];  Service: Gastroenterology;;   CARDIAC CATHETERIZATION  Oct 2012   nml coronary arteries, aorta, LV fcn   COLONOSCOPY WITH PROPOFOL  N/A 01/31/2023   Procedure: COLONOSCOPY WITH PROPOFOL ;  Surgeon: Eartha Angelia Sieving, MD;  Location: [facility];  Service: Gastroenterology;  Laterality: N/A;  12:45pm;ASA 1-2   ESOPHAGOGASTRODUODENOSCOPY (EGD) WITH PROPOFOL  N/A 06/01/2023   Procedure: ESOPHAGOGASTRODUODENOSCOPY (EGD) WITH PROPOFOL ;  Surgeon: Eartha Angelia Sieving, MD;  [facility]: [facility];  Service: Gastroenterology;  Laterality: N/A;  9:15AM;ASA 3   EYE SURGERY N/A    Phreesia 03/01/2020   HEMOSTASIS CLIP PLACEMENT  01/31/2023   Procedure: HEMOSTASIS CLIP PLACEMENT;  Surgeon: Eartha Angelia Sieving, MD;  [facility]: [facility];  Service: Gastroenterology;;   I & D EXTREMITY Right 02/06/2013   Procedure: IRRIGATION AND DEBRIDEMENT INDEX FINGER;  Surgeon: Franky JONELLE Curia, MD;  Location: [facility];  Service: Orthopedics;  Laterality: Right;   IR ANGIOGRAM SELECTIVE EACH ADDITIONAL VESSEL  08/28/2023   IR ANGIOGRAM SELECTIVE EACH ADDITIONAL VESSEL  08/28/2023   IR EMBO ART  VEN  HEMORR LYMPH EXTRAV  INC GUIDE ROADMAPPING  08/28/2023   IR EMBO ART  VEN HEMORR LYMPH EXTRAV  INC GUIDE ROADMAPPING  08/28/2023   IR EMBO VENOUS NOT HEMORR HEMANG  INC GUIDE ROADMAPPING  08/28/2023  IR INTRAVASCULAR ULTRASOUND NON CORONARY  08/28/2023   IR RADIOLOGIST EVAL & MGMT  06/20/2023   IR RADIOLOGIST EVAL & MGMT  08/10/2023   IR RADIOLOGIST EVAL & MGMT  09/24/2023   IR TIPS  08/28/2023   IR US  GUIDE VASC ACCESS RIGHT  08/28/2023   IR US  GUIDE VASC ACCESS RIGHT  08/28/2023   NECK SURGERY     for ruptured disc   POLYPECTOMY  01/31/2023   Procedure: POLYPECTOMY;  Surgeon: Eartha Angelia Sieving, MD;  [facility];  Service: Gastroenterology;;   SPINE SURGERY N/A    Phreesia 03/01/2020   TIPS PROCEDURE N/A 08/28/2023   Procedure: TRANS-JUGULAR INTRAHEPATIC PORTAL SHUNT (TIPS);  Surgeon: Jennefer Ester PARAS, MD;  Location: [facility] OR;  Service: Radiology;  Laterality: N/A;   TONSILLECTOMY      Current Outpatient Medications  Medication Sig Dispense Refill   acetaminophen  (TYLENOL ) 500 MG tablet Take 1,000 mg by mouth every 8 (eight) hours as needed for moderate pain.     albuterol  (VENTOLIN  HFA) 108 (90 Base) MCG/ACT inhaler Inhale 1-2 puffs into the lungs every 4 (four) hours as needed for wheezing or shortness of breath. (Patient not taking: Reported on 11/08/2023)     carvedilol  (COREG ) 3.125 MG tablet Take 1 tablet (3.125 mg total) by mouth 2 (two) times daily with a meal. 180 tablet 3   Clobetasol Propionate 0.05 % lotion Apply 1 Application topically as needed (psoriasis).     ezetimibe  (ZETIA ) 10 MG tablet TAKE 1 TABLET EVERY DAY 90 tablet 3   furosemide  (LASIX ) 20 MG tablet Take 1 tablet (20 mg total) by mouth daily. 30 tablet 2   ipratropium (ATROVENT ) 0.03 % nasal spray Place 1-2 sprays into both nostrils at bedtime. (Patient not taking: Reported on 11/08/2023)     lactulose  (CHRONULAC ) 10 GM/15ML solution Take 30 mLs (20 g total) by mouth 4 (four) times daily. 3600 mL 11   omeprazole   (PRILOSEC) 40 MG capsule Take 1 capsule (40 mg total) by mouth daily. 90 capsule 3   rifaximin  (XIFAXAN ) 550 MG TABS tablet Take 1 tablet (550 mg total) by mouth 2 (two) times daily. 60 tablet 6   spironolactone  (ALDACTONE ) 50 MG tablet Take 1 tablet by mouth once daily 30 tablet 1   vitamin E  180 MG (400 UNITS) capsule Take 400 Units by mouth daily.     zinc  gluconate 50 MG tablet Take 50 mg by mouth daily.     No current facility-administered medications for this visit.    Allergies as of 03/06/2024 - Review Complete 11/08/2023  Allergen Reaction Noted   Lipitor [atorvastatin calcium] Other (See Comments) 05/02/2011   Sulfa antibiotics Other (See Comments) 05/02/2011   Zocor [simvastatin - high dose] Other (See Comments) 05/02/2011    Social History   Socioeconomic History   Marital status: Married    Spouse name: Vernell   Number of children: 2   Years of education: Not on file   Highest education level: Not on file  Occupational History   Occupation: Disabled    Employer: [employer]  Tobacco Use   Smoking status: Every Day    Current packs/day: 2.00    Types: Cigarettes   Smokeless tobacco: Never   Tobacco comments:    Vaping more then cigs  Vaping Use   Vaping status: Former   Quit date: 07/24/2021  Substance and Sexual Activity   Alcohol use: No   Drug use: No   Sexual activity: Not on  file  Other Topics Concern   Not on file  Social History Narrative   1 step-son. 1 daughter.    4 grandchildren.   Social Drivers of Health   Financial Resource Strain: Medium Risk (06/02/2021)   Overall Financial Resource Strain (CARDIA)    Difficulty of Paying Living Expenses: Somewhat hard  Food Insecurity: Patient Declined (08/28/2023)   Hunger Vital Sign    Worried About Running Out of Food in the Last Year: Patient declined    Ran Out of Food in the Last Year: Patient declined  Transportation Needs: No Transportation Needs (08/28/2023)   PRAPARE - Therapist, art (Medical): No    Lack of Transportation (Non-Medical): No  Physical Activity: Insufficiently Active (06/02/2021)   Exercise Vital Sign    Days of Exercise per Week: 2 days    Minutes of Exercise per Session: 10 min  Stress: Stress Concern Present (06/02/2021)   [employer] of Occupational Health - Occupational Stress Questionnaire    Feeling of Stress : To some extent  Social Connections: Socially Integrated (08/28/2023)   Social Connection and Isolation Panel    Frequency of Communication with Friends and Family: More than three times a week    Frequency of Social Gatherings with Friends and Family: Once a week    Attends Religious Services: 1 to 4 times per year    Active Member of [employer] or Organizations: Yes    Attends Banker Meetings: 1 to 4 times per year    Marital Status: Married    Review of systems General: negative for malaise, night sweats, fever, chills, weight loss Neck: Negative for lumps, goiter, pain and significant neck swelling Resp: Negative for cough, wheezing, dyspnea at rest CV: Negative for chest pain, leg swelling, palpitations, orthopnea GI: denies melena, hematochezia, nausea, vomiting, diarrhea, constipation, dysphagia, odyonophagia, early satiety or unintentional weight loss. +episodes of confusion (HE?) MSK: Negative for joint pain or swelling, back pain, and muscle pain. Derm: Negative for itching or rash Psych: Denies depression, anxiety, memory loss, confusion. No homicidal or suicidal ideation.  Heme: Negative for prolonged bleeding, bruising easily, and swollen nodes. Endocrine: Negative for cold or heat intolerance, polyuria, polydipsia and goiter. Neuro: negative for tremor, gait imbalance, syncope and seizures. The remainder of the review of systems is noncontributory.  Physical Exam: There were no vitals taken for this visit. General:   Alert and oriented. No distress noted. Pleasant and cooperative.   Head:  Normocephalic and atraumatic. Eyes:  Conjuctiva clear without scleral icterus. Mouth:  Oral mucosa pink and moist. Good dentition. No lesions. Heart: Normal rate and rhythm, s1 and s2 heart sounds present.  Lungs: Clear lung sounds in all lobes. Respirations equal and unlabored. Abdomen:  +BS, soft, non-tender and non-distended. No rebound or guarding. No HSM or masses noted. Derm: No palmar erythema or jaundice Msk:  Symmetrical without gross deformities. Normal posture. Extremities:  1+ pitting edema.  Neurologic:  Alert and  oriented x4, no obvious asterixis on exam  Psych:  Alert and cooperative. Normal mood and affect.  Invalid input(s): 6 MONTHS   ASSESSMENT: Ricky Rios is a 74 y.o. 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 TIPS procedure done in February with confirmed patency back in April. He has had some issues with lactulose  compliance which required admission x2 for HE in February. Patient and wife report compliance with lactulose   and xifaxan  at this time but endorses some intermittent memory lapses, having 1-3 BMs some days, though reports constipation sometimes as well. No asterixis on exam though they report tremors of the hands sometimes at home. He is a/ox4 during our encounter, will continue with current regimen of lactulose  and xifaxan  and check ammonia levels today as we may need to uptitrate his lactulose  further. He is still taking coreg  which can be discontinued due to recent TIPS procedure. No ascites, mild LE edema but seems improved with lose dose lasix  and spironolactone , will continue with these for now. He is due for MELD labs, AFP and RUQ US  for [facility] screening which we will update.   Colonic polyposis: last TCS July 2024, 24 polyps present at that time, patient referred for genetic counseling which he at that time declined to schedule at that  time. We discussed importance of updating colonoscopy and referral back to genetic counseling due to presence of multiple polyps. Patient and wife are in agreement to schedule colonoscopy and be referred to genetic counseling for further evaluation. Indications, risks and benefits of procedure discussed in detail with patient. Patient verbalized understanding and is in agreement to proceed with Colonoscopy.    PLAN:  -continue lactulose  20mg  QID  -xifaxan  550mg  BID -continue lasix  20mg , spironolactone  50mg  daily -can stop coreg  as he is still taking this  -update MELD labs, AFP, ammonia  -US  RUQ for HCC screening - Reduce salt intake to <2 g per day - Can take Tylenol  max of 2 g per day (650 mg q8h) for pain - Avoid NSAIDs for pain - Avoid eating raw oysters/shellfish - Ensure every night before going to sleep -schedule repeat Colonoscopy ASA III  -referral back to genetic counseling  All questions were answered, patient verbalized understanding and is in agreement with plan as outlined above.    Follow Up: 3 months   Jamine Highfill L. Careena Degraffenreid, MSN, APRN, AGNP-C Adult-Gerontology Nurse Practitioner [facility]  I have reviewed the note and agree with the APP's assessment as described in this progress note  Based on most recent evaluation by interventional radiology, the patient was supposed to have CTA abdomen/pelvis BRTO protocol.  Will reach Dr. Terrill office to coordinate this imaging to determine efficacy of TIPS and BRTO procedure.  Toribio Fortune, MD Gastroenterology and Hepatology [facility]

## 2024-03-06 NOTE — Patient Instructions (Addendum)
-  continue lactulose  20g 4 times per day -xifaxan  550mg  twice a day  -continue lasix  20mg , spironolactone  50mg  daily -can stop coreg /carvedilol  -we will update labs and ultrasound - Reduce salt intake to <2 g per day - Can take Tylenol  max of 2 g per day (650 mg q8h) for pain - Avoid NSAIDs for pain - Avoid eating raw oysters/shellfish - Ensure every night before going to sleep -schedule repeat Colonoscopy  -referral back to genetic counseling -I will check ammonia levels and will look at blood counts to ensure these are not contributing but please discuss tremor and ongoing fatigue with your PCP   Follow up 3 months  It was a pleasure to see you today. I want to create trusting relationships with patients and provide genuine, compassionate, and quality care. I truly value your feedback! please be on the lookout for a survey regarding your visit with me today. I appreciate your input about our visit and your time in completing this!    Safire Gordin L. Ausar Georgiou, MSN, APRN, AGNP-C Adult-Gerontology Nurse Practitioner [facility]astroenter[facility] at [facility]

## 2024-03-07 ENCOUNTER — Telehealth: Payer: Self-pay | Admitting: *Deleted

## 2024-03-07 NOTE — Telephone Encounter (Signed)
 Attempted to contact, unable to leave message due to mailbox being full

## 2024-03-10 ENCOUNTER — Ambulatory Visit (INDEPENDENT_AMBULATORY_CARE_PROVIDER_SITE_OTHER): Payer: Self-pay | Admitting: Gastroenterology

## 2024-03-10 LAB — COMPREHENSIVE METABOLIC PANEL WITH GFR
AG Ratio: 1.2 (calc) (ref 1.0–2.5)
ALT: 21 U/L (ref 9–46)
AST: 34 U/L (ref 10–35)
Albumin: 3.2 g/dL — ABNORMAL LOW (ref 3.6–5.1)
Alkaline phosphatase (APISO): 102 U/L (ref 35–144)
BUN: 12 mg/dL (ref 7–25)
CO2: 25 mmol/L (ref 20–32)
Calcium: 9.1 mg/dL (ref 8.6–10.3)
Chloride: 107 mmol/L (ref 98–110)
Creat: 1.17 mg/dL (ref 0.70–1.28)
Globulin: 2.7 g/dL (ref 1.9–3.7)
Glucose, Bld: 93 mg/dL (ref 65–99)
Potassium: 4.3 mmol/L (ref 3.5–5.3)
Sodium: 138 mmol/L (ref 135–146)
Total Bilirubin: 1.5 mg/dL — ABNORMAL HIGH (ref 0.2–1.2)
Total Protein: 5.9 g/dL — ABNORMAL LOW (ref 6.1–8.1)
eGFR: 67 mL/min/1.73m2 (ref >=60)

## 2024-03-10 LAB — AFP TUMOR MARKER: AFP-Tumor Marker: 2.3 ng/mL (ref <6.1)

## 2024-03-10 LAB — CBC
HCT: 33.7 % — ABNORMAL LOW (ref 38.5–50.0)
Hemoglobin: 11.4 g/dL — ABNORMAL LOW (ref 13.2–17.1)
MCH: 32.1 pg (ref 27.0–33.0)
MCHC: 33.8 g/dL (ref 32.0–36.0)
MCV: 94.9 fL (ref 80.0–100.0)
MPV: 9.7 fL (ref 7.5–12.5)
Platelets: 115 Thousand/uL — ABNORMAL LOW (ref 140–400)
RBC: 3.55 Million/uL — ABNORMAL LOW (ref 4.20–5.80)
RDW: 15.4 % — ABNORMAL HIGH (ref 11.0–15.0)
WBC: 6.6 Thousand/uL (ref 3.8–10.8)

## 2024-03-10 LAB — PROTIME-INR
INR: 1.1
Prothrombin Time: 11.9 s — ABNORMAL HIGH (ref 9.0–11.5)

## 2024-03-10 LAB — AMMONIA: Ammonia: 184 umol/L — ABNORMAL HIGH (ref <=72)

## 2024-03-10 NOTE — Telephone Encounter (Signed)
 LMOVM to return call  TCS w/Dr.Castaneda, asa 3

## 2024-03-17 ENCOUNTER — Other Ambulatory Visit: Payer: Self-pay | Admitting: Interventional Radiology

## 2024-03-17 ENCOUNTER — Other Ambulatory Visit: Payer: Self-pay | Admitting: *Deleted

## 2024-03-17 ENCOUNTER — Encounter: Payer: Self-pay | Admitting: *Deleted

## 2024-03-17 DIAGNOSIS — K766 Portal hypertension: Secondary | ICD-10-CM

## 2024-03-17 MED ORDER — PEG 3350-KCL-NA BICARB-NACL 420 G PO SOLR: 4000.0000 mL | Freq: Once | ORAL | 0 refills | Status: AC

## 2024-03-17 NOTE — Telephone Encounter (Signed)
 Pt wife left VM, called back, LMTCB

## 2024-03-17 NOTE — Telephone Encounter (Signed)
 LMOVM to return call.  Pt's wife left vm returning call  US  scheduled for Wednesday 03/26/24, arrive at 10:15 am to check in ,NPO 8 hours prior   TCS w/Dr.Castaneda, asa 3

## 2024-03-17 NOTE — Telephone Encounter (Signed)
 Pt's wife Vernell informed of US  appointment. Pt has been scheduled for 03/27/24, instructions to be picked up and prep sent to pharmacy.

## 2024-03-23 DIAGNOSIS — E119 Type 2 diabetes mellitus without complications: Secondary | ICD-10-CM | POA: Diagnosis not present

## 2024-03-23 DIAGNOSIS — I10 Essential (primary) hypertension: Secondary | ICD-10-CM | POA: Diagnosis not present

## 2024-03-23 DIAGNOSIS — I350 Nonrheumatic aortic (valve) stenosis: Secondary | ICD-10-CM | POA: Diagnosis not present

## 2024-03-25 ENCOUNTER — Telehealth (INDEPENDENT_AMBULATORY_CARE_PROVIDER_SITE_OTHER): Payer: Self-pay | Admitting: Gastroenterology

## 2024-03-25 ENCOUNTER — Encounter (HOSPITAL_COMMUNITY): Payer: Self-pay

## 2024-03-25 ENCOUNTER — Encounter (HOSPITAL_COMMUNITY)
Admission: RE | Admit: 2024-03-25 | Discharge: 2024-03-25 | Disposition: A | Discharge status: Home / Self Care | Source: Ambulatory Visit | Attending: Gastroenterology | Admitting: Gastroenterology

## 2024-03-25 NOTE — Telephone Encounter (Signed)
 Pt wife called back and states she has went through pt medications multiple times and does not have a bottle for the Coreg . Advised pt wife that at last visit Chelsea wanted him to stop Coreg . Pt wife stated she does not have a bottle for it so she much have stopped it for him. Wife states he will not be taking it the day of procedure.

## 2024-03-25 NOTE — Telephone Encounter (Signed)
 Pt wife left voicemail stating that Dr.Hammer wanted him to stay on Spironolactone 

## 2024-03-25 NOTE — Telephone Encounter (Signed)
 Pt wife returned call and states that pt had pre op today and they want him to take the Coreg  before his procedure Thursday.

## 2024-03-25 NOTE — Telephone Encounter (Signed)
 Left message on pt wife voicemail to return call

## 2024-03-25 NOTE — Telephone Encounter (Signed)
 Pt wife contacted and made aware. Pt wife verbalized understanding.

## 2024-03-26 ENCOUNTER — Ambulatory Visit (HOSPITAL_COMMUNITY)
Admission: RE | Admit: 2024-03-26 | Discharge: 2024-03-26 | Disposition: A | Discharge status: Home / Self Care | Source: Ambulatory Visit | Attending: Gastroenterology | Admitting: Gastroenterology

## 2024-03-26 DIAGNOSIS — K8020 Calculus of gallbladder without cholecystitis without obstruction: Secondary | ICD-10-CM | POA: Diagnosis not present

## 2024-03-26 DIAGNOSIS — K7460 Unspecified cirrhosis of liver: Secondary | ICD-10-CM | POA: Insufficient documentation

## 2024-03-26 DIAGNOSIS — K7581 Nonalcoholic steatohepatitis (NASH): Secondary | ICD-10-CM | POA: Insufficient documentation

## 2024-03-26 NOTE — Anesthesia Preprocedure Evaluation (Signed)
 Anesthesia Evaluation  Patient identified by MRN, date of birth, ID band Patient awake    Reviewed: Allergy & Precautions, H&P , NPO status , Patient's Chart, lab work & pertinent test results, reviewed documented beta blocker date and time   Airway Mallampati: II  TM Distance: >3 FB Neck ROM: full    Dental no notable dental hx. (+) Dental Advisory Given, Teeth Intact   Pulmonary sleep apnea , COPD, Current Smoker and Patient abstained from smoking.   Pulmonary exam normal breath sounds clear to auscultation       Cardiovascular hypertension, Normal cardiovascular exam+ Valvular Problems/Murmurs  Rhythm:regular Rate:Normal     Neuro/Psych Neck and back surgery negative neurological ROS  negative psych ROS   GI/Hepatic negative GI ROS,,,(+) Hepatitis -  Endo/Other  diabetes, Type 2    Renal/GU negative Renal ROS  negative genitourinary   Musculoskeletal  (+) Arthritis , Osteoarthritis,    Abdominal   Peds  Hematology negative hematology ROS (+)   Anesthesia Other Findings   Reproductive/Obstetrics negative OB ROS                              Anesthesia Physical Anesthesia Plan  ASA: 3  Anesthesia Plan: General   Post-op Pain Management: Minimal or no pain anticipated   Induction: Intravenous  PONV Risk Score and Plan: Propofol  infusion  Airway Management Planned: Natural Airway and Nasal Cannula  Additional Equipment: None  Intra-op Plan:   Post-operative Plan:   Informed Consent: I have reviewed the patients History and Physical, chart, labs and discussed the procedure including the risks, benefits and alternatives for the proposed anesthesia with the patient or authorized representative who has indicated his/her understanding and acceptance.     Dental Advisory Given  Plan Discussed with: CRNA  Anesthesia Plan Comments:         Anesthesia Quick Evaluation

## 2024-03-27 ENCOUNTER — Ambulatory Visit (HOSPITAL_COMMUNITY): Payer: Self-pay | Admitting: Anesthesiology

## 2024-03-27 ENCOUNTER — Encounter (HOSPITAL_COMMUNITY)
Admission: RE | Disposition: A | Discharge status: Home / Self Care | Payer: Self-pay | Source: Home / Self Care | Attending: Gastroenterology

## 2024-03-27 ENCOUNTER — Other Ambulatory Visit: Payer: Self-pay

## 2024-03-27 ENCOUNTER — Ambulatory Visit (HOSPITAL_COMMUNITY)
Admission: RE | Admit: 2024-03-27 | Discharge: 2024-03-27 | Disposition: A | Discharge status: Home / Self Care | Attending: Gastroenterology | Admitting: Gastroenterology

## 2024-03-27 ENCOUNTER — Encounter (HOSPITAL_COMMUNITY): Payer: Self-pay | Admitting: Gastroenterology

## 2024-03-27 ENCOUNTER — Encounter (INDEPENDENT_AMBULATORY_CARE_PROVIDER_SITE_OTHER): Payer: Self-pay | Admitting: *Deleted

## 2024-03-27 ENCOUNTER — Telehealth (INDEPENDENT_AMBULATORY_CARE_PROVIDER_SITE_OTHER): Payer: Self-pay | Admitting: *Deleted

## 2024-03-27 DIAGNOSIS — I10 Essential (primary) hypertension: Secondary | ICD-10-CM | POA: Diagnosis not present

## 2024-03-27 DIAGNOSIS — I868 Varicose veins of other specified sites: Secondary | ICD-10-CM

## 2024-03-27 DIAGNOSIS — F17200 Nicotine dependence, unspecified, uncomplicated: Secondary | ICD-10-CM | POA: Diagnosis not present

## 2024-03-27 DIAGNOSIS — K648 Other hemorrhoids: Secondary | ICD-10-CM

## 2024-03-27 DIAGNOSIS — D124 Benign neoplasm of descending colon: Secondary | ICD-10-CM | POA: Diagnosis not present

## 2024-03-27 DIAGNOSIS — G4730 Sleep apnea, unspecified: Secondary | ICD-10-CM | POA: Diagnosis not present

## 2024-03-27 DIAGNOSIS — E119 Type 2 diabetes mellitus without complications: Secondary | ICD-10-CM | POA: Insufficient documentation

## 2024-03-27 DIAGNOSIS — Z5986 Financial insecurity: Secondary | ICD-10-CM | POA: Diagnosis not present

## 2024-03-27 DIAGNOSIS — Z860100 Personal history of colon polyps, unspecified: Secondary | ICD-10-CM | POA: Diagnosis not present

## 2024-03-27 DIAGNOSIS — D123 Benign neoplasm of transverse colon: Secondary | ICD-10-CM

## 2024-03-27 DIAGNOSIS — Z1211 Encounter for screening for malignant neoplasm of colon: Secondary | ICD-10-CM | POA: Insufficient documentation

## 2024-03-27 DIAGNOSIS — Z860101 Personal history of adenomatous and serrated colon polyps: Secondary | ICD-10-CM

## 2024-03-27 DIAGNOSIS — F17210 Nicotine dependence, cigarettes, uncomplicated: Secondary | ICD-10-CM | POA: Insufficient documentation

## 2024-03-27 DIAGNOSIS — J449 Chronic obstructive pulmonary disease, unspecified: Secondary | ICD-10-CM | POA: Diagnosis not present

## 2024-03-27 DIAGNOSIS — K635 Polyp of colon: Secondary | ICD-10-CM | POA: Diagnosis not present

## 2024-03-27 HISTORY — PX: COLONOSCOPY: SHX5424

## 2024-03-27 LAB — HM COLONOSCOPY

## 2024-03-27 LAB — GLUCOSE, CAPILLARY: Glucose-Capillary: 82 mg/dL (ref 70–99)

## 2024-03-27 SURGERY — COLONOSCOPY
Anesthesia: General

## 2024-03-27 MED ORDER — LACTATED RINGERS IV SOLN: INTRAVENOUS | Status: DC

## 2024-03-27 MED ORDER — EPHEDRINE SULFATE-NACL 50-0.9 MG/10ML-% IV SOSY
PREFILLED_SYRINGE | INTRAVENOUS | Status: DC | PRN
Start: 2024-03-27 — End: 2024-03-27
  Administered 2024-03-27: 10 mg via INTRAVENOUS

## 2024-03-27 MED ORDER — PROPOFOL 500 MG/50ML IV EMUL
INTRAVENOUS | Status: DC | PRN
  Administered 2024-03-27: 150 ug/kg/min via INTRAVENOUS

## 2024-03-27 MED ORDER — PHENYLEPHRINE 80 MCG/ML (10ML) SYRINGE FOR IV PUSH (FOR BLOOD PRESSURE SUPPORT)
PREFILLED_SYRINGE | INTRAVENOUS | Status: DC | PRN
  Administered 2024-03-27: 80 ug via INTRAVENOUS

## 2024-03-27 MED ORDER — PROPOFOL 10 MG/ML IV BOLUS
INTRAVENOUS | Status: DC | PRN
  Administered 2024-03-27: 50 mg via INTRAVENOUS
  Administered 2024-03-27: 60 mg via INTRAVENOUS

## 2024-03-27 NOTE — Anesthesia Postprocedure Evaluation (Signed)
 Anesthesia Post Note  Patient: Ricky Rios  Procedure(s) Performed: COLONOSCOPY  Patient location during evaluation: Short Stay Anesthesia Type: General Level of consciousness: awake and alert Pain management: pain level controlled Vital Signs Assessment: vitals unstable Respiratory status: spontaneous breathing Cardiovascular status: blood pressure returned to baseline and stable Postop Assessment: no apparent nausea or vomiting Anesthetic complications: no   No notable events documented.   Last Vitals:  Vitals:   03/27/24 0926 03/27/24 0933  BP: (!) 94/31 (!) 108/59  Pulse: 84   Resp: 18   Temp: 36.6 C   SpO2: 100%     Last Pain:  Vitals:   03/27/24 0926  TempSrc: Oral  PainSc: 0-No pain                 Louellen Haldeman

## 2024-03-27 NOTE — Discharge Instructions (Signed)
 You are being discharged to home.  Resume your previous diet.  We are waiting for your pathology results.  Your physician has recommended a repeat colonoscopy in three years for surveillance.  Will refer to genetic counselor given colonic polyposis

## 2024-03-27 NOTE — Telephone Encounter (Signed)
-----   Message from Toribio Fortune Mayorga sent at 03/27/2024 11:04 AM EDT ----- That's a great question! Ricky Rios 09-Aug-1950 Thanks ----- Message ----- From: Blanca Jenkins DEL Sent: 03/27/2024  10:54 AM EDT To: Toribio Fortune Flavors, MD  Who is the patient ----- Message ----- From: Fortune Flavors Toribio, MD Sent: 03/27/2024   9:28 AM EDT To: Jenkins DEL Blanca Erskin Jenkins, can you please refer the patient to genetic counseling? Dx colonic polyposis  Thanks,   Toribio Fortune, MD Gastroenterology and Hepatology [facility]astroenter[facility]

## 2024-03-27 NOTE — Interval H&P Note (Signed)
 History and Physical Interval Note:  03/27/2024 7:32 AM  Ricky Rios  has presented today for surgery, with the diagnosis of history of colon polyps.  The various methods of treatment have been discussed with the patient and family. After consideration of risks, benefits and other options for treatment, the patient has consented to  Procedure(s) with comments: COLONOSCOPY (N/A) - 8:30 am, asa 3 as a surgical intervention.  The patient's history has been reviewed, patient examined, no change in status, stable for surgery.  I have reviewed the patient's chart and labs.  Questions were answered to the patient's satisfaction.     Simonne Boulos Castaneda Mayorga

## 2024-03-27 NOTE — Telephone Encounter (Signed)
 Ricky Rios had me do referral 03/06/24 but it doesn't look like it has been scheduled -- Referral sent again, and they will contact patient with apt

## 2024-03-27 NOTE — Op Note (Signed)
 [facility] Patient Name: Ricky Rios Procedure Date: 03/27/2024 8:25 AM MRN: [id number] Date of Birth: May 10, 1951 Attending MD: Toribio Fortune , , [id number] CSN: [id number] Age: 73 Admit Type: Outpatient Procedure:                Colonoscopy Indications:              Surveillance: History of numerous (> 10) adenomas                            on last colonoscopy (< 3 yrs) Providers:                Toribio Fortune, Harlene Lips, Rosina Sprague Referring MD:              Medicines:                Monitored Anesthesia Care Complications:            No immediate complications. Estimated Blood Loss:     Estimated blood loss: none. Procedure:                Pre-Anesthesia Assessment:                           - Prior to the procedure, a History and Physical                            was performed, and patient medications, allergies                            and sensitivities were reviewed. The patient's                            tolerance of previous anesthesia was reviewed.                           - The risks and benefits of the procedure and the                            sedation options and risks were discussed with the                            patient. All questions were answered and informed                            consent was obtained.                           - ASA Grade Assessment: III - A patient with severe                            systemic disease.                           After obtaining informed consent, the colonoscope                            was passed under direct  vision. Throughout the                            procedure, the patient's blood pressure, pulse, and                            oxygen saturations were monitored continuously. The                            [id number] ([id number]) Peds Colon was introduced                            through the anus and advanced to the the cecum,                            identified by appendiceal  orifice and ileocecal                            valve. The colonoscopy was performed without                            difficulty. The patient tolerated the procedure                            well. The quality of the bowel preparation was good. Scope In: 8:46:37 AM Scope Out: 9:17:09 AM Scope Withdrawal Time: 0 hours 23 minutes 8 seconds  Total Procedure Duration: 0 hours 30 minutes 32 seconds  Findings:      The perianal and digital rectal examinations were normal.      A 5 mm polyp was found in the transverse colon. The polyp was flat. Area       was successfully injected with 1 mL Eleview for a lift polypectomy.       Imaging was performed using white light and narrow band imaging to       visualize the mucosa and demarcate the polyp site after injection for       EMR purposes. The polyp was removed with a cold snare. Resection and       retrieval were complete.      Three sessile polyps were found in the transverse colon. The polyps were       3 to 6 mm in size. These polyps were removed with a cold snare.       Resection and retrieval were complete.      A 5 mm polyp was found in the descending colon. The polyp was sessile.       The polyp was removed with a cold snare. Resection and retrieval were       complete.      Medium sized, non-bleeding rectal varices were found.      Non-bleeding internal hemorrhoids were found during retroflexion. The       hemorrhoids were small. Impression:               - One 5 mm polyp in the transverse colon, removed                            with a cold snare. Resected and retrieved  via EMR.                           - Three 3 to 6 mm polyps in the transverse colon,                            removed with a cold snare. Resected and retrieved.                           - One 5 mm polyp in the descending colon, removed                            with a cold snare. Resected and retrieved.                           - Rectal varices.                            - Non-bleeding internal hemorrhoids. Moderate Sedation:      Per Anesthesia Care Recommendation:           - Discharge patient to home (ambulatory).                           - Resume previous diet.                           - Await pathology results.                           - Repeat colonoscopy in 3 years for surveillance.                           - Will refer to genetic counselor given colonic                            polyposis Procedure Code(s):        --- Professional ---                           [phone], 59, Colonoscopy, flexible; with removal of                            tumor(s), polyp(s), or other lesion(s) by snare                            technique                           45381, Colonoscopy, flexible; with directed                            submucosal injection(s), any substance Diagnosis Code(s):        --- Professional ---                           K64.8, Other hemorrhoids  Z86.010, Personal history of colonic polyps                           D12.3, Benign neoplasm of transverse colon (hepatic                            flexure or splenic flexure)                           D12.4, Benign neoplasm of descending colon CPT copyright 2022 American Medical Association. All rights reserved. The codes documented in this report are preliminary and upon coder review may  be revised to meet current compliance requirements. Toribio Fortune, MD Toribio Fortune,  03/27/2024 9:28:30 AM This report has been signed electronically. Number of Addenda: 0

## 2024-03-27 NOTE — Telephone Encounter (Signed)
 Thank you :)

## 2024-03-27 NOTE — Transfer of Care (Signed)
 Immediate Anesthesia Transfer of Care Note  Patient: Ricky Rios  Procedure(s) Performed: COLONOSCOPY  Patient Location: Short Stay  Anesthesia Type:General  Level of Consciousness: awake  Airway & Oxygen Therapy: Patient Spontanous Breathing  Post-op Assessment: Report given to RN  Post vital signs: Reviewed and stable  Last Vitals:  Vitals Value Taken Time  BP    Temp    Pulse    Resp    SpO2      Last Pain:  Vitals:   03/27/24 0841  TempSrc:   PainSc: 5          Complications: No notable events documented.

## 2024-03-28 ENCOUNTER — Ambulatory Visit: Payer: Self-pay | Admitting: Gastroenterology

## 2024-03-28 LAB — SURGICAL PATHOLOGY

## 2024-03-31 ENCOUNTER — Encounter (INDEPENDENT_AMBULATORY_CARE_PROVIDER_SITE_OTHER): Payer: Self-pay | Admitting: *Deleted

## 2024-03-31 ENCOUNTER — Ambulatory Visit
Admission: RE | Admit: 2024-03-31 | Discharge: 2024-03-31 | Disposition: A | Discharge status: Home / Self Care | Source: Ambulatory Visit | Attending: Interventional Radiology | Admitting: Interventional Radiology

## 2024-03-31 DIAGNOSIS — K766 Portal hypertension: Secondary | ICD-10-CM

## 2024-03-31 DIAGNOSIS — K8020 Calculus of gallbladder without cholecystitis without obstruction: Secondary | ICD-10-CM | POA: Diagnosis not present

## 2024-03-31 MED ORDER — IOPAMIDOL (ISOVUE-370) INJECTION 76%
100.0000 mL | Freq: Once | INTRAVENOUS | Status: AC | PRN
  Administered 2024-03-31: 100 mL via INTRAVENOUS

## 2024-03-31 NOTE — Progress Notes (Signed)
 3 yr TCS noted in recall Patient result letter mailed procedure note and pathology result faxed to PCP

## 2024-04-01 ENCOUNTER — Other Ambulatory Visit

## 2024-04-01 ENCOUNTER — Encounter (HOSPITAL_COMMUNITY): Payer: Self-pay | Admitting: Gastroenterology

## 2024-04-06 NOTE — Progress Notes (Signed)
 Referring Physician(s): Eartha Angelia Sieving   Chief Complaint: The patient is seen in follow up today s/p TIPS 08/28/23  History of present illness:  Ricky Rios, 73 year old male, has a medical history significant for DM, COPD, HTN, sleep apnea, obesity and cirrhosis with portal hypertension. 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 A CTA abdomen/pelvis also revealed a large gastric varix with gastrorenal shunt.   I first met with the patient 05/31/2023 and we discussed in depth the rationale, risks, benefits and alternatives of transvenous obliteration of gastric varices in addition to TIPS creation. We discussed the risk of worsening hepatic encephalopathy. The patient was agreeable to proceed and this was performed 08/28/23. He tolerated the procedure well and was discharged home the next day. He was instructed on post-procedure care including the importance of taking lactulose  as directed. He was followed with regular phone calls post-procedure but he was unfortunately briefly re-hospitalized at the end of February with HE due to lactulose  non-compliance.. Imaging obtained during that hospitalization showed a patent TIPS shunt with a significant decrease in velocity. An area of thrombus was also identified in the left and right intrahepatic portal veins. He was started on 81 mg aspirin.   The patient experienced a few additional bouts of hepatic encephalopathy due to lactulose  non-compliance. He last followed up with me in October 29, 2023 and reported compliance with his medication regimen. He endorsed some mild edema in his legs. He was instructed to being taking rifaximin , stop taking Coreg  and to increase his lactulose .    He recently followed up with his GI team and his wife reported intermittent memory issues as well as persistent lower extremity edema. He had a repeat colonoscopy 03/27/24  which revealed multiple polyps, non-bleeding rectal varices and non-bleeding internal hemorrhoids.   The patient presents today to the IR outpatient clinic for a TIPS follow up. A repeat CTA abdomen/pelvis BRTO protocol was obtained 03/31/24.   He continues on furosemide  40 mg every day and spironolactone  50 mg every day.  He does complain of some persistent bilateral lower extremity edema with associated pain and redness, worse in the mornings.  He had this swelling prior to the TIPS creation.  No hematemesis, blood in stool.  No recent episodes of confusion.  He reports being compliant with lactulose  and rifaximin .    Past Medical History:  Diagnosis Date   Allergy    Phreesia 03/01/2020   Arthritis    Phreesia 03/01/2020   Cataract    Phreesia 03/01/2020   Chest pain    Diagnostic cardiac cath October 2012. Normal coronary arteries, left ventricular function   COPD (chronic obstructive pulmonary disease) (HCC)    Phreesia 03/01/2020   DDD (degenerative disc disease), lumbar    Diabetes mellitus    Diabetes mellitus without complication (HCC)    Phreesia 03/01/2020   Heart murmur    History of back surgery    History of neck surgery    HLD (hyperlipidemia) 12/09/2012   Hyperlipidemia    Phreesia 03/01/2020   Hypertension    Obesity    Right knee injury    Sleep apnea    Smoker     Past Surgical History:  Procedure Laterality Date   APPENDECTOMY     BACK SURGERY     for ruptured disc lumbar area   BIOPSY  06/01/2023   Procedure: BIOPSY;  Surgeon: Eartha Angelia Sieving, MD;  Location: [facility]  END[facility];  Service: Gastroenterology;;   CARDIAC CATHETERIZATION  Oct 2012   nml coronary arteries, aorta, LV fcn   COLONOSCOPY N/A 03/27/2024   Procedure: COLONOSCOPY;  Surgeon: Eartha Angelia Sieving, MD;  Location: [facility];  Service: Gastroenterology;  Laterality: N/A;  8:30 am, asa 3   COLONOSCOPY WITH PROPOFOL  N/A 01/31/2023   Procedure: COLONOSCOPY WITH PROPOFOL ;   Surgeon: Eartha Angelia Sieving, MD;  Location: [facility];  Service: Gastroenterology;  Laterality: N/A;  12:45pm;ASA 1-2   ESOPHAGOGASTRODUODENOSCOPY (EGD) WITH PROPOFOL  N/A 06/01/2023   Procedure: ESOPHAGOGASTRODUODENOSCOPY (EGD) WITH PROPOFOL ;  Surgeon: Eartha Angelia Sieving, MD;  Location: [facility];  Service: Gastroenterology;  Laterality: N/A;  9:15AM;ASA 3   EYE SURGERY N/A    Phreesia 03/01/2020   HEMOSTASIS CLIP PLACEMENT  01/31/2023   Procedure: HEMOSTASIS CLIP PLACEMENT;  Surgeon: Eartha Angelia Sieving, MD;  Location: [facility];  Service: Gastroenterology;;   I & D EXTREMITY Right 02/06/2013   Procedure: IRRIGATION AND DEBRIDEMENT INDEX FINGER;  Surgeon: Franky JONELLE Curia, MD;  Location: [facility] OR;  Service: Orthopedics;  Laterality: Right;   IR ANGIOGRAM SELECTIVE EACH ADDITIONAL VESSEL  08/28/2023   IR ANGIOGRAM SELECTIVE EACH ADDITIONAL VESSEL  08/28/2023   IR EMBO ART  VEN HEMORR LYMPH EXTRAV  INC GUIDE ROADMAPPING  08/28/2023   IR EMBO ART  VEN HEMORR LYMPH EXTRAV  INC GUIDE ROADMAPPING  08/28/2023   IR EMBO VENOUS NOT HEMORR HEMANG  INC GUIDE ROADMAPPING  08/28/2023   IR INTRAVASCULAR ULTRASOUND NON CORONARY  08/28/2023   IR RADIOLOGIST EVAL & MGMT  06/20/2023   IR RADIOLOGIST EVAL & MGMT  08/10/2023   IR RADIOLOGIST EVAL & MGMT  09/24/2023   IR TIPS  08/28/2023   IR US  GUIDE VASC ACCESS RIGHT  08/28/2023   IR US  GUIDE VASC ACCESS RIGHT  08/28/2023   NECK SURGERY     for ruptured disc   POLYPECTOMY  01/31/2023   Procedure: POLYPECTOMY;  Surgeon: Eartha Angelia Sieving, MD;  Location: [facility];  Service: Gastroenterology;;   SPINE SURGERY N/A    Phreesia 03/01/2020   TIPS PROCEDURE N/A 08/28/2023   Procedure: TRANS-JUGULAR INTRAHEPATIC PORTAL SHUNT (TIPS);  Surgeon: Jennefer Ester PARAS, MD;  [facility] OR;  Service: Radiology;  Laterality: N/A;   TONSILLECTOMY      Allergies: Lipitor [atorvastatin calcium], Sulfa antibiotics, and Zocor [simvastatin - high  dose]  Medications: Prior to Admission medications   Medication Sig Start Date End Date Taking? Authorizing Provider  acetaminophen  (TYLENOL ) 500 MG tablet Take 1,000 mg by mouth every 8 (eight) hours as needed for moderate pain.    [provider]  albuterol  (VENTOLIN  HFA) 108 (90 Base) MCG/ACT inhaler Inhale 1-2 puffs into the lungs every 4 (four) hours as needed for wheezing or shortness of breath.    [provider]  carvedilol  (COREG ) 3.125 MG tablet Take 1 tablet (3.125 mg total) by mouth 2 (two) times daily with a meal. 06/01/23   Eartha Angelia, Sieving, MD  Clobetasol Propionate 0.05 % lotion Apply 1 Application topically as needed (psoriasis). 11/24/22   [provider]  ezetimibe  (ZETIA ) 10 MG tablet TAKE 1 TABLET EVERY DAY 08/30/21   Duanne Butler DASEN, MD  furosemide  (LASIX ) 20 MG tablet Take 1 tablet (20 mg total) by mouth daily. 11/13/23 11/12/24  Carlan, Chelsea L, NP  ipratropium (ATROVENT ) 0.03 % nasal spray Place 1-2 sprays into both nostrils at bedtime. 08/16/23   [provider]  lactulose  (CHRONULAC ) 10  GM/15ML solution Take 30 mLs (20 g total) by mouth 4 (four) times daily. 12/31/23 12/30/24  Covington, Jamie R, NP  omeprazole  (PRILOSEC) 40 MG capsule Take 1 capsule (40 mg total) by mouth daily. 06/01/23   Eartha Angelia Sieving, MD  polyethylene glycol (MIRALAX / GLYCOLAX) 17 g packet Take 17 g by mouth 2 (two) times daily.    [provider]  rifaximin  (XIFAXAN ) 550 MG TABS tablet Take 1 tablet (550 mg total) by mouth 2 (two) times daily. 10/30/23 05/27/24  Covington, Jamie R, NP  spironolactone  (ALDACTONE ) 50 MG tablet Take 1 tablet by mouth once daily 02/11/24   Carlan, Chelsea L, NP  vitamin E  180 MG (400 UNITS) capsule Take 400 Units by mouth daily.    [provider]  zinc  gluconate 50 MG tablet Take 50 mg by mouth daily.    [provider]     Family History  Problem Relation Age of Onset   Dementia Mother     Heart disease Father     Social History   Socioeconomic History   Marital status: Married    Spouse name: Vernell   Number of children: 2   Years of education: Not on file   Highest education level: Not on file  Occupational History   Occupation: Disabled    Employer: [employer]  Tobacco Use   Smoking status: Every Day    Current packs/day: 2.00    Types: Cigarettes   Smokeless tobacco: Never   Tobacco comments:    Vaping more then cigs  Vaping Use   Vaping status: Former   Quit date: 07/24/2021  Substance and Sexual Activity   Alcohol use: No   Drug use: No   Sexual activity: Not on file  Other Topics Concern   Not on file  Social History Narrative   1 step-son. 1 daughter.    4 grandchildren.   Social Drivers of Health   Financial Resource Strain: Medium Risk (06/02/2021)   Overall Financial Resource Strain (CARDIA)    Difficulty of Paying Living Expenses: Somewhat hard  Food Insecurity: Patient Declined (08/28/2023)   Hunger Vital Sign    Worried About Running Out of Food in the Last Year: Patient declined    Ran Out of Food in the Last Year: Patient declined  Transportation Needs: No Transportation Needs (08/28/2023)   PRAPARE - Administrator, Civil Service (Medical): No    Lack of Transportation (Non-Medical): No  Physical Activity: Insufficiently Active (06/02/2021)   Exercise Vital Sign    Days of Exercise per Week: 2 days    Minutes of Exercise per Session: 10 min  Stress: Stress Concern Present (06/02/2021)   [employer] of Occupational Health - Occupational Stress Questionnaire    Feeling of Stress : To some extent  Social Connections: Socially Integrated (08/28/2023)   Social Connection and Isolation Panel    Frequency of Communication with Friends and Family: More than three times a week    Frequency of Social Gatherings with Friends and Family: Once a week    Attends Religious Services: 1 to 4 times per year    Active Member of  [employer] or Organizations: Yes    Attends Banker Meetings: 1 to 4 times per year    Marital Status: Married     Vital Signs: There were no vitals taken for this visit.  Physical Exam Patient is alert, oriented and able to participate fully in the conversation. No apparent discomfort  or distress observed. He appears appropriately dressed.    Imaging: CT Chest 02/16/23   Cirrhosis, splenomegaly, suggestion of large gastric varices with gastrorenal shunt.   US  Hepatic Doppler (05/21/23) IMPRESSION: Directed duplex of the hepatic vasculature is unremarkable    CTA AP 07/12/23  Patent portal system    Large gastric varix draining via left renal vein   CT Abdomen/Pelvis 09/20/23        US  Hepatic Duplex 10/26/23 IMPRESSION: Patent TIPS stent with hepatofugal flow noted within the mid and distal portions of the stent.    Echocardiogram 07/16/23 IMPRESSIONS   1. Left ventricular ejection fraction, by estimation, is 60 to 65%. Left  ventricular ejection fraction by 3D volume is 59 %. The left ventricle has  normal function. The left ventricle has no regional wall motion  abnormalities. The left ventricular  internal cavity size was mildly dilated. Left ventricular diastolic  parameters are consistent with Grade I diastolic dysfunction (impaired  relaxation). The average left ventricular global longitudinal strain is  -22.5 %. The global longitudinal strain is  normal.   2. Right ventricular systolic function is low normal. The right  ventricular size is mildly enlarged. There is normal pulmonary artery  systolic pressure. The estimated right ventricular systolic pressure is  28.1 mmHg.   3. Left atrial size was moderately dilated.   4. Right atrial size was moderately dilated.   5. The mitral valve is degenerative. Mild mitral valve regurgitation. No  evidence of mitral stenosis.   6. The aortic valve is calcified. There is moderate calcification of the   aortic valve. There is moderate thickening of the aortic valve. Aortic  valve regurgitation is not visualized. Mild aortic valve stenosis. Aortic  valve area, by VTI measures 1.54  cm. Aortic valve mean gradient measures 18.8 mmHg. Aortic valve Vmax  measures 2.92 m/s.   7. The inferior vena cava is dilated in size with >50% respiratory  variability, suggesting right atrial pressure of 8 mmHg.   Comparison(s): 2022 AV mean gradient 17 mmHg.     CT AP 03/31/24  Patent TIPS. Persistently occluded GEVs.  Labs:  CBC: Recent Labs    09/19/23 1617 09/20/23 0121 09/20/23 0242 03/06/24 0000  WBC 6.2 6.7 6.2 6.6  HGB 12.5* 11.9* 11.7* 11.4*  HCT 36.1* 34.0* 34.1* 33.7*  PLT 133* 100* 100* 115*    COAGS: Recent Labs    08/28/23 1107 08/29/23 0625 09/19/23 1418 03/06/24 0000  INR 1.2 1.3* 1.3* 1.1  APTT  --   --  23*  --     BMP: Recent Labs    08/29/23 0625 09/12/23 1246 09/19/23 1418 09/20/23 0121 11/08/23 1058 02/25/24 1149 03/06/24 0000  NA 135 141 142 140 141 139 138  K 4.2 3.7 4.1 3.4* 3.4* 3.8 4.3  CL 103 109 110 109 108 106 107  CO2 21* 23 22 21* 26 25 25   GLUCOSE 103* 104* 97 94 99 87 93  BUN 10 13 9 9 9 15 12   CALCIUM 9.0 9.2 9.5 9.1 8.8 9.1 9.1  CREATININE 0.88 1.10 1.03 0.95 0.97 1.12 1.17  GFRNONAA >60 >60 >60 >60  --   --   --     LIVER FUNCTION TESTS: Recent Labs    08/29/23 0625 09/12/23 1246 09/19/23 1418 09/20/23 0121 11/08/23 1058 02/25/24 1149 03/06/24 0000  BILITOT 1.1 1.6* 1.8* 2.0* 1.4* 1.7* 1.5*  AST 26 32 41 35 30 39* 34  ALT 17 18 23 20 16  19  21  ALKPHOS 55 79 65 58  --   --   --   PROT 6.2* 7.0 6.9 6.3* 5.8* 5.9* 5.9*  ALBUMIN  3.1* 3.4* 3.6 3.1*  --   --   --     Assessment and Plan:  73 year old male with a history of compensated MASH cirrhosis (CP A6, MELD 7) with portal hypertension 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 On 08/28/23 he underwent TIPS creation with  embolization of the left gastric and short gastric veins as well as coil-assisted retrograde transvenous obliteration of gastrorenal shunt and varices.   He has had no further episodes of encephalopathy, hematochezia, or hematemesis.  He has some lower extremity swelling, but this was present prior to TIPS creation and is unlikely to be third spacing due to portosystemic shunt.  Follow up in 3 months in IR clinic, no imaging necessary.  Ester Sides, MD Pager: [phone]    I spent a total of 25 Minutes in face to face in clinical consultation, greater than 50% of which was counseling/coordinating care for portal hypertension.

## 2024-04-07 ENCOUNTER — Inpatient Hospital Stay
Admission: RE | Admit: 2024-04-07 | Discharge: 2024-04-07 | Disposition: A | Discharge status: Home / Self Care | Source: Ambulatory Visit | Attending: Interventional Radiology

## 2024-04-07 DIAGNOSIS — I8500 Esophageal varices without bleeding: Secondary | ICD-10-CM | POA: Diagnosis not present

## 2024-04-07 DIAGNOSIS — K766 Portal hypertension: Secondary | ICD-10-CM | POA: Diagnosis not present

## 2024-04-07 DIAGNOSIS — J449 Chronic obstructive pulmonary disease, unspecified: Secondary | ICD-10-CM | POA: Diagnosis not present

## 2024-04-07 DIAGNOSIS — E119 Type 2 diabetes mellitus without complications: Secondary | ICD-10-CM | POA: Diagnosis not present

## 2024-04-07 HISTORY — PX: IR RADIOLOGIST EVAL & MGMT: IMG5224

## 2024-04-10 ENCOUNTER — Telehealth: Payer: Self-pay

## 2024-04-10 DIAGNOSIS — E1169 Type 2 diabetes mellitus with other specified complication: Secondary | ICD-10-CM

## 2024-04-11 ENCOUNTER — Telehealth: Payer: Self-pay | Admitting: *Deleted

## 2024-04-11 NOTE — Progress Notes (Signed)
 Complex Care Management Note Care Guide Note  04/11/2024 Name: Ricky Rios MRN: [id number] DOB: [birth year]   Complex Care Management Outreach Attempts: An unsuccessful telephone outreach was attempted today to offer the patient information about available complex care management services.  Follow Up Plan:  Additional outreach attempts will be made to offer the patient complex care management information and services.   Encounter Outcome:  No Answer  [name]  [facility]  [facility], [facility] Guide  Direct Dial: [phone]  Fax [phone]

## 2024-04-11 NOTE — Progress Notes (Signed)
 Complex Care Management Note  Care Guide Note 04/11/2024 Name: Ricky Rios MRN: [id number] DOB: [birth year]  Ricky Rios is a 73 y.o. year old male who sees Leonel Cole, MD for primary care. I reached out to Vinie LITTIE Minus by phone today to offer complex care management services.  Mr. Shives was given information about Complex Care Management services today including:   The Complex Care Management services include support from the care team which includes your Nurse Care Manager, Clinical Social Worker, or Pharmacist.  The Complex Care Management team is here to help remove barriers to the health concerns and goals most important to you. Complex Care Management services are voluntary, and the patient may decline or stop services at any time by request to their care team member.   Complex Care Management Consent Status: Patient agreed to services and verbal consent obtained.   Follow up plan:  Telephone appointment with complex care management team member scheduled for:  04/21/24  Encounter Outcome:  Patient Scheduled  [name]  [facility]  [facility], [facility] Guide  Direct Dial: [phone]  Fax [phone]

## 2024-04-15 ENCOUNTER — Telehealth: Payer: Self-pay

## 2024-04-15 NOTE — Progress Notes (Signed)
 Complex Care Management Note Care Guide Note  04/15/2024 Name: Ricky Rios MRN: [id number] DOB: July 19, 1951   Complex Care Management Outreach Attempts: An unsuccessful telephone outreach was attempted today to offer the patient information about available complex care management services.  Follow Up Plan:  Additional outreach attempts will be made to offer the patient complex care management information and services.   Encounter Outcome:  No Answer  Valleri Hendricksen [facility]  [facility] Guide Direct Dial: [phone]  Fax: [phone] Website: [email or web address]

## 2024-04-21 ENCOUNTER — Other Ambulatory Visit: Payer: Self-pay

## 2024-04-21 ENCOUNTER — Telehealth: Payer: Self-pay

## 2024-04-21 NOTE — Progress Notes (Signed)
 Complex Care Management Note Care Guide Note  04/21/2024 Name: Ricky Rios MRN: [id number] DOB: 1951-05-27  [name] is a 73 y.o. year old male who is a primary care patient of Leonel Cole, MD . The community resource team was consulted for assistance with Transportation Needs   SDOH screenings and interventions completed:  Yes  Social Drivers of Health From This Encounter   Food Insecurity: No Food Insecurity (04/21/2024)   Hunger Vital Sign    Worried About Running Out of Food in the Last Year: Never true    Ran Out of Food in the Last Year: Never true  Housing: Low Risk  (04/21/2024)   Housing Stability Vital Sign    Unable to Pay for Housing in the Last Year: No    Number of Times Moved in the Last Year: 0    Homeless in the Last Year: No  Financial Resource Strain: Patient Declined (04/21/2024)   Overall Financial Resource Strain (CARDIA)    Difficulty of Paying Living Expenses: Patient declined  Transportation Needs: No Transportation Needs (04/21/2024)   PRAPARE - Administrator, Civil Service (Medical): No    Lack of Transportation (Non-Medical): No  Utilities: Not At Risk (04/21/2024)   Utilities    Threatened with loss of utilities: No    SDOH Interventions Today    Flowsheet Row Most Recent Value  SDOH Interventions   Transportation Interventions Other (Comment)  [Patient's wife stated they have already received the transportation information needed. No further assistance needed at this time.]     Care guide performed the following interventions: Patient provided with information about care guide support team and interviewed to confirm resource needs.  Follow Up Plan:  No further follow up planned at this time. The patient has been provided with needed resources.  Encounter Outcome:  Patient Visit Completed  Alanta Scobey [facility]  [facility] Guide Direct Dial: [phone]  Fax:  [phone] Website: [email or web address]

## 2024-04-22 DIAGNOSIS — E119 Type 2 diabetes mellitus without complications: Secondary | ICD-10-CM | POA: Diagnosis not present

## 2024-04-22 DIAGNOSIS — I350 Nonrheumatic aortic (valve) stenosis: Secondary | ICD-10-CM | POA: Diagnosis not present

## 2024-04-22 DIAGNOSIS — I10 Essential (primary) hypertension: Secondary | ICD-10-CM | POA: Diagnosis not present

## 2024-04-22 NOTE — Patient Outreach (Signed)
 Complex Care Management   Visit Note  04/22/2024  Name:  Ricky Rios MRN: [id number] DOB: [birth year]  Situation: Referral received for Complex Care Management related to Heart Failure and Cirrhosis. I obtained verbal consent from patient.  Visit completed with Ricky Rios and wife, Ricky Rios  on the phone.  Patient is being treated for Liver Cirrhosis secondary to NASH, seeing Gastro MD every three months to manage.   Background:   Past Medical History:  Diagnosis Date   Allergy    Phreesia 03/01/2020   Arthritis    [facility]reesia 03/01/2020   Cataract    [facility]reesia 03/01/2020   Chest pain    Diagnostic cardiac cath October 2012. Normal coronary arteries, left ventricular function   COPD (chronic obstructive pulmonary disease) (HCC)    [facility]ia 03/01/2020   DDD (degenerative disc disease), lumbar    Diabetes mellitus    Diabetes mellitus without complication (HCC)    [facility]ia 03/01/2020   Heart murmur    History of back surgery    History of neck surgery    HLD (hyperlipidemia) 12/09/2012   Hyperlipidemia    [facility]ia 03/01/2020   Hypertension    Obesity    Right knee injury    Sleep apnea    Smoker     Assessment: Patient Reported Symptoms:  Cognitive Cognitive Status: Requires Assistance Decision Making, Struggling with memory recall, Normal speech and language skills, Alert and oriented to person, place, and time Cognitive/Intellectual Conditions Management [RPT]: Other Other: Hx of hepatic encephalopathy, has transjugular intrahepatic portosystemic shunt   Health Maintenance Behaviors: Annual physical exam  Neurological Neurological Review of Symptoms: No symptoms reported    HEENT HEENT Symptoms Reported: No symptoms reported HEENT Self-Management Outcome: 5 (very good)    Cardiovascular Cardiovascular Symptoms Reported: Swelling in legs or feet (Has some swelling bilateral lower legs, this has improved.  He is taking furosemide  and spironolactone  as  prescribed to manage.) Cardiovascular Management Strategies: Medication therapy Cardiovascular Self-Management Outcome: 4 (good)  Respiratory Respiratory Symptoms Reported: No symptoms reported Other Respiratory Symptoms: has albuterol  inhaler to take as needed for shortness of breath. Respiratory Self-Management Outcome: 4 (good)  Endocrine Endocrine Symptoms Reported: No symptoms reported Is patient diabetic?: Yes    Gastrointestinal Gastrointestinal Symptoms Reported: No symptoms reported Additional Gastrointestinal Details: Takes lactulose  and Miralax. Bowel Plan: IF after day 2 with no BM, he gets more aggressive with the Miralax, and makes sure he is takinng lactulose  four times a day. Gastrointestinal Management Strategies: Medication therapy Gastrointestinal Self-Management Outcome: 4 (good) Gastrointestinal Comment: He is up to date on Colonoscopy, last was 03/27/24    Genitourinary Genitourinary Symptoms Reported: Urgency    Integumentary Integumentary Symptoms Reported: Other Other Integumentary Symptoms: Dry skin on bilateral LE, discussed cleaning skin daily, patting dry with clean towel, apply non-scented lotion. 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     Quality of Family Relationships: helpful, involved, supportive Do you feel physically threatened by others?: No    04/22/2024    PHQ2-9 Depression Screening   Little interest or pleasure in doing  things Several days  Feeling down, depressed, or hopeless Not at all  PHQ-2 - Total Score 1  Trouble falling or staying asleep, or sleeping too much    Feeling tired or having little energy    Poor appetite or overeating     Feeling bad about yourself - or that you are a failure or have let yourself or your family down    Trouble concentrating on things, such as reading the newspaper or watching television    Moving or speaking so slowly that other people could have noticed.  Or the opposite - being so fidgety or restless that you have been moving around a lot more than usual    Thoughts that you would be better off dead, or hurting yourself in some way    PHQ2-9 Total Score    If you checked off any problems, how difficult have these problems made it for you to do your work, take care of things at home, or get along with other people    Depression Interventions/Treatment      There were no vitals filed for this visit.  Medications Reviewed Today     Reviewed by Ricky Rios LABOR, RN (Registered Nurse) on 04/21/24 at 1011  Med List Status: <None>   Medication Order Taking? Sig Documenting Provider Last Dose Status Informant  acetaminophen  (TYLENOL ) 500 MG tablet [id number] Yes Take 1,000 mg by mouth every 8 (eight) hours as needed for moderate pain. [provider]  Active Spouse/Significant Other, Pharmacy Records  albuterol  (VENTOLIN  HFA) [phone] Base) MCG/ACT inhaler [id number] Yes Inhale 1-2 puffs into the lungs every 4 (four) hours as needed for wheezing or shortness of breath. [provider]  Active Spouse/Significant Other, Pharmacy Records  carvedilol  (COREG ) 3.125 MG tablet [id number]  Take 1 tablet (3.125 mg total) by mouth 2 (two) times daily with a meal.  Patient not taking: Reported on 04/21/2024   Castaneda Mayorga, Daniel, MD  Active Spouse/Significant Other, Pharmacy Records  Clobetasol Propionate 0.05 % lotion [id number] Yes Apply 1 Application topically as  needed (psoriasis). [provider]  Active Spouse/Significant Other, Pharmacy Records  ezetimibe  (ZETIA ) 10 MG tablet [id number] Yes TAKE 1 TABLET EVERY DAY Duanne Butler DASEN, MD  Active Spouse/Significant Other, Pharmacy Records  furosemide  (LASIX ) 20 MG tablet [id number] Yes Take 1 tablet (20 mg total) by mouth daily. Carlan, Chelsea L, NP  Active   ipratropium (ATROVENT ) 0.03 % nasal spray [id number] Yes Place 1-2 sprays into both nostrils at bedtime. [provider]  Active Spouse/Significant Other, Pharmacy Records  lactulose  (CHRONULAC ) 10 GM/15ML solution [id number] Yes Take 30 mLs (20 g total) by mouth 4 (four) times daily. Covington, Jamie R, NP  Active   omeprazole  (PRILOSEC) 40 MG capsule [id number] Yes Take 1 capsule (40 mg total) by mouth daily. Castaneda Mayorga, Daniel, MD  Active Spouse/Significant Other, Pharmacy Records  polyethylene glycol [facility] / GLYCOLAX) 17 g packet [id number] Yes Take 17 g by mouth 2 (two) times daily. [provider]  Active   rifaximin  (XIFAXAN ) 550 MG TABS tablet [id number] Yes Take 1 tablet (550 mg total) by mouth 2 (two) times daily. Covington, Jamie R, NP  Active   spironolactone  (ALDACTONE ) 50 MG tablet [id number] Yes Take 1 tablet by mouth once daily Carlan, Chelsea L, NP  Active   vitamin E  180 MG (400 UNITS) capsule [id number] Yes Take 400 Units by mouth daily. [provider]  Active Spouse/Significant Other, Pharmacy Records  zinc  gluconate 50 MG tablet [id number] Yes Take 50 mg  by mouth daily. [provider]  Active Spouse/Significant Other, Pharmacy Records            Recommendation:   Discussed upcoming appointments:  Specialty provider follow-up Gastro 05/27/24; Cardiology 07/14/24 Patient will continue taking Lactulose  four times a day as prescribed to maintain ammonia levels.   Follow Up Plan:   Telephone follow-up 05/05/24  Rios Stamp BSN, CCM [place]  [facility] RN Care  Manager Direct Dial: [phone]  Fax: [phone]

## 2024-04-22 NOTE — Patient Instructions (Signed)
 Visit Information  Thank you for taking time to visit with me today. Please don't hesitate to contact me if I can be of assistance to you before our next scheduled appointment.  Our next appointment is by telephone on Monday, October 13th at 2:00pm Please call the care guide team at [phone] if you need to cancel or reschedule your appointment.   Following is a copy of your care plan:   Goals Addressed             This Visit's Progress    [facility] RN Care Plan       Problems:  Chronic Disease Management support and education needs related to Impaired Mobility   Goal: Over the next 3 months the Patient will demonstrate Improved health management independence as evidenced by no falls        Over the next 15 days Patient will demonstrate Improved Health management independence as evidenced by taking lactulose  four times a day as prescribed to decrease risk of ammonia toxicity.   Interventions:   Falls Interventions: Reviewed medications and discussed potential side effects of medications such as dizziness and frequent urination Advised patient of importance of notifying provider of falls Screening for signs and symptoms of depression related to chronic disease state  Assessed social determinant of health barriers Assessed for equipment needs in the home: he has a handicap shower, handicap apartment with no steps, uses cane for ambulation.   Patient Self-Care Activities:  Attend all scheduled provider appointments Call provider office for new concerns or questions  Take medications as prescribed   Identify reason for previous falls, remove risks such as not using chairs with wheels or lock wheels if possible.    Plan:  Telephone follow up appointment with care management team member scheduled for:  two weeks.          VBCI RN Care Plan       Problems:  Chronic Disease Management support and education needs related to CHF and Cirrhosis  Goal: Over the next 3 months the  Patient will demonstrate Improved adherence to prescribed treatment plan for CHF and Cirrhosis as evidenced by no ED visits and no hospitalizations Over the next 30 days the patient will demonstrate Ongoing adherence to prescribed treatment plan for CHF and Cirrhosis as evidenced by taking lactulose , furosmide, and spironolactone  as ordered as shown by medication dispense history.  Interventions:   Heart Failure Interventions: Assessed need for readable accurate scales in home Reviewed role of diuretics in prevention of fluid overload and management of heart failure; Discussed the importance of keeping all appointments with provider Screening for signs and symptoms of depression related to chronic disease state  Assessed social determinant of health barriers   Cirrhosis Evaluation of current treatment plan related to Cirrhosis, self-management and patient's adherence to plan as established by provider. Discussed plans with patient for ongoing care management follow up and provided patient with direct contact information for care management team Reviewed medications with patient and discussed lactulose  and its role in maintaining safe ammonia levels in the body; education provided on symptoms of  high ammonia levels.    Patient Self-Care Activities:  Attend all scheduled provider appointments Call pharmacy for medication refills 3-7 days in advance of running out of medications Take medications as prescribed    Plan:  Telephone follow up appointment with care management team member scheduled for:  two weeks             A reminder to ALL patients/family/friends,  please call the [place]  National Suicide Prevention Lifeline: 424-110-9981 or TTY: (731) 867-4855 TTY 325-152-1213) to talk to a trained counselor if you are experiencing a Mental Health or Behavioral Health Crisis or need someone to talk to.  Patient verbalizes understanding of instructions and care plan provided today and  agrees to view in MyChart. Active MyChart status and patient understanding of how to access instructions and care plan via MyChart confirmed with patient.     Santana Stamp BSN, CCM [place]  [facility] RN Care Manager Direct Dial: [phone]  Fax: [phone]

## 2024-04-24 ENCOUNTER — Other Ambulatory Visit (INDEPENDENT_AMBULATORY_CARE_PROVIDER_SITE_OTHER): Payer: Self-pay | Admitting: Gastroenterology

## 2024-04-26 ENCOUNTER — Other Ambulatory Visit (INDEPENDENT_AMBULATORY_CARE_PROVIDER_SITE_OTHER): Payer: Self-pay | Admitting: Gastroenterology

## 2024-04-28 NOTE — Telephone Encounter (Signed)
 Already filled

## 2024-05-05 ENCOUNTER — Telehealth: Payer: Self-pay

## 2024-05-07 ENCOUNTER — Encounter (INDEPENDENT_AMBULATORY_CARE_PROVIDER_SITE_OTHER): Payer: Self-pay | Admitting: Gastroenterology

## 2024-05-19 ENCOUNTER — Other Ambulatory Visit (INDEPENDENT_AMBULATORY_CARE_PROVIDER_SITE_OTHER): Payer: Self-pay | Admitting: Gastroenterology

## 2024-05-23 DIAGNOSIS — I10 Essential (primary) hypertension: Secondary | ICD-10-CM | POA: Diagnosis not present

## 2024-05-23 DIAGNOSIS — I350 Nonrheumatic aortic (valve) stenosis: Secondary | ICD-10-CM | POA: Diagnosis not present

## 2024-05-23 DIAGNOSIS — E119 Type 2 diabetes mellitus without complications: Secondary | ICD-10-CM | POA: Diagnosis not present

## 2024-05-26 ENCOUNTER — Ambulatory Visit (INDEPENDENT_AMBULATORY_CARE_PROVIDER_SITE_OTHER): Admitting: Gastroenterology

## 2024-05-26 ENCOUNTER — Other Ambulatory Visit: Payer: Self-pay | Admitting: Gastroenterology

## 2024-05-27 ENCOUNTER — Encounter (INDEPENDENT_AMBULATORY_CARE_PROVIDER_SITE_OTHER): Payer: Self-pay | Admitting: Gastroenterology

## 2024-05-27 ENCOUNTER — Ambulatory Visit (INDEPENDENT_AMBULATORY_CARE_PROVIDER_SITE_OTHER): Admitting: Gastroenterology

## 2024-05-27 VITALS — BP 139/73 | HR 62 | Temp 97.2°F | Ht 73.0 in | Wt 252.1 lb

## 2024-05-27 DIAGNOSIS — K7469 Other cirrhosis of liver: Secondary | ICD-10-CM | POA: Diagnosis not present

## 2024-05-27 DIAGNOSIS — K7581 Nonalcoholic steatohepatitis (NASH): Secondary | ICD-10-CM | POA: Diagnosis not present

## 2024-05-27 DIAGNOSIS — K7682 Hepatic encephalopathy: Secondary | ICD-10-CM | POA: Diagnosis not present

## 2024-05-27 NOTE — Progress Notes (Addendum)
 Referring Provider: Leonel Cole, MD Primary Care Physician:  Leonel Cole, MD Primary GI Physician: Dr. Eartha   Chief Complaint  Patient presents with   Follow-up    Pt arrives for follow up. Pt states he has his good days and his bad days but doing ok overall.     HPI:   Ricky Rios is a 73 y.o. male with past medical history of  NASH cirrhosis, COPD, diabetes, hyperlipidemia, hypertension   Patient presenting today for:  Follow up of cirrhosis  Last seen August, at that time having intermittent issues with memory, worse in the evenings. Taking xifaxan  BID, lactulose  20gQID, BMs fluctuate from 1-3 per day, some constipation at times, PCP had started miralax, some fluid in LEs still, no swelling to abdomen  Recommended to continue lactulose  20g QID, xifaxan  550mg  BID, lasix  20mg /spironolactone  50mg , stop coreg , update MELD labs, AFP, ammonia, US  for HCC screening, referral back to genetics counselor, schedule Colonoscopy, refer back to Dr. Jennefer for repeat imaging of TIPS and BRTO   Colonoscopy in September as outlined below  Labs in August: ammonia 184 --continue xifaxan , increase lactulose  to 45ml QID AFP 2.3 CMP with total protein 5.9, sodium 138 potassium 4.3, t bili 1.5, AST 34 ALT 21  CBC with Hgb 11.4 plt 115k MELD 3.0 11  RUQ US  03/26/24  Cirrhotic liver morphology without focal hepatic lesion. Moderate cholelithiasis.  CT Angio Abd/Pelvis BRTO on 03/31/24: Postprocedural changes after TIPS creation with retrograde transvenous obliteration of gastro renal shunt and coil embolization of the left gastric vein. No complicating features. 1. Morphologic changes of hepatic cirrhosis without evidence of hepatoma. No ascites. 2. Cholelithiasis.  Seen by Dr. Jennefer in September, pt c/o ongoing LE edema but this was felt unlikely third spacing duet o portosystemic shunt given presence of edema prior to TIPS, recommended follow up in 3 months  Present:  Wife  accompanies patient today during encounter, reports she feels patient's memory is not great, patient states that he has ups and downs with being forgetful, he will have issues remembering dates or remembering where he put items in the house. Having on average 1 BM per day, he is taking 30mls BID of lactulose  (notes he skips doses sometimes when going out, he is taking xifaxan  BID. Takes miralax only on occasion. Feels that swelling in his legs has improved, currently taking lasix  20mg  and spironolactone  50mg  daily. No rectal bleeding or melena. Weight has remained stable. Appetite is good. States Is no longer taking coreg , wife states they stopped that after last visit.   He has not seen genetics counselor yet, they have called but patient's wife states they have had a lot going on and have not scheduled this yet.  Previous MELD 3.0 11 in August   Cirrhosis related questions: Episodes of confusion/disorientation: yes, as above Taking diuretics? As above, lasix  20mg  and spironolactone  50mg  Beta blockers? Stopped coreg  due to recent TIPS  Prior history of variceal banding? no Prior episodes of SBP? no Last liver imaging: September  Alcohol use: no   Last Colonoscopy: 03/2024- One 5 mm polyp in the transverse colon, removed                            with a cold snare. Resected and retrieved via EMR.                           -  Three 3 to 6 mm polyps in the transverse colon,                            removed with a cold snare. Resected and retrieved.                           - One 5 mm polyp in the descending colon, removed                            with a cold snare. Resected and retrieved.                           - Rectal varices.                           - Non-bleeding internal hemorrhoids. A. TRANSVERSE COLON, POLYPECTOMY:  Tubular adenoma, 3 fragments  Negative for high-grade dysplasia and carcinoma   B. DESCENDING COLON, POLYPECTOMY:  Tubular adenoma  Negative for high-grade  dysplasia and carcinoma  Last Endoscopy:05/2023 - Grade II esophageal varices with no stigmata of                            recent bleeding.                           - Type 2 gastroesophageal varices (GOV2, esophageal                            varices which extend along the fundus), without                            bleeding.                           - Non-bleeding gastric ulcers with a clean ulcer                            base (Forrest Class III). Biopsied.                           - Normal examined duodenum.  Repeat TCS in 3 years  Past Medical History:  Diagnosis Date   Allergy    Phreesia 03/01/2020   Arthritis    Phreesia 03/01/2020   Cataract    Phreesia 03/01/2020   Chest pain    Diagnostic cardiac cath October 2012. Normal coronary arteries, left ventricular function   COPD (chronic obstructive pulmonary disease) (HCC)    Phreesia 03/01/2020   DDD (degenerative disc disease), lumbar    Diabetes mellitus    Diabetes mellitus without complication (HCC)    Phreesia 03/01/2020   Heart murmur    History of back surgery    History of neck surgery    HLD (hyperlipidemia) 12/09/2012   Hyperlipidemia    Phreesia 03/01/2020   Hypertension    Obesity    Right knee injury    Sleep apnea    Smoker     Past Surgical History:  Procedure Laterality Date   APPENDECTOMY  BACK SURGERY     for ruptured disc lumbar area   BIOPSY  06/01/2023   Procedure: BIOPSY;  Surgeon: Eartha Angelia Sieving, MD;  Location: [facility];  Service: Gastroenterology;;   CARDIAC CATHETERIZATION  Oct 2012   nml coronary arteries, aorta, LV fcn   COLONOSCOPY N/A 03/27/2024   Procedure: COLONOSCOPY;  Surgeon: Eartha Angelia Sieving, MD;  Location: [facility];  Service: Gastroenterology;  Laterality: N/A;  8:30 am, asa 3   COLONOSCOPY WITH PROPOFOL  N/A 01/31/2023   Procedure: COLONOSCOPY WITH PROPOFOL ;  Surgeon: Eartha Angelia Sieving, MD;  Location: [facility];  Service:  Gastroenterology;  Laterality: N/A;  12:45pm;ASA 1-2   ESOPHAGOGASTRODUODENOSCOPY (EGD) WITH PROPOFOL  N/A 06/01/2023   Procedure: ESOPHAGOGASTRODUODENOSCOPY (EGD) WITH PROPOFOL ;  Surgeon: Eartha Angelia Sieving, MD;  Location: [facility];  Service: Gastroenterology;  Laterality: N/A;  9:15AM;ASA 3   EYE SURGERY N/A    Phreesia 03/01/2020   HEMOSTASIS CLIP PLACEMENT  01/31/2023   Procedure: HEMOSTASIS CLIP PLACEMENT;  Surgeon: Eartha Angelia Sieving, MD;  Location: [facility];  Service: Gastroenterology;;   I & D EXTREMITY Right 02/06/2013   Procedure: IRRIGATION AND DEBRIDEMENT INDEX FINGER;  Surgeon: Franky JONELLE Curia, MD;  Location: [facility];  Service: Orthopedics;  Laterality: Right;   IR ANGIOGRAM SELECTIVE EACH ADDITIONAL VESSEL  08/28/2023   IR ANGIOGRAM SELECTIVE EACH ADDITIONAL VESSEL  08/28/2023   IR EMBO ART  VEN HEMORR LYMPH EXTRAV  INC GUIDE ROADMAPPING  08/28/2023   IR EMBO ART  VEN HEMORR LYMPH EXTRAV  INC GUIDE ROADMAPPING  08/28/2023   IR EMBO VENOUS NOT HEMORR HEMANG  INC GUIDE ROADMAPPING  08/28/2023   IR INTRAVASCULAR ULTRASOUND NON CORONARY  08/28/2023   IR RADIOLOGIST EVAL & MGMT  06/20/2023   IR RADIOLOGIST EVAL & MGMT  08/10/2023   IR RADIOLOGIST EVAL & MGMT  09/24/2023   IR RADIOLOGIST EVAL & MGMT  04/07/2024   IR TIPS  08/28/2023   IR US  GUIDE VASC ACCESS RIGHT  08/28/2023   IR US  GUIDE VASC ACCESS RIGHT  08/28/2023   NECK SURGERY     for ruptured disc   POLYPECTOMY  01/31/2023   Procedure: POLYPECTOMY;  Surgeon: Eartha Angelia Sieving, MD;  Location: [facility];  Service: Gastroenterology;;   SPINE SURGERY N/A    Phreesia 03/01/2020   TIPS PROCEDURE N/A 08/28/2023   Procedure: TRANS-JUGULAR INTRAHEPATIC PORTAL SHUNT (TIPS);  Surgeon: Jennefer Ester PARAS, MD;  Location: [facility];  Service: Radiology;  Laterality: N/A;   TONSILLECTOMY      Current Outpatient Medications  Medication Sig Dispense Refill   acetaminophen  (TYLENOL ) 500 MG tablet Take 1,000 mg by mouth every 8 (eight)  hours as needed for moderate pain.     albuterol  (VENTOLIN  HFA) 108 (90 Base) MCG/ACT inhaler Inhale 1-2 puffs into the lungs every 4 (four) hours as needed for wheezing or shortness of breath.     aspirin EC 81 MG tablet Take 81 mg by mouth daily. Swallow whole.     carvedilol  (COREG ) 3.125 MG tablet Take 1 tablet (3.125 mg total) by mouth 2 (two) times daily with a meal. 180 tablet 3   Clobetasol Propionate 0.05 % lotion Apply 1 Application topically as needed (psoriasis).     escitalopram (LEXAPRO) 10 MG tablet Take 10 mg by mouth daily.     ezetimibe  (ZETIA ) 10 MG tablet TAKE 1 TABLET EVERY DAY 90 tablet 3   furosemide  (LASIX ) 20 MG tablet Take 1 tablet (20 mg total)  by mouth daily. 30 tablet 2   ipratropium (ATROVENT ) 0.03 % nasal spray Place 1-2 sprays into both nostrils at bedtime. (Patient taking differently: Place 1-2 sprays into both nostrils at bedtime. Taking as needed for shortness of breath)     lactulose  (CHRONULAC ) 10 GM/15ML solution Take 30 mLs (20 g total) by mouth 4 (four) times daily. 3600 mL 11   omeprazole  (PRILOSEC) 40 MG capsule Take 1 capsule by mouth once daily 90 capsule 3   polyethylene glycol (MIRALAX / GLYCOLAX) 17 g packet Take 17 g by mouth 2 (two) times daily.     potassium chloride  SA (KLOR-CON  M) 20 MEQ tablet Take 20 mEq by mouth once.     rifaximin  (XIFAXAN ) 550 MG TABS tablet Take 1 tablet (550 mg total) by mouth 2 (two) times daily. 60 tablet 6   spironolactone  (ALDACTONE ) 50 MG tablet Take 1 tablet by mouth once daily 30 tablet 0   vitamin E  180 MG (400 UNITS) capsule Take 400 Units by mouth daily.     zinc  gluconate 50 MG tablet Take 50 mg by mouth daily.     No current facility-administered medications for this visit.    Allergies as of 05/27/2024 - Review Complete 05/27/2024  Allergen Reaction Noted   Lipitor [atorvastatin calcium] Other (See Comments) 05/02/2011   Sulfa antibiotics Other (See Comments) 05/02/2011   Zocor [simvastatin - high dose]  Other (See Comments) 05/02/2011    Social History   Socioeconomic History   Marital status: Married    Spouse name: Vernell   Number of children: 2   Years of education: Not on file   Highest education level: Not on file  Occupational History   Occupation: Disabled    Employer: [employer]  Tobacco Use   Smoking status: Every Day    Current packs/day: 2.00    Types: Cigarettes   Smokeless tobacco: Never   Tobacco comments:    Vaping more then cigs  Vaping Use   Vaping status: Former   Quit date: 07/24/2021  Substance and Sexual Activity   Alcohol use: No   Drug use: No   Sexual activity: Not on file  Other Topics Concern   Not on file  Social History Narrative   1 step-son. 1 daughter.    4 grandchildren.   Social Drivers of Health   Financial Resource Strain: Patient Declined (04/21/2024)   Overall Financial Resource Strain (CARDIA)    Difficulty of Paying Living Expenses: Patient declined  Food Insecurity: No Food Insecurity (04/21/2024)   Hunger Vital Sign    Worried About Running Out of Food in the Last Year: Never true    Ran Out of Food in the Last Year: Never true  Transportation Needs: No Transportation Needs (04/21/2024)   PRAPARE - Administrator, Civil Service (Medical): No    Lack of Transportation (Non-Medical): No  Physical Activity: Insufficiently Active (06/02/2021)   Exercise Vital Sign    Days of Exercise per Week: 2 days    Minutes of Exercise per Session: 10 min  Stress: Stress Concern Present (06/02/2021)   [employer] of Occupational Health - Occupational Stress Questionnaire    Feeling of Stress : To some extent  Social Connections: Socially Integrated (08/28/2023)   Social Connection and Isolation Panel    Frequency of Communication with Friends and Family: More than three times a week    Frequency of Social Gatherings with Friends and Family: Once a week    Attends  Religious Services: 1 to 4 times per year    Active  Member of Clubs or Organizations: Yes    Attends Banker Meetings: 1 to 4 times per year    Marital Status: Married    Review of systems General: negative for malaise, night sweats, fever, chills, weight loss Neck: Negative for lumps, goiter, pain and significant neck swelling Resp: Negative for cough, wheezing, dyspnea at rest CV: Negative for chest pain, leg swelling, palpitations, orthopnea GI: denies melena, hematochezia, nausea, vomiting, diarrhea, constipation, dysphagia, odyonophagia, early satiety or unintentional weight loss.  MSK: Negative for joint pain or swelling, back pain, and muscle pain. Derm: Negative for itching or rash Psych: Denies depression, anxiety, No homicidal or suicidal ideation. +forgetfulness Heme: Negative for prolonged bleeding, bruising easily, and swollen nodes. Endocrine: Negative for cold or heat intolerance, polyuria, polydipsia and goiter. Neuro: negative for tremor, gait imbalance, syncope and seizures. The remainder of the review of systems is noncontributory.  Physical Exam: BP 139/73   Pulse 62   Temp (!) 97.2 F (36.2 C)   Ht 6' 1 (1.854 m)   Wt 252 lb 1.6 oz (114.4 kg)   BMI 33.26 kg/m  General:   Alert and oriented. No distress noted. Pleasant and cooperative.  Head:  Normocephalic and atraumatic. Eyes:  Conjuctiva clear without scleral icterus. Mouth:  Oral mucosa pink and moist. Good dentition. No lesions. Heart: Normal rate and rhythm, s1 and s2 heart sounds present.  Lungs: Clear lung sounds in all lobes. Respirations equal and unlabored. Abdomen:  +BS, soft, non-tender and non-distended. No rebound or guarding. No HSM or masses noted. Derm: No palmar erythema or jaundice Msk:  Symmetrical without gross deformities. Normal posture. Extremities:  Without edema. Neurologic:  Alert and  oriented x4, mild asterixis on exam  Psych:  Alert and cooperative. Normal mood and affect.  Invalid input(s): 6 MONTHS    ASSESSMENT: Ricky Rios is a 73 y.o. 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  TIPS procedure done in February with confirmed patency in september. He has had some issues with lactulose  compliance which required admission x2 for HE in February. Patient and wife report compliance with  xifaxan  at this time but often skips doses of lactulose  when going out and endorses some intermittent memory lapses/forgetfulness, having on average 1 BM per day. Ammonia level was elevated in August, some mild asterixis on exam though He is a/ox4 during our encounter. We discussed the importance of compliance with his lactulose , I encouraged him to take 30ml TID and continue his xifaxan  BID. We also discussed titration of lactulose  to goal of 2-3 soft BMs per day. No ascites, mild LE edema but seems improved with low dose lasix  and spironolactone , will continue with these for now. He is up to date on MELD labs and imaging for [facility] screening until February. Should continue to follow with Dr. Jennefer with IR for previous TIPS.   PLAN:  -up to date on MELD labs/AFP until feb 2026 -up to date on [facility] screening until feb 2026 -continue to follow with Dr. Jennefer as recommended  -continue lasix  20mg /spironolactone  50mg  daily -continue xifaxan  550mg  BID -take lactulose  30ml TID, can titrate for goal os 2-3 soft BMs per day -Reduce salt intake to <2 g per day - Can take Tylenol  max of 2 g per day (650 mg q8h) for pain - Avoid NSAIDs for pain - Avoid eating raw oysters/shellfish - Ensure every  night before going to sleep  All questions were answered, patient verbalized understanding and is in agreement with plan as outlined above.   Follow Up: 3 months   Coalton Arch L. Mariette, MSN, APRN, AGNP-C Adult-Gerontology Nurse Practitioner [facility]  I have reviewed the note and agree with the APP's assessment as described in this progress  note  Toribio Fortune, MD Gastroenterology and Hepatology [facility]

## 2024-05-27 NOTE — Patient Instructions (Signed)
-  up to date on labs and imaging until february -continue to follow with Dr. Jennefer as recommended  -continue lasix  20mg /spironolactone  50mg  daily -continue xifaxan  550mg  twice daily -take lactulose  30ml three times per day, goal is 2-3 soft stools per day, this is important to keep ammonia levels down and help with your forgetfulness - Reduce salt intake to <2 g per day - Can take Tylenol  max of 2 g per day (650 mg q8h) for pain - Avoid NSAIDs for pain - Avoid eating raw oysters/shellfish - Ensure every night before going to sleep  Follow up 3 months  It was a pleasure to see you today. I want to create trusting relationships with patients and provide genuine, compassionate, and quality care. I truly value your feedback! please be on the lookout for a survey regarding your visit with me today. I appreciate your input about our visit and your time in completing this!    Yeimy Brabant L. Talen Poser, MSN, APRN, AGNP-C Adult-Gerontology Nurse Practitioner [facility]

## 2024-06-02 ENCOUNTER — Telehealth: Payer: Self-pay

## 2024-06-06 ENCOUNTER — Telehealth: Payer: Self-pay

## 2024-06-18 ENCOUNTER — Other Ambulatory Visit (INDEPENDENT_AMBULATORY_CARE_PROVIDER_SITE_OTHER): Payer: Self-pay | Admitting: Gastroenterology

## 2024-06-22 DIAGNOSIS — I10 Essential (primary) hypertension: Secondary | ICD-10-CM | POA: Diagnosis not present

## 2024-06-22 DIAGNOSIS — I350 Nonrheumatic aortic (valve) stenosis: Secondary | ICD-10-CM | POA: Diagnosis not present

## 2024-06-22 DIAGNOSIS — E119 Type 2 diabetes mellitus without complications: Secondary | ICD-10-CM | POA: Diagnosis not present

## 2024-07-03 DIAGNOSIS — B355 Tinea imbricata: Secondary | ICD-10-CM | POA: Diagnosis not present

## 2024-07-03 DIAGNOSIS — L218 Other seborrheic dermatitis: Secondary | ICD-10-CM | POA: Diagnosis not present

## 2024-07-11 ENCOUNTER — Other Ambulatory Visit (INDEPENDENT_AMBULATORY_CARE_PROVIDER_SITE_OTHER): Payer: Self-pay | Admitting: Gastroenterology

## 2024-07-14 ENCOUNTER — Ambulatory Visit (HOSPITAL_COMMUNITY)
Admission: RE | Admit: 2024-07-14 | Discharge: 2024-07-14 | Disposition: A | Discharge status: Home / Self Care | Payer: Medicare HMO | Source: Ambulatory Visit | Attending: Surgery | Admitting: Surgery

## 2024-07-14 DIAGNOSIS — F17200 Nicotine dependence, unspecified, uncomplicated: Secondary | ICD-10-CM | POA: Insufficient documentation

## 2024-07-14 DIAGNOSIS — E785 Hyperlipidemia, unspecified: Secondary | ICD-10-CM | POA: Diagnosis not present

## 2024-07-14 DIAGNOSIS — I10 Essential (primary) hypertension: Secondary | ICD-10-CM | POA: Insufficient documentation

## 2024-07-14 DIAGNOSIS — I350 Nonrheumatic aortic (valve) stenosis: Secondary | ICD-10-CM | POA: Insufficient documentation

## 2024-07-16 ENCOUNTER — Ambulatory Visit: Payer: Self-pay | Admitting: Cardiovascular Disease

## 2024-07-16 LAB — ECHOCARDIOGRAM COMPLETE
AR max vel: 1.26 cm2
AV Area VTI: 1.33 cm2
AV Area mean vel: 1.26 cm2
AV Mean grad: 18 mmHg
AV Peak grad: 34.8 mmHg
Ao pk vel: 2.95 m/s
Area-P 1/2: 2.83 cm2
S' Lateral: 3.31 cm

## 2024-07-22 ENCOUNTER — Other Ambulatory Visit: Payer: Self-pay | Admitting: Physician Assistant

## 2024-07-22 MED ORDER — RIFAXIMIN 550 MG PO TABS: 550.0000 mg | ORAL_TABLET | Freq: Two times a day (BID) | ORAL | 1 refills | Status: AC

## 2024-07-25 ENCOUNTER — Other Ambulatory Visit: Payer: Self-pay | Admitting: Interventional Radiology

## 2024-07-25 DIAGNOSIS — Z95828 Presence of other vascular implants and grafts: Secondary | ICD-10-CM

## 2024-08-11 NOTE — Progress Notes (Shared)
 "  Referring Physician(s): Eartha Angelia Sieving   Chief Complaint: The patient is seen in follow up today s/p TIPS 08/28/23   History of present illness: HPI from last clinic visit 04/07/24 Ricky Rios, 74 year old male, has a medical history significant for DM, COPD, HTN, sleep apnea, obesity and cirrhosis with portal hypertension. 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 A CTA abdomen/pelvis also revealed a large gastric varix with gastrorenal shunt.    I first met with the patient 05/31/2023 and we discussed in depth the rationale, risks, benefits and alternatives of transvenous obliteration of gastric varices in addition to TIPS creation. We discussed the risk of worsening hepatic encephalopathy. The patient was agreeable to proceed and this was performed 08/28/23. He tolerated the procedure well and was discharged home the next day. He was instructed on post-procedure care including the importance of taking lactulose  as directed. He was followed with regular phone calls post-procedure but he was unfortunately briefly re-hospitalized at the end of February with HE due to lactulose  non-compliance. Imaging obtained during that hospitalization showed a patent TIPS shunt with a significant decrease in velocity. An area of thrombus was also identified in the left and right intrahepatic portal veins. He was started on 81 mg aspirin.    The patient experienced a few additional bouts of hepatic encephalopathy due to lactulose  non-compliance. He last followed up with me in October 29, 2023 and reported compliance with his medication regimen. He endorsed some mild edema in his legs. He was instructed to being taking rifaximin , stop taking Coreg  and to increase his lactulose .     He recently followed up with his GI team and his wife reported intermittent memory issues as well as persistent lower extremity  edema. He had a repeat colonoscopy 03/27/24 which revealed multiple polyps, non-bleeding rectal varices and non-bleeding internal hemorrhoids.    The patient presents today to the IR outpatient clinic for a TIPS follow up. A repeat CTA abdomen/pelvis BRTO protocol was obtained 03/31/24.    He continues on furosemide  40 mg every day and spironolactone  50 mg every day.  He does complain of some persistent bilateral lower extremity edema with associated pain and redness, worse in the mornings. He had this swelling prior to the TIPS creation.  No hematemesis, blood in stool. No recent episodes of confusion. He reports being compliant with lactulose  and rifaximin .  We discussed a repeat clinic visit in 3 months, no imaging necessary. He presents to the clinic today for follow up. He continues to endorse some non-compliance with lactulose , primarily due to being able to control loose bowel movements during the day and fear of not making it to the bathroom.  He has had intermittent episodes of hepatic encephalopathy.  He continues to endorse some lower extremity swelling.  He has no abdominal pain, no nausea or vomiting, no blood in stool.  He continues to take rifaximin .    Past Medical History:  Diagnosis Date   Allergy    Phreesia 03/01/2020   Arthritis    Phreesia 03/01/2020   Cataract    Phreesia 03/01/2020   Chest pain    Diagnostic cardiac cath October 2012. Normal coronary arteries, left ventricular function   COPD (chronic obstructive pulmonary disease) (HCC)    Phreesia 03/01/2020   DDD (degenerative disc disease), lumbar    Diabetes mellitus    Diabetes mellitus without complication (HCC)    Phreesia 03/01/2020  Heart murmur    History of back surgery    History of neck surgery    HLD (hyperlipidemia) 12/09/2012   Hyperlipidemia    Phreesia 03/01/2020   Hypertension    Obesity    Right knee injury    Sleep apnea    Smoker     Past Surgical History:  Procedure Laterality Date    APPENDECTOMY     BACK SURGERY     for ruptured disc lumbar area   BIOPSY  06/01/2023   Procedure: BIOPSY;  Surgeon: Eartha Angelia Sieving, MD;  Location: [facility];  Service: Gastroenterology;;   CARDIAC CATHETERIZATION  Oct 2012   nml coronary arteries, aorta, LV fcn   COLONOSCOPY N/A 03/27/2024   Procedure: COLONOSCOPY;  Surgeon: Eartha Angelia Sieving, MD;  Location: [facility];  Service: Gastroenterology;  Laterality: N/A;  8:30 am, asa 3   COLONOSCOPY WITH PROPOFOL  N/A 01/31/2023   Procedure: COLONOSCOPY WITH PROPOFOL ;  Surgeon: Eartha Angelia Sieving, MD;  Location: [facility];  Service: Gastroenterology;  Laterality: N/A;  12:45pm;ASA 1-2   ESOPHAGOGASTRODUODENOSCOPY (EGD) WITH PROPOFOL  N/A 06/01/2023   Procedure: ESOPHAGOGASTRODUODENOSCOPY (EGD) WITH PROPOFOL ;  Surgeon: Eartha Angelia Sieving, MD;  Location: [facility];  Service: Gastroenterology;  Laterality: N/A;  9:15AM;ASA 3   EYE SURGERY N/A    Phreesia 03/01/2020   HEMOSTASIS CLIP PLACEMENT  01/31/2023   Procedure: HEMOSTASIS CLIP PLACEMENT;  Surgeon: Eartha Angelia Sieving, MD;  Location: [facility];  Service: Gastroenterology;;   I & D EXTREMITY Right 02/06/2013   Procedure: IRRIGATION AND DEBRIDEMENT INDEX FINGER;  Surgeon: Franky JONELLE Curia, MD;  Location: [facility];  Service: Orthopedics;  Laterality: Right;   IR ANGIOGRAM SELECTIVE EACH ADDITIONAL VESSEL  08/28/2023   IR ANGIOGRAM SELECTIVE EACH ADDITIONAL VESSEL  08/28/2023   IR EMBO ART  VEN HEMORR LYMPH EXTRAV  INC GUIDE ROADMAPPING  08/28/2023   IR EMBO ART  VEN HEMORR LYMPH EXTRAV  INC GUIDE ROADMAPPING  08/28/2023   IR EMBO VENOUS NOT HEMORR HEMANG  INC GUIDE ROADMAPPING  08/28/2023   IR INTRAVASCULAR ULTRASOUND NON CORONARY  08/28/2023   IR RADIOLOGIST EVAL & MGMT  06/20/2023   IR RADIOLOGIST EVAL & MGMT  08/10/2023   IR RADIOLOGIST EVAL & MGMT  09/24/2023   IR RADIOLOGIST EVAL & MGMT  04/07/2024   IR TIPS  08/28/2023   IR US  GUIDE VASC ACCESS RIGHT  08/28/2023    IR US  GUIDE VASC ACCESS RIGHT  08/28/2023   NECK SURGERY     for ruptured disc   POLYPECTOMY  01/31/2023   Procedure: POLYPECTOMY;  Surgeon: Eartha Angelia Sieving, MD;  Location: [facility];  Service: Gastroenterology;;   SPINE SURGERY N/A    Phreesia 03/01/2020   TIPS PROCEDURE N/A 08/28/2023   Procedure: TRANS-JUGULAR INTRAHEPATIC PORTAL SHUNT (TIPS);  Surgeon: Jennefer Ester PARAS, MD;  Location[facility];  Service: Radiology;  Laterality: N/A;   TONSILLECTOMY      Allergies: Lipitor [atorvastatin calcium], Sulfa antibiotics, and Zocor [simvastatin - high dose]  Medications: Prior to Admission medications  Medication Sig Start Date End Date Taking? Authorizing Provider  acetaminophen  (TYLENOL ) 500 MG tablet Take 1,000 mg by mouth every 8 (eight) hours as needed for moderate pain.    [provider]  albuterol  (VENTOLIN  HFA) 108 (90 Base) MCG/ACT inhaler Inhale 1-2 puffs into the lungs every 4 (four) hours as needed for wheezing or shortness of breath.    [provider]  aspirin EC 81 MG  tablet Take 81 mg by mouth daily. Swallow whole.    [provider]  Clobetasol Propionate 0.05 % lotion Apply 1 Application topically as needed (psoriasis). 11/24/22   [provider]  escitalopram (LEXAPRO) 10 MG tablet Take 10 mg by mouth daily.    [provider]  ezetimibe  (ZETIA ) 10 MG tablet TAKE 1 TABLET EVERY DAY 08/30/21   Duanne Butler DASEN, MD  furosemide  (LASIX ) 20 MG tablet Take 1 tablet by mouth once daily 07/11/24   Carlan, Chelsea L, NP  ipratropium (ATROVENT ) 0.03 % nasal spray Place 1-2 sprays into both nostrils at bedtime. Patient taking differently: Place 1-2 sprays into both nostrils at bedtime. Taking as needed for shortness of breath 08/16/23   [provider]  lactulose  (CHRONULAC ) 10 GM/15ML solution Take 30 mLs (20 g total) by mouth 4 (four) times daily. 12/31/23 12/30/24  Covington, Jamie R, NP  omeprazole  (PRILOSEC) 40 MG capsule  Take 1 capsule by mouth once daily 05/26/24   Carlan, Chelsea L, NP  polyethylene glycol (MIRALAX / GLYCOLAX) 17 g packet Take 17 g by mouth 2 (two) times daily.    [provider]  potassium chloride  SA (KLOR-CON  M) 20 MEQ tablet Take 20 mEq by mouth once.    [provider]  rifaximin  (XIFAXAN ) 550 MG TABS tablet Take 1 tablet (550 mg total) by mouth 2 (two) times daily. 07/22/24   Neita Kirsch A, PA-C  spironolactone  (ALDACTONE ) 50 MG tablet Take 1 tablet by mouth once daily 06/18/24   Carlan, Chelsea L, NP  vitamin E  180 MG (400 UNITS) capsule Take 400 Units by mouth daily.    [provider]  zinc  gluconate 50 MG tablet Take 50 mg by mouth daily.    [provider]     Family History  Problem Relation Age of Onset   Dementia Mother    Heart disease Father     Social History   Socioeconomic History   Marital status: Married    Spouse name: Vernell   Number of children: 2   Years of education: Not on file   Highest education level: Not on file  Occupational History   Occupation: Disabled    Employer: [employer]  Tobacco Use   Smoking status: Every Day    Current packs/day: 2.00    Types: Cigarettes   Smokeless tobacco: Never   Tobacco comments:    Vaping more then cigs  Vaping Use   Vaping status: Former   Quit date: 07/24/2021  Substance and Sexual Activity   Alcohol use: No   Drug use: No   Sexual activity: Not on file  Other Topics Concern   Not on file  Social History Narrative   1 step-son. 1 daughter.    4 grandchildren.   Social Drivers of Health   Tobacco Use: High Risk (05/27/2024)   Patient History    Smoking Tobacco Use: Every Day    Smokeless Tobacco Use: Never    Passive Exposure: Not on file  Financial Resource Strain: Patient Declined (04/21/2024)   Overall Financial Resource Strain (CARDIA)    Difficulty of Paying Living Expenses: Patient declined  Food Insecurity: No Food Insecurity (04/21/2024)    Epic    Worried About Programme Researcher, Broadcasting/film/video in the Last Year: Never true    Ran Out of Food in the Last Year: Never true  Transportation Needs: No Transportation Needs (04/21/2024)   Epic    Lack of Transportation (Medical): No  Lack of Transportation (Non-Medical): No  Physical Activity: Not on file  Stress: Not on file  Social Connections: Socially Integrated (08/28/2023)   Social Connection and Isolation Panel    Frequency of Communication with Friends and Family: More than three times a week    Frequency of Social Gatherings with Friends and Family: Once a week    Attends Religious Services: 1 to 4 times per year    Active Member of [employer] or Organizations: Yes    Attends Banker Meetings: 1 to 4 times per year    Marital Status: Married  Depression (PHQ2-9): Low Risk (04/21/2024)   Depression (PHQ2-9)    PHQ-2 Score: 1  Alcohol Screen: Not on file  Housing: Low Risk (04/21/2024)   Epic    Unable to Pay for Housing in the Last Year: No    Number of Times Moved in the Last Year: 0    Homeless in the Last Year: No  Utilities: Not At Risk (04/21/2024)   Epic    Threatened with loss of utilities: No  Health Literacy: Not on file     Vital Signs: There were no vitals taken for this visit.  Physical Exam Constitutional:      General: He is not in acute distress. HENT:     Head: Normocephalic.     Mouth/Throat:     Mouth: Mucous membranes are moist.  Eyes:     General: No scleral icterus. Cardiovascular:     Rate and Rhythm: Normal rate and regular rhythm.  Pulmonary:     Effort: No respiratory distress.  Abdominal:     General: There is no distension.  Musculoskeletal:     Right lower leg: Edema present.     Left lower leg: Edema present.  Skin:    General: Skin is warm and dry.     Coloration: Skin is not jaundiced.  Neurological:     Mental Status: He is alert and oriented to person, place, and time.     Imaging:  CT Chest 02/16/23   Cirrhosis,  splenomegaly, suggestion of large gastric varices with gastrorenal shunt.   US  Hepatic Doppler (05/21/23) IMPRESSION: Directed duplex of the hepatic vasculature is unremarkable    CTA AP 07/12/23  Patent portal system    Large gastric varix draining via left renal vein   CT Abdomen/Pelvis 09/20/23        US  Hepatic Duplex 10/26/23 IMPRESSION: Patent TIPS stent with hepatofugal flow noted within the mid and distal portions of the stent.    Echocardiogram 07/16/23 IMPRESSIONS   1. Left ventricular ejection fraction, by estimation, is 60 to 65%. Left  ventricular ejection fraction by 3D volume is 59 %. The left ventricle has  normal function. The left ventricle has no regional wall motion  abnormalities. The left ventricular  internal cavity size was mildly dilated. Left ventricular diastolic  parameters are consistent with Grade I diastolic dysfunction (impaired  relaxation). The average left ventricular global longitudinal strain is  -22.5 %. The global longitudinal strain is  normal.   2. Right ventricular systolic function is low normal. The right  ventricular size is mildly enlarged. There is normal pulmonary artery  systolic pressure. The estimated right ventricular systolic pressure is  28.1 mmHg.   3. Left atrial size was moderately dilated.   4. Right atrial size was moderately dilated.   5. The mitral valve is degenerative. Mild mitral valve regurgitation. No  evidence of mitral stenosis.   6. The aortic  valve is calcified. There is moderate calcification of the  aortic valve. There is moderate thickening of the aortic valve. Aortic  valve regurgitation is not visualized. Mild aortic valve stenosis. Aortic  valve area, by VTI measures 1.54  cm. Aortic valve mean gradient measures 18.8 mmHg. Aortic valve Vmax  measures 2.92 m/s.   7. The inferior vena cava is dilated in size with >50% respiratory  variability, suggesting right atrial pressure of 8 mmHg.    Comparison(s): 2022 AV mean gradient 17 mmHg.      CT AP 03/31/24  Patent TIPS. Persistently occluded GEVs.  Labs:  CBC: Recent Labs    09/19/23 1617 09/20/23 0121 09/20/23 0242 03/06/24 0000  WBC 6.2 6.7 6.2 6.6  HGB 12.5* 11.9* 11.7* 11.4*  HCT 36.1* 34.0* 34.1* 33.7*  PLT 133* 100* 100* 115*    COAGS: Recent Labs    08/28/23 1107 08/29/23 0625 09/19/23 1418 03/06/24 0000  INR 1.2 1.3* 1.3* 1.1  APTT  --   --  23*  --     BMP: Recent Labs    08/29/23 0625 09/12/23 1246 09/19/23 1418 09/20/23 0121 11/08/23 1058 02/25/24 1149 03/06/24 0000  NA 135 141 142 140 141 139 138  K 4.2 3.7 4.1 3.4* 3.4* 3.8 4.3  CL 103 109 110 109 108 106 107  CO2 21* 23 22 21* 26 25 25   GLUCOSE 103* 104* 97 94 99 87 93  BUN 10 13 9 9 9 15 12   CALCIUM 9.0 9.2 9.5 9.1 8.8 9.1 9.1  CREATININE 0.88 1.10 1.03 0.95 0.97 1.12 1.17  GFRNONAA >60 >60 >60 >60  --   --   --     LIVER FUNCTION TESTS: Recent Labs    08/29/23 0625 09/12/23 1246 09/19/23 1418 09/20/23 0121 11/08/23 1058 02/25/24 1149 03/06/24 0000  BILITOT 1.1 1.6* 1.8* 2.0* 1.4* 1.7* 1.5*  AST 26 32 41 35 30 39* 34  ALT 17 18 23 20 16 19 21   ALKPHOS 55 79 65 58  --   --   --   PROT 6.2* 7.0 6.9 6.3* 5.8* 5.9* 5.9*  ALBUMIN  3.1* 3.4* 3.6 3.1*  --   --   --     Assessment and Plan: 74 year old male with a history of compensated MASH cirrhosis (CP A6, MELD 7) with portal hypertension 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 On 08/28/23 he underwent TIPS creation with embolization of the left gastric and short gastric veins as well as coil-assisted retrograde transvenous obliteration of gastrorenal shunt and varices.  He continues to struggle some with hepatic encephalopathy in the setting of lactulose  non-compliance.  We discussed some strategies to maintain compliance.  He also has persistent lower extremity edema which bothers him.  I believe its best we obtain a CT  of the abdomen and pelvis to evaluate TIPS patency and shunt/variceal occlusion.  Additionally, he has not had any labs drawn since August 2025, so updating these may help us  determine if TIPS revision, additional embolization, or changes to diuretics may be necessary.  -obtain CTA abdomen/pelvis BRTO protocol -obtain CBC, CMP, and INR labs -follow up in IR clinic once obtained for further discussion  Ester Sides, MD Pager: [phone] Clinic: [phone]    I spent a total of 25 Minutes in face to face in clinical consultation, greater than 50% of which was counseling/coordinating care for portal hypertension.      "

## 2024-08-12 NOTE — Progress Notes (Signed)
 Ricky Rios                                          MRN: [id number]   08/12/2024   The [facility] Quality Team Specialist reviewed this patient medical record for the purposes of chart review for care gap closure. The following were reviewed: chart review for care gap closure-diabetic eye exam.    [facility] Quality Team

## 2024-08-13 ENCOUNTER — Inpatient Hospital Stay
Admission: RE | Admit: 2024-08-13 | Discharge: 2024-08-13 | Disposition: A | Discharge status: Home / Self Care | Source: Ambulatory Visit | Attending: Interventional Radiology | Admitting: Interventional Radiology

## 2024-08-13 ENCOUNTER — Ambulatory Visit
Admission: RE | Admit: 2024-08-13 | Discharge: 2024-08-13 | Disposition: A | Source: Ambulatory Visit | Attending: Interventional Radiology

## 2024-08-13 DIAGNOSIS — Z95828 Presence of other vascular implants and grafts: Secondary | ICD-10-CM

## 2024-08-13 HISTORY — PX: IR RADIOLOGIST EVAL & MGMT: IMG5224

## 2024-08-14 ENCOUNTER — Other Ambulatory Visit: Payer: Self-pay | Admitting: Interventional Radiology

## 2024-08-14 ENCOUNTER — Other Ambulatory Visit: Payer: Self-pay | Admitting: *Deleted

## 2024-08-14 DIAGNOSIS — K766 Portal hypertension: Secondary | ICD-10-CM

## 2024-08-15 ENCOUNTER — Ambulatory Visit
Admission: RE | Admit: 2024-08-15 | Discharge: 2024-08-15 | Disposition: A | Source: Ambulatory Visit | Attending: Interventional Radiology

## 2024-08-15 DIAGNOSIS — K766 Portal hypertension: Secondary | ICD-10-CM

## 2024-08-15 MED ORDER — IOPAMIDOL (ISOVUE-370) INJECTION 76%
100.0000 mL | Freq: Once | INTRAVENOUS | Status: AC | PRN
  Administered 2024-08-15: 100 mL via INTRAVENOUS

## 2024-08-16 LAB — COMPREHENSIVE METABOLIC PANEL WITH GFR
AG Ratio: 1.4 (calc) (ref 1.0–2.5)
ALT: 17 U/L (ref 9–46)
AST: 27 U/L (ref 10–35)
Albumin: 3.4 g/dL — ABNORMAL LOW (ref 3.6–5.1)
Alkaline phosphatase (APISO): 90 U/L (ref 35–144)
BUN/Creatinine Ratio: 12 (calc) (ref 6–22)
BUN: 15 mg/dL (ref 7–25)
CO2: 27 mmol/L (ref 20–32)
Calcium: 9.1 mg/dL (ref 8.6–10.3)
Chloride: 104 mmol/L (ref 98–110)
Creat: 1.3 mg/dL — ABNORMAL HIGH (ref 0.70–1.28)
Globulin: 2.5 g/dL (ref 1.9–3.7)
Glucose, Bld: 87 mg/dL (ref 65–139)
Potassium: 4.1 mmol/L (ref 3.5–5.3)
Sodium: 138 mmol/L (ref 135–146)
Total Bilirubin: 1.5 mg/dL — ABNORMAL HIGH (ref 0.2–1.2)
Total Protein: 5.9 g/dL — ABNORMAL LOW (ref 6.1–8.1)
eGFR: 58 mL/min/{1.73_m2} — ABNORMAL LOW

## 2024-08-16 LAB — CBC
HCT: 33.6 % — ABNORMAL LOW (ref 39.4–51.1)
Hemoglobin: 11.4 g/dL — ABNORMAL LOW (ref 13.2–17.1)
MCH: 31.9 pg (ref 27.0–33.0)
MCHC: 33.9 g/dL (ref 31.6–35.4)
MCV: 94.1 fL (ref 81.4–101.7)
MPV: 10.9 fL (ref 7.5–12.5)
Platelets: 88 10*3/uL — ABNORMAL LOW (ref 140–400)
RBC: 3.57 Million/uL — ABNORMAL LOW (ref 4.20–5.80)
RDW: 15.5 % — ABNORMAL HIGH (ref 11.0–15.0)
WBC: 5.4 10*3/uL (ref 3.8–10.8)

## 2024-08-16 LAB — PROTIME-INR
INR: 1.1
Prothrombin Time: 11.8 s — ABNORMAL HIGH (ref 9.0–11.5)

## 2024-08-21 NOTE — Progress Notes (Shared)
 "    This encounter was conducted via the [employer] providing interactive audio and visual communication.  The patient provided verbal consent to conduct a virtual appointment.  The patient was located at their primary residence during this encounter.  Referring Physician(s): Eartha Angelia Sieving   Chief Complaint: The patient is seen in virtual video follow up today s/p TIPS 08/28/23  History of present illness:  Ricky Rios, 74 year old male, has a medical history significant for DM, COPD, HTN, sleep apnea, obesity and cirrhosis with portal hypertension. 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 A CTA abdomen/pelvis also revealed a large gastric varix with gastrorenal shunt. On 08/28/23 he underwent TIPS creation with embolization of the left gastric and short gastric veins as well as coil-assisted retrograde transvenous obliteration of gastrorenal shunt and varices.   He was unfortunately briefly re-hospitalized at the end of February with HE due to lactulose  non-compliance. Imaging obtained during that hospitalization showed a patent TIPS shunt with a significant decrease in velocity. An area of thrombus was also identified in the left and right intrahepatic portal veins. He was started on 81 mg aspirin. He was later started on Rifaximin  for hepatic encephalopathy.   He has followed up with me and his GI team several times since then and despite some medication adjustments he has struggled with persistent lower extremity edema and intermittent bouts of hepatic encephalopathy.   At our last follow up visit 08/13/24 he endorsed some non-compliance with lactulose  primarily due to being able to control loose bowel movements and his fear of not making it to the bathroom. We discussed some strategies to maintain compliance. He reported intermittent episodes of hepatic encephalopathy and  complained of lower extremity swelling.  I recommended a CTA abdomen/pelvis to evaluate TIPS patency and shunt/variceal occlusion. I also recommended updated labs since he had not had any drawn since August 2025. His CTA study and labwork was completed 08/15/24.   He presents to the clinic today via virtual video visit to review these results.   Past Medical History:  Diagnosis Date   Allergy    Phreesia 03/01/2020   Arthritis    Phreesia 03/01/2020   Cataract    Phreesia 03/01/2020   Chest pain    Diagnostic cardiac cath October 2012. Normal coronary arteries, left ventricular function   COPD (chronic obstructive pulmonary disease) (HCC)    Phreesia 03/01/2020   DDD (degenerative disc disease), lumbar    Diabetes mellitus    Diabetes mellitus without complication (HCC)    Phreesia 03/01/2020   Heart murmur    History of back surgery    History of neck surgery    HLD (hyperlipidemia) 12/09/2012   Hyperlipidemia    Phreesia 03/01/2020   Hypertension    Obesity    Right knee injury    Sleep apnea    Smoker     Past Surgical History:  Procedure Laterality Date   APPENDECTOMY     BACK SURGERY     for ruptured disc lumbar area   BIOPSY  06/01/2023   Procedure: BIOPSY;  Surgeon: Eartha Angelia Sieving, MD;  Location: [facility];  Service: Gastroenterology;;   CARDIAC CATHETERIZATION  Oct 2012   nml coronary arteries, aorta, LV fcn   COLONOSCOPY N/A 03/27/2024   Procedure: COLONOSCOPY;  Surgeon: Eartha Angelia Sieving, MD;  Location: [facility];  Service: Gastroenterology;  Laterality: N/A;  8:30 am, asa 3  COLONOSCOPY WITH PROPOFOL  N/A 01/31/2023   Procedure: COLONOSCOPY WITH PROPOFOL ;  Surgeon: Eartha Angelia Sieving, MD;  Location: [facility];  Service: Gastroenterology;  Laterality: N/A;  12:45pm;ASA 1-2   ESOPHAGOGASTRODUODENOSCOPY (EGD) WITH PROPOFOL  N/A 06/01/2023   Procedure: ESOPHAGOGASTRODUODENOSCOPY (EGD) WITH PROPOFOL ;  Surgeon: Eartha Angelia Sieving, MD;  Location: [facility];  Service: Gastroenterology;  Laterality: N/A;  9:15AM;ASA 3   EYE SURGERY N/A    Phreesia 03/01/2020   HEMOSTASIS CLIP PLACEMENT  01/31/2023   Procedure: HEMOSTASIS CLIP PLACEMENT;  Surgeon: Eartha Angelia Sieving, MD;  Location: [facility];  Service: Gastroenterology;;   I & D EXTREMITY Right 02/06/2013   Procedure: IRRIGATION AND DEBRIDEMENT INDEX FINGER;  Surgeon: Franky JONELLE Curia, MD;  Location: [facility];  Service: Orthopedics;  Laterality: Right;   IR ANGIOGRAM SELECTIVE EACH ADDITIONAL VESSEL  08/28/2023   IR ANGIOGRAM SELECTIVE EACH ADDITIONAL VESSEL  08/28/2023   IR EMBO ART  VEN HEMORR LYMPH EXTRAV  INC GUIDE ROADMAPPING  08/28/2023   IR EMBO ART  VEN HEMORR LYMPH EXTRAV  INC GUIDE ROADMAPPING  08/28/2023   IR EMBO VENOUS NOT HEMORR HEMANG  INC GUIDE ROADMAPPING  08/28/2023   IR INTRAVASCULAR ULTRASOUND NON CORONARY  08/28/2023   IR RADIOLOGIST EVAL & MGMT  06/20/2023   IR RADIOLOGIST EVAL & MGMT  08/10/2023   IR RADIOLOGIST EVAL & MGMT  09/24/2023   IR RADIOLOGIST EVAL & MGMT  04/07/2024   IR RADIOLOGIST EVAL & MGMT  08/13/2024   IR TIPS  08/28/2023   IR US  GUIDE VASC ACCESS RIGHT  08/28/2023   IR US  GUIDE VASC ACCESS RIGHT  08/28/2023   NECK SURGERY     for ruptured disc   POLYPECTOMY  01/31/2023   Procedure: POLYPECTOMY;  Surgeon: Eartha Angelia Sieving, MD;  Location: [facility];  Service: Gastroenterology;;   SPINE SURGERY N/A    Phreesia 03/01/2020   TIPS PROCEDURE N/A 08/28/2023   Procedure: TRANS-JUGULAR INTRAHEPATIC PORTAL SHUNT (TIPS);  Surgeon: Jennefer Ester PARAS, MD;  Location: [facility];  Service: Radiology;  Laterality: N/A;   TONSILLECTOMY      Allergies: Lipitor [atorvastatin calcium], Sulfa antibiotics, and Zocor [simvastatin - high dose]  Medications: Prior to Admission medications  Medication Sig Start Date End Date Taking? Authorizing Provider  acetaminophen  (TYLENOL ) 500 MG tablet Take 1,000 mg by mouth every 8 (eight) hours as needed for  moderate pain.    [provider]  albuterol  (VENTOLIN  HFA) 108 (90 Base) MCG/ACT inhaler Inhale 1-2 puffs into the lungs every 4 (four) hours as needed for wheezing or shortness of breath.    [provider]  aspirin EC 81 MG tablet Take 81 mg by mouth daily. Swallow whole.    [provider]  Clobetasol Propionate 0.05 % lotion Apply 1 Application topically as needed (psoriasis). 11/24/22   [provider]  escitalopram (LEXAPRO) 10 MG tablet Take 10 mg by mouth daily.    [provider]  ezetimibe  (ZETIA ) 10 MG tablet TAKE 1 TABLET EVERY DAY 08/30/21   Duanne Butler DASEN, MD  furosemide  (LASIX ) 20 MG tablet Take 1 tablet by mouth once daily 07/11/24   Carlan, Chelsea L, NP  ipratropium (ATROVENT ) 0.03 % nasal spray Place 1-2 sprays into both nostrils at bedtime. Patient taking differently: Place 1-2 sprays into both nostrils at bedtime. Taking as needed for shortness of breath 08/16/23   [provider]  lactulose  (CHRONULAC ) 10 GM/15ML solution Take 30 mLs (20 g total) by mouth  4 (four) times daily. 12/31/23 12/30/24  Covington, Jamie R, NP  omeprazole  (PRILOSEC) 40 MG capsule Take 1 capsule by mouth once daily 05/26/24   Carlan, Chelsea L, NP  polyethylene glycol (MIRALAX / GLYCOLAX) 17 g packet Take 17 g by mouth 2 (two) times daily.    [provider]  potassium chloride  SA (KLOR-CON  M) 20 MEQ tablet Take 20 mEq by mouth once.    [provider]  rifaximin  (XIFAXAN ) 550 MG TABS tablet Take 1 tablet (550 mg total) by mouth 2 (two) times daily. 07/22/24   Neita Kirsch A, PA-C  spironolactone  (ALDACTONE ) 50 MG tablet Take 1 tablet by mouth once daily 06/18/24   Carlan, Chelsea L, NP  vitamin E  180 MG (400 UNITS) capsule Take 400 Units by mouth daily.    [provider]  zinc  gluconate 50 MG tablet Take 50 mg by mouth daily.    [provider]     Family History  Problem Relation Age of Onset   Dementia  Mother    Heart disease Father     Social History   Socioeconomic History   Marital status: Married    Spouse name: Vernell   Number of children: 2   Years of education: Not on file   Highest education level: Not on file  Occupational History   Occupation: Disabled    Employer: [employer]  Tobacco Use   Smoking status: Every Day    Current packs/day: 2.00    Types: Cigarettes   Smokeless tobacco: Never   Tobacco comments:    Vaping more then cigs  Vaping Use   Vaping status: Former   Quit date: 07/24/2021  Substance and Sexual Activity   Alcohol use: No   Drug use: No   Sexual activity: Not on file  Other Topics Concern   Not on file  Social History Narrative   1 step-son. 1 daughter.    4 grandchildren.   Social Drivers of Health   Tobacco Use: High Risk (05/27/2024)   Patient History    Smoking Tobacco Use: Every Day    Smokeless Tobacco Use: Never    Passive Exposure: Not on file  Financial Resource Strain: Patient Declined (04/21/2024)   Overall Financial Resource Strain (CARDIA)    Difficulty of Paying Living Expenses: Patient declined  Food Insecurity: No Food Insecurity (04/21/2024)   Epic    Worried About Programme Researcher, Broadcasting/film/video in the Last Year: Never true    Ran Out of Food in the Last Year: Never true  Transportation Needs: No Transportation Needs (04/21/2024)   Epic    Lack of Transportation (Medical): No    Lack of Transportation (Non-Medical): No  Physical Activity: Not on file  Stress: Not on file  Social Connections: Socially Integrated (08/28/2023)   Social Connection and Isolation Panel    Frequency of Communication with Friends and Family: More than three times a week    Frequency of Social Gatherings with Friends and Family: Once a week    Attends Religious Services: 1 to 4 times per year    Active Member of Clubs or Organizations: Yes    Attends Banker Meetings: 1 to 4 times per year    Marital Status: Married  Depression  (PHQ2-9): Low Risk (04/21/2024)   Depression (PHQ2-9)    PHQ-2 Score: 1  Alcohol Screen: Not on file  Housing: Low Risk (04/21/2024)   Epic    Unable to Pay for Housing in the  Last Year: No    Number of Times Moved in the Last Year: 0    Homeless in the Last Year: No  Utilities: Not At Risk (04/21/2024)   Epic    Threatened with loss of utilities: No  Health Literacy: Not on file     Vital Signs: There were no vitals taken for this visit.  Physical Exam  Patient is alert, oriented and able to participate fully in the conversation. No apparent discomfort or distress observed. He appears appropriately dressed.    Imaging:  CT Chest 02/16/23   Cirrhosis, splenomegaly, suggestion of large gastric varices with gastrorenal shunt.   US  Hepatic Doppler (05/21/23) IMPRESSION: Directed duplex of the hepatic vasculature is unremarkable    CTA AP 07/12/23  Patent portal system    Large gastric varix draining via left renal vein   CT Abdomen/Pelvis 09/20/23        US  Hepatic Duplex 10/26/23 IMPRESSION: Patent TIPS stent with hepatofugal flow noted within the mid and distal portions of the stent.    Echocardiogram 07/16/23 IMPRESSIONS   1. Left ventricular ejection fraction, by estimation, is 60 to 65%. Left  ventricular ejection fraction by 3D volume is 59 %. The left ventricle has  normal function. The left ventricle has no regional wall motion  abnormalities. The left ventricular  internal cavity size was mildly dilated. Left ventricular diastolic  parameters are consistent with Grade I diastolic dysfunction (impaired  relaxation). The average left ventricular global longitudinal strain is  -22.5 %. The global longitudinal strain is  normal.   2. Right ventricular systolic function is low normal. The right  ventricular size is mildly enlarged. There is normal pulmonary artery  systolic pressure. The estimated right ventricular systolic pressure is  28.1 mmHg.   3.  Left atrial size was moderately dilated.   4. Right atrial size was moderately dilated.   5. The mitral valve is degenerative. Mild mitral valve regurgitation. No  evidence of mitral stenosis.   6. The aortic valve is calcified. There is moderate calcification of the  aortic valve. There is moderate thickening of the aortic valve. Aortic  valve regurgitation is not visualized. Mild aortic valve stenosis. Aortic  valve area, by VTI measures 1.54  cm. Aortic valve mean gradient measures 18.8 mmHg. Aortic valve Vmax  measures 2.92 m/s.   7. The inferior vena cava is dilated in size with >50% respiratory  variability, suggesting right atrial pressure of 8 mmHg.   Comparison(s): 2022 AV mean gradient 17 mmHg.      CT AP 03/31/24  Patent TIPS. Persistently occluded GEVs.   CTA Abdomen/Pelvis 08/15/24  1. Patent indwelling TIPS endograft and similar appearing postprocedural changes after gastric vein and gastro renal shunt coil embolization without evidence of significant residual gastric varices.  Labs:  CBC: Recent Labs    09/20/23 0121 09/20/23 0242 03/06/24 0000 08/15/24 1552  WBC 6.7 6.2 6.6 5.4  HGB 11.9* 11.7* 11.4* 11.4*  HCT 34.0* 34.1* 33.7* 33.6*  PLT 100* 100* 115* 88*    COAGS: Recent Labs    08/29/23 0625 09/19/23 1418 03/06/24 0000 08/15/24 1552  INR 1.3* 1.3* 1.1 1.1  APTT  --  23*  --   --     BMP: Recent Labs    08/29/23 0625 09/12/23 1246 09/19/23 1418 09/20/23 0121 11/08/23 1058 02/25/24 1149 03/06/24 0000 08/15/24 1552  NA 135 141 142 140 141 139 138 138  K 4.2 3.7 4.1 3.4* 3.4* 3.8 4.3 4.1  CL 103 109 110 109 108 106 107 104  CO2 21* 23 22 21* 26 25 25 27   GLUCOSE 103* 104* 97 94 99 87 93 87  BUN 10 13 9 9 9 15 12 15   CALCIUM 9.0 9.2 9.5 9.1 8.8 9.1 9.1 9.1  CREATININE 0.88 1.10 1.03 0.95 0.97 1.12 1.17 1.30*  GFRNONAA >60 >60 >60 >60  --   --   --   --     LIVER FUNCTION TESTS: Recent Labs    08/29/23 0625 09/12/23 1246  09/19/23 1418 09/20/23 0121 11/08/23 1058 02/25/24 1149 03/06/24 0000 08/15/24 1552  BILITOT 1.1 1.6* 1.8* 2.0* 1.4* 1.7* 1.5* 1.5*  AST 26 32 41 35 30 39* 34 27  ALT 17 18 23 20 16 19 21 17   ALKPHOS 55 79 65 58  --   --   --   --   PROT 6.2* 7.0 6.9 6.3* 5.8* 5.9* 5.9* 5.9*  ALBUMIN  3.1* 3.4* 3.6 3.1*  --   --   --   --     Assessment and Plan:  74 year old male with a history of compensated MASH cirrhosis (CP A6, MELD 7) with portal hypertension 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 On 08/28/23 he underwent TIPS creation with embolization of the left gastric and short gastric veins as well as coil-assisted retrograde transvenous obliteration of gastrorenal shunt and varices. He struggles some with hepatic encephalopathy in the setting of lactulose  non-compliance and he also has persistent lower extremity edema.   Ester Sides, MD Pager: [phone]    I spent a total of 25 Minutes in virtual video clinical consultation, greater than 50% of which was counseling/coordinating care for portal hypertension.      "

## 2024-08-25 ENCOUNTER — Inpatient Hospital Stay
Admission: RE | Admit: 2024-08-25 | Discharge: 2024-08-25 | Disposition: A | Source: Ambulatory Visit | Attending: Interventional Radiology

## 2024-08-25 DIAGNOSIS — K766 Portal hypertension: Secondary | ICD-10-CM

## 2024-08-26 ENCOUNTER — Other Ambulatory Visit: Payer: Self-pay | Admitting: Interventional Radiology

## 2024-08-26 DIAGNOSIS — R911 Solitary pulmonary nodule: Secondary | ICD-10-CM

## 2024-09-04 ENCOUNTER — Ambulatory Visit (INDEPENDENT_AMBULATORY_CARE_PROVIDER_SITE_OTHER): Admitting: Gastroenterology

## 2024-09-17 ENCOUNTER — Ambulatory Visit: Admitting: Emergency Medicine
